# Patient Record
Sex: Male | Born: 1940 | Race: White | Hispanic: No | Marital: Married | Smoking: Former smoker
Health system: Southern US, Community
[De-identification: ages and names within clinical notes are randomized; demographics above are authoritative.]

## PROBLEM LIST (undated history)

## (undated) DIAGNOSIS — S81802A Unspecified open wound, left lower leg, initial encounter: Secondary | ICD-10-CM

## (undated) DIAGNOSIS — I10 Essential (primary) hypertension: Secondary | ICD-10-CM

## (undated) DIAGNOSIS — E785 Hyperlipidemia, unspecified: Secondary | ICD-10-CM

## (undated) DIAGNOSIS — J449 Chronic obstructive pulmonary disease, unspecified: Secondary | ICD-10-CM

## (undated) DIAGNOSIS — R0602 Shortness of breath: Secondary | ICD-10-CM

## (undated) DIAGNOSIS — D65 Disseminated intravascular coagulation [defibrination syndrome]: Secondary | ICD-10-CM

## (undated) DIAGNOSIS — I739 Peripheral vascular disease, unspecified: Secondary | ICD-10-CM

## (undated) DIAGNOSIS — I714 Abdominal aortic aneurysm, without rupture, unspecified: Secondary | ICD-10-CM

## (undated) DIAGNOSIS — J95821 Acute postprocedural respiratory failure: Secondary | ICD-10-CM

## (undated) HISTORY — DX: Essential (primary) hypertension: I10

## (undated) HISTORY — DX: Unspecified open wound, left lower leg, initial encounter: S81.802A

## (undated) HISTORY — DX: Hyperlipidemia, unspecified: E78.5

## (undated) HISTORY — DX: Acute postprocedural respiratory failure: J95.821

## (undated) HISTORY — PX: GANGLION CYST EXCISION: SHX1691

## (undated) HISTORY — DX: Abdominal aortic aneurysm, without rupture, unspecified: I71.40

## (undated) HISTORY — DX: Abdominal aortic aneurysm, without rupture: I71.4

## (undated) HISTORY — DX: Disseminated intravascular coagulation (defibrination syndrome): D65

---

## 2012-09-08 ENCOUNTER — Inpatient Hospital Stay (HOSPITAL_COMMUNITY)
Admission: EM | Admit: 2012-09-08 | Discharge: 2012-09-15 | DRG: 237 | Disposition: A | Discharge status: Rehabilitation Facility | Payer: Medicare Other | Attending: Vascular Surgery | Admitting: Vascular Surgery

## 2012-09-08 ENCOUNTER — Encounter (HOSPITAL_COMMUNITY): Admission: EM | Disposition: A | Discharge status: Rehabilitation Facility | Payer: Self-pay | Source: Home / Self Care

## 2012-09-08 ENCOUNTER — Encounter (HOSPITAL_COMMUNITY): Payer: Self-pay | Admitting: Vascular Surgery

## 2012-09-08 ENCOUNTER — Encounter (HOSPITAL_COMMUNITY): Payer: Self-pay | Admitting: Anesthesiology

## 2012-09-08 ENCOUNTER — Ambulatory Visit: Payer: Medicare Other | Admitting: Family Medicine

## 2012-09-08 ENCOUNTER — Emergency Department (HOSPITAL_COMMUNITY): Payer: Medicare Other

## 2012-09-08 ENCOUNTER — Inpatient Hospital Stay (HOSPITAL_COMMUNITY): Payer: Medicare Other

## 2012-09-08 ENCOUNTER — Emergency Department (HOSPITAL_COMMUNITY): Payer: Medicare Other | Admitting: Anesthesiology

## 2012-09-08 VITALS — BP 115/85 | HR 80

## 2012-09-08 DIAGNOSIS — Z87891 Personal history of nicotine dependence: Secondary | ICD-10-CM | POA: Insufficient documentation

## 2012-09-08 DIAGNOSIS — R9431 Abnormal electrocardiogram [ECG] [EKG]: Secondary | ICD-10-CM

## 2012-09-08 DIAGNOSIS — E876 Hypokalemia: Secondary | ICD-10-CM | POA: Diagnosis not present

## 2012-09-08 DIAGNOSIS — I713 Abdominal aortic aneurysm, ruptured, unspecified: Principal | ICD-10-CM | POA: Diagnosis present

## 2012-09-08 DIAGNOSIS — D62 Acute posthemorrhagic anemia: Secondary | ICD-10-CM | POA: Diagnosis not present

## 2012-09-08 DIAGNOSIS — J449 Chronic obstructive pulmonary disease, unspecified: Secondary | ICD-10-CM

## 2012-09-08 DIAGNOSIS — R5381 Other malaise: Secondary | ICD-10-CM | POA: Diagnosis not present

## 2012-09-08 DIAGNOSIS — Z720 Tobacco use: Secondary | ICD-10-CM

## 2012-09-08 DIAGNOSIS — R55 Syncope and collapse: Secondary | ICD-10-CM

## 2012-09-08 DIAGNOSIS — I743 Embolism and thrombosis of arteries of the lower extremities: Secondary | ICD-10-CM

## 2012-09-08 DIAGNOSIS — D696 Thrombocytopenia, unspecified: Secondary | ICD-10-CM | POA: Diagnosis not present

## 2012-09-08 DIAGNOSIS — J4489 Other specified chronic obstructive pulmonary disease: Secondary | ICD-10-CM | POA: Diagnosis present

## 2012-09-08 DIAGNOSIS — D65 Disseminated intravascular coagulation [defibrination syndrome]: Secondary | ICD-10-CM | POA: Diagnosis not present

## 2012-09-08 DIAGNOSIS — J95821 Acute postprocedural respiratory failure: Secondary | ICD-10-CM | POA: Diagnosis not present

## 2012-09-08 DIAGNOSIS — R7309 Other abnormal glucose: Secondary | ICD-10-CM | POA: Diagnosis not present

## 2012-09-08 DIAGNOSIS — F17200 Nicotine dependence, unspecified, uncomplicated: Secondary | ICD-10-CM | POA: Diagnosis present

## 2012-09-08 DIAGNOSIS — R739 Hyperglycemia, unspecified: Secondary | ICD-10-CM | POA: Diagnosis not present

## 2012-09-08 DIAGNOSIS — R109 Unspecified abdominal pain: Secondary | ICD-10-CM

## 2012-09-08 DIAGNOSIS — I959 Hypotension, unspecified: Secondary | ICD-10-CM

## 2012-09-08 DIAGNOSIS — I739 Peripheral vascular disease, unspecified: Secondary | ICD-10-CM | POA: Diagnosis present

## 2012-09-08 DIAGNOSIS — I10 Essential (primary) hypertension: Secondary | ICD-10-CM | POA: Diagnosis present

## 2012-09-08 HISTORY — PX: ABDOMINAL AORTIC ANEURYSM REPAIR: SHX42

## 2012-09-08 LAB — COMPREHENSIVE METABOLIC PANEL
ALT: 6 U/L (ref 0–53)
AST: 8 U/L (ref 0–37)
Albumin: 2.8 g/dL — ABNORMAL LOW (ref 3.5–5.2)
Alkaline Phosphatase: 47 U/L (ref 39–117)
BUN: 14 mg/dL (ref 6–23)
CO2: 21 mEq/L (ref 19–32)
Calcium: 7.9 mg/dL — ABNORMAL LOW (ref 8.4–10.5)
Chloride: 106 mEq/L (ref 96–112)
Creatinine, Ser: 0.74 mg/dL (ref 0.50–1.35)
GFR calc Af Amer: >90 mL/min (ref >90)
GFR calc non Af Amer: >90 mL/min (ref >90)
Glucose, Bld: 170 mg/dL — ABNORMAL HIGH (ref 70–99)
Potassium: 3.6 mEq/L (ref 3.5–5.1)
Sodium: 136 mEq/L (ref 135–145)
Total Bilirubin: 0.4 mg/dL (ref 0.3–1.2)
Total Protein: 4.9 g/dL — ABNORMAL LOW (ref 6.0–8.3)

## 2012-09-08 LAB — ABO/RH: ABO/RH(D): O POS

## 2012-09-08 LAB — POCT I-STAT 7, (LYTES, BLD GAS, ICA,H+H)
Acid-base deficit: 10 mmol/L — ABNORMAL HIGH (ref 0.0–2.0)
Acid-base deficit: 7 mmol/L — ABNORMAL HIGH (ref 0.0–2.0)
Bicarbonate: 17.1 mEq/L — ABNORMAL LOW (ref 20.0–24.0)
Bicarbonate: 20.3 mEq/L (ref 20.0–24.0)
Calcium, Ion: 0.94 mmol/L — ABNORMAL LOW (ref 1.13–1.30)
Calcium, Ion: 0.58 mmol/L — CL (ref 1.13–1.30)
HCT: 15 % — ABNORMAL LOW (ref 39.0–52.0)
HCT: 24 % — ABNORMAL LOW (ref 39.0–52.0)
Hemoglobin: 5.1 g/dL — CL (ref 13.0–17.0)
Hemoglobin: 8.2 g/dL — ABNORMAL LOW (ref 13.0–17.0)
O2 Saturation: 100 %
O2 Saturation: 100 %
Patient temperature: 33
Patient temperature: 33.2
Potassium: 3.9 mEq/L (ref 3.5–5.1)
Potassium: 4.7 mEq/L (ref 3.5–5.1)
Sodium: 137 mEq/L (ref 135–145)
Sodium: 140 mEq/L (ref 135–145)
TCO2: 18 mmol/L (ref 0–100)
TCO2: 22 mmol/L (ref 0–100)
pCO2 arterial: 36.7 mmHg (ref 35.0–45.0)
pCO2 arterial: 41.1 mmHg (ref 35.0–45.0)
pH, Arterial: 7.254 — ABNORMAL LOW (ref 7.350–7.450)
pH, Arterial: 7.282 — ABNORMAL LOW (ref 7.350–7.450)
pO2, Arterial: 336 mmHg — ABNORMAL HIGH (ref 80.0–100.0)
pO2, Arterial: 362 mmHg — ABNORMAL HIGH (ref 80.0–100.0)

## 2012-09-08 LAB — PROTIME-INR
INR: 1.43 (ref 0.00–1.49)
INR: 3.07 — ABNORMAL HIGH (ref 0.00–1.49)
Prothrombin Time: 17.1 seconds — ABNORMAL HIGH (ref 11.6–15.2)
Prothrombin Time: 30.1 seconds — ABNORMAL HIGH (ref 11.6–15.2)

## 2012-09-08 LAB — CBC WITH DIFFERENTIAL/PLATELET
Basophils Absolute: 0 10*3/uL (ref 0.0–0.1)
Basophils Relative: 0 % (ref 0–1)
Eosinophils Absolute: 0 10*3/uL (ref 0.0–0.7)
Eosinophils Relative: 0 % (ref 0–5)
HCT: 33.3 % — ABNORMAL LOW (ref 39.0–52.0)
Hemoglobin: 11.4 g/dL — ABNORMAL LOW (ref 13.0–17.0)
Lymphocytes Relative: 5 % — ABNORMAL LOW (ref 12–46)
Lymphs Abs: 1.3 10*3/uL (ref 0.7–4.0)
MCH: 32.9 pg (ref 26.0–34.0)
MCHC: 34.2 g/dL (ref 30.0–36.0)
MCV: 96 fL (ref 78.0–100.0)
Monocytes Absolute: 1 10*3/uL (ref 0.1–1.0)
Monocytes Relative: 4 % (ref 3–12)
Neutro Abs: 23.6 10*3/uL — ABNORMAL HIGH (ref 1.7–7.7)
Neutrophils Relative %: 91 % — ABNORMAL HIGH (ref 43–77)
Platelets: 160 10*3/uL (ref 150–400)
RBC: 3.47 MIL/uL — ABNORMAL LOW (ref 4.22–5.81)
RDW: 13.5 % (ref 11.5–15.5)
WBC: 25.9 10*3/uL — ABNORMAL HIGH (ref 4.0–10.5)

## 2012-09-08 LAB — POCT CBC
Granulocyte percent: 76.3 %G (ref 37–80)
HCT, POC: 43.4 % — AB (ref 43.5–53.7)
Hemoglobin: 13.6 g/dL — AB (ref 14.1–18.1)
Lymph, poc: 2.9 (ref 0.6–3.4)
MCH, POC: 31.2 pg (ref 27–31.2)
MCHC: 31.3 g/dL — AB (ref 31.8–35.4)
MCV: 99.6 fL — AB (ref 80–97)
MID (cbc): 1 — AB (ref 0–0.9)
MPV: 9.8 fL (ref 0–99.8)
POC Granulocyte: 12.6 — AB (ref 2–6.9)
POC LYMPH PERCENT: 17.6 %L (ref 10–50)
POC MID %: 6.1 %M (ref 0–12)
Platelet Count, POC: 220 10*3/uL (ref 142–424)
RBC: 4.36 M/uL — AB (ref 4.69–6.13)
RDW, POC: 14.3 %
WBC: 16.5 10*3/uL — AB (ref 4.6–10.2)

## 2012-09-08 LAB — LACTIC ACID, PLASMA: Lactic Acid, Venous: 3.5 mmol/L — ABNORMAL HIGH (ref 0.5–2.2)

## 2012-09-08 LAB — PLATELET COUNT: Platelets: 55 10*3/uL — ABNORMAL LOW (ref 150–400)

## 2012-09-08 LAB — GLUCOSE, POCT (MANUAL RESULT ENTRY): POC Glucose: 186 mg/dl — AB (ref 70–99)

## 2012-09-08 LAB — APTT
aPTT: 35 seconds (ref 24–37)
aPTT: 83 seconds — ABNORMAL HIGH (ref 24–37)

## 2012-09-08 LAB — POCT I-STAT TROPONIN I: Troponin i, poc: 0.02 ng/mL (ref 0.00–0.08)

## 2012-09-08 LAB — LIPASE, BLOOD: Lipase: 15 U/L (ref 11–59)

## 2012-09-08 LAB — PREPARE RBC (CROSSMATCH)

## 2012-09-08 LAB — FIBRINOGEN: Fibrinogen: 85 mg/dL — CL (ref 204–475)

## 2012-09-08 SURGERY — ANEURYSM ABDOMINAL AORTIC REPAIR
Anesthesia: General | Site: Abdomen | Wound class: Clean

## 2012-09-08 MED ORDER — HYDROMORPHONE HCL PF 1 MG/ML IJ SOLN
0.5000 mg | INTRAMUSCULAR | Status: DC | PRN
  Administered 2012-09-08: 0.5 mg via INTRAVENOUS
  Filled 2012-09-08: qty 1

## 2012-09-08 MED ORDER — MANNITOL 25 % IV SOLN
INTRAVENOUS | Status: DC | PRN
  Administered 2012-09-08: 25 g via INTRAVENOUS

## 2012-09-08 MED ORDER — ARTIFICIAL TEARS OP OINT
TOPICAL_OINTMENT | OPHTHALMIC | Status: DC | PRN
  Administered 2012-09-08: 1 via OPHTHALMIC

## 2012-09-08 MED ORDER — ONDANSETRON HCL 4 MG/2ML IJ SOLN
4.0000 mg | Freq: Once | INTRAMUSCULAR | Status: AC
  Administered 2012-09-08: 4 mg via INTRAVENOUS
  Filled 2012-09-08: qty 2

## 2012-09-08 MED ORDER — MIDAZOLAM HCL 5 MG/5ML IJ SOLN
INTRAMUSCULAR | Status: DC | PRN
  Administered 2012-09-08: 4 mg via INTRAVENOUS
  Administered 2012-09-08: 2 mg via INTRAVENOUS

## 2012-09-08 MED ORDER — FENTANYL CITRATE 0.05 MG/ML IJ SOLN
INTRAMUSCULAR | Status: DC | PRN
  Administered 2012-09-08: 150 ug via INTRAVENOUS
  Administered 2012-09-08: 100 ug via INTRAVENOUS
  Administered 2012-09-08 (×3): 250 ug via INTRAVENOUS
  Administered 2012-09-08: 150 ug via INTRAVENOUS
  Administered 2012-09-08: 250 ug via INTRAVENOUS
  Administered 2012-09-08: 100 ug via INTRAVENOUS

## 2012-09-08 MED ORDER — ROCURONIUM BROMIDE 100 MG/10ML IV SOLN
INTRAVENOUS | Status: DC | PRN
  Administered 2012-09-08: 20 mg via INTRAVENOUS
  Administered 2012-09-08: 30 mg via INTRAVENOUS

## 2012-09-08 MED ORDER — ALBUMIN HUMAN 5 % IV SOLN
INTRAVENOUS | Status: DC | PRN
  Administered 2012-09-08: 20:00:00 via INTRAVENOUS

## 2012-09-08 MED ORDER — SUCCINYLCHOLINE CHLORIDE 20 MG/ML IJ SOLN
INTRAMUSCULAR | Status: DC | PRN
  Administered 2012-09-08: 120 mg via INTRAVENOUS

## 2012-09-08 MED ORDER — LACTATED RINGERS IV SOLN
INTRAVENOUS | Status: DC | PRN
  Administered 2012-09-08: 20:00:00 via INTRAVENOUS

## 2012-09-08 MED ORDER — SODIUM CHLORIDE 0.9 % IV SOLN
1000.0000 mL | INTRAVENOUS | Status: DC
  Administered 2012-09-08: 1000 mL via INTRAVENOUS

## 2012-09-08 MED ORDER — SODIUM BICARBONATE 8.4 % IV SOLN
INTRAVENOUS | Status: DC | PRN
  Administered 2012-09-08: 100 meq via INTRAVENOUS
  Administered 2012-09-08 (×2): 50 meq via INTRAVENOUS
  Administered 2012-09-08: 100 meq via INTRAVENOUS

## 2012-09-08 MED ORDER — LIDOCAINE HCL (CARDIAC) 20 MG/ML IV SOLN
INTRAVENOUS | Status: DC | PRN
  Administered 2012-09-08: 100 mg via INTRAVENOUS

## 2012-09-08 MED ORDER — SODIUM CHLORIDE 0.9 % IR SOLN
Status: DC | PRN
  Administered 2012-09-08: 21:00:00

## 2012-09-08 MED ORDER — 0.9 % SODIUM CHLORIDE (POUR BTL) OPTIME
TOPICAL | Status: DC | PRN
  Administered 2012-09-08: 1000 mL

## 2012-09-08 MED ORDER — VECURONIUM BROMIDE 10 MG IV SOLR
INTRAVENOUS | Status: DC | PRN
  Administered 2012-09-08 (×2): 10 mg via INTRAVENOUS

## 2012-09-08 MED ORDER — MANNITOL 25 % IV SOLN
25.0000 g | INTRAVENOUS | Status: AC
  Administered 2012-09-08: 25 g via INTRAVENOUS
  Filled 2012-09-08: qty 100

## 2012-09-08 MED ORDER — CALCIUM CHLORIDE 10 % IV SOLN
INTRAVENOUS | Status: DC | PRN
  Administered 2012-09-08 (×2): 1 g via INTRAVENOUS
  Administered 2012-09-08: 2 g via INTRAVENOUS

## 2012-09-08 MED ORDER — PROPOFOL 10 MG/ML IV BOLUS
INTRAVENOUS | Status: DC | PRN
  Administered 2012-09-08 (×2): 200 mg via INTRAVENOUS
  Administered 2012-09-08 (×2): 100 mg via INTRAVENOUS

## 2012-09-08 MED ORDER — ALBUMIN HUMAN 5 % IV SOLN: INTRAVENOUS | Status: DC | PRN

## 2012-09-08 MED ORDER — SODIUM CHLORIDE 0.9 % IV SOLN
INTRAVENOUS | Status: DC | PRN
  Administered 2012-09-08: 21:00:00 via INTRAVENOUS

## 2012-09-08 MED ORDER — ETOMIDATE 2 MG/ML IV SOLN
INTRAVENOUS | Status: DC | PRN
  Administered 2012-09-08: 14 mg via INTRAVENOUS

## 2012-09-08 MED ORDER — PHENYLEPHRINE HCL 10 MG/ML IJ SOLN
10.0000 mg | INTRAVENOUS | Status: DC | PRN
  Administered 2012-09-08 (×2): 10 ug/min via INTRAVENOUS

## 2012-09-08 SURGICAL SUPPLY — 87 items
ATTRACTOMAT 16X20 MAGNETIC DRP (DRAPES) ×3 IMPLANT
BALLN CODA OCL 2-9.0-35-120-3 (BALLOONS)
BALLOON COD OCL 2-9.0-35-120-3 (BALLOONS) IMPLANT
BLADE SURG ROTATE 9660 (MISCELLANEOUS) ×3 IMPLANT
CANISTER SUCTION 2500CC (MISCELLANEOUS) ×3 IMPLANT
CATH EMB 4FR 80CM (CATHETERS) ×3 IMPLANT
CATH EMB 5FR 80CM (CATHETERS) ×3 IMPLANT
CLIP TI MEDIUM 24 (CLIP) ×3 IMPLANT
CLIP TI WIDE RED SMALL 24 (CLIP) ×3 IMPLANT
CLOTH BEACON ORANGE TIMEOUT ST (SAFETY) ×3 IMPLANT
COVER MAYO STAND STRL (DRAPES) ×3 IMPLANT
COVER SURGICAL LIGHT HANDLE (MISCELLANEOUS) ×3 IMPLANT
DERMABOND ADVANCED (GAUZE/BANDAGES/DRESSINGS) ×1
DERMABOND ADVANCED .7 DNX12 (GAUZE/BANDAGES/DRESSINGS) ×2 IMPLANT
DRAIN CHANNEL 10F 3/8 F FF (DRAIN) IMPLANT
DRAIN CHANNEL 10M FLAT 3/4 FLT (DRAIN) IMPLANT
DRAPE TABLE COVER HEAVY DUTY (DRAPES) ×3 IMPLANT
DRAPE WARM FLUID 44X44 (DRAPE) ×3 IMPLANT
DRSG COVADERM 4X14 (GAUZE/BANDAGES/DRESSINGS) ×3 IMPLANT
ELECT BLADE 6.5 EXT (BLADE) IMPLANT
ELECT CAUTERY BLADE 6.4 (BLADE) ×6 IMPLANT
ELECT REM PT RETURN 9FT ADLT (ELECTROSURGICAL) ×6
ELECTRODE REM PT RTRN 9FT ADLT (ELECTROSURGICAL) ×4 IMPLANT
EVACUATOR 3/16  PVC DRAIN (DRAIN)
EVACUATOR 3/16 PVC DRAIN (DRAIN) IMPLANT
EVACUATOR SILICONE 100CC (DRAIN) IMPLANT
FELT TEFLON 4 X1 (Mesh General) ×3 IMPLANT
GLOVE SS BIOGEL STRL SZ 7 (GLOVE) ×4 IMPLANT
GLOVE SUPERSENSE BIOGEL SZ 7 (GLOVE) ×2
GOWN STRL NON-REIN LRG LVL3 (GOWN DISPOSABLE) ×21 IMPLANT
GRAFT HEMASHIELD 16X8MM (Vascular Products) ×3 IMPLANT
INSERT FOGARTY 61MM (MISCELLANEOUS) ×3 IMPLANT
INSERT FOGARTY SM (MISCELLANEOUS) ×6 IMPLANT
KIT BASIN OR (CUSTOM PROCEDURE TRAY) ×3 IMPLANT
KIT ROOM TURNOVER OR (KITS) ×3 IMPLANT
LOOP VESSEL MAXI BLUE (MISCELLANEOUS) IMPLANT
LOOP VESSEL MINI RED (MISCELLANEOUS) IMPLANT
NEEDLE PERC 18GX7CM (NEEDLE) IMPLANT
NS IRRIG 1000ML POUR BTL (IV SOLUTION) ×6 IMPLANT
PACK AORTA (CUSTOM PROCEDURE TRAY) ×3 IMPLANT
PAD ARMBOARD 7.5X6 YLW CONV (MISCELLANEOUS) ×6 IMPLANT
PENCIL BUTTON HOLSTER BLD 10FT (ELECTRODE) ×6 IMPLANT
RETAINER VISCERA MED (MISCELLANEOUS) ×3 IMPLANT
SPECIMEN JAR MEDIUM (MISCELLANEOUS) ×3 IMPLANT
SPONGE GAUZE 4X4 12PLY (GAUZE/BANDAGES/DRESSINGS) ×3 IMPLANT
SPONGE LAP 18X18 X RAY DECT (DISPOSABLE) ×3 IMPLANT
SPONGE LAP 4X18 X RAY DECT (DISPOSABLE) ×6 IMPLANT
STAPLER VISISTAT 35W (STAPLE) ×6 IMPLANT
STOPCOCK MORSE 400PSI 3WAY (MISCELLANEOUS) IMPLANT
SUT ETHIBOND 5 LR DA (SUTURE) IMPLANT
SUT ETHILON 3 0 PS 1 (SUTURE) IMPLANT
SUT PROLENE 1 XLH 60 (SUTURE) ×6 IMPLANT
SUT PROLENE 3 0 SH1 36 (SUTURE) ×12 IMPLANT
SUT PROLENE 4 0 RB 1 (SUTURE) ×3
SUT PROLENE 4-0 RB1 .5 CRCL 36 (SUTURE) ×2 IMPLANT
SUT PROLENE 5 0 C 1 24 (SUTURE) IMPLANT
SUT PROLENE 5 0 CC 1 (SUTURE) ×6 IMPLANT
SUT PROLENE 5 0 CC1 (SUTURE) ×3 IMPLANT
SUT PROLENE 6 0 C 1 30 (SUTURE) ×3 IMPLANT
SUT SILK 2 0 (SUTURE) ×3
SUT SILK 2 0 SH CR/8 (SUTURE) ×6 IMPLANT
SUT SILK 2-0 18XBRD TIE 12 (SUTURE) ×2 IMPLANT
SUT SILK 3 0 (SUTURE) ×3
SUT SILK 3 0 SH CR/8 (SUTURE) IMPLANT
SUT SILK 3 0 TIES 10X30 (SUTURE) ×3 IMPLANT
SUT SILK 3-0 18XBRD TIE 12 (SUTURE) ×2 IMPLANT
SUT SILK 4 0 (SUTURE) ×3
SUT SILK 4-0 18XBRD TIE 12 (SUTURE) ×2 IMPLANT
SUT VIC AB 2-0 CT1 27 (SUTURE)
SUT VIC AB 2-0 CT1 TAPERPNT 27 (SUTURE) IMPLANT
SUT VIC AB 2-0 CTX 36 (SUTURE) IMPLANT
SUT VIC AB 3-0 MH 27 (SUTURE) ×12 IMPLANT
SUT VIC AB 3-0 SH 27 (SUTURE)
SUT VIC AB 3-0 SH 27X BRD (SUTURE) IMPLANT
SUT VICRYL 4-0 PS2 18IN ABS (SUTURE) ×6 IMPLANT
SYR 20CC LL (SYRINGE) ×6 IMPLANT
SYR 30ML LL (SYRINGE) IMPLANT
SYR 3ML LL SCALE MARK (SYRINGE) ×6 IMPLANT
SYR 5ML LL (SYRINGE) IMPLANT
SYR MEDRAD MARK V 150ML (SYRINGE) IMPLANT
SYRINGE 10CC LL (SYRINGE) ×6 IMPLANT
TOWEL OR 17X24 6PK STRL BLUE (TOWEL DISPOSABLE) ×6 IMPLANT
TOWEL OR 17X26 10 PK STRL BLUE (TOWEL DISPOSABLE) ×6 IMPLANT
TRAY FOLEY CATH 14FRSI W/METER (CATHETERS) ×3 IMPLANT
TUBING HIGH PRESSURE 120CM (CONNECTOR) IMPLANT
WATER STERILE IRR 1000ML POUR (IV SOLUTION) ×6 IMPLANT
YANKAUER SUCT BULB TIP NO VENT (SUCTIONS) ×3 IMPLANT

## 2012-09-08 NOTE — ED Provider Notes (Signed)
I saw and evaluated the patient, reviewed the resident's note and I agree with the findings and plan. EKG Rate 78 ACCELERATED JUNCTIONAL RHYTHM ~ absent P waves, accele'd V-rate, artifact RIGHT BUNDLE BRANCH BLOCK ~ QRSd>120, terminal axis(90,270) ARTIFACT IN LEAD(S) I,II,III,aVR,aVL,aVF,V2,V6  Pt presented with abdominal pain and syncope.  Sent from urgent care for concerns of a ruptured AAA. At the bedside the patient was hypotensive. His symptoms were very consistent and concerning for a ruptured AAA. Bedside ultrasound test performed suggested an abdominal aortic aneurysm. Vascular surgery was consulted emergently. A noncontrast CT was ordered per the request of the patient was brought to the operating room for further treatment.    Findings were discussed with the patient and family.  CRITICAL CARE Performed by: Celene Kras   Total critical care time: 35 Critical care time was exclusive of separately billable procedures and treating other patients.  Critical care was necessary to treat or prevent imminent or life-threatening deterioration.  Critical care was time spent personally by me on the following activities: development of treatment plan with patient and/or surrogate as well as nursing, discussions with consultants, evaluation of patient's response to treatment, examination of patient, obtaining history from patient or surrogate, ordering and performing treatments and interventions, ordering and review of laboratory studies, ordering and review of radiographic studies, pulse oximetry and re-evaluation of patient's condition.   Celene Kras, MD 09/08/12 [phone]

## 2012-09-08 NOTE — Op Note (Signed)
OPERATIVE REPORT  Date of Surgery: 09/08/2012  Surgeon: Josephina Gip, MD  Assistant: Della Goo pa  Pre-op Diagnosis: AAA- Ruptured  Post-op Diagnosis: Same  Procedure: Procedure(s): Resection and grafting of ruptured abdominal aortic aneurysm with insertion of an aorto by common iliac graft using Hemashield Dacron graft Bilateral Fogarty thrombectomy of the iliac system Anesthesia: General  EBL:  5000 cc  Complications: None  Patient was taken to the operating room placed in supine position urgently from the CT scanning room in radiology. He was found to have a ruptured abdominal aortic aneurysm is not a candidate 4 aortic stent grafting. Radial arterial line and central line were inserted by anesthesia. The abdomen and groins were prepped Betadine scrub and solution draped in routine sterile manner with the patient awake. Blood pressure was in the 100 systolic range and anesthesia was induced. A midline incision was made from xiphoid to pubis carried down to subcutaneous tissue and linea alba using the Bovie. The peritoneal cavity was entered and thoroughly explored. There was blood in the free peritoneal cavity but the majority of the hematoma was contained in the retroperitoneum. There was a massive retroperitoneal hematoma overlying a 6 cm aortic aneurysm. Initially control of the aorta and just distal to the diaphragm was obtained by bluntly dissecting the aorta and then placing an aortic clamp proximal to the celiac axis. Following this the retroperitoneum was opened and the aneurysm exposed. There was a large rupture in the aneurysm anteriorly near the neck which was quite short. 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 Clamp was placed just distal to the renal arteries following this both common iliac arteries were dissected free. On the right side there was a significant aneurysm and iliac artery which extended down to and involved the internal iliac. On the left side the common iliac was a  more normal-sized vessel. No heparin was given. Patient was given blood transfusions as well as fresh frozen plasma and Cell Saver blood throughout the procedure and also platelet transfusion, the clamp was moved to the infrarenal position and the proximal clamp removed after about 15 minutes of supra-renal clamp time. There is a large hole in the anterior aspect of aneurysm near the neck. Aneurysm was opened anteriorly and a large amount of laminated thrombus removed. The left common iliac artery was transected and was relatively smooth vessel. 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 A 16 x 8 mm Hemashield Dacron graft was anastomosed end to end to the aortic stump using continuous 3-0 Prolene buttressing this with a strip of felt. There was a leak in the posterior aspect which required 2 pledgeted sutures of 3-0 Prolene to repair. This satisfactorily repaired the leak. Beginning on the right side the external iliac was thrombectomized using a Fogarty catheter which was passed distally and easily traversed the entire leg. Some thrombus was removed from the iliac system but not the distal vessels. In the anastomosis was done between the right limb of the graft and the external iliac on the right with 5-0 Prolene. Right leg was open with no significant hypotension. On the left side the graft was anastomosed to the common iliac artery and similarly a Fogarty thrombectomy was performed and some thrombus removed from the iliac system. Following completion of the left leg and opening with no significant hypotension we observed the retroperitoneal space. There was satisfactory bleeding coming from the inferior mesenteric artery. Patient was given  6 units of fresh frozen plasma as well as 2 platelet transfusions throughout the case. He was oozing diffusely in the retroperitoneal space there were no specific areas to  control. Following this aneurysm was closed over the graft with 3-0 Vicryl retroperitoneum reapproximated with 3-0 Vicryl linea alba closed with #1 Prolene and skin with staples sterile dressing applied patient taken to the surgical intensive care unit in critical condition on the ventilator He received 10 units of packed red blood cells, 6 units fresh frozen plasma, to platelet transfusions, and estimated blood loss was 5000 cc's  inferior Procedure Details:   Josephina Gip, MD 09/08/2012 11:49 PM

## 2012-09-08 NOTE — Progress Notes (Signed)
  Subjective:    Patient ID: Brandon [name], male    DOB: 04/07/1941, 71 y.o.   MRN: [id number]  HPI Brandon [name] is a 71 y.o. male pulled emergently to waiting room due to collapse/syncope.  Patient brought emergently to room 6  in wheelchair - responsive at initial eval, but gray in appearance, diaphoretic.  Per wife - c/o of abdominal pain into back today with 3 episodes of syncope, and has been short of breath.  No chest pain.  No known hx of htn or lung disease, but no regular medical care.  Smokes tobacco, no meds.   Review of Systems As above.     Objective:   Physical Exam  Vitals reviewed. multiple providers involved in care. initial BP - 87/57 with O2 sat 86% on RA, placed on monitor, sinus rhythm. ., o2 [place] at 2 liters o2 sat up to 94% 2L [place].  peripheral iv's placed - 18 gauge, with NS running wide open. Legs elevated. EMS called for emergent transport.  Peripheral pulses palpable. Distant cardiac sounds, but regular rate - approximately 80. Lungs grossly clear on exam.   Abdomen appeared protuberant with easily palpable abdominal aortic pulse - subjectively widely palpable.  Umbilical hernia.  Repeat BP: 100/73, then 115/85 at 1735. EMS on scene. Advised EDP at [facility] of patient's condition and concern of possible AAA.  CBC resulted: Results for orders placed in visit on 09/08/12  POCT CBC      Component Value Range   WBC 16.5 (*) 4.6 - 10.2 K/uL   Lymph, poc 2.9  0.6 - 3.4   POC LYMPH PERCENT 17.6  10 - 50 %L   MID (cbc) 1.0 (*) 0 - 0.9   POC MID % 6.1  0 - 12 %M   POC Granulocyte 12.6 (*) 2 - 6.9   Granulocyte percent 76.3  37 - 80 %G   RBC 4.36 (*) 4.69 - 6.13 M/uL   Hemoglobin 13.6 (*) 14.1 - 18.1 g/dL   HCT, POC 40.9 (*) 81.1 - 53.7 %   MCV 99.6 (*) 80 - 97 fL   MCH, POC 31.2  27 - 31.2 pg   MCHC 31.3 (*) 31.8 - 35.4 g/dL   RDW, POC 91.4     Platelet Count, POC 220  142 - 424 K/uL   MPV 9.8  0 - 99.8 fL  GLUCOSE, POCT (MANUAL RESULT ENTRY)      Component Value  Range   POC Glucose 186 (*) 70 - 99 mg/dl   EKG: RBBB - rate 74.      Assessment & Plan:  Brandon [name] is a 71 y.o. male  1. Abdominal pain  POCT CBC, POCT glucose (manual entry), EKG 12-Lead  2. Syncope  POCT CBC, POCT glucose (manual entry), EKG 12-Lead  3. Hypotension  POCT CBC, POCT glucose (manual entry), EKG 12-Lead  4. Abnormal EKG  POCT CBC, POCT glucose (manual entry), EKG 12-Lead   As above - sent to [facility] ER by EMS.  Advised EDP of bloodwork and improvement of BP/ status.

## 2012-09-08 NOTE — ED Notes (Signed)
Pt arrives to the ED from [facility] via [facility]. For rule out AAA. Reports that he has left lower quadrant abdominal pain that radiates into the left lower back. Pt was in 2nd degree HB on EKG from [facility]. Pt also reports 2 syncopal episodes and is diaphoretic. Pt is on 2 L and has two 18 gauge IVs.

## 2012-09-08 NOTE — ED Provider Notes (Signed)
History     CSN: [id number]  Arrival date & time 09/08/12  1751   First MD Initiated Contact with Patient 09/08/12 1831      Chief Complaint  Patient presents with  . Abdominal Pain    (Consider location/radiation/quality/duration/timing/severity/associated sxs/prior treatment) HPI Abdominal pain. Sent from urgent care with concern for possible AAA.  Pain described as severe in intensity. The location of the patient's problem is abdomen with radiation to his back.  Onset was abruptly this afternoon around 3 PM with unchanged course since that time.   Modifying factors:  Worse with movement, palpation.  Associated symptoms: syncope x2. No chest pain.  History reviewed. No pertinent past medical history.  History reviewed. No pertinent past surgical history.  No family history on file.  History  Substance Use Topics  . Smoking status: Current Every Day Smoker -- 1.0 packs/day  . Smokeless tobacco: Not on file  . Alcohol Use: No      Review of Systems Negative for respiratory distress, cough. Negative for vomiting, diarrhea.  All other systems reviewed and negative unless noted in HPI.    Allergies  Review of patient's allergies indicates no known allergies.  Home Medications   Current Outpatient Rx  Name Route Sig Dispense Refill  . IBUPROFEN 200 MG PO TABS Oral Take 400 mg by mouth every morning.      BP 96/76  Pulse 76  Temp 96.4 F (35.8 C)  Resp 21  SpO2 95%  Physical Exam Nursing note and vitals reviewed.  Constitutional: Pt is alert and appears stated age. Oropharynx: Airway open without erythema or exudate. Respiratory: No respiratory distress. Equal breathing bilaterally. CV: Extremities warm and well perfused. Neuro: No motor nor sensory deficit. Head: Normocephalic and atraumatic. Eyes: No conjunctivitis, no scleral icterus. Neck: Supple, no mass. Chest: Non-tender. Abdomen: Distended, severe tenderness.  MSK: Extremities are atraumatic  without deformity. Skin: No rash, no wounds.  ED Course  Procedures (including critical care time)  Labs Reviewed  COMPREHENSIVE METABOLIC PANEL - Abnormal; Notable for the following:    Glucose, Bld 170 (*)     Calcium 7.9 (*)     Total Protein 4.9 (*)     Albumin 2.8 (*)     All other components within normal limits  CBC WITH DIFFERENTIAL - Abnormal; Notable for the following:    WBC 25.9 (*)     RBC 3.47 (*)     Hemoglobin 11.4 (*)     HCT 33.3 (*)     Neutrophils Relative 91 (*)     Lymphocytes Relative 5 (*)     Neutro Abs 23.6 (*)     All other components within normal limits  PROTIME-INR - Abnormal; Notable for the following:    Prothrombin Time 17.1 (*)     All other components within normal limits  LACTIC ACID, PLASMA - Abnormal; Notable for the following:    Lactic Acid, Venous 3.5 (*)     All other components within normal limits  FIBRINOGEN - Abnormal; Notable for the following:    Fibrinogen 85 (*)     All other components within normal limits  PLATELET COUNT - Abnormal; Notable for the following:    Platelets 55 (*)  PLATELET COUNT CONFIRMED BY SMEAR   All other components within normal limits  PROTIME-INR - Abnormal; Notable for the following:    Prothrombin Time 30.1 (*)     INR 3.07 (*)     All other components within normal limits  APTT - Abnormal; Notable for the following:    aPTT 83 (*)     All other components within normal limits  POCT I-STAT 7, (LYTES, BLD GAS, ICA,H+H) - Abnormal; Notable for the following:    pH, Arterial 7.254 (*)     pO2, Arterial 336.0 (*)     Bicarbonate 17.1 (*)     Acid-base deficit 10.0 (*)     Calcium, Ion 0.94 (*)     HCT 15.0 (*)     Hemoglobin 5.1 (*)     All other components within normal limits  POCT I-STAT 7, (LYTES, BLD GAS, ICA,H+H) - Abnormal; Notable for the following:    pH, Arterial 7.282 (*)     pO2, Arterial 362.0 (*)     Acid-base deficit 7.0 (*)     Calcium, Ion 0.58 (*)     HCT 24.0 (*)      Hemoglobin 8.2 (*)     All other components within normal limits  LIPASE, BLOOD  TYPE AND SCREEN  APTT  PREPARE RBC (CROSSMATCH)  POCT I-STAT TROPONIN I  ABO/RH  PREPARE FRESH FROZEN PLASMA  PREPARE PLATELET PHERESIS  URINALYSIS, ROUTINE W REFLEX MICROSCOPIC   Ct Abdomen Pelvis Wo Contrast  09/08/2012  *RADIOLOGY REPORT*  Clinical Data: Abdominal pain  CT ABDOMEN AND PELVIS WITHOUT CONTRAST  Technique:  Multidetector CT imaging of the abdomen and pelvis was performed following the standard protocol without intravenous contrast.  Comparison: None.  Findings: There is subsegmental atelectasis noted in both lung bases.  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 Evidence of diffuse retroperitoneal hemorrhage is identified which is uplifting and displacing the pancreas, duodenum and left kidney. Hemorrhage extends into bilateral perinephric space is normal along the ventral surface of the left iliopsoas muscle into the superior portion of the pelvis.  There is a small amount of hemoperitoneum is noted within the dependent portion of the pelvis.  No focal liver abnormality.  Gallbladder appears normal.  No biliary dilatation.  Normal appearance of the pancreas.  The spleen is negative.  The adrenal glands are both normal.   The urinary bladder appears normal.  There is a low density cyst within the upper pole the right kidney measuring 1.2 cm. Incompletely characterized without IV contrast.  No focal left kidney abnormality.  The patient has a hiatal.  There is a small hiatal hernia.  The small bowel loops have a normal caliber.  The appendix is visualized and appears normal.  There is collapse of the proximal colon.  The mid and distal colon appear normal.  Review of the visualized osseous structures is unremarkable.  The patient has a small umbilical hernia which contains fat only. Bilateral fat containing inguinal hernias are also noted.  IMPRESSION:  1.  Ruptured 8.9 cm infrarenal  abdominal aortic aneurysm.  There is acute hemorrhage throughout the retroperitoneum which is displacing the small bowel loops and left kidney. 2.  Small amount of hemoperitoneum is noted within the dependent portion of the pelvis.  Critical Value/emergent results were called by telephone at the time of interpretation on 09/08/2012 at 7:42 p.m. to Dr. Kellie Simmering, who verbally acknowledged these results.   Original Report Authenticated By: Angelita Ingles, M.D.    Dg Chest Portable 1 View  09/08/2012  *RADIOLOGY REPORT*  Clinical Data: Preop AAA  PORTABLE CHEST - 1 VIEW  Comparison: None.  Findings: Lungs are essentially clear. No pleural effusion or pneumothorax.  Cardiomediastinal silhouette is within normal limits.  IMPRESSION: No evidence of acute cardiopulmonary disease.   Original Report Authenticated By: Julian Hy, M.D.    Dg Abd Portable 1v  09/08/2012  *RADIOLOGY REPORT*  Clinical Data: AAA repair, postop count film  PORTABLE ABDOMEN - 1 VIEW  Comparison: CT abdomen pelvis dated 09/08/2012 at 1911 hours.  Findings: No radiopaque foreign body is seen.  Midline skin staples.  IMPRESSION: No radiopaque foreign body is seen.   Original Report Authenticated By: Julian Hy, M.D.      1. Abdominal aortic aneurysm rupture       MDM  71 y.o. male here with abdominal pain. Vitals concerning for hypotension. Given pt age and smoking status concerned about AAA. Went to bedside immediately for abdominal ultrasound. Findings c/w AAA. Pt alert, oriented, not tachycardic. Pt with two large bore IVs. Vascular paged stat. Discussed with Dr. Kellie Simmering. Plan for CT abd/pelvis wo contrast and to OR. Orders placed by Dr. Tomi Bamberger. Wife and family at bedside updated on critical diagnosis and provisional treatment plan. Counseling provided.  Went to CT scan with patient for monitoring until Dr. Kellie Simmering arrived.   Medications/interventions:  IV fluid. Cardiac monitoring.  Data reviewed: Lab tests ordered  and reviewed by me: CBC with hgb okay at 11.4.  I independently viewed the following imaging studies and reviewed radiology's interpretation as summarized: CXR with no acute disease.   Medical Decision Making discussed with ED attending Kathalene Frames, MD          Dayton Scrape, MD 09/09/12 [phone]

## 2012-09-08 NOTE — ED Notes (Signed)
EDP and resident at bedside. Made aware of pt status.

## 2012-09-08 NOTE — ED Notes (Signed)
Pt in CT at this time with 2 RNs and Dr. Hart Rochester in CT room as well as ED resident in CT as well. OR called and informed of pt status. Family sent to OR waiting and was informed of plan of care

## 2012-09-09 ENCOUNTER — Inpatient Hospital Stay (HOSPITAL_COMMUNITY): Payer: Medicare Other

## 2012-09-09 ENCOUNTER — Encounter (HOSPITAL_COMMUNITY): Payer: Self-pay | Admitting: Anesthesiology

## 2012-09-09 DIAGNOSIS — I10 Essential (primary) hypertension: Secondary | ICD-10-CM | POA: Diagnosis not present

## 2012-09-09 DIAGNOSIS — Z9911 Dependence on respirator [ventilator] status: Secondary | ICD-10-CM

## 2012-09-09 DIAGNOSIS — F17200 Nicotine dependence, unspecified, uncomplicated: Secondary | ICD-10-CM

## 2012-09-09 DIAGNOSIS — J95821 Acute postprocedural respiratory failure: Secondary | ICD-10-CM

## 2012-09-09 DIAGNOSIS — R55 Syncope and collapse: Secondary | ICD-10-CM

## 2012-09-09 DIAGNOSIS — R739 Hyperglycemia, unspecified: Secondary | ICD-10-CM | POA: Diagnosis not present

## 2012-09-09 DIAGNOSIS — D65 Disseminated intravascular coagulation [defibrination syndrome]: Secondary | ICD-10-CM | POA: Diagnosis not present

## 2012-09-09 DIAGNOSIS — Z9889 Other specified postprocedural states: Secondary | ICD-10-CM

## 2012-09-09 DIAGNOSIS — I713 Abdominal aortic aneurysm, ruptured, unspecified: Principal | ICD-10-CM | POA: Diagnosis present

## 2012-09-09 HISTORY — DX: Disseminated intravascular coagulation (defibrination syndrome): D65

## 2012-09-09 HISTORY — DX: Acute postprocedural respiratory failure: J95.821

## 2012-09-09 LAB — POCT I-STAT 7, (LYTES, BLD GAS, ICA,H+H)
Acid-base deficit: 7 mmol/L — ABNORMAL HIGH (ref 0.0–2.0)
Bicarbonate: 21.9 mEq/L (ref 20.0–24.0)
Calcium, Ion: 0.8 mmol/L — ABNORMAL LOW (ref 1.13–1.30)
HCT: 26 % — ABNORMAL LOW (ref 39.0–52.0)
Hemoglobin: 8.8 g/dL — ABNORMAL LOW (ref 13.0–17.0)
O2 Saturation: 100 %
Patient temperature: 33.1
Potassium: 5.6 mEq/L — ABNORMAL HIGH (ref 3.5–5.1)
Sodium: 140 mEq/L (ref 135–145)
TCO2: 24 mmol/L (ref 0–100)
pCO2 arterial: 51.2 mmHg — ABNORMAL HIGH (ref 35.0–45.0)
pH, Arterial: 7.217 — ABNORMAL LOW (ref 7.350–7.450)
pO2, Arterial: 224 mmHg — ABNORMAL HIGH (ref 80.0–100.0)

## 2012-09-09 LAB — POCT I-STAT 3, ART BLOOD GAS (G3+)
Acid-Base Excess: 2 mmol/L (ref 0.0–2.0)
Acid-Base Excess: 1 mmol/L (ref 0.0–2.0)
Acid-Base Excess: 4 mmol/L — ABNORMAL HIGH (ref 0.0–2.0)
Bicarbonate: 29.4 mEq/L — ABNORMAL HIGH (ref 20.0–24.0)
Bicarbonate: 28.4 mEq/L — ABNORMAL HIGH (ref 20.0–24.0)
Bicarbonate: 29.9 mEq/L — ABNORMAL HIGH (ref 20.0–24.0)
O2 Saturation: 94 %
O2 Saturation: 99 %
O2 Saturation: 98 %
Patient temperature: 96
Patient temperature: 94.2
Patient temperature: 98.7
TCO2: 31 mmol/L (ref 0–100)
TCO2: 30 mmol/L (ref 0–100)
TCO2: 32 mmol/L (ref 0–100)
pCO2 arterial: 55.3 mmHg — ABNORMAL HIGH (ref 35.0–45.0)
pCO2 arterial: 50.8 mmHg — ABNORMAL HIGH (ref 35.0–45.0)
pCO2 arterial: 54.4 mmHg — ABNORMAL HIGH (ref 35.0–45.0)
pH, Arterial: 7.327 — ABNORMAL LOW (ref 7.350–7.450)
pH, Arterial: 7.344 — ABNORMAL LOW (ref 7.350–7.450)
pH, Arterial: 7.349 — ABNORMAL LOW (ref 7.350–7.450)
pO2, Arterial: 74 mmHg — ABNORMAL LOW (ref 80.0–100.0)
pO2, Arterial: 171 mmHg — ABNORMAL HIGH (ref 80.0–100.0)
pO2, Arterial: 116 mmHg — ABNORMAL HIGH (ref 80.0–100.0)

## 2012-09-09 LAB — PREPARE FRESH FROZEN PLASMA
Unit division: 0
Unit division: 0
Unit division: 0
Unit division: 0
Unit division: 0
Unit division: 0

## 2012-09-09 LAB — CBC
HCT: 27.4 % — ABNORMAL LOW (ref 39.0–52.0)
HCT: 28.4 % — ABNORMAL LOW (ref 39.0–52.0)
HCT: 34.3 % — ABNORMAL LOW (ref 39.0–52.0)
Hemoglobin: 10.1 g/dL — ABNORMAL LOW (ref 13.0–17.0)
Hemoglobin: 12.2 g/dL — ABNORMAL LOW (ref 13.0–17.0)
Hemoglobin: 9.5 g/dL — ABNORMAL LOW (ref 13.0–17.0)
MCH: 30.6 pg (ref 26.0–34.0)
MCH: 31.6 pg (ref 26.0–34.0)
MCH: 31.9 pg (ref 26.0–34.0)
MCHC: 34.7 g/dL (ref 30.0–36.0)
MCHC: 35.6 g/dL (ref 30.0–36.0)
MCHC: 35.6 g/dL (ref 30.0–36.0)
MCV: 88.4 fL (ref 78.0–100.0)
MCV: 88.8 fL (ref 78.0–100.0)
MCV: 89.6 fL (ref 78.0–100.0)
Platelets: 61 10*3/uL — ABNORMAL LOW (ref 150–400)
Platelets: 67 10*3/uL — ABNORMAL LOW (ref 150–400)
Platelets: 72 10*3/uL — ABNORMAL LOW (ref 150–400)
RBC: 3.1 MIL/uL — ABNORMAL LOW (ref 4.22–5.81)
RBC: 3.2 MIL/uL — ABNORMAL LOW (ref 4.22–5.81)
RBC: 3.83 MIL/uL — ABNORMAL LOW (ref 4.22–5.81)
RDW: 14.8 % (ref 11.5–15.5)
RDW: 15.2 % (ref 11.5–15.5)
RDW: 15.3 % (ref 11.5–15.5)
WBC: 10.6 10*3/uL — ABNORMAL HIGH (ref 4.0–10.5)
WBC: 10.8 10*3/uL — ABNORMAL HIGH (ref 4.0–10.5)
WBC: 13.8 10*3/uL — ABNORMAL HIGH (ref 4.0–10.5)

## 2012-09-09 LAB — BASIC METABOLIC PANEL
BUN: 12 mg/dL (ref 6–23)
BUN: 22 mg/dL (ref 6–23)
CO2: 27 mEq/L (ref 19–32)
CO2: 27 mEq/L (ref 19–32)
Calcium: 9.6 mg/dL (ref 8.4–10.5)
Calcium: 8.1 mg/dL — ABNORMAL LOW (ref 8.4–10.5)
Chloride: 105 mEq/L (ref 96–112)
Chloride: 108 mEq/L (ref 96–112)
Creatinine, Ser: 0.71 mg/dL (ref 0.50–1.35)
Creatinine, Ser: 1.3 mg/dL (ref 0.50–1.35)
GFR calc Af Amer: >90 mL/min (ref >90)
GFR calc Af Amer: 63 mL/min — ABNORMAL LOW (ref >90)
GFR calc non Af Amer: >90 mL/min (ref >90)
GFR calc non Af Amer: 54 mL/min — ABNORMAL LOW (ref >90)
Glucose, Bld: 132 mg/dL — ABNORMAL HIGH (ref 70–99)
Glucose, Bld: 120 mg/dL — ABNORMAL HIGH (ref 70–99)
Potassium: 4.7 mEq/L (ref 3.5–5.1)
Potassium: 4.4 mEq/L (ref 3.5–5.1)
Sodium: 140 mEq/L (ref 135–145)
Sodium: 143 mEq/L (ref 135–145)

## 2012-09-09 LAB — MRSA PCR SCREENING: MRSA by PCR: NEGATIVE

## 2012-09-09 LAB — TROPONIN I
Troponin I: <0.3 ng/mL (ref <0.30)
Troponin I: <0.3 ng/mL (ref <0.30)
Troponin I: <0.3 ng/mL (ref <0.30)

## 2012-09-09 LAB — COMPREHENSIVE METABOLIC PANEL
ALT: 30 U/L (ref 0–53)
AST: 35 U/L (ref 0–37)
Albumin: 2.6 g/dL — ABNORMAL LOW (ref 3.5–5.2)
Alkaline Phosphatase: 49 U/L (ref 39–117)
BUN: 16 mg/dL (ref 6–23)
CO2: 29 mEq/L (ref 19–32)
Calcium: 8.8 mg/dL (ref 8.4–10.5)
Chloride: 107 mEq/L (ref 96–112)
Creatinine, Ser: 0.91 mg/dL (ref 0.50–1.35)
GFR calc Af Amer: >90 mL/min (ref >90)
GFR calc non Af Amer: 84 mL/min — ABNORMAL LOW (ref >90)
Glucose, Bld: 142 mg/dL — ABNORMAL HIGH (ref 70–99)
Potassium: 4.5 mEq/L (ref 3.5–5.1)
Sodium: 142 mEq/L (ref 135–145)
Total Bilirubin: 0.8 mg/dL (ref 0.3–1.2)
Total Protein: 4.6 g/dL — ABNORMAL LOW (ref 6.0–8.3)

## 2012-09-09 LAB — DIC (DISSEMINATED INTRAVASCULAR COAGULATION)PANEL
D-Dimer, Quant: 10.9 ug/mL-FEU — ABNORMAL HIGH (ref 0.00–0.48)
Fibrinogen: 165 mg/dL — ABNORMAL LOW (ref 204–475)
INR: 1.48 (ref 0.00–1.49)
Platelets: 76 10*3/uL — ABNORMAL LOW (ref 150–400)
Prothrombin Time: 17.5 seconds — ABNORMAL HIGH (ref 11.6–15.2)
Smear Review: NONE SEEN
aPTT: 40 seconds — ABNORMAL HIGH (ref 24–37)

## 2012-09-09 LAB — AMYLASE
Amylase: 34 U/L (ref 0–105)
Amylase: 46 U/L (ref 0–105)

## 2012-09-09 LAB — CK TOTAL AND CKMB (NOT AT ARMC)
CK, MB: 2.9 ng/mL (ref 0.3–4.0)
CK, MB: 3.3 ng/mL (ref 0.3–4.0)
CK, MB: 3 ng/mL (ref 0.3–4.0)
Relative Index: INVALID (ref 0.0–2.5)
Relative Index: 3.2 — ABNORMAL HIGH (ref 0.0–2.5)
Relative Index: 1.5 (ref 0.0–2.5)
Total CK: 85 U/L (ref 7–232)
Total CK: 102 U/L (ref 7–232)
Total CK: 200 U/L (ref 7–232)

## 2012-09-09 LAB — GLUCOSE, CAPILLARY
Glucose-Capillary: 138 mg/dL — ABNORMAL HIGH (ref 70–99)
Glucose-Capillary: 115 mg/dL — ABNORMAL HIGH (ref 70–99)
Glucose-Capillary: 116 mg/dL — ABNORMAL HIGH (ref 70–99)
Glucose-Capillary: 112 mg/dL — ABNORMAL HIGH (ref 70–99)

## 2012-09-09 LAB — PROTIME-INR
INR: 1.51 — ABNORMAL HIGH (ref 0.00–1.49)
INR: 1.35 (ref 0.00–1.49)
Prothrombin Time: 17.8 seconds — ABNORMAL HIGH (ref 11.6–15.2)
Prothrombin Time: 16.4 seconds — ABNORMAL HIGH (ref 11.6–15.2)

## 2012-09-09 LAB — APTT: aPTT: 42 seconds — ABNORMAL HIGH (ref 24–37)

## 2012-09-09 LAB — MAGNESIUM
Magnesium: 1.5 mg/dL (ref 1.5–2.5)
Magnesium: 1.5 mg/dL (ref 1.5–2.5)

## 2012-09-09 MED ORDER — LABETALOL HCL 5 MG/ML IV SOLN
INTRAVENOUS | Status: AC
  Administered 2012-09-09: 10 mg via INTRAVENOUS
  Filled 2012-09-09: qty 4

## 2012-09-09 MED ORDER — MIDAZOLAM HCL 2 MG/2ML IJ SOLN: 1.0000 mg | INTRAMUSCULAR | Status: DC | PRN

## 2012-09-09 MED ORDER — POTASSIUM CHLORIDE CRYS ER 20 MEQ PO TBCR
20.0000 meq | EXTENDED_RELEASE_TABLET | Freq: Every day | ORAL | Status: DC | PRN
  Filled 2012-09-09: qty 1

## 2012-09-09 MED ORDER — HETASTARCH-ELECTROLYTES 6 % IV SOLN
500.0000 mL | Freq: Once | INTRAVENOUS | Status: AC
  Administered 2012-09-09: 500 mL via INTRAVENOUS

## 2012-09-09 MED ORDER — MORPHINE SULFATE 2 MG/ML IJ SOLN: 2.0000 mg | INTRAMUSCULAR | Status: DC | PRN

## 2012-09-09 MED ORDER — ACETAMINOPHEN 160 MG/5ML PO SOLN
650.0000 mg | ORAL | Status: DC | PRN
  Administered 2012-09-09: 650 mg
  Filled 2012-09-09 (×2): qty 20.3

## 2012-09-09 MED ORDER — SODIUM CHLORIDE 0.9 % IV SOLN: 500.0000 mL | Freq: Once | INTRAVENOUS | Status: DC | PRN

## 2012-09-09 MED ORDER — PROPOFOL 10 MG/ML IV EMUL
5.0000 ug/kg/min | INTRAVENOUS | Status: DC
  Administered 2012-09-09: 20 ug/kg/min via INTRAVENOUS
  Administered 2012-09-09: 25 ug/kg/min via INTRAVENOUS
  Administered 2012-09-09: 20 ug/kg/min via INTRAVENOUS
  Administered 2012-09-10: 15 ug/kg/min via INTRAVENOUS
  Administered 2012-09-10 – 2012-09-11 (×3): 25 ug/kg/min via INTRAVENOUS
  Filled 2012-09-09 (×7): qty 100

## 2012-09-09 MED ORDER — ACETAMINOPHEN 325 MG PO TABS
325.0000 mg | ORAL_TABLET | ORAL | Status: DC | PRN
  Filled 2012-09-09: qty 2

## 2012-09-09 MED ORDER — HETASTARCH-ELECTROLYTES 6 % IV SOLN
INTRAVENOUS | Status: AC
  Administered 2012-09-09: 250 mL
  Filled 2012-09-09: qty 500

## 2012-09-09 MED ORDER — HYDRALAZINE HCL 20 MG/ML IJ SOLN
10.0000 mg | INTRAMUSCULAR | Status: DC | PRN
  Filled 2012-09-09: qty 0.5

## 2012-09-09 MED ORDER — ACETAMINOPHEN 650 MG RE SUPP
325.0000 mg | RECTAL | Status: DC | PRN
  Filled 2012-09-09: qty 1

## 2012-09-09 MED ORDER — PHENOL 1.4 % MT LIQD
1.0000 | OROMUCOSAL | Status: DC | PRN
  Administered 2012-09-12: 1 via OROMUCOSAL
  Filled 2012-09-09: qty 177

## 2012-09-09 MED ORDER — FUROSEMIDE 10 MG/ML IJ SOLN
10.0000 mg | Freq: Once | INTRAMUSCULAR | Status: AC
  Administered 2012-09-09: 10 mg via INTRAVENOUS

## 2012-09-09 MED ORDER — BIOTENE DRY MOUTH MT LIQD
15.0000 mL | Freq: Four times a day (QID) | OROMUCOSAL | Status: DC
  Administered 2012-09-09 – 2012-09-15 (×21): 15 mL via OROMUCOSAL

## 2012-09-09 MED ORDER — FENTANYL BOLUS VIA INFUSION
25.0000 ug | Freq: Four times a day (QID) | INTRAVENOUS | Status: DC | PRN
  Filled 2012-09-09: qty 100

## 2012-09-09 MED ORDER — DOPAMINE-DEXTROSE 3.2-5 MG/ML-% IV SOLN: 3.0000 ug/kg/min | INTRAVENOUS | Status: DC | PRN

## 2012-09-09 MED ORDER — ALBUTEROL SULFATE HFA 108 (90 BASE) MCG/ACT IN AERS
4.0000 | INHALATION_SPRAY | RESPIRATORY_TRACT | Status: DC | PRN
  Filled 2012-09-09: qty 6.7

## 2012-09-09 MED ORDER — CHLORHEXIDINE GLUCONATE 0.12 % MT SOLN
15.0000 mL | Freq: Two times a day (BID) | OROMUCOSAL | Status: DC
Start: 2012-09-09 — End: 2012-09-15
  Administered 2012-09-09 – 2012-09-15 (×13): 15 mL via OROMUCOSAL
  Filled 2012-09-09 (×16): qty 15

## 2012-09-09 MED ORDER — MIDAZOLAM HCL 2 MG/2ML IJ SOLN: 2.0000 mg | Freq: Once | INTRAMUSCULAR | Status: DC

## 2012-09-09 MED ORDER — DEXTROSE 5 % IV SOLN
INTRAVENOUS | Status: DC | PRN
  Administered 2012-09-08: 21:00:00 via INTRAVENOUS

## 2012-09-09 MED ORDER — DEXTROSE 5 % IV SOLN
1.5000 g | Freq: Two times a day (BID) | INTRAVENOUS | Status: AC
  Administered 2012-09-09 (×2): 1.5 g via INTRAVENOUS
  Filled 2012-09-09 (×2): qty 1.5

## 2012-09-09 MED ORDER — LABETALOL HCL 5 MG/ML IV SOLN
10.0000 mg | INTRAVENOUS | Status: DC | PRN
  Administered 2012-09-09 (×2): 10 mg via INTRAVENOUS
  Filled 2012-09-09: qty 4

## 2012-09-09 MED ORDER — PANTOPRAZOLE SODIUM 40 MG IV SOLR
40.0000 mg | Freq: Every day | INTRAVENOUS | Status: DC
  Administered 2012-09-09 – 2012-09-13 (×6): 40 mg via INTRAVENOUS
  Filled 2012-09-09 (×8): qty 40

## 2012-09-09 MED ORDER — BISACODYL 10 MG RE SUPP: 10.0000 mg | Freq: Every day | RECTAL | Status: DC | PRN

## 2012-09-09 MED ORDER — ONDANSETRON HCL 4 MG/2ML IJ SOLN: 4.0000 mg | Freq: Four times a day (QID) | INTRAMUSCULAR | Status: DC | PRN

## 2012-09-09 MED ORDER — KCL IN DEXTROSE-NACL 20-5-0.9 MEQ/L-%-% IV SOLN
INTRAVENOUS | Status: DC
  Administered 2012-09-09: 125 mL/h via INTRAVENOUS
  Administered 2012-09-10: 100 mL via INTRAVENOUS
  Administered 2012-09-11: 08:00:00 via INTRAVENOUS
  Filled 2012-09-09 (×9): qty 1000

## 2012-09-09 MED ORDER — CEFAZOLIN SODIUM-DEXTROSE 2-3 GM-% IV SOLR
INTRAVENOUS | Status: DC | PRN
  Administered 2012-09-08: 2 g via INTRAVENOUS

## 2012-09-09 MED ORDER — PROPOFOL 10 MG/ML IV EMUL
INTRAVENOUS | Status: AC
  Administered 2012-09-09: 20 ug/kg/min via INTRAVENOUS
  Filled 2012-09-09: qty 100

## 2012-09-09 MED ORDER — MAGNESIUM SULFATE 40 MG/ML IJ SOLN
2.0000 g | Freq: Every day | INTRAMUSCULAR | Status: DC | PRN
  Administered 2012-09-10: 2 g via INTRAVENOUS
  Filled 2012-09-09: qty 50
  Filled 2012-09-09 (×2): qty 100

## 2012-09-09 MED ORDER — SODIUM CHLORIDE 0.9 % IV SOLN
25.0000 ug/h | INTRAVENOUS | Status: DC
  Administered 2012-09-09 – 2012-09-10 (×2): 100 ug/h via INTRAVENOUS
  Administered 2012-09-10: 125 ug/h via INTRAVENOUS
  Filled 2012-09-09 (×3): qty 50

## 2012-09-09 MED ORDER — METOPROLOL TARTRATE 1 MG/ML IV SOLN
5.0000 mg | Freq: Four times a day (QID) | INTRAVENOUS | Status: DC
  Administered 2012-09-09 – 2012-09-13 (×17): 5 mg via INTRAVENOUS
  Filled 2012-09-09 (×30): qty 5

## 2012-09-09 MED ORDER — METOPROLOL TARTRATE 1 MG/ML IV SOLN
2.0000 mg | INTRAVENOUS | Status: DC | PRN
  Filled 2012-09-09 (×2): qty 5

## 2012-09-09 NOTE — Op Note (Signed)
Vascular Surgery H&P  Chief Complaint: Ruptured abdominal aortic aneurysm  HPI: Brandon [name] is a 71 y.o. male who presents for evaluation of ruptured abdominal aortic aneurysm. Patient developed severe abdominal and back pain about 3 hours prior to arrival to the emergency department. He was found by ultrasound have a large infrarenal abdominal aortic aneurysm. CT scan revealed rupture of the abdominal aortic aneurysm with no satisfactory infrarenal neck for stent grafting he was taken immediately to the operating room for open repair. Family denied any previous knowledge of aneurysm.   History reviewed. No pertinent past medical history. History reviewed. No pertinent past surgical history. History   Social History  . Marital Status: Married    Spouse Name: N/A    Number of Children: N/A  . Years of Education: N/A   Social History Main Topics  . Smoking status: Current Every Day Smoker -- 1.0 packs/day  . Smokeless tobacco: None  . Alcohol Use: No  . Drug Use: No  . Sexually Active:    Other Topics Concern  . None   Social History Narrative  . None   No family history on file. No Known Allergies Prior to Admission medications   Medication Sig Start Date End Date Taking? Authorizing Provider  ibuprofen (ADVIL,MOTRIN) 200 MG tablet Take 400 mg by mouth every morning.   Yes Historical Provider, MD     Positive ROS: Denies any history of coronary artery disease, stroke, diabetes mellitus, chest pain, dyspnea on exertion.  All other systems have been reviewed and were otherwise negative with the exception of those mentioned in the HPI and as above.  Physical Exam: Filed Vitals:   09/08/12 2035  BP: 96/76  Pulse: 76  Temp:   Resp: 21    General: Alert, no acute distress--- complaining of abdominal pain HEENT: Normal for age Cardiovascular: Regular rate and rhythm. Carotid pulses 2+, no bruits audible Respiratory: Clear to auscultation. No cyanosis, no use of  accessory musculature GI: No organomegaly, abdomen is distended with large breast mass. Skin: No lesions in the area of chief complaint Neurologic: Sensation intact distally Psychiatric: Patient is competent for consent with normal mood and affect Musculoskeletal: No obvious deformities Extremities: Both feet cool and pale with palpable 2+ posterior tibial pulses   Imaging reviewed: CT scan reviewed and reveals 6 cm infrarenal abdominal aortic aneurysm suspicion of rupture proximallywith no infrarenal neck satisfactory for stent grafting    Assessment/Plan: immediate exploration for possible repair of ruptured abdominal aortic aneurysm Critical nature of this problem was thoroughly discussed with the patient's family including his wife and the high mortality rate associated with this condition. They understand this      Josephina Gip, MD 09/09/2012 12:06 AM

## 2012-09-09 NOTE — Transfer of Care (Signed)
Immediate Anesthesia Transfer of Care Note  Patient: Brandon [name]  Procedure(s) Performed: Procedure(s) (LRB) with comments: ANEURYSM ABDOMINAL AORTIC REPAIR (N/A) - open repair ruptured abdomenal aortic aneurysm  Patient Location: SICU  Anesthesia Type: General  Level of Consciousness: sedated and Patient remains intubated per anesthesia plan  Airway & Oxygen Therapy: Patient remains intubated per anesthesia plan  Post-op Assessment: Report given to SICU RN, Post -op Vital signs reviewed and stable,Pt critical, Dr. Hart Rochester at the bedside.  Post vital signs: Reviewed and stable  Complications: No apparent anesthesia complications

## 2012-09-09 NOTE — Consult Note (Signed)
Name: Brandon [name] MRN: [id number] DOB: 1941-05-22    LOS: 1  PULMONARY / CRITICAL CARE MEDICINE   Consult: Called by Dr. Kandis Mannan for mechanical ventilation management.  HPI:   71 years old male with no significant PMH except for heavy smoking. Presented to urgent care and then sent here to [facility] with severe abdominal pain and syncope. A CT scan of the abdomen confirmed the presence of a ruptured AAA. He was taken emergently to the OR for resection and grafting of ruptured abdominal aortic aneurysm with insertion of an aorto by common iliac graft. He came out of the OR intubated and sedated. Critical care consult called for mechanical ventilation management. Estimated blood loss during the surgery was about 5 L. In the OR got 10 units of PRBC's, 6 units of FFP and 2 of platelets.   History reviewed. No pertinent past medical history. History reviewed. No pertinent past surgical history. Prior to Admission medications   Medication Sig Start Date End Date Taking? Authorizing Provider  ibuprofen (ADVIL,MOTRIN) 200 MG tablet Take 400 mg by mouth every morning.   Yes Historical Provider, MD   Allergies No Known Allergies  Family History No family history on file. Social History  reports that he has been smoking.  He does not have any smokeless tobacco history on file. He reports that he does not drink alcohol or use illicit drugs.  Review Of Systems:  Unable to provide.   Vital Signs: Temp:  [96.4 F (35.8 C)] 96.4 F (35.8 C) (10/25 1801) Pulse Rate:  [63-85] 78  (10/26 0025) Resp:  [15-24] 16  (10/26 0025) BP: (73-172)/(47-98) 172/98 mmHg (10/26 0025) SpO2:  [86 %-100 %] 100 % (10/26 0025) FiO2 (%):  [100 %] 100 % (10/26 0025) Weight:  [222 lb 10.6 oz (101 kg)] 222 lb 10.6 oz (101 kg) (10/26 0025)  Physical Examination: General:  Intubated, mechanically ventilated, no acute distress Neuro:  Sedated, synchronous. Unable to assess neurologically. HEENT:  PERRL, pink conjunctivae,  moist membranes Neck:  Supple, no JVD   Cardiovascular:  RRR, no M/R/G Lungs:  Few scattered crackles bilaterally, no W/R/R Abdomen:  Midline surgical incision dressed , abdomen distended, bowel sounds present Musculoskeletal:  No pedal edema Skin:  No rash    ASSESSMENT AND PLAN  PULMONARY  Lab 09/08/12 2208 09/08/12 2118  PHART 7.282* 7.254*  PCO2ART 41.1 36.7  PO2ART 362.0* 336.0*  HCO3 20.3 17.1*  O2SAT 100.0 100.0   Ventilator Settings: Vent Mode:  [-] PRVC FiO2 (%):  [100 %] 100 % Set Rate:  [16 bmp] 16 bmp Vt Set:  [540 mL] 540 mL PEEP:  [5 cmH20] 5 cmH20 Plateau Pressure:  [20 cmH20] 20 cmH20 CXR:  No acute infiltrates. Right IJ central line in adequate position ETT:  Adequate position  A:   1) Post op of resection and grafting of ruptured abdominal aortic aneurysm with insertion of an aorto by common iliac graft. 2) Intubated for the procedure.  P:   - Mechanical ventilation   - PRVC, Vt: 7cckg, PEEP: 5, RR: 16, FiO2 100% and adjust to keep O2 sat >94% - VAP order set  CARDIOVASCULAR  Lab 09/08/12 1853  TROPONINI --  LATICACIDVEN 3.5*  PROBNP --   ECG:  NSR Lines: Right IJ central line  A:  1) Post op of resection and grafting of ruptured abdominal aortic aneurysm with insertion of an aorto by common iliac graft. 2) Hypertensive at the time of my exam P:  - BP  control with hydralazine and labetalol PRN per primary team. - Dopamine drip for hypotension / bradycardia per primary team.   RENAL  Lab 09/08/12 2208 09/08/12 2118 09/08/12 1852  NA 140 137 136  K 4.7 3.9 --  CL -- -- 106  CO2 -- -- 21  BUN -- -- 14  CREATININE -- -- 0.74  CALCIUM -- -- 7.9*  MG -- -- --  PHOS -- -- --   Intake/Output      10/25 0701 - 10/26 0700   I.V. (mL/kg) 10300 (102)   Blood 6122   Other 10   IV Piggyback 500   Total Intake(mL/kg) 16932 (167.6)   Urine (mL/kg/hr) 580 (0.2)   Blood 5000   Total Output 5580   Net +11352        Foley:  Placed  08/2512  A:   1) Normal kidney function P:   - We will follow CMP   GASTROINTESTINAL  Lab 09/08/12 1852  AST 8  ALT 6  ALKPHOS 47  BILITOT 0.4  PROT 4.9*  ALBUMIN 2.8*    A:   1) No issues P:   - GI prophylaxis with protonix IV - NGT in place  HEMATOLOGIC  Lab 09/09/12 0031 09/08/12 2208 09/08/12 2120 09/08/12 2118 09/08/12 1852 09/08/12 1729  HGB 12.2* 8.2* -- 5.1* 11.4* 13.6*  HCT 34.3* 24.0* -- 15.0* 33.3* 43.4*  PLT 61* -- 55* -- 160 --  INR -- -- 3.07* -- 1.43 --  APTT -- -- 83* -- 35 --   A:   1) Acute blood loss anemia 2) Massive transfusion (10 units PRBC, 6 units FFP, 2 units of platelets) P:  - We will follow CBC. - Last Hgb is 12.2, need for PRBC transfusion. - We will get DIC panel  INFECTIOUS  Lab 09/09/12 0031 09/08/12 1852 09/08/12 1729  WBC 13.8* 25.9* 16.5*  PROCALCITON -- -- --   Antibiotics: - Cefuroxime per primary team.  A:   1) No evidence of acute infectious process P:   - Antibiotic as above  ENDOCRINE No results found for this basename: GLUCAP:5 in the last 168 hours A:   1) No history of DM  P:   - POCT glucose q6hrs  NEUROLOGIC  A:   1) Intubated, sedated. P:   - Will start continuous sedation with propofol and fentanyl drips   Critical Care Time devoted to patient care services described in this note is: 1 Hour  Waynetta Pean, M.D. Pulmonary and [place] Pager: [phone]  09/09/2012, 1:06 AM

## 2012-09-09 NOTE — Anesthesia Preprocedure Evaluation (Signed)
Anesthesia Evaluation  Patient identified by MRN, date of birth, ID band Patient awake    Reviewed: Allergy & Precautions, H&P , NPO status , Patient's Chart, lab work & pertinent test results  Airway Mallampati: I TM Distance: >3 FB Neck ROM: full    Dental  (+) Caps   Pulmonary          Cardiovascular hypertension, + Peripheral Vascular Disease Rhythm:regular Rate:Tachycardia     Neuro/Psych    GI/Hepatic   Endo/Other    Renal/GU      Musculoskeletal   Abdominal   Peds  Hematology   Anesthesia Other Findings Pt w/ ruptured AAA. Basic hx obtained.Pt AxOX3.  Reproductive/Obstetrics                           Anesthesia Physical Anesthesia Plan  ASA: IV  Anesthesia Plan: General   Post-op Pain Management:    Induction: Intravenous  Airway Management Planned: Oral ETT  Additional Equipment: Arterial line and CVP  Intra-op Plan:   Post-operative Plan: Possible Post-op intubation/ventilation  Informed Consent: I have reviewed the patients History and Physical, chart, labs and discussed the procedure including the risks, benefits and alternatives for the proposed anesthesia with the patient or authorized representative who has indicated his/her understanding and acceptance.     Plan Discussed with: CRNA, Anesthesiologist and Surgeon  Anesthesia Plan Comments:         Anesthesia Quick Evaluation

## 2012-09-09 NOTE — Progress Notes (Signed)
INITIAL ADULT NUTRITION ASSESSMENT Date: 09/09/2012   Time: 10:16 AM Reason for Assessment: Ventilated   INTERVENTION: Recommend initiate TF of Oxepa at 68m/hr with Prostat 362mTID - this will provide 1020 calories, 75g protein, 37745mree water, plus current Propofol rate would be 1421 calories, meeting 68% estimated calorie needs and 93% estimated protein needs - appropriate for ASPEN guidelines for permissive underfeeding in critically ill obese individuals. If IVF d/c recommend 175m68mter flushes q4hr. RD to monitor for nutrition plan and Propofol rate once TF initiated/advanced.   ASSESSMENT: Male 71 y3.  Dx: AAA (abdominal aortic aneurysm, ruptured)  Food/Nutrition Related Hx: Pt admitted with abdominal pain, found to have abdominal aortic aneurysm rupture. Pt intubated for resection and repair of rupture surgery yesterday and remains ventilated for acute post-op respiratory failure. Sister present at bedside and reports pt's weight has been stable. Sister reports MD told her pt is likely to be ventilated a few more days. Sister worried about how pt will be able to breathe afterwards because pt has always been a heavy smoker, 1 pack/day. Pt currently sedated with Propofol at 15ml63m(401 calories).   Hx:  History reviewed. No pertinent past medical history.  Related Meds:  Scheduled Meds:   . antiseptic oral rinse  15 mL Mouth Rinse QID  . cefUROXime (ZINACEF)  IV  1.5 g Intravenous Q12H  . chlorhexidine  15 mL Mouth Rinse BID  . mannitol  25 g Intravenous To OR  . metoprolol  5 mg Intravenous Q6H  . ondansetron  4 mg Intravenous Once  . pantoprazole (PROTONIX) IV  40 mg Intravenous QHS  . DISCONTD: midazolam  2-4 mg Intravenous Once   Continuous Infusions:   . dextrose 5 % and 0.9 % NaCl with KCl 20 mEq/L 125 mL/hr (09/09/12 0122)  . fentaNYL infusion INTRAVENOUS 100 mcg/hr (09/09/12 0240)  . propofol 30 mcg/kg/min (09/09/12 0700)  . DISCONTD: sodium chloride 1,000 mL  (09/08/12 1902)  . DISCONTD: DOPamine     PRN Meds:.acetaminophen, acetaminophen, albuterol, bisacodyl, fentaNYL, hydrALAZINE, labetalol, magnesium sulfate 1 - 4 g bolus IVPB, metoprolol, ondansetron, phenol, potassium chloride, DISCONTD: sodium chloride, DISCONTD: 0.9 % irrigation (POUR BTL), DISCONTD: DOPamine, DISCONTD: heparin 6000 unit irrigation, DISCONTD: HYDROmorphone, DISCONTD: midazolam, DISCONTD:  morphine injection  Ht: 6' (182.9 cm)  Wt: 222 lb 10.6 oz (101 kg)  Ideal Wt: 178 lb % Ideal Wt: 124  Usual Wt: 222 lb per family report % Usual Wt: 100  Body mass index is 30.20 kg/(m^2). Class I obesity   Labs:  CMP     Component Value Date/Time   NA 142 09/09/2012 0626   K 4.5 09/09/2012 0626   CL 107 09/09/2012 0626   CO2 29 09/09/2012 0626   GLUCOSE 142* 09/09/2012 0626   BUN 16 09/09/2012 0626   CREATININE 0.91 09/09/2012 0626   CALCIUM 8.8 09/09/2012 0626   PROT 4.6* 09/09/2012 0626   ALBUMIN 2.6* 09/09/2012 0626   AST 35 09/09/2012 0626   ALT 30 09/09/2012 0626   ALKPHOS 49 09/09/2012 0626   BILITOT 0.8 09/09/2012 0626   GFRNONAA 84* 09/09/2012 0626   GFRAA >90 09/09/2012 0626    Intake/Output Summary (Last 24 hours) at 09/09/12 1031 Last data filed at 09/09/12 0700  Gross per 24 hour  Intake  19268 ml  Output   6555 ml  Net  12713 ml   Last BM - 10/26, loose   Diet Order: NPO   IVF:    dextrose 5 % and  0.9 % NaCl with KCl 20 mEq/L Last Rate: 125 mL/hr (09/09/12 0122)  fentaNYL infusion INTRAVENOUS Last Rate: 100 mcg/hr (09/09/12 0240)  propofol Last Rate: 30 mcg/kg/min (09/09/12 0700)  DISCONTD: sodium chloride Last Rate: 1,000 mL (09/08/12 1902)  DISCONTD: DOPamine     Estimated Nutritional Needs:   Kcal: 2100 Protein: 80-95g Fluid: >2.1  NUTRITION DIAGNOSIS: -Inadequate oral intake (NI-2.1).  Status: Ongoing  RELATED TO: inability to eat  AS EVIDENCE BY: NPO, ventilated   MONITORING/EVALUATION(Goals): Recommend TF initiation as  pt not expected to be extubated in next 1-2 days. Goal would be for enteral nutrition to provide 60-70% of estimated calorie needs (22-25 kcals/kg ideal body weight) and >/= 90% of estimated protein needs, based on ASPEN guidelines for permissive underfeeding in critically ill obese individuals.  EDUCATION NEEDS: -Education not appropriate at this time   Dietitian #: [place] Per approved criteria  -Obesity Unspecified    Glory Rosebush 09/09/2012, 10:16 AM

## 2012-09-09 NOTE — Progress Notes (Signed)
Patient ID: Brandon [name], male   DOB: 07-May-1941, 71 y.o.   MRN: [id number] Vascular Surgery Progress Note  Subjective: 12 hours post surgery for ruptured abdominal aortic aneurysm. Patient sedated on ventilator. Remains on fentanyl and propofol drip. Beginning to wake up and follow commands according to nurse. Head stable might with blood pressure [id number] systolic or greater and no tachycardia.  Objective:  Filed Vitals:   09/09/12 0740  BP: 142/73  Pulse: 74  Temp:   Resp: 16    Gen. sedated on ventilator-does respond to simple questions Lungs-fully expanded no rhonchi Abdomen tense-distended Extremities 3+ femoral 2+ posterior tibial pulses palpable bilaterally-both feet pink and well-perfused    Labs:  Lab 09/09/12 0626 09/09/12 0031 09/08/12 1852  CREATININE 0.91 0.71 0.74    Lab 09/09/12 0626 09/09/12 0031 09/08/12 2208 09/08/12 1852  NA 142 140 140 --  K 4.5 4.7 4.7 --  CL 107 105 -- 106  CO2 29 27 -- 21  BUN 16 12 -- 14  CREATININE 0.91 0.71 -- 0.74  LABGLOM -- -- -- --  GLUCOSE 142* -- -- --  CALCIUM 8.8 9.6 -- 7.9*    Lab 09/09/12 0626 09/09/12 0320 09/09/12 0031 09/08/12 2208 09/08/12 1852  WBC 10.8* -- 13.8* -- 25.9*  HGB 10.1* -- 12.2* 8.2* --  HCT 28.4* -- 34.3* 24.0* --  PLT 72* 76* 61* -- --    Lab 09/09/12 0626 09/09/12 0320 09/09/12 0031  INR 1.35 1.48 1.51*    I/O last 3 completed shifts: In: [id number] [P.O.:100; I.V.:11371; Blood:7117; Other:10; NG/GT:110; IV Piggyback:560] Out: IV:[id number]; Blood:5000]  Imaging: Ct Abdomen Pelvis Wo Contrast  09/08/2012  *RADIOLOGY REPORT*  Clinical Data: Abdominal pain  CT ABDOMEN AND PELVIS WITHOUT CONTRAST  Technique:  Multidetector CT imaging of the abdomen and pelvis was performed following the standard protocol without intravenous contrast.  Comparison: None.  Findings: There is subsegmental atelectasis noted in both lung bases.  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  Evidence of diffuse retroperitoneal hemorrhage is identified which is uplifting and displacing the pancreas, duodenum and left kidney. Hemorrhage extends into bilateral perinephric space is normal along the ventral surface of the left iliopsoas muscle into the superior portion of the pelvis.  There is a small amount of hemoperitoneum is noted within the dependent portion of the pelvis.  No focal liver abnormality.  Gallbladder appears normal.  No biliary dilatation.  Normal appearance of the pancreas.  The spleen is negative.  The adrenal glands are both normal.   The urinary bladder appears normal.  There is a low density cyst within the upper pole the right kidney measuring 1.2 cm. Incompletely characterized without IV contrast.  No focal left kidney abnormality.  The patient has a hiatal.  There is a small hiatal hernia.  The small bowel loops have a normal caliber.  The appendix is visualized and appears normal.  There is collapse of the proximal colon.  The mid and distal colon appear normal.  Review of the visualized osseous structures is unremarkable.  The patient has a small umbilical hernia which contains fat only. Bilateral fat containing inguinal hernias are also noted.  IMPRESSION:  1.  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  There is acute hemorrhage throughout the retroperitoneum which is displacing the small bowel loops and left kidney. 2.  Small amount of hemoperitoneum is noted within the dependent portion of the pelvis.  Critical Value/emergent results were called by telephone at  the time of interpretation on 09/08/2012 at 7:42 p.m. to Dr. Kellie Simmering, who verbally acknowledged these results.   Original Report Authenticated By: Angelita Ingles, M.D.    Dg Chest Portable 1 View  09/09/2012  *RADIOLOGY REPORT*  Clinical Data: Postop.  PORTABLE CHEST - 1 VIEW  Comparison: 09/09/2012 at 0058 hours.  Findings: Endotracheal tube remains in place with tip about 5.4 cm above the carina.   Enteric tube is in place.  The tip is not visualized below the field of view but is below the left hemidiaphragm consistent with location at least in the stomach. Right central venous catheter tip is in the region of the thoracic inlet, likely in the lower jugular or upper SVC region.  No pneumothorax.  Shallow inspiration with elevation of right hemidiaphragm.  Mild cardiac enlargement with prominence of central pulmonary vascularity.  No significant change since previous study.  IMPRESSION: Stable appearance of the chest and appliances since previous study.   Original Report Authenticated By: Neale Burly, M.D.    Dg Chest Portable 1 View  09/09/2012  *RADIOLOGY REPORT*  Clinical Data: Line placement  PORTABLE CHEST - 1 VIEW  Comparison: 09/08/2012  Findings: Endotracheal tube terminates 5 cm above the carina.  Right IJ venous sheath terminates in the right brachiocephalic vein.  No pneumothorax.  Low lung volumes.  Right basilar opacity, likely atelectasis.  Enteric tube courses below the diaphragm.  IMPRESSION: Endotracheal tube terminates 5 cm above the carina.  Right IJ venous sheath terminates in the right brachiocephalic vein.  No pneumothorax.   Original Report Authenticated By: Julian Hy, M.D.    Dg Chest Portable 1 View  09/08/2012  *RADIOLOGY REPORT*  Clinical Data: Preop AAA  PORTABLE CHEST - 1 VIEW  Comparison: None.  Findings: Lungs are essentially clear. No pleural effusion or pneumothorax.  Cardiomediastinal silhouette is within normal limits.  IMPRESSION: No evidence of acute cardiopulmonary disease.   Original Report Authenticated By: Julian Hy, M.D.    Dg Abd Portable 1v  09/08/2012  *RADIOLOGY REPORT*  Clinical Data: AAA repair, postop count film  PORTABLE ABDOMEN - 1 VIEW  Comparison: CT abdomen pelvis dated 09/08/2012 at 1911 hours.  Findings: No radiopaque foreign body is seen.  Midline skin staples.  IMPRESSION: No radiopaque foreign body is seen.   Original  Report Authenticated By: Julian Hy, M.D.     Assessment/Plan:  POD #1  LOS: 1 day  s/p Procedure(s): ANEURYSM ABDOMINAL AORTIC REPAIR-ruptured  #1 renal function. Creatinine 0.9 and good urinary output overnight-1500 cc since surgery #2 pulmonary function stable on the ventilator with good blood gases-no acidosis #3 hematology-hematocrit stable at 29%-INR equals 1.35 PTT 42 platelet count 72 #4 abdomen-tense as expected with diffuse oozing from ruptured abdominal aortic aneurysm and multiple unit transfusion #5 cardiac status stable with good blood pressure and no tachycardia   Plan  patient should remain on ventilator today with tense abdomen and diffuse edema with scleral edema she is to mobilize fluid Continue to watch clotting studies and hemoglobin May need diuresis to help mobilize fluids  In general patient doing well at this point-hope to extubate him next few days depending on pulmonary status, abdominal girth, in general condition Appreciate CCM's assistance    Tinnie Gens, MD 09/09/2012 9:15 AM

## 2012-09-09 NOTE — Progress Notes (Signed)
  Echocardiogram 2D Echocardiogram has been performed.  [place] 09/09/2012, 3:31 PM

## 2012-09-09 NOTE — Anesthesia Postprocedure Evaluation (Signed)
  Anesthesia Post-op Note  Patient: Brandon [name]  Procedure(s) Performed: Procedure(s) (LRB) with comments: ANEURYSM ABDOMINAL AORTIC REPAIR (N/A) - open repair ruptured abdomenal aortic aneurysm  Patient Location: SICU  Anesthesia Type: General  Level of Consciousness: sedated and Patient remains intubated per anesthesia plan  Airway and Oxygen Therapy: Patient remains intubated per anesthesia plan and Patient placed on Ventilator (see vital sign flow sheet for setting)  Post-op Pain: none  Post-op Assessment: Post-op Vital signs reviewed, Patient's Cardiovascular Status Stable, Respiratory Function Stable, Patent Airway, No signs of Nausea or vomiting and Pain level controlled  Post-op Vital Signs: stable  Complications: No apparent anesthesia complications

## 2012-09-09 NOTE — Progress Notes (Signed)
Referring provider: Tinnie Gens Reason for consultation: Post-op respiratory failure Chief complaint: Abdominal pain  Brandon [name] is a 71 y.o. male smoker admitted on 09/08/2012 with abdominal pain and syncope 2nd to ruptured AAA.  Had emergent repair, and remained on vent post-op.  PCCM consulted for vent management.  No known significant PMHx.  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   SUBJECTIVE: Tolerates pressure support.  Remains on sedation.  OBJECTIVE:  Blood pressure 142/73, pulse 74, temperature 97.8 F (36.6 C), temperature source Axillary, resp. rate 16, height 6' (1.829 m), weight 222 lb 10.6 oz (101 kg), SpO2 100.00%. Wt Readings from Last 3 Encounters:  09/09/12 222 lb 10.6 oz (101 kg)  09/09/12 222 lb 10.6 oz (101 kg)   Body mass index is 30.20 kg/(m^2).  I/O last 3 completed shifts: In: [id number] [P.O.:100; I.V.:11371; Blood:7117; Other:10; NG/GT:110; IV Piggyback:560] Out: [id number] [Urine:1555; Blood:5000]  Vent Mode:  [-] PRVC FiO2 (%):  [40 %-100 %] 40 % Set Rate:  [16 bmp] 16 bmp Vt Set:  [540 mL] 540 mL PEEP:  [5 cmH20] 5 cmH20 Plateau Pressure:  [20 cmH20] 20 cmH20  General - No distress HEENT - ETT in place Cardiac - s1s2 regular, no murmur Chest - no wheeze Abd - soft, mild distention, non tender GU - foley in place Ext - warm, no edema Neuro - sedated Skin - no rashes  Lab Results  Component Value Date   WBC 10.8* 09/09/2012   HGB 10.1* 09/09/2012   HCT 28.4* 09/09/2012   MCV 88.8 09/09/2012   PLT 72* 09/09/2012   Lab Results  Component  Value Date   CREATININE 0.91 09/09/2012   BUN 16 09/09/2012   NA 142 09/09/2012   K 4.5 09/09/2012   CL 107 09/09/2012   CO2 29 09/09/2012   Lab Results  Component Value Date   ALT 30 09/09/2012   AST 35 09/09/2012   ALKPHOS 49 09/09/2012   BILITOT 0.8 09/09/2012   ABG    Component Value Date/Time   PHART 7.349* 09/09/2012 0619   PCO2ART 54.4* 09/09/2012 0619   PO2ART 116.0* 09/09/2012 0619   HCO3 29.9* 09/09/2012 0619   TCO2 32 09/09/2012 0619   ACIDBASEDEF 7.0* 09/08/2012 2208   O2SAT 98.0 09/09/2012 0619    CBG (last 3)   Basename 09/09/12 0312  GLUCAP 138*     Ct Abdomen Pelvis Wo Contrast  09/08/2012  *RADIOLOGY REPORT*  Clinical Data: Abdominal pain  CT ABDOMEN AND PELVIS WITHOUT CONTRAST  Technique:  Multidetector CT imaging of the abdomen and pelvis was performed following the standard protocol without intravenous contrast.  Comparison: None.  Findings: There is subsegmental atelectasis noted in both lung bases.  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 Evidence of diffuse retroperitoneal hemorrhage is identified which is uplifting and displacing the pancreas, duodenum and left kidney. Hemorrhage extends into bilateral perinephric space is normal along the ventral surface of the left iliopsoas muscle into the superior portion of the pelvis.  There is a small amount of hemoperitoneum is noted within the dependent portion of the pelvis.  No focal liver abnormality.  Gallbladder appears normal.  No biliary dilatation.  Normal appearance of the pancreas.  The spleen is negative.  The adrenal glands are both normal.   The urinary bladder appears normal.  There is a low density cyst within the upper pole the right kidney measuring 1.2 cm. Incompletely characterized without IV contrast.  No focal left kidney abnormality.  The patient has a hiatal.  There is a small hiatal hernia.  The small bowel loops have a normal caliber.  The appendix is visualized  and appears normal.  There is collapse of the proximal colon.  The mid and distal colon appear normal.  Review of the visualized osseous structures is unremarkable.  The patient has a small umbilical hernia which contains fat only. Bilateral fat containing inguinal hernias are also noted.  IMPRESSION:  1.  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  There is acute hemorrhage throughout the retroperitoneum which is displacing the small bowel loops and left kidney. 2.  Small amount of hemoperitoneum is noted within the dependent portion of the pelvis.  Critical Value/emergent results were called by telephone at the time of interpretation on 09/08/2012 at 7:42 p.m. to Dr. Kellie Simmering, who verbally acknowledged these results.   Original Report Authenticated By: Angelita Ingles, M.D.    Dg Chest Portable 1 View  09/09/2012  *RADIOLOGY REPORT*  Clinical Data: Postop.  PORTABLE CHEST - 1 VIEW  Comparison: 09/09/2012 at 0058 hours.  Findings: Endotracheal tube remains in place with tip about 5.4 cm above the carina.  Enteric tube is in place.  The tip is not visualized below the field of view but is below the left hemidiaphragm consistent with location at least in the stomach. Right central venous catheter tip is in the region of the thoracic inlet, likely in the lower jugular or upper SVC region.  No pneumothorax.  Shallow inspiration with elevation of right hemidiaphragm.  Mild cardiac enlargement with prominence of central pulmonary vascularity.  No significant change since previous study.  IMPRESSION: Stable appearance of the chest and appliances since previous study.   Original Report Authenticated By: Neale Burly, M.D.    Dg Chest Portable 1 View  09/09/2012  *RADIOLOGY REPORT*  Clinical Data: Line placement  PORTABLE CHEST - 1 VIEW  Comparison: 09/08/2012  Findings: Endotracheal tube terminates 5 cm above the carina.  Right IJ venous sheath terminates in the right brachiocephalic vein.  No  pneumothorax.  Low lung volumes.  Right basilar opacity, likely atelectasis.  Enteric tube courses below the diaphragm.  IMPRESSION: Endotracheal tube terminates 5 cm above the carina.  Right IJ venous sheath terminates in the right brachiocephalic vein.  No pneumothorax.   Original Report Authenticated By: Julian Hy, M.D.    Dg Chest Portable 1 View  09/08/2012  *RADIOLOGY REPORT*  Clinical Data: Preop AAA  PORTABLE CHEST - 1 VIEW  Comparison: None.  Findings: Lungs are essentially clear. No pleural effusion or pneumothorax.  Cardiomediastinal silhouette is within normal limits.  IMPRESSION: No evidence of acute cardiopulmonary disease.   Original Report Authenticated By: Julian Hy, M.D.    Dg Abd Portable 1v  09/08/2012  *RADIOLOGY REPORT*  Clinical Data: AAA repair, postop count film  PORTABLE ABDOMEN - 1 VIEW  Comparison: CT abdomen pelvis dated 09/08/2012 at 1911 hours.  Findings: No radiopaque foreign body is seen.  Midline skin staples.  IMPRESSION: No radiopaque foreign body is seen.   Original Report Authenticated By: Julian Hy, M.D.  ASSESSMENT/PLAN:  Acute post-operative respiratory failure with hx of smoking. No evidence for bronchospasm. P: Full vent support until more stable Respiratory mechanics favorable for vent weaning F/u CXR Adjust RR to allow pt to breath over set rate Adjust Vt to 8 cc/kg (620 cc) Adjust oxygen to keep SpO2 > 92% Needs to stop smoking  Ruptured infrarenal AAA s/p repair P: Post-op care per [facility]VS Cefuroxime for post-op prophylaxis per surgery  Elevated blood pressure. P: F/u Echo, ECG Scheduled lopressor Prn hydralazine, labetalol  Consumption coagulopathy from ruptured AAA. Required multiple blood products during surgery. P: F/u CBC, coags  Nutrition. P: Per VVS  Hyperglycemia. No hx of DM. P: Monitor CBG>>if remains elevated, then start SSI  Sedation. P: Try to limit sedation while on vent  Critical  care time 40 minutes  Chesley Mires, MD [place] 09/09/2012, 7:42 AM Pager:  [phone] After 3pm call: [phone]

## 2012-09-09 NOTE — Preoperative (Signed)
Beta Blockers   Reason not to administer Beta Blockers:Unknown meds

## 2012-09-10 ENCOUNTER — Inpatient Hospital Stay (HOSPITAL_COMMUNITY): Payer: Medicare Other

## 2012-09-10 LAB — PREPARE PLATELET PHERESIS
Unit division: 0
Unit division: 0
Unit division: 0
Unit division: 0
Unit division: 0

## 2012-09-10 LAB — COMPREHENSIVE METABOLIC PANEL
ALT: 25 U/L (ref 0–53)
AST: 29 U/L (ref 0–37)
Albumin: 2.1 g/dL — ABNORMAL LOW (ref 3.5–5.2)
Alkaline Phosphatase: 40 U/L (ref 39–117)
BUN: 27 mg/dL — ABNORMAL HIGH (ref 6–23)
CO2: 27 mEq/L (ref 19–32)
Calcium: 7.7 mg/dL — ABNORMAL LOW (ref 8.4–10.5)
Chloride: 107 mEq/L (ref 96–112)
Creatinine, Ser: 1.47 mg/dL — ABNORMAL HIGH (ref 0.50–1.35)
GFR calc Af Amer: 54 mL/min — ABNORMAL LOW (ref >90)
GFR calc non Af Amer: 47 mL/min — ABNORMAL LOW (ref >90)
Glucose, Bld: 123 mg/dL — ABNORMAL HIGH (ref 70–99)
Potassium: 4.5 mEq/L (ref 3.5–5.1)
Sodium: 141 mEq/L (ref 135–145)
Total Bilirubin: 0.4 mg/dL (ref 0.3–1.2)
Total Protein: 4.2 g/dL — ABNORMAL LOW (ref 6.0–8.3)

## 2012-09-10 LAB — PROTIME-INR
INR: 1.65 — ABNORMAL HIGH (ref 0.00–1.49)
Prothrombin Time: 19 seconds — ABNORMAL HIGH (ref 11.6–15.2)

## 2012-09-10 LAB — CBC
HCT: 24.7 % — ABNORMAL LOW (ref 39.0–52.0)
Hemoglobin: 8.4 g/dL — ABNORMAL LOW (ref 13.0–17.0)
MCH: 30.7 pg (ref 26.0–34.0)
MCHC: 34 g/dL (ref 30.0–36.0)
MCV: 90.1 fL (ref 78.0–100.0)
Platelets: 71 10*3/uL — ABNORMAL LOW (ref 150–400)
RBC: 2.74 MIL/uL — ABNORMAL LOW (ref 4.22–5.81)
RDW: 15.6 % — ABNORMAL HIGH (ref 11.5–15.5)
WBC: 12.6 10*3/uL — ABNORMAL HIGH (ref 4.0–10.5)

## 2012-09-10 LAB — PREPARE FRESH FROZEN PLASMA
Unit division: 0
Unit division: 0

## 2012-09-10 LAB — APTT: aPTT: 41 seconds — ABNORMAL HIGH (ref 24–37)

## 2012-09-10 LAB — GLUCOSE, CAPILLARY
Glucose-Capillary: 105 mg/dL — ABNORMAL HIGH (ref 70–99)
Glucose-Capillary: 115 mg/dL — ABNORMAL HIGH (ref 70–99)
Glucose-Capillary: 122 mg/dL — ABNORMAL HIGH (ref 70–99)
Glucose-Capillary: 126 mg/dL — ABNORMAL HIGH (ref 70–99)

## 2012-09-10 LAB — PHOSPHORUS: Phosphorus: 4.1 mg/dL (ref 2.3–4.6)

## 2012-09-10 LAB — PREPARE RBC (CROSSMATCH)

## 2012-09-10 LAB — AMYLASE: Amylase: 65 U/L (ref 0–105)

## 2012-09-10 LAB — MAGNESIUM: Magnesium: 1.4 mg/dL — ABNORMAL LOW (ref 1.5–2.5)

## 2012-09-10 MED ORDER — FUROSEMIDE 10 MG/ML IJ SOLN
20.0000 mg | Freq: Once | INTRAMUSCULAR | Status: AC
  Administered 2012-09-10: 20 mg via INTRAVENOUS

## 2012-09-10 NOTE — Progress Notes (Signed)
PT Cancellation Note  Patient Details Name: Brandon [name] MRN: [id number] DOB: Nov 26, 1940   Cancelled Treatment:    Reason Eval/Treat Not Completed: Medical issues which prohibited therapy (Patient sedated on vent.)  Will return tomorrow for PT eval if medically ready.   Vena Austria 09/10/2012, 7:33 AM [phone]

## 2012-09-10 NOTE — Progress Notes (Signed)
Referring provider: Josephina Gip Reason for consultation: Post-op respiratory failure Chief complaint: Abdominal pain  Brandon [name] is a 71 y.o. male smoker admitted on 09/08/2012 with abdominal pain and syncope 2nd to ruptured AAA.  Had emergent repair, and remained on vent post-op.  PCCM consulted for vent management.  No known significant PMHx.  Line/tube: ETT 10/26>> Rt radial aline 10/26>> Lt IJ introducer 10/26>>  Cultures: MRSA screen 10/25 >> negative  Antibiotics: Cefuroxime 10/26 >> 10/26  Consultants: None  Best Practice: SUP - protonix DVT - SCD  Tests: 10/25 CT abd/pelvis>>ruptured 8.9 cm infrarenal AAA  Events: 10/26 Resection and grafting of ruptured AAA with insertion of an aorto by common iliac graft, and b/l thrombectomy of the iliac system; EBL 5 liters.   SUBJECTIVE: Marginal oxygenation and urine outpt overnight.  Maintain BP.  Had 1 BM yesterday.  OBJECTIVE:  Blood pressure 119/59, pulse 103, temperature 100.3 F (37.9 C), temperature source Oral, resp. rate 22, height 6' (1.829 m), weight 233 lb 14.5 oz (106.1 kg), SpO2 98.00%. Wt Readings from Last 3 Encounters:  09/10/12 233 lb 14.5 oz (106.1 kg)  09/10/12 233 lb 14.5 oz (106.1 kg)   Body mass index is 31.72 kg/(m^2).  I/O last 3 completed shifts: In: 16109 [P.O.:100; I.V.:13936; Blood:7117; Other:10; NG/GT:200; IV Piggyback:560] Out: 7355 [Urine:2355; Blood:5000]  Vent Mode:  [-] PRVC FiO2 (%):  [40 %-50 %] 40 % Set Rate:  [12 bmp] 12 bmp Vt Set:  [540 mL] 540 mL PEEP:  [5 cmH20] 5 cmH20 Plateau Pressure:  [21 cmH20-22 cmH20] 21 cmH20  General - No distress HEENT - ETT in place Cardiac - s1s2 regular, no murmur Chest - no wheeze Abd - soft, mild distention, non tender GU - foley in place Ext - warm, no edema Neuro - sedated Skin - no rashes  Lab Results  Component Value Date   WBC 12.6* 09/10/2012   HGB 8.4* 09/10/2012   HCT 24.7* 09/10/2012   MCV 90.1 09/10/2012   PLT 71* 09/10/2012   Lab Results  Component Value Date   CREATININE 1.47* 09/10/2012   BUN 27* 09/10/2012   NA 141 09/10/2012   K 4.5 09/10/2012   CL 107 09/10/2012   CO2 27 09/10/2012   Lab Results  Component Value Date   ALT 25 09/10/2012   AST 29 09/10/2012   ALKPHOS 40 09/10/2012   BILITOT 0.4 09/10/2012   ABG    Component Value Date/Time   PHART 7.349* 09/09/2012 0619   PCO2ART 54.4* 09/09/2012 0619   PO2ART 116.0* 09/09/2012 0619   HCO3 29.9* 09/09/2012 0619   TCO2 32 09/09/2012 0619   ACIDBASEDEF 7.0* 09/08/2012 2312   O2SAT 98.0 09/09/2012 0619    CBG (last 3)   Basename 09/10/12 0338 09/10/12 0013 09/09/12 1650  GLUCAP 126* 122* 112*     Ct Abdomen Pelvis Wo Contrast  09/08/2012  *RADIOLOGY REPORT*  Clinical Data: Abdominal pain  CT ABDOMEN AND PELVIS WITHOUT CONTRAST  Technique:  Multidetector CT imaging of the abdomen and pelvis was performed following the standard protocol without intravenous contrast.  Comparison: None.  Findings: There is subsegmental atelectasis noted in both lung bases.  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 Evidence of diffuse retroperitoneal hemorrhage is identified which is uplifting and displacing the pancreas, duodenum and left kidney. Hemorrhage extends into bilateral perinephric space is normal along the ventral surface of the left iliopsoas muscle into the superior portion of the pelvis.  There is a  small amount of hemoperitoneum is noted within the dependent portion of the pelvis.  No focal liver abnormality.  Gallbladder appears normal.  No biliary dilatation.  Normal appearance of the pancreas.  The spleen is negative.  The adrenal glands are both normal.   The urinary bladder appears normal.  There is a low density cyst within the upper pole the right kidney measuring 1.2 cm. Incompletely characterized without IV contrast.  No focal left kidney abnormality.  The patient has a hiatal.  There is a small  hiatal hernia.  The small bowel loops have a normal caliber.  The appendix is visualized and appears normal.  There is collapse of the proximal colon.  The mid and distal colon appear normal.  Review of the visualized osseous structures is unremarkable.  The patient has a small umbilical hernia which contains fat only. Bilateral fat containing inguinal hernias are also noted.  IMPRESSION:  1.  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  There is acute hemorrhage throughout the retroperitoneum which is displacing the small bowel loops and left kidney. 2.  Small amount of hemoperitoneum is noted within the dependent portion of the pelvis.  Critical Value/emergent results were called by telephone at the time of interpretation on 09/08/2012 at 7:42 p.m. to Dr. Hart Rochester, who verbally acknowledged these results.   Original Report Authenticated By: Rosealee Albee, M.D.    Dg Chest Port 1 View  09/10/2012  *RADIOLOGY REPORT*  Clinical Data: Fever.  PORTABLE CHEST - 1 VIEW  Comparison: 09/09/2012.  Findings: Endotracheal tube tip 6.8 cm above the carina.  Right central line tip just proximal to the proximal superior vena cava level.  Right lung apex not entirely excluded on present exam.  No gross pneumothorax.  Nasogastric tube tip gastric antrum level.  Consolidation lung bases may represent combination of atelectasis, infiltrate and / or pleural effusions.  Underlying mass not excluded.  Pulmonary vascular congestion most notable centrally.  Cardiomegaly.  Tortuous aorta.  IMPRESSION: When compared to the prior examination, decreased aeration left lung base raising possibility of interval developing left-sided pleural effusion with left base atelectasis and / or infiltrate.  Persistent consolidation right lung base may be partially explained by elevated right hemidiaphragm although a right-sided pleural effusion, atelectasis or infiltrate is suspected.  Pulmonary vascular prominence most notable centrally.    Original Report Authenticated By: Fuller Canada, M.D.    Dg Chest Portable 1 View  09/09/2012  *RADIOLOGY REPORT*  Clinical Data: Postop.  PORTABLE CHEST - 1 VIEW  Comparison: 09/09/2012 at 0058 hours.  Findings: Endotracheal tube remains in place with tip about 5.4 cm above the carina.  Enteric tube is in place.  The tip is not visualized below the field of view but is below the left hemidiaphragm consistent with location at least in the stomach. Right central venous catheter tip is in the region of the thoracic inlet, likely in the lower jugular or upper SVC region.  No pneumothorax.  Shallow inspiration with elevation of right hemidiaphragm.  Mild cardiac enlargement with prominence of central pulmonary vascularity.  No significant change since previous study.  IMPRESSION: Stable appearance of the chest and appliances since previous study.   Original Report Authenticated By: Marlon Pel, M.D.    Dg Chest Portable 1 View  09/09/2012  *RADIOLOGY REPORT*  Clinical Data: Line placement  PORTABLE CHEST - 1 VIEW  Comparison: 09/08/2012  Findings: Endotracheal tube terminates 5 cm above the carina.  Right IJ venous sheath terminates  in the right brachiocephalic vein.  No pneumothorax.  Low lung volumes.  Right basilar opacity, likely atelectasis.  Enteric tube courses below the diaphragm.  IMPRESSION: Endotracheal tube terminates 5 cm above the carina.  Right IJ venous sheath terminates in the right brachiocephalic vein.  No pneumothorax.   Original Report Authenticated By: Charline Bills, M.D.    Dg Chest Portable 1 View  09/08/2012  *RADIOLOGY REPORT*  Clinical Data: Preop AAA  PORTABLE CHEST - 1 VIEW  Comparison: None.  Findings: Lungs are essentially clear. No pleural effusion or pneumothorax.  Cardiomediastinal silhouette is within normal limits.  IMPRESSION: No evidence of acute cardiopulmonary disease.   Original Report Authenticated By: Charline Bills, M.D.    Dg Abd Portable  1v  09/08/2012  *RADIOLOGY REPORT*  Clinical Data: AAA repair, postop count film  PORTABLE ABDOMEN - 1 VIEW  Comparison: CT abdomen pelvis dated 09/08/2012 at 1911 hours.  Findings: No radiopaque foreign body is seen.  Midline skin staples.  IMPRESSION: No radiopaque foreign body is seen.   Original Report Authenticated By: Charline Bills, M.D.     ASSESSMENT/PLAN:  Acute post-operative respiratory failure with hx of smoking. No evidence for bronchospasm. P: Full vent support until more stable Respiratory mechanics favorable for vent weaning F/u CXR Adjust Vt to 8 cc/kg (620 cc) Adjust oxygen to keep SpO2 > 92% Needs to stop smoking  Ruptured infrarenal AAA s/p repair P: Post-op care per [facility]VS  Elevated blood pressure. Improved 10/27. P: F/u Echo Scheduled lopressor Prn hydralazine, labetalol  Consumption coagulopathy from ruptured AAA. Required multiple blood products during surgery. P: F/u CBC  Nutrition. P: Per VVS  Hyperglycemia. No hx of DM. P: Monitor CBG  Sedation. P: Try to limit sedation while on vent  Critical care time 35 minutes  Coralyn Helling, MD [facility] Pulmonary/Critical Care 09/10/2012, 7:42 AM Pager:  [phone] After 3pm call: [phone]

## 2012-09-10 NOTE — Progress Notes (Signed)
Patient ID: Brandon [name], male   DOB: [birth year], 71 y.o.   MRN: [id number] Vascular Surgery Progress Note  Subjective: 2 days post resection and grafting ruptured abdominal aortic aneurysm. Patient remains sedated on ventilator.  Objective:  Filed Vitals:   09/10/12 0842  BP:   Pulse: 112  Temp:   Resp: 25    Gen.-sedated on ventilator. Patient is arousable. Will follow commands. Pulmonary-lungs fairly clear-no rhonchi Abdomen soft her today-less tense-appropriately tender  Extremities 3+ femoral and posterior tibial pulses palpable bilaterally with pink well-perfused lower extremities   Labs:  Lab 09/10/12 0434 09/09/12 1720 09/09/12 0626  CREATININE 1.47* 1.30 0.91    Lab 09/10/12 0434 09/09/12 1720 09/09/12 0626  NA 141 143 142  K 4.5 4.4 4.5  CL 107 108 107  CO2 27 27 29   BUN 27* 22 16  CREATININE 1.47* 1.30 0.91  LABGLOM -- -- --  GLUCOSE 123* -- --  CALCIUM 7.7* 8.1* 8.8    Lab 09/10/12 0434 09/09/12 1720 09/09/12 0626  WBC 12.6* 10.6* 10.8*  HGB 8.4* 9.5* 10.1*  HCT 24.7* 27.4* 28.4*  PLT 71* 67* 72*    Lab 09/10/12 0434 09/09/12 0626 09/09/12 0320  INR 1.65* 1.35 1.48    I/O last 3 completed shifts: In: 30865 [P.O.:100; I.V.:13936; Blood:7117; Other:10; NG/GT:200; IV Piggyback:560] Out: 7390 [Urine:2390; Blood:5000]  Imaging: Ct Abdomen Pelvis Wo Contrast  09/08/2012  *RADIOLOGY REPORT*  Clinical Data: Abdominal pain  CT ABDOMEN AND PELVIS WITHOUT CONTRAST  Technique:  Multidetector CT imaging of the abdomen and pelvis was performed following the standard protocol without intravenous contrast.  Comparison: None.  Findings: There is subsegmental atelectasis noted in both lung bases.  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 Evidence of diffuse retroperitoneal hemorrhage is identified which is uplifting and displacing the pancreas, duodenum and left kidney. Hemorrhage extends into bilateral perinephric space is normal along the  ventral surface of the left iliopsoas muscle into the superior portion of the pelvis.  There is a small amount of hemoperitoneum is noted within the dependent portion of the pelvis.  No focal liver abnormality.  Gallbladder appears normal.  No biliary dilatation.  Normal appearance of the pancreas.  The spleen is negative.  The adrenal glands are both normal.   The urinary bladder appears normal.  There is a low density cyst within the upper pole the right kidney measuring 1.2 cm. Incompletely characterized without IV contrast.  No focal left kidney abnormality.  The patient has a hiatal.  There is a small hiatal hernia.  The small bowel loops have a normal caliber.  The appendix is visualized and appears normal.  There is collapse of the proximal colon.  The mid and distal colon appear normal.  Review of the visualized osseous structures is unremarkable.  The patient has a small umbilical hernia which contains fat only. Bilateral fat containing inguinal hernias are also noted.  IMPRESSION:  1.  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  There is acute hemorrhage throughout the retroperitoneum which is displacing the small bowel loops and left kidney. 2.  Small amount of hemoperitoneum is noted within the dependent portion of the pelvis.  Critical Value/emergent results were called by telephone at the time of interpretation on 09/08/2012 at 7:42 p.m. to Dr. Hart Rochester, who verbally acknowledged these results.   Original Report Authenticated By: Rosealee Albee, M.D.    Dg Chest Port 1 View  09/10/2012  *RADIOLOGY REPORT*  Clinical Data: Fever.  PORTABLE CHEST - 1 VIEW  Comparison: 09/09/2012.  Findings: Endotracheal tube tip 6.8 cm above the carina.  Right central line tip just proximal to the proximal superior vena cava level.  Right lung apex not entirely excluded on present exam.  No gross pneumothorax.  Nasogastric tube tip gastric antrum level.  Consolidation lung bases may represent combination of  atelectasis, infiltrate and / or pleural effusions.  Underlying mass not excluded.  Pulmonary vascular congestion most notable centrally.  Cardiomegaly.  Tortuous aorta.  IMPRESSION: When compared to the prior examination, decreased aeration left lung base raising possibility of interval developing left-sided pleural effusion with left base atelectasis and / or infiltrate.  Persistent consolidation right lung base may be partially explained by elevated right hemidiaphragm although a right-sided pleural effusion, atelectasis or infiltrate is suspected.  Pulmonary vascular prominence most notable centrally.   Original Report Authenticated By: Fuller Canada, M.D.    Dg Chest Portable 1 View  09/09/2012  *RADIOLOGY REPORT*  Clinical Data: Postop.  PORTABLE CHEST - 1 VIEW  Comparison: 09/09/2012 at 0058 hours.  Findings: Endotracheal tube remains in place with tip about 5.4 cm above the carina.  Enteric tube is in place.  The tip is not visualized below the field of view but is below the left hemidiaphragm consistent with location at least in the stomach. Right central venous catheter tip is in the region of the thoracic inlet, likely in the lower jugular or upper SVC region.  No pneumothorax.  Shallow inspiration with elevation of right hemidiaphragm.  Mild cardiac enlargement with prominence of central pulmonary vascularity.  No significant change since previous study.  IMPRESSION: Stable appearance of the chest and appliances since previous study.   Original Report Authenticated By: Marlon Pel, M.D.    Dg Chest Portable 1 View  09/09/2012  *RADIOLOGY REPORT*  Clinical Data: Line placement  PORTABLE CHEST - 1 VIEW  Comparison: 09/08/2012  Findings: Endotracheal tube terminates 5 cm above the carina.  Right IJ venous sheath terminates in the right brachiocephalic vein.  No pneumothorax.  Low lung volumes.  Right basilar opacity, likely atelectasis.  Enteric tube courses below the diaphragm.   IMPRESSION: Endotracheal tube terminates 5 cm above the carina.  Right IJ venous sheath terminates in the right brachiocephalic vein.  No pneumothorax.   Original Report Authenticated By: Charline Bills, M.D.    Dg Chest Portable 1 View  09/08/2012  *RADIOLOGY REPORT*  Clinical Data: Preop AAA  PORTABLE CHEST - 1 VIEW  Comparison: None.  Findings: Lungs are essentially clear. No pleural effusion or pneumothorax.  Cardiomediastinal silhouette is within normal limits.  IMPRESSION: No evidence of acute cardiopulmonary disease.   Original Report Authenticated By: Charline Bills, M.D.    Dg Abd Portable 1v  09/08/2012  *RADIOLOGY REPORT*  Clinical Data: AAA repair, postop count film  PORTABLE ABDOMEN - 1 VIEW  Comparison: CT abdomen pelvis dated 09/08/2012 at 1911 hours.  Findings: No radiopaque foreign body is seen.  Midline skin staples.  IMPRESSION: No radiopaque foreign body is seen.   Original Report Authenticated By: Charline Bills, M.D.     Assessment/Plan:  POD #2  LOS: 2 days  s/p Procedure(s): ANEURYSM ABDOMINAL AORTIC REPAIR  #1 renal function stable with creatinine 1.47, BUN 27, K4 0.5-excellent urinary output diuresing well #2 hemoglobin 8 with hematocrit 24.7%, platelet count 71,000, INR equals 1.6 #3 vital signs have remained stable with underlying mild tachycardia. Urine output responded to volume replacement  Plan #1 I feel  patient probably needs another 24 hours on ventilator because of abdominal distention and underlying COPD-Will wean today and probably hope to extubate in a.m. if agreeable with CCM #2 transfuse 2 units packed red blood cells today #32 units FFP today #4 continue to watch urinary output-may need diuresis later today  Generally doing well at this 0.2 days post ruptured abdominal aortic aneurysm resection   Josephina Gip, MD 09/10/2012 9:02 AM

## 2012-09-11 ENCOUNTER — Encounter (HOSPITAL_COMMUNITY): Payer: Self-pay | Admitting: Vascular Surgery

## 2012-09-11 ENCOUNTER — Inpatient Hospital Stay (HOSPITAL_COMMUNITY): Payer: Medicare Other

## 2012-09-11 DIAGNOSIS — J449 Chronic obstructive pulmonary disease, unspecified: Secondary | ICD-10-CM | POA: Diagnosis not present

## 2012-09-11 LAB — POCT I-STAT 3, ART BLOOD GAS (G3+)
Acid-Base Excess: 5 mmol/L — ABNORMAL HIGH (ref 0.0–2.0)
Bicarbonate: 30.3 mEq/L — ABNORMAL HIGH (ref 20.0–24.0)
O2 Saturation: 94 %
Patient temperature: 99.2
TCO2: 32 mmol/L (ref 0–100)
pCO2 arterial: 47.5 mmHg — ABNORMAL HIGH (ref 35.0–45.0)
pH, Arterial: 7.413 (ref 7.350–7.450)
pO2, Arterial: 72 mmHg — ABNORMAL LOW (ref 80.0–100.0)

## 2012-09-11 LAB — PREPARE FRESH FROZEN PLASMA
Unit division: 0
Unit division: 0

## 2012-09-11 LAB — CBC
HCT: 27.8 % — ABNORMAL LOW (ref 39.0–52.0)
Hemoglobin: 9.2 g/dL — ABNORMAL LOW (ref 13.0–17.0)
MCH: 30.2 pg (ref 26.0–34.0)
MCHC: 33.1 g/dL (ref 30.0–36.0)
MCV: 91.1 fL (ref 78.0–100.0)
Platelets: 74 10*3/uL — ABNORMAL LOW (ref 150–400)
RBC: 3.05 MIL/uL — ABNORMAL LOW (ref 4.22–5.81)
RDW: 16.5 % — ABNORMAL HIGH (ref 11.5–15.5)
WBC: 14 10*3/uL — ABNORMAL HIGH (ref 4.0–10.5)

## 2012-09-11 LAB — BASIC METABOLIC PANEL
BUN: 29 mg/dL — ABNORMAL HIGH (ref 6–23)
CO2: 30 mEq/L (ref 19–32)
Calcium: 7.8 mg/dL — ABNORMAL LOW (ref 8.4–10.5)
Chloride: 109 mEq/L (ref 96–112)
Creatinine, Ser: 0.94 mg/dL (ref 0.50–1.35)
GFR calc Af Amer: >90 mL/min (ref >90)
GFR calc non Af Amer: 83 mL/min — ABNORMAL LOW (ref >90)
Glucose, Bld: 120 mg/dL — ABNORMAL HIGH (ref 70–99)
Potassium: 3.8 mEq/L (ref 3.5–5.1)
Sodium: 143 mEq/L (ref 135–145)

## 2012-09-11 LAB — GLUCOSE, CAPILLARY
Glucose-Capillary: 108 mg/dL — ABNORMAL HIGH (ref 70–99)
Glucose-Capillary: 109 mg/dL — ABNORMAL HIGH (ref 70–99)
Glucose-Capillary: 119 mg/dL — ABNORMAL HIGH (ref 70–99)
Glucose-Capillary: 119 mg/dL — ABNORMAL HIGH (ref 70–99)
Glucose-Capillary: 125 mg/dL — ABNORMAL HIGH (ref 70–99)

## 2012-09-11 LAB — AMYLASE: Amylase: 41 U/L (ref 0–105)

## 2012-09-11 LAB — PROTIME-INR
INR: 1.47 (ref 0.00–1.49)
Prothrombin Time: 17.4 seconds — ABNORMAL HIGH (ref 11.6–15.2)

## 2012-09-11 LAB — APTT: aPTT: 39 seconds — ABNORMAL HIGH (ref 24–37)

## 2012-09-11 MED ORDER — IPRATROPIUM BROMIDE 0.02 % IN SOLN
0.5000 mg | Freq: Four times a day (QID) | RESPIRATORY_TRACT | Status: DC
  Administered 2012-09-11 – 2012-09-15 (×17): 0.5 mg via RESPIRATORY_TRACT
  Filled 2012-09-11 (×17): qty 2.5

## 2012-09-11 MED ORDER — POTASSIUM CL IN DEXTROSE 5% 20 MEQ/L IV SOLN
20.0000 meq | INTRAVENOUS | Status: DC
  Administered 2012-09-11: 20 meq via INTRAVENOUS
  Filled 2012-09-11 (×3): qty 1000

## 2012-09-11 MED ORDER — ALBUTEROL SULFATE (5 MG/ML) 0.5% IN NEBU
2.5000 mg | INHALATION_SOLUTION | Freq: Four times a day (QID) | RESPIRATORY_TRACT | Status: DC
  Administered 2012-09-11 – 2012-09-15 (×17): 2.5 mg via RESPIRATORY_TRACT
  Filled 2012-09-11 (×17): qty 0.5

## 2012-09-11 MED ORDER — FUROSEMIDE 10 MG/ML IJ SOLN: 20.0000 mg | Freq: Once | INTRAMUSCULAR | Status: DC

## 2012-09-11 MED ORDER — ALBUTEROL SULFATE (5 MG/ML) 0.5% IN NEBU: 2.5000 mg | INHALATION_SOLUTION | RESPIRATORY_TRACT | Status: DC | PRN

## 2012-09-11 MED ORDER — FUROSEMIDE 10 MG/ML IJ SOLN
40.0000 mg | Freq: Once | INTRAMUSCULAR | Status: AC
  Administered 2012-09-11: 40 mg via INTRAVENOUS

## 2012-09-11 MED ORDER — METOPROLOL TARTRATE 1 MG/ML IV SOLN
2.0000 mg | INTRAVENOUS | Status: DC | PRN
  Administered 2012-09-11: 2.5 mg via INTRAVENOUS

## 2012-09-11 MED ORDER — FENTANYL CITRATE 0.05 MG/ML IJ SOLN
50.0000 ug | INTRAMUSCULAR | Status: DC | PRN
  Administered 2012-09-11: 50 ug via INTRAVENOUS
  Filled 2012-09-11: qty 2

## 2012-09-11 NOTE — Progress Notes (Addendum)
[facility]  Post-op  Intra-abdominal Surgery note  Date of Surgery: 09/08/2012 - 09/09/2012 Surgeon: Moishe Spice): Pryor Ochoa, MD POD: 3 Days Post-Op Procedure(s): ANEURYSM ABDOMINAL AORTIC REPAIR  History of Present Illness  Brandon [name] is a 71 y.o. male who is  up s/p Procedure(s): ANEURYSM ABDOMINAL AORTIC REPAIR Pt is doing well. Being weaned from vent this am Labs stabilizing after 2 UPC yesterday  IMAGING: Dg Chest [name] 1 View  09/11/2012  *RADIOLOGY REPORT*  Clinical Data: Follow-up atelectasis.  Ventilator.  PORTABLE CHEST - 1 VIEW  Comparison: 09/10/2012.  Findings: Endotracheal tube is in satisfactory position. Nasogastric tube is followed into the stomach.  Right IJ catheter sheath remains in place.  Heart size stable.  There is moderate diffuse bilateral air space disease, bibasilar dependent.  Streaky densities are seen at the right lung base.  Left lower lobe collapse/consolidation.  Left pleural effusion.  IMPRESSION:  1.  Probable combination of pulmonary edema, left lower lobe collapse/consolidation and right basilar atelectasis. 2.  Small left pleural effusion.   Original Report Authenticated By: Reyes Ivan, M.D.    Dg Chest [name] 1 View  09/10/2012  *RADIOLOGY REPORT*  Clinical Data: Fever.  PORTABLE CHEST - 1 VIEW  Comparison: 09/09/2012.  Findings: Endotracheal tube tip 6.8 cm above the carina.  Right central line tip just proximal to the proximal superior vena cava level.  Right lung apex not entirely excluded on present exam.  No gross pneumothorax.  Nasogastric tube tip gastric antrum level.  Consolidation lung bases may represent combination of atelectasis, infiltrate and / or pleural effusions.  Underlying mass not excluded.  Pulmonary vascular congestion most notable centrally.  Cardiomegaly.  Tortuous aorta.  IMPRESSION: When compared to the prior examination, decreased aeration left lung base raising possibility of  interval developing left-sided pleural effusion with left base atelectasis and / or infiltrate.  Persistent consolidation right lung base may be partially explained by elevated right hemidiaphragm although a right-sided pleural effusion, atelectasis or infiltrate is suspected.  Pulmonary vascular prominence most notable centrally.   Original Report Authenticated By: Fuller Canada, M.D.     Significant Diagnostic Studies: CBC Lab Results  Component Value Date   WBC 14.0* 09/11/2012   HGB 9.2* 09/11/2012   HCT 27.8* 09/11/2012   MCV 91.1 09/11/2012   PLT 74* 09/11/2012    BMET    Component Value Date/Time   NA 143 09/11/2012 0357   K 3.8 09/11/2012 0357   CL 109 09/11/2012 0357   CO2 30 09/11/2012 0357   GLUCOSE 120* 09/11/2012 0357   BUN 29* 09/11/2012 0357   CREATININE 0.94 09/11/2012 0357   CALCIUM 7.8* 09/11/2012 0357   GFRNONAA 83* 09/11/2012 0357   GFRAA >90 09/11/2012 0357    COAG Lab Results  Component Value Date   INR 1.47 09/11/2012   INR 1.65* 09/10/2012   INR 1.35 09/09/2012   No results found for this basename: PTT    I/O last 3 completed shifts: In: 5273.2 [I.V.:3895.2; Blood:1286; NG/GT:30; IV Piggyback:62] Out: 3105 [Urine:3105]    Physical Examination BP Readings from Last 3 Encounters:  09/11/12 123/66  09/08/12 115/85  09/11/12 123/66   Temp Readings from Last 3 Encounters:  09/11/12 99.7 F (37.6 C) Oral  09/11/12 99.7 F (37.6 C) Oral   SpO2 Readings from Last 3 Encounters:  09/11/12 100%  09/08/12 86%  09/11/12 100%   Pulse Readings from Last 3 Encounters:  09/11/12 104  09/08/12  80  09/11/12 104    General: Awake on vent, WDWN male in NAD Pulmonary: normal non-labored breathing , without Rales, rhonchi,  wheezing Cardiac: Heart rate : regular ,  Abdomen:abdomen softer with no BS yet Abdominal wound:clean, dry, intact  Neurologic: A&O X 3; Appropriate Affect ; SENSATION: normal; MOTOR FUNCTION:  moving all extremities  equally. Speech is fluent/normal  Vascular Exam:BLE warm and well perfused Extremities without ischemic changes, no Gangrene, no cellulitis; no open wounds;   LOWER EXTREMITY PULSES           RIGHT                                      LEFT      POSTERIOR TIBIAL  palpable  palpable   Assessment/Plan: Brandon [name] is a 71 y.o. male who is 3 Days Post-Op Procedure(s): ANEURYSM ABDOMINAL AORTIC REPAIR Labs stable after 2UPC Weaning today per CCM Cont meds and will give lasix today U/O adequate at 50-100cc/hr      Marlowe Shores [phone] 09/11/2012 8:04 AM      Had stable night Currently being weaned from ventilator Alert and following commands Fairly good respiratory effort Abdomen slightly less distended 3+ femoral and 2+ posterior tibial pulses palpable  To extubate today if successful with weaning Possible obesity nasogastric tube later today or in a.m. Clotting studies and hemoglobin stable  Generally doing very well post ruptured abdominal aortic aneurysm surgery 3 days ago

## 2012-09-11 NOTE — Procedures (Signed)
Extubation Procedure Note  Patient Details:   Name: Brandon [name] DOB: [birth year] MRN: [id number]   Airway Documentation:     Evaluation  O2 sats: stable throughout and currently acceptable Complications: No apparent complications Patient did tolerate procedure well. Bilateral Breath Sounds: Rhonchi Suctioning: Airway Yes Pt awake and alert. Extubated per MD order. I informed MD pt has a very weal cough. Md still wanted to procede with extubation. Positive cuff leak, BBS rh. Placed on 6L , sats 91. Pt able to vocalize  Arloa Koh 09/11/2012, 9:35 AM

## 2012-09-11 NOTE — Progress Notes (Signed)
Rn placed pt on 50% VM due to desat. Rt entered room and pt sat stin the 80's, placed on NRB. Sat 95%

## 2012-09-11 NOTE — Progress Notes (Signed)
Rehab Admissions Coordinator Note:  Patient was screened by Clois Dupes for appropriateness for an Inpatient Acute Rehab Consult.  At this time, we are recommending Inpatient Rehab consult.  Clois Dupes, RN 09/11/2012, 5:38 PM  I can be reached at [phone].

## 2012-09-11 NOTE — Progress Notes (Signed)
reduced FIO2 to 40%

## 2012-09-11 NOTE — Progress Notes (Signed)
Increased ps to 10, pt weaned 5/5 over 30 mins.

## 2012-09-11 NOTE — Evaluation (Signed)
Occupational Therapy Evaluation Patient Details Name: Brandon [name] MRN: [id number] DOB: 09/10/1941 Today's Date: 09/11/2012 Time: 8657-8469 OT Time Calculation (min): 28 min  OT Assessment / Plan / Recommendation Clinical Impression  This 71 y.o. male admitted for AAA repair.  Extubated this morning.  Anticipate good progress if respiratory status continues to improve.  Pt. will benefit from OT to maximize safety and independence with BADLs to allow pt to return home with min A to supervision level    OT Assessment  Patient needs continued OT Services    Follow Up Recommendations  Inpatient Rehab    Barriers to Discharge Decreased caregiver support wife works full time  Equipment Recommendations  3 in 1 bedside comode;Rolling walker with 5" wheels    Recommendations for Other Services Rehab consult  Frequency  Min 2X/week    Precautions / Restrictions Precautions Precautions: Fall Restrictions Weight Bearing Restrictions: No   Pertinent Vitals/Pain    ADL  Eating/Feeding: Set up Where Assessed - Eating/Feeding: Chair Grooming: Wash/dry hands;Wash/dry face;Set up Where Assessed - Grooming: Supported sitting Upper Body Bathing: Moderate assistance Where Assessed - Upper Body Bathing: Supported sitting Lower Body Bathing: Maximal assistance Where Assessed - Lower Body Bathing: Supported sit to stand Upper Body Dressing: Maximal assistance Where Assessed - Upper Body Dressing: Unsupported sitting Lower Body Dressing: +1 Total assistance Where Assessed - Lower Body Dressing: Unsupported sit to stand Toilet Transfer: +2 Total assistance Toilet Transfer: Patient Percentage: 60% Toilet Transfer Method: Sit to stand;Stand pivot Acupuncturist: Other (comment) (recliner) Toileting - Clothing Manipulation and Hygiene: +1 Total assistance Where Assessed - Toileting Clothing Manipulation and Hygiene: Standing Transfers/Ambulation Related to ADLs: +2 total A (pt 60%)     OT Diagnosis: Generalized weakness  OT Problem List: Decreased strength;Decreased activity tolerance;Impaired balance (sitting and/or standing);Decreased knowledge of use of DME or AE;Cardiopulmonary status limiting activity OT Treatment Interventions: Self-care/ADL training;DME and/or AE instruction;Therapeutic activities;Patient/family education;Balance training   OT Goals Acute Rehab OT Goals OT Goal Formulation: With patient Time For Goal Achievement: 09/25/12 Potential to Achieve Goals: Good ADL Goals Pt Will Perform Grooming: with min assist;Standing at sink ADL Goal: Grooming - Progress: Goal set today Pt Will Perform Upper Body Bathing: with set-up;Sitting at sink;Sitting, chair;Sitting, edge of bed ADL Goal: Upper Body Bathing - Progress: Goal set today Pt Will Perform Lower Body Bathing: with min assist;Sit to stand from chair;Sit to stand from bed ADL Goal: Lower Body Bathing - Progress: Goal set today Pt Will Perform Lower Body Dressing: with min assist;Sit to stand from chair;Sit to stand from bed ADL Goal: Lower Body Dressing - Progress: Goal set today Pt Will Transfer to Toilet: with min assist;Ambulation;Comfort height toilet ADL Goal: Toilet Transfer - Progress: Goal set today Pt Will Perform Toileting - Clothing Manipulation: with min assist;Standing ADL Goal: Toileting - Clothing Manipulation - Progress: Goal set today  Visit Information  Last OT Received On: 09/11/12 Assistance Needed: +2 PT/OT Co-Evaluation/Treatment: Yes    Subjective Data      Prior Functioning     Home Living Lives With: Spouse Available Help at Discharge: Available PRN/intermittently (spouse works full time) Type of Home: House Home Access: Stairs to enter Secretary/administrator of Steps: 1 Entrance Stairs-Rails: Can reach both Home Layout: One level Bathroom Shower/Tub: Health visitor: Standard Bathroom Accessibility: Yes How Accessible: Accessible via  walker Home Adaptive Equipment: Walker - rolling;Built-in shower seat Prior Function Level of Independence: Independent Able to Take Stairs?: Yes Driving: Yes Vocation: Full time  employment Communication Communication: Other (comment) (non-rebreather mask) Dominant Hand: Right         Vision/Perception     Cognition  Overall Cognitive Status: Appears within functional limits for tasks assessed/performed Difficult to assess due to: Other (comment) (pt with soft voice and on nonrebreather) Arousal/Alertness: Awake/alert Behavior During Session: WFL for tasks performed Cognition - Other Comments: Appears intact, but difficult to accurately asses due to non re-breather    Extremity/Trunk Assessment Right Upper Extremity Assessment RUE ROM/Strength/Tone: Within functional levels RUE Coordination: WFL - gross/fine motor Left Upper Extremity Assessment LUE ROM/Strength/Tone: Within functional levels LUE Coordination: WFL - gross/fine motor Right Lower Extremity Assessment RLE ROM/Strength/Tone: Deficits RLE ROM/Strength/Tone Deficits: grossly 3+/5 Left Lower Extremity Assessment LLE ROM/Strength/Tone: Deficits LLE ROM/Strength/Tone Deficits: grossly 3+/5     Mobility Bed Mobility Bed Mobility: Supine to Sit;Sitting - Scoot to Edge of Bed Supine to Sit: 1: +2 Total assist Supine to Sit: Patient Percentage: 40% Sitting - Scoot to Edge of Bed: 1: +2 Total assist Sitting - Scoot to Edge of Bed: Patient Percentage: 50% Details for Bed Mobility Assistance: Assist to bring trunk up.  Pt able to use arms to push toward EOB. Transfers Transfers: Sit to Stand;Stand to Sit Sit to Stand: 1: +2 Total assist;With upper extremity assist;From bed Sit to Stand: Patient Percentage: 50% Stand to Sit: 1: +2 Total assist;With upper extremity assist;To chair/3-in-1 Stand to Sit: Patient Percentage: 60% Details for Transfer Assistance: Pt with small shuffling steps turning from bed to chair.   Flexed posture and bil forearm support.     Shoulder Instructions     Exercise     Balance Balance Balance Assessed: Yes Static Sitting Balance Static Sitting - Balance Support: Bilateral upper extremity supported Static Sitting - Level of Assistance: 5: Stand by assistance   End of Session OT - End of Session Activity Tolerance: Patient tolerated treatment well Patient left: in chair;with call bell/phone within reach;with family/visitor present Nurse Communication: Mobility status  GO     Joas Motton, Ursula Alert M 09/11/2012, 4:30 PM

## 2012-09-11 NOTE — Progress Notes (Addendum)
Prior to extubation, continuous sedation was discontinued and wasted in the sink at 0930 per order by Dr. Bard Herbert.  Approximately 100cc of Fentanyl and 30cc if Diprivan were wasted with Larina Bras, RN as a witness.  Estelle June, RN   SMITH, Margurite Auerbach, RN

## 2012-09-11 NOTE — Evaluation (Signed)
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  Pt just extubated this AM.  Expect pt will make steady progress if respiratory status remains stable.  May need CIR prior to return home.    PT Assessment  Patient needs continued PT services    Follow Up Recommendations  Post acute inpatient    Does the patient have the potential to tolerate intense rehabilitation   Yes, Recommend IP Rehab Screening  Barriers to Discharge Decreased caregiver support Wife works.  Unsure if she can provide 24 hour assist or if other family available.    Equipment Recommendations  Rolling walker with 5" wheels    Recommendations for Other Services     Frequency Min 3X/week    Precautions / Restrictions Precautions Precautions: Fall   Pertinent Vitals/Pain Pain in abdomen with coughing or mobility.  RR 19-22 with transfer.  SaO2 >90% with activity on nonrebreather at 15L.      Mobility  Bed Mobility Bed Mobility: Supine to Sit;Sitting - Scoot to Edge of Bed Supine to Sit: 1: +2 Total assist Supine to Sit: Patient Percentage: 40% Sitting - Scoot to Edge of Bed: 1: +2 Total assist Sitting - Scoot to Edge of Bed: Patient Percentage: 50% Details for Bed Mobility Assistance: Assist to bring trunk up.  Pt able to use arms to push toward EOB. Transfers Transfers: Sit to Stand;Stand to Sit;Stand Pivot Transfers Sit to Stand: 1: +2 Total assist;With upper extremity assist;From bed Sit to Stand: Patient Percentage: 50% Stand to Sit: 1: +2 Total assist;With upper extremity assist;To chair/3-in-1 Stand to Sit: Patient Percentage: 60% Stand Pivot Transfers: 1: +2 Total assist Stand Pivot Transfers: Patient Percentage: 50% Details for Transfer Assistance: Pt  with small shuffling steps turning from bed to chair.  Flexed posture and bil forearm support.    Shoulder Instructions     Exercises     PT Diagnosis: Difficulty walking;Generalized weakness;Acute pain  PT Problem List: Decreased strength;Decreased activity tolerance;Decreased balance;Decreased mobility;Decreased knowledge of use of DME;Pain;Cardiopulmonary status limiting activity PT Treatment Interventions: DME instruction;Gait training;Functional mobility training;Patient/family education;Therapeutic activities;Therapeutic exercise;Balance training   PT Goals Acute Rehab PT Goals PT Goal Formulation: With patient Time For Goal Achievement: 09/25/12 Potential to Achieve Goals: Good Pt will go Supine/Side to Sit: with supervision PT Goal: Supine/Side to Sit - Progress: Goal set today Pt will go Sit to Supine/Side: with supervision PT Goal: Sit to Supine/Side - Progress: Goal set today Pt will go Sit to Stand: with supervision PT Goal: Sit to Stand - Progress: Goal set today Pt will go Stand to Sit: with supervision PT Goal: Stand to Sit - Progress: Goal set today Pt will Ambulate: 51 - 150 feet;with supervision;with least restrictive assistive device PT Goal: Ambulate - Progress: Goal set today  Visit Information  Last PT Received On: 09/11/12 Assistance Needed: +2 PT/OT Co-Evaluation/Treatment: Yes    Subjective Data  Subjective: 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 Patient Stated Goal: Pt didn't state but agreeable to work toward return home    Prior Functioning  Home Living Lives With: Spouse Available Help at Discharge: Available PRN/intermittently (spouse works full time) Type of Home: House Home Access: Stairs to enter Secretary/administrator of Steps: 1 Entrance Stairs-Rails: Can reach both Home Layout: One level Bathroom Shower/Tub: Psychologist, counselling  Bathroom Toilet: Pharmacist, community: Yes How Accessible: Accessible via walker Home Adaptive  Equipment: Walker - rolling;Built-in shower seat Prior Function Level of Independence: Independent Able to Take Stairs?: Yes Driving: Yes Vocation: Full time employment Comments: works in Production assistant, radio: Other (comment) (non-rebreather mask) Dominant Hand: Right    Cognition  Overall Cognitive Status: Difficult to assess Difficult to assess due to: Other (comment) (pt with soft voice and on nonrebreather) Arousal/Alertness: Awake/alert Behavior During Session: WFL for tasks performed    Extremity/Trunk Assessment Right Lower Extremity Assessment RLE ROM/Strength/Tone: Deficits RLE ROM/Strength/Tone Deficits: grossly 3+/5 Left Lower Extremity Assessment LLE ROM/Strength/Tone: Deficits LLE ROM/Strength/Tone Deficits: grossly 3+/5   Balance Static Sitting Balance Static Sitting - Balance Support: Bilateral upper extremity supported Static Sitting - Level of Assistance: 5: Stand by assistance  End of Session PT - End of Session Activity Tolerance: Patient limited by fatigue Patient left: in chair;with call bell/phone within reach;with family/visitor present Nurse Communication: Mobility status  GP     Willeen Novak 09/11/2012, 1:41 PM  [employer] PT [phone]

## 2012-09-11 NOTE — Progress Notes (Signed)
Referring provider: Tinnie Gens Reason for consultation: Post-op respiratory failure Chief complaint: Abdominal pain  Brandon [name] is a 71 y.o. male smoker admitted on 09/08/2012 with abdominal pain and syncope 2nd to ruptured AAA.  Had emergent repair, and remained on vent post-op.  PCCM consulted for vent management.  No known significant PMHx.  Line/tube: ETT 10/26 >> 10/28 Rt radial aline 10/26 >> 10/28 Lt IJ introducer 10/26 >>   Cultures: MRSA screen 10/25 >> negative  Antibiotics: Cefuroxime 10/26 >> 10/26  Consultants: VVS - primary  Best Practice: SUP - protonix DVT - SCD  Tests: 10/25 CT abd/pelvis>>ruptured 8.9 cm infrarenal AAA  Events: 10/26 Resection and grafting of ruptured AAA with insertion of an aorto by common iliac graft, and b/l thrombectomy of the iliac system; EBL 5 liters.   SUBJECTIVE: Passed SBT. + F/C. RASS -1 (on low dose propofol/fent). Extubated - WOB OK but oxygenation marginal  OBJECTIVE:  Blood pressure 164/78, pulse 101, temperature 98.8 F (37.1 C), temperature source Oral, resp. rate 23, height 6' (1.829 m), weight 102.2 kg (225 lb 5 oz), SpO2 94.00%. Wt Readings from Last 3 Encounters:  09/11/12 102.2 kg (225 lb 5 oz)  09/11/12 102.2 kg (225 lb 5 oz)   Body mass index is 30.56 kg/(m^2).  I/O last 3 completed shifts: In: 5394.8 [I.V.:4016.8; MF:[id number]; NG/GT:30; IV Piggyback:62] Out: [id number] [Urine:3105]  Vent Mode:  [-] CPAP FiO2 (%):  [40 %-91 %] 50 % Set Rate:  [12 bmp] 12 bmp Vt Set:  [id number] mL] 620 mL PEEP:  [5 cmH20] 5 cmH20 Pressure Support:  [5 cmH20-10 cmH20] 10 cmH20  General - No distress HEENT - WNL Cardiac - s1s2 regular, no murmur Chest - diffuse coarse wheezes Abd - soft, mild distention, non tender GU - foley in place Ext - warm, no edema Neuro - MAEs Skin - no rashes  Lab Results  Component Value Date   WBC 14.0* 09/11/2012   HGB 9.2* 09/11/2012   HCT 27.8* 09/11/2012   MCV 91.1 09/11/2012   PLT 74* 09/11/2012   Lab Results  Component Value Date   CREATININE 0.94 09/11/2012   BUN 29* 09/11/2012   NA 143 09/11/2012   K 3.8 09/11/2012   CL 109 09/11/2012   CO2 30 09/11/2012   Lab Results  Component Value Date   ALT 25 09/10/2012   AST 29 09/10/2012   ALKPHOS 40 09/10/2012   BILITOT 0.4 09/10/2012   ABG    Component Value Date/Time   PHART 7.413 09/11/2012 0708   PCO2ART 47.5* 09/11/2012 0708   PO2ART 72.0* 09/11/2012 0708   HCO3 30.3* 09/11/2012 0708   TCO2 32 09/11/2012 0708   ACIDBASEDEF 7.0* 09/08/2012 2312   O2SAT 94.0 09/11/2012 0708    CBG (last 3)   Basename 09/11/12 1135 09/11/12 0018 09/10/12 1938  GLUCAP 119* 125* 109*    CXR: mild edema pattern. Small L effusion. Bibasilar atx   ASSESSMENT/PLAN:  Acute post-operative respiratory failure with hx of smoking. . P: Extubated this AM - follow closely Adjust oxygen to keep SpO2 > 90% Add BDs Needs counseling re: smoking cessation  Ruptured infrarenal AAA s/p repair P: Post-op care per VVS  Elevated blood pressure. Improved 10/27. P: F/u Echo Cont scheduled lopressor Cont Prn hydralazine, labetalol  Consumption coagulopathy from ruptured AAA, thrombocytopenia No acitve bleeding. No indication for blood products @ present P: Follow CBC  Nutrition. P: NPO post extubation Leave NGT post extubation  Hyperglycemia. Resolved. No indication for SSI  P: Cont to monitor glu. Consider SSI if > 180   Sedation, pain. P: Post extubation analgesia with fentanyl  Critical care time 35 minutes   Merton Border, MD ; [facility] [phone].  After 5:30 PM or weekends, call [phone]

## 2012-09-12 DIAGNOSIS — R7309 Other abnormal glucose: Secondary | ICD-10-CM

## 2012-09-12 LAB — TYPE AND SCREEN
ABO/RH(D): O POS
Antibody Screen: NEGATIVE
Unit division: 0
Unit division: 0
Unit division: 0
Unit division: 0
Unit division: 0
Unit division: 0
Unit division: 0
Unit division: 0
Unit division: 0
Unit division: 0
Unit division: 0
Unit division: 0
Unit division: 0
Unit division: 0
Unit division: 0
Unit division: 0

## 2012-09-12 LAB — BASIC METABOLIC PANEL
BUN: 29 mg/dL — ABNORMAL HIGH (ref 6–23)
CO2: 31 mEq/L (ref 19–32)
Calcium: 8.1 mg/dL — ABNORMAL LOW (ref 8.4–10.5)
Chloride: 107 mEq/L (ref 96–112)
Creatinine, Ser: 0.76 mg/dL (ref 0.50–1.35)
GFR calc Af Amer: >90 mL/min (ref >90)
GFR calc non Af Amer: >90 mL/min (ref >90)
Glucose, Bld: 132 mg/dL — ABNORMAL HIGH (ref 70–99)
Potassium: 3.5 mEq/L (ref 3.5–5.1)
Sodium: 144 mEq/L (ref 135–145)

## 2012-09-12 LAB — CBC
HCT: 27.9 % — ABNORMAL LOW (ref 39.0–52.0)
Hemoglobin: 9.1 g/dL — ABNORMAL LOW (ref 13.0–17.0)
MCH: 29.9 pg (ref 26.0–34.0)
MCHC: 32.6 g/dL (ref 30.0–36.0)
MCV: 91.8 fL (ref 78.0–100.0)
Platelets: 105 10*3/uL — ABNORMAL LOW (ref 150–400)
RBC: 3.04 MIL/uL — ABNORMAL LOW (ref 4.22–5.81)
RDW: 15.7 % — ABNORMAL HIGH (ref 11.5–15.5)
WBC: 13.7 10*3/uL — ABNORMAL HIGH (ref 4.0–10.5)

## 2012-09-12 LAB — GLUCOSE, CAPILLARY
Glucose-Capillary: 103 mg/dL — ABNORMAL HIGH (ref 70–99)
Glucose-Capillary: 107 mg/dL — ABNORMAL HIGH (ref 70–99)
Glucose-Capillary: 114 mg/dL — ABNORMAL HIGH (ref 70–99)
Glucose-Capillary: 115 mg/dL — ABNORMAL HIGH (ref 70–99)
Glucose-Capillary: 124 mg/dL — ABNORMAL HIGH (ref 70–99)

## 2012-09-12 MED ORDER — FUROSEMIDE 10 MG/ML IJ SOLN
20.0000 mg | Freq: Once | INTRAMUSCULAR | Status: AC
  Administered 2012-09-12: 20 mg via INTRAVENOUS
  Filled 2012-09-12: qty 2

## 2012-09-12 MED ORDER — SODIUM CHLORIDE 0.45 % IV SOLN
INTRAVENOUS | Status: DC
  Administered 2012-09-12 – 2012-09-13 (×2): via INTRAVENOUS

## 2012-09-12 MED ORDER — POTASSIUM CHLORIDE 10 MEQ/50ML IV SOLN
10.0000 meq | INTRAVENOUS | Status: AC
  Administered 2012-09-12 (×2): 10 meq via INTRAVENOUS
  Filled 2012-09-12: qty 100

## 2012-09-12 MED ORDER — BISACODYL 10 MG RE SUPP
10.0000 mg | Freq: Once | RECTAL | Status: AC
  Administered 2012-09-12: 10 mg via RECTAL
  Filled 2012-09-12: qty 1

## 2012-09-12 NOTE — Care Management Note (Signed)
    Page 1 of 2   09/15/2012     4:16:04 PM   CARE MANAGEMENT NOTE 09/15/2012  Patient:  Brandon [name], Brandon [name]   Account Number:  [id number]  Date Initiated:  09/11/2012  Documentation initiated by:  Avie Arenas  Subjective/Objective Assessment:   Emergent AAA     Action/Plan:   Anticipated DC Date:  09/15/2012   Anticipated DC Plan:  IP REHAB FACILITY      DC Planning Services  CM consult      Choice offered to / List presented to:             Status of service:  Completed, signed off Medicare Important Message given?   (If response is "NO", the following Medicare IM given date fields will be blank) Date Medicare IM given:   Date Additional Medicare IM given:    Discharge Disposition:  IP REHAB FACILITY  Per UR Regulation:  Reviewed for med. necessity/level of care/duration of stay  If discussed at Long Length of Stay Meetings, dates discussed:   09/14/2012    Comments:  Contact - Si Raider Spouse [phone]   [phone]  PCP:  Dr. Holley Bouche  09/15/12 Genecis Veley,RN,BSN [phone] PT MEDICALLY STABLE AND DISCHARGED TO IP REHAB TODAY.  09/14/12 0920 Henrietta Mayo RN BSN MSN CCM Pt to x-fer to 2000, discussed family plan to provide 24/7 assistance with inpt rehab adm coordinator.  09/13/12 1215 Henrietta Mayo RN BSN MSN CCM Met with pt, sister, and brother @ bedside.  Pt anticipates transfer to CIR, family indicates they are attempting to arrange 24/7 assistance when pt d/c'd from rehab.  09-12-12 2pm Avie Arenas, RNBSN  [phone] Spoke with patient at bedside and brother.  patient is up in chair.   Extubated on 10-28.  In house rehab looking at as good potential candidate, has wife,  however she does work. Has one brother who works here and sisters. CM will continue to follow for progression.  Will need In house rehab order.

## 2012-09-12 NOTE — Progress Notes (Signed)
Physical Therapy Treatment Patient Details Name: Brandon [name] MRN: [id number] DOB: [birth year] Today's Date: 09/12/2012 Time: [id number] PT Time Calculation (min): 23 min  PT Assessment / Plan / Recommendation Comments on Treatment Session  Pt s/p emergent repair of ruptured AAA.  Pt making good progress and is good rehab candidate.    Follow Up Recommendations  Post acute inpatient     Does the patient have the potential to tolerate intense rehabilitation  Yes, Recommend IP Rehab Screening  Barriers to Discharge        Equipment Recommendations  3 in 1 bedside comode (pt states he has rolling walker available at home)    Recommendations for Other Services    Frequency Min 3X/week   Plan Discharge plan remains appropriate;Frequency remains appropriate    Precautions / Restrictions Precautions Precautions: Fall   Pertinent Vitals/Pain SaO2 to 80% with amb on 6L.  Recovered to 90% after sitting for 1 minute on 5L.    Mobility  Bed Mobility Bed Mobility: Sit to Sidelying Left Sit to Sidelying Left: 1: +2 Total assist Sit to Sidelying Left: Patient Percentage: 70% Details for Bed Mobility Assistance: Assist to lower trunk and bring legs up. Transfers Sit to Stand: 1: +2 Total assist;With upper extremity assist;From chair/3-in-1;With armrests Sit to Stand: Patient Percentage: 70% Stand to Sit: 4: Min assist;With upper extremity assist;To bed Details for Transfer Assistance: Used 1 hand on armrest and 1 holding pillow to support incision when coming to stand. Ambulation/Gait Ambulation/Gait Assistance: 1: +2 Total assist Ambulation/Gait: Patient Percentage: 70% Ambulation Distance (Feet): 50 Feet Assistive device: Rolling walker Ambulation/Gait Assistance Details: Verbal cues to stay more upright. Gait Pattern: Step-through pattern;Decreased stride length;Trunk flexed    Exercises     PT Diagnosis:    PT Problem List:   PT Treatment Interventions:     PT  Goals Acute Rehab PT Goals PT Goal: Sit to Supine/Side - Progress: Progressing toward goal PT Goal: Sit to Stand - Progress: Progressing toward goal PT Goal: Stand to Sit - Progress: Progressing toward goal PT Goal: Ambulate - Progress: Progressing toward goal  Visit Information  Last PT Received On: 09/12/12 Assistance Needed: +2    Subjective Data  Subjective: Pt states he feels better.   Cognition  Overall Cognitive Status: Appears within functional limits for tasks assessed/performed Arousal/Alertness: Awake/alert Orientation Level: Appears intact for tasks assessed Behavior During Session: [facility] for tasks performed    Balance  Static Standing Balance Static Standing - Balance Support: Bilateral upper extremity supported (on walker) Static Standing - Level of Assistance: 4: Min assist  End of Session PT - End of Session Activity Tolerance: Patient tolerated treatment well Patient left: in bed;with call bell/phone within reach;with family/visitor present Nurse Communication: Mobility status   GP     Alexei Ey 09/12/2012, 2:15 PM  [employer] PT [phone]

## 2012-09-12 NOTE — Addendum Note (Signed)
Addendum  created 09/12/12 0817 by Jolly Carlini A Berma Harts, CRNA   Modules edited:Anesthesia Events    

## 2012-09-12 NOTE — Progress Notes (Addendum)
[facility] [place]  Post-op  Intra-abdominal Surgery note  Date of Surgery: 09/08/2012 - 09/09/2012 Surgeon: Juliann Mule): [name], MD POD: 4 Days Post-Op Procedure(s): ANEURYSM ABDOMINAL AORTIC REPAIR  History of Present Illness  Brandon [name] is a 71 y.o. male who is  up s/p Procedure(s): ANEURYSM ABDOMINAL AORTIC REPAIR Pt is doing well. complains of incisional pain; denies nausea/vomiting; denies diarrhea. has not had flatus;has not had BM C/O sore throat and hoarseness.  IMAGING: Dg Chest Port 1 View  09/11/2012  *RADIOLOGY REPORT*  Clinical Data: Follow-up atelectasis.  Ventilator.  PORTABLE CHEST - 1 VIEW  Comparison: 09/10/2012.  Findings: Endotracheal tube is in satisfactory position. Nasogastric tube is followed into the stomach.  Right IJ catheter sheath remains in place.  Heart size stable.  There is moderate diffuse bilateral air space disease, bibasilar dependent.  Streaky densities are seen at the right lung base.  Left lower lobe collapse/consolidation.  Left pleural effusion.  IMPRESSION:  1.  Probable combination of pulmonary edema, left lower lobe collapse/consolidation and right basilar atelectasis. 2.  Small left pleural effusion.   Original Report Authenticated By: Luretha Rued, M.D.     Significant Diagnostic Studies: CBC Lab Results  Component Value Date   WBC 13.7* 09/12/2012   HGB 9.1* 09/12/2012   HCT 27.9* 09/12/2012   MCV 91.8 09/12/2012   PLT 105* 09/12/2012    BMET    Component Value Date/Time   NA 144 09/12/2012 0410   K 3.5 09/12/2012 0410   CL 107 09/12/2012 0410   CO2 31 09/12/2012 0410   GLUCOSE 132* 09/12/2012 0410   BUN 29* 09/12/2012 0410   CREATININE 0.76 09/12/2012 0410   CALCIUM 8.1* 09/12/2012 0410   GFRNONAA >90 09/12/2012 0410   GFRAA >90 09/12/2012 0410    COAG Lab Results  Component Value Date   INR 1.47 09/11/2012   INR 1.65* 09/10/2012   INR 1.35 09/09/2012   No results found  for this basename: PTT    I/O last 3 completed shifts: In: 3207.6 [I.V.:2637.6; NG/GT:550; IV Piggyback:20] Out: [id number] [Urine:5765; Emesis/NG output:120]    Physical Examination BP Readings from Last 3 Encounters:  09/12/12 124/61  09/08/12 115/85  09/12/12 124/61   Temp Readings from Last 3 Encounters:  09/12/12 99.2 F (37.3 C) Oral  09/12/12 99.2 F (37.3 C) Oral   SpO2 Readings from Last 3 Encounters:  09/12/12 94%  09/08/12 86%  09/12/12 94%   Pulse Readings from Last 3 Encounters:  09/12/12 94  09/08/12 80  09/12/12 94    General: A&O x 3, WDWN male in NAD Pulmonary: normal non-labored breathing , without Rales, rhonchi,  wheezing Cardiac: Heart rate : regular ,  Abdomen:abdomen soft, non-tender and normal active bowel sounds Abdominal wound:clean, dry, intact - small blister  Neurologic: A&O X 3; Appropriate Affect ; SENSATION: normal; MOTOR FUNCTION:  moving all extremities equally. Speech is fluent/normal  Vascular Exam:BLE warm and well perfused Extremities without ischemic changes, no Gangrene, no cellulitis; no open wounds;   LOWER EXTREMITY PULSES           RIGHT                                      LEFT      POSTERIOR TIBIAL  palpable  palpable   Assessment/Plan: Brandon [name] is a 71 y.o. male who is 4  Days Post-Op Procedure(s): ANEURYSM ABDOMINAL AORTIC REPAIR Ileus resolving with good BS - dulcolax today VSS NGT output <100cc/24hours Post-op acute blood loss anemia stable U/O adequate still several liters up on I/O -? More lasix today PT/OT for ambulation Keep foley one more day for strict I/O    Brandon [name] [id number] 09/12/2012 7:38 AM      Agree with above Patient continues to get along very well Extubated yesterday and stable from pulmonary standpoint with good O2 sats Abdomen less tense NG  only 100 cc Lower extremities with 3+ PT pulse palpable bilaterally  Plan #1 continue diuresis #2 continue pulmonary toilet #3  increase out of bed #4 hope to transfer 2000 and a.m. #5 Dulcolax suppository today-n.p.o. except for ice chips

## 2012-09-12 NOTE — Progress Notes (Signed)
Referring provider: Tinnie Gens Reason for consultation: Post-op respiratory failure Chief complaint: Abdominal pain  Brandon [name] is a 71 y.o. male smoker admitted on 09/08/2012 with abdominal pain and syncope 2nd to ruptured AAA.  Had emergent repair, and remained on vent post-op.  PCCM consulted for vent management.  No known significant PMHx.  Line/tube: ETT 10/26 >> 10/28 Rt radial aline 10/26 >> 10/28 Lt IJ introducer 10/26 >>   Cultures: MRSA screen 10/25 >> negative  Antibiotics: Cefuroxime 10/26 >> 10/26  Consultants: VVS - primary  Best Practice: SUP - protonix DVT - SCD  Tests: 10/25 CT abd/pelvis>>ruptured 8.9 cm infrarenal AAA  Events: 10/26 Resection and grafting of ruptured AAA with insertion of an aorto by common iliac graft, and b/l thrombectomy of the iliac system; EBL 5 liters. 10/28- extubated successfully  SUBJECTIVE: Reduced O2 needs, neg 2.8 liters  OBJECTIVE:  Blood pressure 122/67, pulse 92, temperature 99.7 F (37.6 C), temperature source Oral, resp. rate 28, height 6' (1.829 m), weight 98.9 kg (218 lb 0.6 oz), SpO2 99.00%. Wt Readings from Last 3 Encounters:  09/12/12 98.9 kg (218 lb 0.6 oz)  09/12/12 98.9 kg (218 lb 0.6 oz)   Body mass index is 29.57 kg/(m^2).  I/O last 3 completed shifts: In: 3207.6 [I.V.:2637.6; NG/GT:550; IV Piggyback:20] Out: [id number] [Urine:5765; Emesis/NG output:120]  Vent Mode:  [-]  FiO2 (%):  [35 %-100 %] 35 %  General - No distress in chair HEENT - WNL Cardiac - s1s2 regular, no murmur Chest - diffuse ronchi Abd - soft, mild distention, non tender, BS hypo but present GU - foley in place Ext - warm, no edema Neuro - MAEs Skin - no rashes  Lab Results  Component Value Date   WBC 13.7* 09/12/2012   HGB 9.1* 09/12/2012   HCT 27.9* 09/12/2012   MCV 91.8 09/12/2012   PLT 105* 09/12/2012   Lab Results  Component Value Date   CREATININE 0.76 09/12/2012   BUN 29* 09/12/2012   NA 144 09/12/2012   K 3.5 09/12/2012   CL 107 09/12/2012   CO2 31 09/12/2012   Lab Results  Component Value Date   ALT 25 09/10/2012   AST 29 09/10/2012   ALKPHOS 40 09/10/2012   BILITOT 0.4 09/10/2012   ABG    Component Value Date/Time   PHART 7.413 09/11/2012 0708   PCO2ART 47.5* 09/11/2012 0708   PO2ART 72.0* 09/11/2012 0708   HCO3 30.3* 09/11/2012 0708   TCO2 32 09/11/2012 0708   ACIDBASEDEF 7.0* 09/08/2012 2312   O2SAT 94.0 09/11/2012 0708    CBG (last 3)   Basename 09/12/12 0740 09/11/12 2348 09/11/12 1135  GLUCAP 124* 114* 119*    CXR: mild edema pattern. Small L effusion. Bibasilar atx   ASSESSMENT/PLAN:  Acute post-operative respiratory failure with hx of smoking. . P: IS important Mobilize Flutter valve in place Continue neg balance Adjust oxygen to keep SpO2 > 90% Add BDs  Ruptured infrarenal AAA s/p repair P: Post-op care per VVS mobilize  Elevated blood pressure. Improved 10/27. controlled P: F/u Echo- 50%-60%, no pfo Cont scheduled lopressor Cont Prn hydralazine, labetalol Lasix if needed to neg 1 liter-.15 liters  Consumption coagulopathy from ruptured AAA, thrombocytopenia No acitve bleeding. No indication for blood products @ present P: Follow CBC improved consider heparin sub q , will d/w vasc surg  Nutrition. ileus P: NPO post extubation Consider dc NGT Increase mobility  Hyperglycemia. Resolved. No indication for SSI P: Cont to monitor glu. Consider SSI  if > 180  controlled  Sedation, pain. P: Post extubation analgesia with fentanyl ongoing Mobilize Ongoing pt  Hypokalemia replace k  Lasix may be needed, is auto diuresing Chem in am    Lavon Paganini. Titus Mould, MD, [place] Pgr: [facility]

## 2012-09-12 NOTE — Addendum Note (Signed)
Addendum  created 09/12/12 0817 by Adair Laundry, CRNA   Modules edited:Anesthesia Events

## 2012-09-13 ENCOUNTER — Inpatient Hospital Stay (HOSPITAL_COMMUNITY): Payer: Medicare Other

## 2012-09-13 ENCOUNTER — Encounter (HOSPITAL_COMMUNITY): Payer: Self-pay | Admitting: Physical Medicine and Rehabilitation

## 2012-09-13 DIAGNOSIS — J449 Chronic obstructive pulmonary disease, unspecified: Secondary | ICD-10-CM

## 2012-09-13 DIAGNOSIS — J4489 Other specified chronic obstructive pulmonary disease: Secondary | ICD-10-CM

## 2012-09-13 DIAGNOSIS — J9801 Acute bronchospasm: Secondary | ICD-10-CM

## 2012-09-13 DIAGNOSIS — R5381 Other malaise: Secondary | ICD-10-CM

## 2012-09-13 LAB — CBC WITH DIFFERENTIAL/PLATELET
Basophils Absolute: 0 10*3/uL (ref 0.0–0.1)
Basophils Relative: 0 % (ref 0–1)
Eosinophils Absolute: 0 10*3/uL (ref 0.0–0.7)
Eosinophils Relative: 0 % (ref 0–5)
HCT: 27.5 % — ABNORMAL LOW (ref 39.0–52.0)
Hemoglobin: 9 g/dL — ABNORMAL LOW (ref 13.0–17.0)
Lymphocytes Relative: 8 % — ABNORMAL LOW (ref 12–46)
Lymphs Abs: 1 10*3/uL (ref 0.7–4.0)
MCH: 30.1 pg (ref 26.0–34.0)
MCHC: 32.7 g/dL (ref 30.0–36.0)
MCV: 92 fL (ref 78.0–100.0)
Monocytes Absolute: 1.2 10*3/uL — ABNORMAL HIGH (ref 0.1–1.0)
Monocytes Relative: 11 % (ref 3–12)
Neutro Abs: 9.6 10*3/uL — ABNORMAL HIGH (ref 1.7–7.7)
Neutrophils Relative %: 81 % — ABNORMAL HIGH (ref 43–77)
Platelets: 137 10*3/uL — ABNORMAL LOW (ref 150–400)
RBC: 2.99 MIL/uL — ABNORMAL LOW (ref 4.22–5.81)
RDW: 15.3 % (ref 11.5–15.5)
WBC: 11.8 10*3/uL — ABNORMAL HIGH (ref 4.0–10.5)

## 2012-09-13 LAB — COMPREHENSIVE METABOLIC PANEL
ALT: 21 U/L (ref 0–53)
AST: 31 U/L (ref 0–37)
Albumin: 2.3 g/dL — ABNORMAL LOW (ref 3.5–5.2)
Alkaline Phosphatase: 55 U/L (ref 39–117)
BUN: 33 mg/dL — ABNORMAL HIGH (ref 6–23)
CO2: 29 mEq/L (ref 19–32)
Calcium: 8.1 mg/dL — ABNORMAL LOW (ref 8.4–10.5)
Chloride: 104 mEq/L (ref 96–112)
Creatinine, Ser: 0.69 mg/dL (ref 0.50–1.35)
GFR calc Af Amer: >90 mL/min (ref >90)
GFR calc non Af Amer: >90 mL/min (ref >90)
Glucose, Bld: 96 mg/dL (ref 70–99)
Potassium: 3.3 mEq/L — ABNORMAL LOW (ref 3.5–5.1)
Sodium: 142 mEq/L (ref 135–145)
Total Bilirubin: 1.2 mg/dL (ref 0.3–1.2)
Total Protein: 5.4 g/dL — ABNORMAL LOW (ref 6.0–8.3)

## 2012-09-13 LAB — GLUCOSE, CAPILLARY
Glucose-Capillary: 94 mg/dL (ref 70–99)
Glucose-Capillary: 101 mg/dL — ABNORMAL HIGH (ref 70–99)
Glucose-Capillary: 96 mg/dL (ref 70–99)
Glucose-Capillary: 104 mg/dL — ABNORMAL HIGH (ref 70–99)
Glucose-Capillary: 119 mg/dL — ABNORMAL HIGH (ref 70–99)
Glucose-Capillary: 134 mg/dL — ABNORMAL HIGH (ref 70–99)

## 2012-09-13 LAB — POTASSIUM: Potassium: 3.5 mEq/L (ref 3.5–5.1)

## 2012-09-13 MED ORDER — POTASSIUM CHLORIDE 10 MEQ/50ML IV SOLN
10.0000 meq | INTRAVENOUS | Status: AC
Start: 2012-09-13 — End: 2012-09-13
  Administered 2012-09-13 (×4): 10 meq via INTRAVENOUS

## 2012-09-13 MED ORDER — FUROSEMIDE 10 MG/ML IJ SOLN
20.0000 mg | Freq: Once | INTRAMUSCULAR | Status: AC
  Administered 2012-09-13: 20 mg via INTRAVENOUS
  Filled 2012-09-13: qty 2

## 2012-09-13 MED ORDER — FUROSEMIDE 10 MG/ML IJ SOLN
INTRAMUSCULAR | Status: AC
  Administered 2012-09-13: 20 mg via INTRAVENOUS
  Filled 2012-09-13: qty 4

## 2012-09-13 MED ORDER — POTASSIUM CHLORIDE CRYS ER 20 MEQ PO TBCR
40.0000 meq | EXTENDED_RELEASE_TABLET | Freq: Once | ORAL | Status: AC
  Administered 2012-09-13: 40 meq via ORAL
  Filled 2012-09-13: qty 2

## 2012-09-13 MED ORDER — POTASSIUM CHLORIDE 10 MEQ/50ML IV SOLN
INTRAVENOUS | Status: AC
  Administered 2012-09-13: 10 meq via INTRAVENOUS
  Filled 2012-09-13: qty 200

## 2012-09-13 NOTE — Progress Notes (Signed)
Physical Therapy Treatment Patient Details Name: Brandon [name] MRN: [id number] DOB: November 18, 1940 Today's Date: 09/13/2012 Time: 2841-3244 PT Time Calculation (min): 23 min  PT Assessment / Plan / Recommendation Comments on Treatment Session  Pt s/p emergent repair of ruptured AAA.  Pt continues to make steady progress but fatigues quickly and decr functional activity tolerance.    Follow Up Recommendations  Post acute inpatient     Does the patient have the potential to tolerate intense rehabilitation  Yes, Recommend IP Rehab Screening  Barriers to Discharge        Equipment Recommendations  3 in 1 bedside comode    Recommendations for Other Services    Frequency Min 3X/week   Plan Discharge plan remains appropriate;Frequency remains appropriate    Precautions / Restrictions Precautions Precautions: Fall Restrictions Weight Bearing Restrictions: No   Pertinent Vitals/Pain SaO2 86% on 6L with amb    Mobility  Bed Mobility Bed Mobility: Sit to Sidelying Left Sit to Sidelying Left: 4: Min assist;Other (comment);HOB flat (for LEs) Details for Bed Mobility Assistance: assist to bring legs up Transfers Sit to Stand: 1: +2 Total assist;With upper extremity assist;From chair/3-in-1;With armrests Sit to Stand: Patient Percentage: 70% Stand to Sit: 4: Min assist;With upper extremity assist;To bed Stand to Sit: Patient Percentage: 70% Details for Transfer Assistance: Verbal cues for hand placement Ambulation/Gait Ambulation/Gait Assistance: 4: Min assist;3: Mod assist (2nd person to follow with chair and for safety) Ambulation Distance (Feet): 60 Feet Assistive device: Rolling walker Ambulation/Gait Assistance Details: Verbal cues to stand more erect.  Pt with lt knee beginning to buckle which required brief Mod A. Hyperextension of bil knees with lt worse than rt. Gait Pattern: Step-through pattern;Decreased stride length;Trunk flexed;Left genu recurvatum;Right genu  recurvatum Gait velocity: decr    Exercises     PT Diagnosis:    PT Problem List:   PT Treatment Interventions:     PT Goals Acute Rehab PT Goals PT Goal: Sit to Supine/Side - Progress: Progressing toward goal PT Goal: Sit to Stand - Progress: Progressing toward goal PT Goal: Stand to Sit - Progress: Progressing toward goal PT Goal: Ambulate - Progress: Progressing toward goal  Visit Information  Last PT Received On: 09/13/12 Assistance Needed: +2    Subjective Data  Subjective: "I did better than I thought I would today."   Cognition  Overall Cognitive Status: Appears within functional limits for tasks assessed/performed Arousal/Alertness: Awake/alert Orientation Level: Appears intact for tasks assessed Behavior During Session: [facility] for tasks performed    Balance  Balance Balance Assessed: Yes Static Sitting Balance Static Sitting - Balance Support: Feet supported Static Sitting - Level of Assistance: 5: Stand by assistance Dynamic Sitting Balance Dynamic Sitting - Balance Support: Feet supported Dynamic Sitting - Level of Assistance: 4: Min Oncologist Standing - Balance Support: Bilateral upper extremity supported (on walker) Static Standing - Level of Assistance: 4: Min assist  End of Session PT - End of Session Equipment Utilized During Treatment: Gait belt Activity Tolerance: Patient limited by fatigue Patient left: in bed;with call bell/phone within reach Nurse Communication: Mobility status   GP     [facility] 09/13/2012, 11:37 AM  [employer] PT [phone]

## 2012-09-13 NOTE — Consult Note (Signed)
Physical Medicine and Rehabilitation Consult Reason for Consult: Deconditioning. Referring Physician:  Dr. Hart Rochester   HPI: Brandon [name] is a 71 y.o. 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 He was taken to OR emergently for resection and grafting win insertion of aorto by common iliac graft by Dr. Hart Rochester on 09/09/12. Required 10 units PRBC, 6 units FFP and 2 units of platelets intraoperatively. Intubated through 10/28. Has required diuresis for fluid overload. Is requiring NRB occasionally during sleep. Post op ileus resolving and clear liquids initiated today.  Therapies ongoing and CIR recommended by therapy team.    Review of Systems  HENT: Negative for hearing loss and neck pain.   Eyes: Negative for blurred vision and double vision.  Respiratory: Positive for cough, shortness of breath and wheezing.   Cardiovascular: Positive for chest pain (when coughing). Negative for palpitations.  Gastrointestinal: Positive for abdominal pain. Negative for nausea, vomiting, diarrhea and constipation.  Musculoskeletal: Negative for myalgias, back pain and joint pain.  Neurological: Negative for tremors, seizures and headaches.  Psychiatric/Behavioral: Negative for memory loss. The patient is not nervous/anxious.    History reviewed. No pertinent past medical history.  Past Surgical History  Procedure Date  . Abdominal aortic aneurysm repair 09/08/2012    Procedure: ANEURYSM ABDOMINAL AORTIC REPAIR;  Surgeon: Pryor Ochoa, MD;  Location: [facility] OR;  Service: Vascular;  Laterality: N/A;  open repair ruptured abdomenal aortic aneurysm  . Ganglion cyst excision     Right wrist   Family History  Problem Relation Age of Onset  . Dementia Mother   . Cancer Father     Social History:  Married. Self employed land scaper.  Independent and active PTA. He  reports that he has been smoking --1PPD for ~53  years.  He does not have any smokeless tobacco history on file. He reports that he does not drink alcohol or use illicit drugs.  Allergies: No Known Allergies  Medications Prior to Admission  Medication Sig Dispense Refill  . ibuprofen (ADVIL,MOTRIN) 200 MG tablet Take 400 mg by mouth every morning.        Home: Home Living Lives With: Spouse Available Help at Discharge: Available PRN/intermittently (spouse works full time) Type of Home: House Home Access: Stairs to enter Secretary/administrator of Steps: 1 Entrance Stairs-Rails: Can reach both Home Layout: One level Bathroom Shower/Tub: Health visitor: Standard Bathroom Accessibility: Yes How Accessible: Accessible via walker Home Adaptive Equipment: Walker - rolling;Built-in shower seat  Functional History: Prior Function Able to Take Stairs?: Yes Driving: Yes Vocation: Full time employment Comments: works in [employer] organ[employer] Status:  Mobility: Bed Mobility Bed Mobility: Sit to Sidelying Left Supine to Sit: 1: +2 Total assist Supine to Sit: Patient Percentage: 40% Sitting - Scoot to Edge of Bed: 1: +2 Total assist Sitting - Scoot to Edge of Bed: Patient Percentage: 50% Sit to Sidelying Left: 1: +2 Total assist Sit to Sidelying Left: Patient Percentage: 70% Transfers Transfers: Sit to Stand;Stand to Sit;Stand Pivot Transfers Sit to Stand: 1: +2 Total assist;With upper extremity assist;From chair/3-in-1;With armrests Sit to Stand: Patient Percentage: 70% Stand to Sit: 4: Min assist;With upper extremity assist;To bed Stand to Sit: Patient Percentage: 60% Stand Pivot Transfers: 1: +2 Total assist Stand Pivot Transfers: Patient Percentage: 50% Ambulation/Gait Ambulation/Gait Assistance: 1: +2 Total assist Ambulation/Gait: Patient Percentage: 70% Ambulation Distance (Feet): 50 Feet Assistive device: Rolling walker Ambulation/Gait  Assistance Details: Verbal cues to stay more upright. Gait Pattern:  Step-through pattern;Decreased stride length;Trunk flexed    ADL: ADL Eating/Feeding: Set up Where Assessed - Eating/Feeding: Chair Grooming: Wash/dry hands;Wash/dry face;Set up Where Assessed - Grooming: Supported sitting Upper Body Bathing: Moderate assistance Where Assessed - Upper Body Bathing: Supported sitting Lower Body Bathing: Maximal assistance Where Assessed - Lower Body Bathing: Supported sit to stand Upper Body Dressing: Maximal assistance Where Assessed - Upper Body Dressing: Unsupported sitting Lower Body Dressing: +1 Total assistance Where Assessed - Lower Body Dressing: Unsupported sit to stand Toilet Transfer: +2 Total assistance Toilet Transfer Method: Sit to stand;Stand pivot Toilet Transfer Equipment: Other (comment) (recliner) Transfers/Ambulation Related to ADLs: +2 total A (pt 60%)  Cognition: Cognition Arousal/Alertness: Awake/alert Orientation Level: Oriented X4 Cognition Overall Cognitive Status: Appears within functional limits for tasks assessed/performed Difficult to assess due to: Other (comment) (pt with soft voice and on nonrebreather) Arousal/Alertness: Awake/alert Orientation Level: Appears intact for tasks assessed Behavior During Session: [facility] for tasks performed Cognition - Other Comments: Appears intact, but difficult to accurately asses due to non re-breather  Blood pressure 153/85, pulse 84, temperature 98.9 F (37.2 C), temperature source Oral, resp. rate 29, height 6' (1.829 m), weight 97.8 kg (215 lb 9.8 oz), SpO2 91.00%. Physical Exam  Nursing note and vitals reviewed. Constitutional: He is oriented to person, place, and time. He appears well-developed and well-nourished.  HENT:  Head: Normocephalic and atraumatic.  Eyes: Pupils are equal, round, and reactive to light.  Neck: Normal range of motion.  Cardiovascular: Normal rate and regular rhythm.   Pulmonary/Chest: He has rhonchi in the right upper field, the right middle field,  the left upper field and the left middle field. He has rales in the left lower field.       Dyspnea with conversation.  Abdominal: He exhibits distension. Bowel sounds are decreased.  Musculoskeletal: He exhibits no edema and no tenderness.  Neurological: He is alert and oriented to person, place, and time.  Skin: Skin is warm and dry.  Poor sitting balance and tolerance 4+/5 bilateral deltoid, biceps, triceps, grip 3/5 right hip flexor knee extensor 4/5 ankle dorsiflexor plantar flexor 4/5 in the left lower extremity throughout Sensation is intact to light touch and proprioception in both lower extremities  Results for orders placed during the hospital encounter of 09/13/12  COMPREHENSIVE METABOLIC PANEL     Status: Abnormal   Collection Time   09/13/12  2:55 AM      Component Value Range   Sodium 142  135 - 145 mEq/L   Potassium 3.3 (*) 3.5 - 5.1 mEq/L   Chloride 104  96 - 112 mEq/L   CO2 29  19 - 32 mEq/L   Glucose, Bld 96  70 - 99 mg/dL   BUN 33 (*) 6 - 23 mg/dL   Creatinine, Ser 1.61  0.50 - 1.35 mg/dL   Calcium 8.1 (*) 8.4 - 10.5 mg/dL   Total Protein 5.4 (*) 6.0 - 8.3 g/dL   Albumin 2.3 (*) 3.5 - 5.2 g/dL   AST 31  0 - 37 U/L   ALT 21  0 - 53 U/L   Alkaline Phosphatase 55  39 - 117 U/L   Total Bilirubin 1.2  0.3 - 1.2 mg/dL   GFR calc non Af Amer >90  >90 mL/min   GFR calc Af Amer >90  >90 mL/min  CBC WITH DIFFERENTIAL     Status: Abnormal   Collection Time   09/13/12  2:55 AM      Component Value Range   WBC 11.8 (*) 4.0 - 10.5 K/uL   RBC 2.99 (*) 4.22 - 5.81 MIL/uL   Hemoglobin 9.0 (*) 13.0 - 17.0 g/dL   HCT 95.2 (*) 84.1 - 32.4 %   MCV 92.0  78.0 - 100.0 fL   MCH 30.1  26.0 - 34.0 pg   MCHC 32.7  30.0 - 36.0 g/dL   RDW 40.1  02.7 - 25.3 %   Platelets 137 (*) 150 - 400 K/uL   Neutrophils Relative 81 (*) 43 - 77 %   Neutro Abs 9.6 (*) 1.7 - 7.7 K/uL   Lymphocytes Relative 8 (*) 12 - 46 %   Lymphs Abs 1.0  0.7 - 4.0 K/uL   Monocytes Relative 11  3 - 12 %    Monocytes Absolute 1.2 (*) 0.1 - 1.0 K/uL   Eosinophils Relative 0  0 - 5 %   Eosinophils Absolute 0.0  0.0 - 0.7 K/uL   Basophils Relative 0  0 - 1 %   Basophils Absolute 0.0  0.0 - 0.1 K/uL  GLUCOSE, CAPILLARY     Status: Abnormal   Collection Time   09/13/12  3:27 AM      Component Value Range   Glucose-Capillary 101 (*) 70 - 99 mg/dL  GLUCOSE, CAPILLARY     Status: Normal   Collection Time   09/13/12  7:24 AM      Component Value Range   Glucose-Capillary 96  70 - 99 mg/dL   Dg Chest Port 1 View  09/13/2012  *RADIOLOGY REPORT*  Clinical Data: Pleural effusions, weakness.  PORTABLE CHEST - 1 VIEW  Comparison: 09/11/2012.  Findings: Endotracheal tube and nasogastric tube have been removed. Right IJ catheter sheath tip projects in the region of the brachiocephalic vein junction.  Heart size stable. Diffuse bilateral air space disease persists.  Bibasilar collapse/consolidation, worsening on the right.  Bilateral pleural effusions.  IMPRESSION: Congestive heart failure with bilateral lower lobe collapse/consolidation, worsening on the right.   Original Report Authenticated By: Reyes Ivan, M.D.     Assessment/Plan: Diagnosis: Deconditioning after rupture of AAA 1. Does the need for close, 24 hr/day medical supervision in concert with the patient's rehab needs make it unreasonable for this patient to be served in a less intensive setting? Yes 2. Co-Morbidities requiring supervision/potential complications: Postop respiratory failure, syncope, hypertension, COPD 3. Due to bladder management, bowel management, safety, skin/wound care, disease management, medication administration, pain management and patient education, does the patient require 24 hr/day rehab nursing? Yes 4. Does the patient require coordinated care of a physician, rehab nurse, PT (1-2 hrs/day, 5 days/week) and OT (1-2 hrs/day, 5 days/week) to address physical and functional deficits in the context of the above medical  diagnosis(es)? Yes Addressing deficits in the following areas: balance, endurance, locomotion, strength, transferring, bowel/bladder control, bathing, dressing, feeding, grooming and toileting 5. Can the patient actively participate in an intensive therapy program of at least 3 hrs of therapy per day at least 5 days per week? Yes 6. The potential for patient to make measurable gains while on inpatient rehab is excellent 7. Anticipated functional outcomes upon discharge from inpatient rehab are Supervision to modified independent mobility with PT, Supervision to modified independent ADL with OT, Not applicable with SLP. 8. Estimated rehab length of stay to reach the above functional goals is: 7010 days 9. Does the patient have adequate social supports to accommodate these discharge functional goals? Potentially  10. Anticipated D/C setting: Home 11. Anticipated post D/C treatments: HH therapy 12. Overall Rehab/Functional Prognosis: good  RECOMMENDATIONS: This patient's condition is appropriate for continued rehabilitative care in the following setting: CIR Patient has agreed to participate in recommended program. Potentially Note that insurance prior authorization may be required for reimbursement for recommended care.  Comment:    09/13/2012

## 2012-09-13 NOTE — Progress Notes (Addendum)
[facility] [place]  Post-op  Intra-abdominal Surgery note  Date of Surgery: 09/08/2012 - 09/09/2012 Surgeon: Juliann Mule): [name], MD POD: 5 Days Post-Op Procedure(s): ANEURYSM ABDOMINAL AORTIC REPAIR  History of Present Illness  JOSPH RIMANDO is a 71 y.o. male who is  up s/p Procedure(s): ANEURYSM ABDOMINAL AORTIC REPAIR Pt is doing well. denies incisional pain; denies nausea/vomiting; denies diarrhea. has had flatus;has had BM  IMAGING: No results found.  Significant Diagnostic Studies: CBC Lab Results  Component Value Date   WBC 11.8* 09/13/2012   HGB 9.0* 09/13/2012   HCT 27.5* 09/13/2012   MCV 92.0 09/13/2012   PLT 137* 09/13/2012    BMET    Component Value Date/Time   NA 142 09/13/2012 0255   K 3.3* 09/13/2012 0255   CL 104 09/13/2012 0255   CO2 29 09/13/2012 0255   GLUCOSE 96 09/13/2012 0255   BUN 33* 09/13/2012 0255   CREATININE 0.69 09/13/2012 0255   CALCIUM 8.1* 09/13/2012 0255   GFRNONAA >90 09/13/2012 0255   GFRAA >90 09/13/2012 0255    COAG Lab Results  Component Value Date   INR 1.47 09/11/2012   INR 1.65* 09/10/2012   INR 1.35 09/09/2012   No results found for this basename: PTT    I/O last 3 completed shifts: In: 2236.2 [I.V.:1714.2; NG/GT:400; IV Piggyback:122] Out: [id number]; Emesis/NG output:120]    Physical Examination BP Readings from Last 3 Encounters:  09/13/12 148/81  09/08/12 115/85  09/13/12 148/81   Temp Readings from Last 3 Encounters:  09/13/12 98.9 F (37.2 C) Oral  09/13/12 98.9 F (37.2 C) Oral   SpO2 Readings from Last 3 Encounters:  09/13/12 90%  09/08/12 86%  09/13/12 90%   Pulse Readings from Last 3 Encounters:  09/13/12 96  09/08/12 80  09/13/12 96    General: A&O x 3, WDWN male in NAD Pulmonary: normal non-labored breathing , with Rales, no rhonchi,  No wheezing Cardiac: Heart rate : regular ,  Abdomen:abdomen soft, non-tender and normal active bowel sounds  Abdominal wound:clean, dry, intact  Neurologic: A&O X 3; Appropriate Affect ; SENSATION: normal; MOTOR FUNCTION:  moving all extremities equally. Speech is fluent/normal  Vascular Exam:BLE warm and well perfused Extremities without ischemic changes, no Gangrene, no cellulitis; no open wounds;   LOWER EXTREMITY PULSES           RIGHT                                      LEFT      POSTERIOR TIBIAL  palpable  palpable   Assessment/Plan: TREVEON REINCKE is a 71 y.o. male who is 5 Days Post-Op Procedure(s): ANEURYSM ABDOMINAL AORTIC REPAIR Hypokalemia - replace K+ - see sliding scale in orders Pulm Edema on CXR this am - diuresing well with lasix. Will repeat dose today - may need second dose this afternoon Repeat K+ this afternoon Leave foley today for strict I/O Clear liq diet Ambulate    [employer] [employer] [id number] 09/13/2012 7:33 AM      Agree with above Patient not complaining of any significant pain today No nausea or vomiting-positive bowel movements Did have some decrease in O2 sats last night into the 80s  General alert and oriented x3 Lungs bilateral congestion Abdomen softer but still distended 3+ femoral and posterior tibial pulses palpable  Continues to make good progress Plan Lasix 20  mg twice today Continue pulmonary toilet Continue ambulation Transfer to 3300 today  I will be out of town until Monday and Dr. Donnetta Hutching will follow patient. talked with family yesterday and talked with brother today updating his progress

## 2012-09-13 NOTE — Progress Notes (Signed)
Referring provider: Tinnie Gens Reason for consultation: Post-op respiratory failure Chief complaint: Abdominal pain  Brandon [name] is a 71 y.o. male smoker admitted on 09/08/2012 with abdominal pain and syncope 2nd to ruptured AAA.  Had emergent repair, and remained on vent post-op.  PCCM consulted for vent management.  Line/tube: ETT 10/26 >> 10/28 Rt radial aline 10/26 >> 10/28 Lt IJ introducer 10/26 >> 10/30  Cultures: MRSA screen 10/25 >> negative  Antibiotics: Cefuroxime 10/26 >> 10/26  Consultants: VVS - primary  Best Practice: SUP - protonix DVT - SCD  Tests: 10/25 CT abd/pelvis>>ruptured 8.9 cm infrarenal AAA  Events: 10/26 Resection and grafting of ruptured AAA with insertion of an aorto by common iliac graft, and b/l thrombectomy of the iliac system; EBL 5 liters. 10/28- extubated successfully  SUBJECTIVE:  Still requires intermittent VM when asleep. [place] while awake  OBJECTIVE:  Blood pressure 138/78, pulse 97, temperature 99.3 F (37.4 C), temperature source Oral, resp. rate 23, height 6' (1.829 m), weight 97.8 kg (215 lb 9.8 oz), SpO2 92.00%. Wt Readings from Last 3 Encounters:  09/13/12 97.8 kg (215 lb 9.8 oz)  09/13/12 97.8 kg (215 lb 9.8 oz)   Body mass index is 29.24 kg/(m^2).  I/O last 3 completed shifts: In: 2236.2 [I.V.:1714.2; NG/GT:400; IV Piggyback:122] Out: [id number]; Emesis/NG output:120]  Vent Mode:  [-]  FiO2 (%):  [35 %] 35 %  General - No distress  HEENT - WNL Cardiac - s1s2 regular, no murmur Chest - diffuse coarse wheezes Abd - soft, mild distention, non tender, BS hypo but present GU - foley in place Ext - warm, no edema Neuro - MAEs Skin - no rashes  Lab Results  Component Value Date   WBC 11.8* 09/13/2012   HGB 9.0* 09/13/2012   HCT 27.5* 09/13/2012   MCV 92.0 09/13/2012   PLT 137* 09/13/2012   Lab Results  Component Value Date   CREATININE 0.69 09/13/2012   BUN 33* 09/13/2012   NA 142 09/13/2012   K  3.5 09/13/2012   CL 104 09/13/2012   CO2 29 09/13/2012   Lab Results  Component Value Date   ALT 21 09/13/2012   AST 31 09/13/2012   ALKPHOS 55 09/13/2012   BILITOT 1.2 09/13/2012   ABG    Component Value Date/Time   PHART 7.413 09/11/2012 0708   PCO2ART 47.5* 09/11/2012 0708   PO2ART 72.0* 09/11/2012 0708   HCO3 30.3* 09/11/2012 0708   TCO2 32 09/11/2012 0708   ACIDBASEDEF 7.0* 09/08/2012 2312   O2SAT 94.0 09/11/2012 0708    CBG (last 3)   Basename 09/13/12 1527 09/13/12 1122 09/13/12 0724  GLUCAP 119* 104* 96    CXR: NSC edema pattern. Small L effusion. Bibasilar atx   ASSESSMENT/PLAN:  Acute post-operative respiratory failure with hx of smoking. . P: Continue BDs Cont diuresis as permitted by BP and renal function Wean O2 as toerated for SpO2 > 90%  Ruptured infrarenal AAA s/p repair P: Post-op care per VVS mobilize  Elevated blood pressure. Improved 10/27. controlled P: Cont scheduled lopressor Cont Prn hydralazine, labetalol  Consumption coagulopathy from ruptured AAA, thrombocytopenia No acitve bleeding. No indication for blood products @ present P: Follow CBC   Nutrition. ileus P: Per VVS  Hyperglycemia. Resolved. No indication for SSI  P: Cont to monitor glu. Consider SSI if > 180    Sedation, pain. P: Post extubation analgesia with fentanyl ongoing   Hypokalemia replace k    Agree with plans for SDU  PCCM will sign off. Please call if we can be of further assistance  Merton Border, MD ; [facility] [phone].  After 5:30 PM or weekends, call [phone]

## 2012-09-13 NOTE — Progress Notes (Signed)
Occupational Therapy Treatment Patient Details Name: Brandon [name] MRN: [id number] DOB: [birth year] Today's Date: 09/13/2012 Time: [id number] OT Time Calculation (min): 44 min  OT Assessment / Plan / Recommendation Comments on Treatment Session Pt demonstrating improvement in mobility for ADL and overall activity tolerance.  Pt is highly motivated to regain independent level.     Follow Up Recommendations  Inpatient Rehab    Barriers to Discharge       Equipment Recommendations  3 in 1 bedside comode    Recommendations for Other Services Rehab consult  Frequency Min 2X/week   Plan Discharge plan remains appropriate    Precautions / Restrictions Precautions Precautions: Fall Restrictions Weight Bearing Restrictions: No   Pertinent Vitals/Pain No pain    ADL  Grooming: Performed;Wash/dry face;Brushing hair;Shaving;Minimal assistance Where Assessed - Grooming: Supported sitting Equipment Used: Gait belt;Rolling walker Transfers/Ambulation Related to ADLs: ambulated with min assist and RW, second person for safety, equipment and to follow with chair.  Pt on 6L 02 with sats remaining in mid 69s. ADL Comments: Pt already up in chair.  Very willing to work with OT on grooming tasks in sitting.  02 at 92% on 6L in sitting, upon removal of 02 for shaving upper lip, decreased to mid 80s.  Instructed pt in pacing and purse lip breathing during exertion.    OT Diagnosis:    OT Problem List:   OT Treatment Interventions:     OT Goals Acute Rehab OT Goals OT Goal Formulation: With patient Time For Goal Achievement: 09/25/12 Potential to Achieve Goals: Good ADL Goals Pt Will Perform Grooming: with min assist;Standing at sink ADL Goal: Grooming - Progress: Progressing toward goals Pt Will Perform Upper Body Bathing: with set-up;Sitting at sink;Sitting, chair;Sitting, edge of bed Pt Will Perform Lower Body Bathing: with min assist;Sit to stand from chair;Sit to stand from bed Pt  Will Perform Lower Body Dressing: with min assist;Sit to stand from chair;Sit to stand from bed Pt Will Transfer to Toilet: with min assist;Ambulation;Comfort height toilet ADL Goal: Toilet Transfer - Progress: Progressing toward goals Pt Will Perform Toileting - Clothing Manipulation: with min assist;Standing  Visit Information  Last OT Received On: 09/13/12 Assistance Needed: +2 PT/OT Co-Evaluation/Treatment: Yes    Subjective Data      Prior Functioning       Cognition  Overall Cognitive Status: Appears within functional limits for tasks assessed/performed Arousal/Alertness: Awake/alert Orientation Level: Appears intact for tasks assessed Behavior During Session: [facility] for tasks performed    Mobility  Shoulder Instructions Bed Mobility Bed Mobility: Sit to Sidelying Left Sit to Sidelying Left: 4: Min assist;Other (comment);HOB flat (for LEs) Transfers Transfers: Sit to Stand;Stand to Sit Sit to Stand: 1: +2 Total assist;With upper extremity assist;From chair/3-in-1;With armrests Sit to Stand: Patient Percentage: 70% Stand to Sit: 4: Min assist;With upper extremity assist;To bed Stand to Sit: Patient Percentage: 70%       Exercises      Balance Balance Balance Assessed: Yes Static Sitting Balance Static Sitting - Balance Support: Feet supported Static Sitting - Level of Assistance: 5: Stand by assistance Dynamic Sitting Balance Dynamic Sitting - Balance Support: Feet supported Dynamic Sitting - Level of Assistance: 4: Min assist   End of Session OT - End of Session Activity Tolerance: Patient tolerated treatment well Patient left: in bed;with call bell/phone within reach Nurse Communication: Mobility status  GO     Malka So 09/13/2012, 11:15 AM [phone]

## 2012-09-14 ENCOUNTER — Inpatient Hospital Stay (HOSPITAL_COMMUNITY): Payer: Medicare Other

## 2012-09-14 ENCOUNTER — Encounter (HOSPITAL_COMMUNITY): Payer: Self-pay | Admitting: *Deleted

## 2012-09-14 LAB — COMPREHENSIVE METABOLIC PANEL
ALT: 27 U/L (ref 0–53)
AST: 36 U/L (ref 0–37)
Albumin: 2.3 g/dL — ABNORMAL LOW (ref 3.5–5.2)
Alkaline Phosphatase: 60 U/L (ref 39–117)
BUN: 27 mg/dL — ABNORMAL HIGH (ref 6–23)
CO2: 29 mEq/L (ref 19–32)
Calcium: 8.1 mg/dL — ABNORMAL LOW (ref 8.4–10.5)
Chloride: 106 mEq/L (ref 96–112)
Creatinine, Ser: 0.64 mg/dL (ref 0.50–1.35)
GFR calc Af Amer: >90 mL/min (ref >90)
GFR calc non Af Amer: >90 mL/min (ref >90)
Glucose, Bld: 122 mg/dL — ABNORMAL HIGH (ref 70–99)
Potassium: 3.4 mEq/L — ABNORMAL LOW (ref 3.5–5.1)
Sodium: 142 mEq/L (ref 135–145)
Total Bilirubin: 1.4 mg/dL — ABNORMAL HIGH (ref 0.3–1.2)
Total Protein: 5.5 g/dL — ABNORMAL LOW (ref 6.0–8.3)

## 2012-09-14 LAB — GLUCOSE, CAPILLARY
Glucose-Capillary: 110 mg/dL — ABNORMAL HIGH (ref 70–99)
Glucose-Capillary: 113 mg/dL — ABNORMAL HIGH (ref 70–99)

## 2012-09-14 MED ORDER — METOPROLOL TARTRATE 25 MG PO TABS
25.0000 mg | ORAL_TABLET | Freq: Two times a day (BID) | ORAL | Status: DC
  Administered 2012-09-14 – 2012-09-15 (×3): 25 mg via ORAL
  Filled 2012-09-14 (×4): qty 1

## 2012-09-14 MED ORDER — POTASSIUM CHLORIDE CRYS ER 20 MEQ PO TBCR
40.0000 meq | EXTENDED_RELEASE_TABLET | Freq: Once | ORAL | Status: AC
  Administered 2012-09-14: 40 meq via ORAL
  Filled 2012-09-14: qty 2

## 2012-09-14 NOTE — Progress Notes (Signed)
Rehab admissions - I have called and faxed information to [facility] insurance carrier requesting acute inpatient rehab admission.  I will follow up as soon as I hear from insurance case manager.  Call me for questions.  #[phone]

## 2012-09-14 NOTE — Progress Notes (Addendum)
Nutrition Follow-up  Intervention:    Resource Breeze 3 times daily between meals (250 kcals, 9 gm protein per 8 fl oz carton) RD to follow for nutrition care plan  Assessment:   Patient extubated 10/28. NGT discontinued this AM. + flatus and BM's. Reports his appetite is poor. He does like juices and is amenable to trying [employer] supplement -- RD to order.  Diet Order:  Full Liquids  Meds: Scheduled Meds:   . albuterol  2.5 mg Nebulization Q6H  . antiseptic oral rinse  15 mL Mouth Rinse QID  . chlorhexidine  15 mL Mouth Rinse BID  . furosemide  20 mg Intravenous Once  . furosemide  20 mg Intravenous Once  . ipratropium  0.5 mg Nebulization Q6H  . metoprolol tartrate  25 mg Oral BID  . pantoprazole (PROTONIX) IV  40 mg Intravenous QHS  . potassium chloride  10 mEq Intravenous Q1 Hr x 4  . potassium chloride  40 mEq Oral Once  . potassium chloride  40 mEq Oral Once  . DISCONTD: metoprolol  5 mg Intravenous Q6H   Continuous Infusions:   . sodium chloride 20 mL/hr at 09/13/12 1310   PRN Meds:.acetaminophen (TYLENOL) oral liquid 160 mg/5 mL, albuterol, fentaNYL, hydrALAZINE, labetalol, magnesium sulfate 1 - 4 g bolus IVPB, metoprolol, ondansetron, phenol, potassium chloride   CMP     Component Value Date/Time   NA 142 09/14/2012 0530   K 3.4* 09/14/2012 0530   CL 106 09/14/2012 0530   CO2 29 09/14/2012 0530   GLUCOSE 122* 09/14/2012 0530   BUN 27* 09/14/2012 0530   CREATININE 0.64 09/14/2012 0530   CALCIUM 8.1* 09/14/2012 0530   PROT 5.5* 09/14/2012 0530   ALBUMIN 2.3* 09/14/2012 0530   AST 36 09/14/2012 0530   ALT 27 09/14/2012 0530   ALKPHOS 60 09/14/2012 0530   BILITOT 1.4* 09/14/2012 0530   GFRNONAA >90 09/14/2012 0530   GFRAA >90 09/14/2012 0530    CBG (last 3)   Basename 09/14/12 0354 09/14/12 0021 09/13/12 1917  GLUCAP 110* 113* 134*     Intake/Output Summary (Last 24 hours) at 09/14/12 1102 Last data filed at 09/14/12 0900  Gross per 24 hour    Intake   1287 ml  Output   2431 ml  Net  -1144 ml    Weight Status:  96.9 kg (10/31) -- trending down  Re-estimated needs:  2100-2300 kcals, 110-120 gm  Nutrition Dx:  Inadequate Oral Intake r/t poor appetite as evidenced by patient report, ongoing  New Goal:  Oral intake with meals & supplements to meet >/= 90% of estimated nutrition needs, unmet  Monitor:  PO & supplemental intake, weight, labs, I/O's  Kirkland Hun, RD, LDN Pager #: [phone] After-Hours Pager #: [phone]

## 2012-09-14 NOTE — Progress Notes (Signed)
Subjective: Interval History: none.. looks great. Sitting up in chair. Comfortable. No respiratory distress.  Objective: Vital signs in last 24 hours: Temp:  [98.1 F (36.7 C)-99.6 F (37.6 C)] 98.1 F (36.7 C) (10/31 0400) Pulse Rate:  [80-99] 95  (10/31 0700) Resp:  [21-35] 28  (10/31 0700) BP: (125-165)/(66-87) 165/69 mmHg (10/31 0700) SpO2:  [86 %-100 %] 87 % (10/31 0700) FiO2 (%):  [35 %] 35 % (10/30 1100) Weight:  [213 lb 12.8 oz (96.979 kg)] 213 lb 12.8 oz (96.979 kg) (10/31 0700)  Intake/Output from previous day: 10/30 0701 - 10/31 0700 In: 1877 [P.O.:1320; I.V.:345; IV Piggyback:212] Out: 2601 [Urine:2600; Stool:1] Intake/Output this shift:    abdomen soft, wound healing without difficulty. He did well perfused.  Lab Results:  Basename 09/13/12 0255 09/12/12 0410  WBC 11.8* 13.7*  HGB 9.0* 9.1*  HCT 27.5* 27.9*  PLT 137* 105*   BMET  Basename 09/14/12 0530 09/13/12 1318 09/13/12 0255  NA 142 -- 142  K 3.4* 3.5 --  CL 106 -- 104  CO2 29 -- 29  GLUCOSE 122* -- 96  BUN 27* -- 33*  CREATININE 0.64 -- 0.69  CALCIUM 8.1* -- 8.1*    Studies/Results: Ct Abdomen Pelvis Wo Contrast  09/08/2012  *RADIOLOGY REPORT*  Clinical Data: Abdominal pain  CT ABDOMEN AND PELVIS WITHOUT CONTRAST  Technique:  Multidetector CT imaging of the abdomen and pelvis was performed following the standard protocol without intravenous contrast.  Comparison: None.  Findings: There is subsegmental atelectasis noted in both lung bases.  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 Evidence of diffuse retroperitoneal hemorrhage is identified which is uplifting and displacing the pancreas, duodenum and left kidney. Hemorrhage extends into bilateral perinephric space is normal along the ventral surface of the left iliopsoas muscle into the superior portion of the pelvis.  There is a small amount of hemoperitoneum is noted within the dependent portion of the pelvis.  No focal  liver abnormality.  Gallbladder appears normal.  No biliary dilatation.  Normal appearance of the pancreas.  The spleen is negative.  The adrenal glands are both normal.   The urinary bladder appears normal.  There is a low density cyst within the upper pole the right kidney measuring 1.2 cm. Incompletely characterized without IV contrast.  No focal left kidney abnormality.  The patient has a hiatal.  There is a small hiatal hernia.  The small bowel loops have a normal caliber.  The appendix is visualized and appears normal.  There is collapse of the proximal colon.  The mid and distal colon appear normal.  Review of the visualized osseous structures is unremarkable.  The patient has a small umbilical hernia which contains fat only. Bilateral fat containing inguinal hernias are also noted.  IMPRESSION:  1.  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  There is acute hemorrhage throughout the retroperitoneum which is displacing the small bowel loops and left kidney. 2.  Small amount of hemoperitoneum is noted within the dependent portion of the pelvis.  Critical Value/emergent results were called by telephone at the time of interpretation on 09/08/2012 at 7:42 p.m. to Dr. Kellie Simmering, who verbally acknowledged these results.   Original Report Authenticated By: Angelita Ingles, M.D.    Dg Chest Port 1 View  09/13/2012  *RADIOLOGY REPORT*  Clinical Data: Pleural effusions, weakness.  PORTABLE CHEST - 1 VIEW  Comparison: 09/11/2012.  Findings: Endotracheal tube and nasogastric tube have been removed. Right IJ catheter sheath tip  projects in the region of the brachiocephalic vein junction.  Heart size stable. Diffuse bilateral air space disease persists.  Bibasilar collapse/consolidation, worsening on the right.  Bilateral pleural effusions.  IMPRESSION: Congestive heart failure with bilateral lower lobe collapse/consolidation, worsening on the right.   Original Report Authenticated By: Luretha Rued, M.D.     Dg Chest Port 1 View  09/11/2012  *RADIOLOGY REPORT*  Clinical Data: Follow-up atelectasis.  Ventilator.  PORTABLE CHEST - 1 VIEW  Comparison: 09/10/2012.  Findings: Endotracheal tube is in satisfactory position. Nasogastric tube is followed into the stomach.  Right IJ catheter sheath remains in place.  Heart size stable.  There is moderate diffuse bilateral air space disease, bibasilar dependent.  Streaky densities are seen at the right lung base.  Left lower lobe collapse/consolidation.  Left pleural effusion.  IMPRESSION:  1.  Probable combination of pulmonary edema, left lower lobe collapse/consolidation and right basilar atelectasis. 2.  Small left pleural effusion.   Original Report Authenticated By: Luretha Rued, M.D.    Dg Chest Port 1 View  09/10/2012  *RADIOLOGY REPORT*  Clinical Data: Fever.  PORTABLE CHEST - 1 VIEW  Comparison: 09/09/2012.  Findings: Endotracheal tube tip 6.8 cm above the carina.  Right central line tip just proximal to the proximal superior vena cava level.  Right lung apex not entirely excluded on present exam.  No gross pneumothorax.  Nasogastric tube tip gastric antrum level.  Consolidation lung bases may represent combination of atelectasis, infiltrate and / or pleural effusions.  Underlying mass not excluded.  Pulmonary vascular congestion most notable centrally.  Cardiomegaly.  Tortuous aorta.  IMPRESSION: When compared to the prior examination, decreased aeration left lung base raising possibility of interval developing left-sided pleural effusion with left base atelectasis and / or infiltrate.  Persistent consolidation right lung base may be partially explained by elevated right hemidiaphragm although a right-sided pleural effusion, atelectasis or infiltrate is suspected.  Pulmonary vascular prominence most notable centrally.   Original Report Authenticated By: Doug Sou, M.D.    Dg Chest Portable 1 View  09/09/2012  *RADIOLOGY REPORT*  Clinical Data:  Postop.  PORTABLE CHEST - 1 VIEW  Comparison: 09/09/2012 at 0058 hours.  Findings: Endotracheal tube remains in place with tip about 5.4 cm above the carina.  Enteric tube is in place.  The tip is not visualized below the field of view but is below the left hemidiaphragm consistent with location at least in the stomach. Right central venous catheter tip is in the region of the thoracic inlet, likely in the lower jugular or upper SVC region.  No pneumothorax.  Shallow inspiration with elevation of right hemidiaphragm.  Mild cardiac enlargement with prominence of central pulmonary vascularity.  No significant change since previous study.  IMPRESSION: Stable appearance of the chest and appliances since previous study.   Original Report Authenticated By: Neale Burly, M.D.    Dg Chest Portable 1 View  09/09/2012  *RADIOLOGY REPORT*  Clinical Data: Line placement  PORTABLE CHEST - 1 VIEW  Comparison: 09/08/2012  Findings: Endotracheal tube terminates 5 cm above the carina.  Right IJ venous sheath terminates in the right brachiocephalic vein.  No pneumothorax.  Low lung volumes.  Right basilar opacity, likely atelectasis.  Enteric tube courses below the diaphragm.  IMPRESSION: Endotracheal tube terminates 5 cm above the carina.  Right IJ venous sheath terminates in the right brachiocephalic vein.  No pneumothorax.   Original Report Authenticated By: Julian Hy, M.D.  Dg Chest Portable 1 View  09/08/2012  *RADIOLOGY REPORT*  Clinical Data: Preop AAA  PORTABLE CHEST - 1 VIEW  Comparison: None.  Findings: Lungs are essentially clear. No pleural effusion or pneumothorax.  Cardiomediastinal silhouette is within normal limits.  IMPRESSION: No evidence of acute cardiopulmonary disease.   Original Report Authenticated By: Julian Hy, M.D.    Dg Abd Portable 1v  09/08/2012  *RADIOLOGY REPORT*  Clinical Data: AAA repair, postop count film  PORTABLE ABDOMEN - 1 VIEW  Comparison: CT abdomen pelvis  dated 09/08/2012 at 1911 hours.  Findings: No radiopaque foreign body is seen.  Midline skin staples.  IMPRESSION: No radiopaque foreign body is seen.   Original Report Authenticated By: Julian Hy, M.D.    Anti-infectives: Anti-infectives     Start     Dose/Rate Route Frequency Ordered Stop   09/09/12 0300   cefUROXime (ZINACEF) 1.5 g in dextrose 5 % 50 mL IVPB        1.5 g 100 mL/hr over 30 Minutes Intravenous Every 12 hours 09/09/12 0030 09/09/12 1619          Assessment/Plan: s/p Procedure(s) (LRB) with comments: ANEURYSM ABDOMINAL AORTIC REPAIR (N/A) - open repair ruptured abdomenal aortic aneurysm Stable postop day 6 for ruptured abdominal aortic aneurysm. We'll transfer to unit 2000. Continue to mobilize   LOS: 6 days   Kolsen Choe 09/14/2012, 7:49 AM

## 2012-09-15 ENCOUNTER — Encounter (HOSPITAL_COMMUNITY): Payer: Self-pay | Admitting: *Deleted

## 2012-09-15 ENCOUNTER — Inpatient Hospital Stay (HOSPITAL_COMMUNITY)
Admission: RE | Admit: 2012-09-15 | Discharge: 2012-09-22 | DRG: 945 | Disposition: A | Discharge status: Home Health | Payer: Medicare Other | Source: Ambulatory Visit | Attending: Physical Medicine & Rehabilitation | Admitting: Physical Medicine & Rehabilitation

## 2012-09-15 ENCOUNTER — Telehealth: Payer: Self-pay | Admitting: Vascular Surgery

## 2012-09-15 DIAGNOSIS — D62 Acute posthemorrhagic anemia: Secondary | ICD-10-CM

## 2012-09-15 DIAGNOSIS — K560 Paralytic ileus: Secondary | ICD-10-CM

## 2012-09-15 DIAGNOSIS — R5381 Other malaise: Secondary | ICD-10-CM

## 2012-09-15 DIAGNOSIS — Z8673 Personal history of transient ischemic attack (TIA), and cerebral infarction without residual deficits: Secondary | ICD-10-CM

## 2012-09-15 DIAGNOSIS — J449 Chronic obstructive pulmonary disease, unspecified: Secondary | ICD-10-CM

## 2012-09-15 DIAGNOSIS — J4489 Other specified chronic obstructive pulmonary disease: Secondary | ICD-10-CM

## 2012-09-15 DIAGNOSIS — Z9889 Other specified postprocedural states: Secondary | ICD-10-CM

## 2012-09-15 DIAGNOSIS — R918 Other nonspecific abnormal finding of lung field: Secondary | ICD-10-CM

## 2012-09-15 DIAGNOSIS — F17200 Nicotine dependence, unspecified, uncomplicated: Secondary | ICD-10-CM

## 2012-09-15 DIAGNOSIS — I6529 Occlusion and stenosis of unspecified carotid artery: Secondary | ICD-10-CM

## 2012-09-15 DIAGNOSIS — R0902 Hypoxemia: Secondary | ICD-10-CM

## 2012-09-15 DIAGNOSIS — Z5189 Encounter for other specified aftercare: Principal | ICD-10-CM

## 2012-09-15 DIAGNOSIS — E876 Hypokalemia: Secondary | ICD-10-CM

## 2012-09-15 DIAGNOSIS — D72829 Elevated white blood cell count, unspecified: Secondary | ICD-10-CM

## 2012-09-15 DIAGNOSIS — I713 Abdominal aortic aneurysm, ruptured, unspecified: Secondary | ICD-10-CM | POA: Diagnosis present

## 2012-09-15 DIAGNOSIS — J9601 Acute respiratory failure with hypoxia: Secondary | ICD-10-CM

## 2012-09-15 DIAGNOSIS — G5790 Unspecified mononeuropathy of unspecified lower limb: Secondary | ICD-10-CM

## 2012-09-15 DIAGNOSIS — J90 Pleural effusion, not elsewhere classified: Secondary | ICD-10-CM

## 2012-09-15 HISTORY — DX: Shortness of breath: R06.02

## 2012-09-15 HISTORY — DX: Peripheral vascular disease, unspecified: I73.9

## 2012-09-15 HISTORY — DX: Chronic obstructive pulmonary disease, unspecified: J44.9

## 2012-09-15 MED ORDER — POLYETHYLENE GLYCOL 3350 17 G PO PACK
17.0000 g | PACK | Freq: Every day | ORAL | Status: DC | PRN
  Filled 2012-09-15: qty 1

## 2012-09-15 MED ORDER — BIOTENE DRY MOUTH MT LIQD
15.0000 mL | Freq: Four times a day (QID) | OROMUCOSAL | Status: DC
  Administered 2012-09-16 – 2012-09-22 (×15): 15 mL via OROMUCOSAL

## 2012-09-15 MED ORDER — METOPROLOL TARTRATE 25 MG PO TABS
25.0000 mg | ORAL_TABLET | Freq: Two times a day (BID) | ORAL | Status: DC
  Administered 2012-09-15 – 2012-09-22 (×14): 25 mg via ORAL
  Filled 2012-09-15 (×18): qty 1

## 2012-09-15 MED ORDER — OXYCODONE HCL 5 MG PO TABS: 5.0000 mg | ORAL_TABLET | Freq: Four times a day (QID) | ORAL | Status: DC | PRN

## 2012-09-15 MED ORDER — ACETAMINOPHEN 325 MG PO TABS
325.0000 mg | ORAL_TABLET | ORAL | Status: DC | PRN
  Administered 2012-09-16 – 2012-09-19 (×3): 650 mg via ORAL
  Filled 2012-09-15 (×3): qty 2

## 2012-09-15 MED ORDER — ALBUTEROL SULFATE (5 MG/ML) 0.5% IN NEBU
2.5000 mg | INHALATION_SOLUTION | Freq: Four times a day (QID) | RESPIRATORY_TRACT | Status: DC
  Administered 2012-09-15 – 2012-09-21 (×21): 2.5 mg via RESPIRATORY_TRACT
  Filled 2012-09-15 (×23): qty 0.5

## 2012-09-15 MED ORDER — IPRATROPIUM BROMIDE 0.02 % IN SOLN
0.5000 mg | Freq: Four times a day (QID) | RESPIRATORY_TRACT | Status: DC
  Administered 2012-09-15 – 2012-09-21 (×20): 0.5 mg via RESPIRATORY_TRACT
  Filled 2012-09-15 (×23): qty 2.5

## 2012-09-15 MED ORDER — PHENOL 1.4 % MT LIQD
1.0000 | OROMUCOSAL | Status: DC | PRN
  Filled 2012-09-15: qty 177

## 2012-09-15 MED ORDER — PROCHLORPERAZINE MALEATE 5 MG PO TABS
5.0000 mg | ORAL_TABLET | Freq: Four times a day (QID) | ORAL | Status: DC | PRN
  Filled 2012-09-15: qty 2

## 2012-09-15 MED ORDER — BOOST / RESOURCE BREEZE PO LIQD
1.0000 | Freq: Three times a day (TID) | ORAL | Status: DC
  Administered 2012-09-16 – 2012-09-17 (×5): 1 via ORAL

## 2012-09-15 MED ORDER — ALUM & MAG HYDROXIDE-SIMETH 200-200-20 MG/5ML PO SUSP
30.0000 mL | ORAL | Status: DC | PRN
  Administered 2012-09-18 – 2012-09-21 (×4): 30 mL via ORAL
  Filled 2012-09-15 (×4): qty 30

## 2012-09-15 MED ORDER — PROCHLORPERAZINE EDISYLATE 5 MG/ML IJ SOLN
5.0000 mg | Freq: Four times a day (QID) | INTRAMUSCULAR | Status: DC | PRN
  Filled 2012-09-15: qty 2

## 2012-09-15 MED ORDER — GUAIFENESIN-DM 100-10 MG/5ML PO SYRP: 5.0000 mL | ORAL_SOLUTION | Freq: Four times a day (QID) | ORAL | Status: DC | PRN

## 2012-09-15 MED ORDER — TRAZODONE HCL 50 MG PO TABS
25.0000 mg | ORAL_TABLET | Freq: Every evening | ORAL | Status: DC | PRN
  Administered 2012-09-18 – 2012-09-20 (×3): 50 mg via ORAL
  Filled 2012-09-15 (×3): qty 1

## 2012-09-15 MED ORDER — BISACODYL 10 MG RE SUPP: 10.0000 mg | Freq: Every day | RECTAL | Status: DC | PRN

## 2012-09-15 MED ORDER — FLEET ENEMA 7-19 GM/118ML RE ENEM
1.0000 | ENEMA | Freq: Once | RECTAL | Status: AC | PRN
  Filled 2012-09-15: qty 1

## 2012-09-15 MED ORDER — TRAMADOL HCL 50 MG PO TABS
50.0000 mg | ORAL_TABLET | Freq: Four times a day (QID) | ORAL | Status: DC | PRN
  Administered 2012-09-18 – 2012-09-21 (×2): 50 mg via ORAL
  Filled 2012-09-15 (×2): qty 1

## 2012-09-15 MED ORDER — PROCHLORPERAZINE 25 MG RE SUPP
12.5000 mg | Freq: Four times a day (QID) | RECTAL | Status: DC | PRN
  Filled 2012-09-15: qty 1

## 2012-09-15 MED ORDER — ALBUTEROL SULFATE (5 MG/ML) 0.5% IN NEBU: 2.5000 mg | INHALATION_SOLUTION | Freq: Four times a day (QID) | RESPIRATORY_TRACT | Status: DC

## 2012-09-15 MED ORDER — ALBUTEROL SULFATE (5 MG/ML) 0.5% IN NEBU: 2.5000 mg | INHALATION_SOLUTION | RESPIRATORY_TRACT | Status: DC | PRN

## 2012-09-15 MED ORDER — METOPROLOL TARTRATE 25 MG PO TABS: 25.0000 mg | ORAL_TABLET | Freq: Two times a day (BID) | ORAL | Status: DC

## 2012-09-15 NOTE — Progress Notes (Signed)
Physical Therapy Treatment Patient Details Name: Brandon [name] MRN: [id number] DOB: Dec 07, 1940 Today's Date: 09/15/2012 Time: [id number] PT Time Calculation (min): 27 min  PT Assessment / Plan / Recommendation Comments on Treatment Session  Pt s/p emergent repair of ruptured AAA.  Pt continues to make steady progress but does continue to desat with activity even with 4L O2.    Follow Up Recommendations  Post acute inpatient     Does the patient have the potential to tolerate intense rehabilitation  Yes, Recommend IP Rehab Screening  Barriers to Discharge        Equipment Recommendations  3 in 1 bedside comode    Recommendations for Other Services    Frequency Min 3X/week   Plan Discharge plan remains appropriate;Frequency remains appropriate    Precautions / Restrictions Precautions Precautions: Fall   Pertinent Vitals/Pain SaO2 88% on 4L with amb.    Mobility  Transfers Sit to Stand: 4: Min assist;With upper extremity assist;With armrests;From chair/3-in-1 Stand to Sit: 4: Min assist;With upper extremity assist;To chair/3-in-1 Details for Transfer Assistance: verbal cues for hand placement and assist to bring hips up. Ambulation/Gait Ambulation/Gait Assistance: 4: Min assist Ambulation Distance (Feet): 100 Feet (100' x 2) Assistive device: Rolling walker Ambulation/Gait Assistance Details: Verbal cues to stand more erect.  Hyperextension of bil knees but no buckling. Gait Pattern: Step-through pattern;Decreased stride length;Right genu recurvatum;Left genu recurvatum Gait velocity: decr    Exercises     PT Diagnosis:    PT Problem List:   PT Treatment Interventions:     PT Goals Acute Rehab PT Goals PT Goal: Sit to Stand - Progress: Progressing toward goal PT Goal: Stand to Sit - Progress: Progressing toward goal PT Goal: Ambulate - Progress: Progressing toward goal  Visit Information  Last PT Received On: 09/15/12 Assistance Needed: +1    Subjective  Data  Subjective: I slept better last night.   Cognition  Overall Cognitive Status: Appears within functional limits for tasks assessed/performed Arousal/Alertness: Awake/alert Orientation Level: Appears intact for tasks assessed Behavior During Session: [facility] for tasks performed    Balance  Static Standing Balance Static Standing - Balance Support: Bilateral upper extremity supported (on walker) Static Standing - Level of Assistance: 5: Stand by assistance  End of Session PT - End of Session Equipment Utilized During Treatment: Gait belt Activity Tolerance: Patient tolerated treatment well Patient left: in chair   GP     [place] 09/15/2012, 12:20 PM  [employer] PT [phone]

## 2012-09-15 NOTE — Telephone Encounter (Addendum)
Message copied by Rosalyn Charters on Fri Sep 15, 2012  2:18 PM ------      Message from: [place], [place] K      Created: Fri Sep 15, 2012  1:56 PM      Regarding: schedule                   ----- Message -----         From: Dara Lords, PA         Sent: 09/15/2012   1:26 PM           To: Sharee Pimple, CMA            S/p AAA by lawson.      F/u in 2 weeks.      Will need staples removed.            Thanks,      Lelon Mast  notified patient's wife of fu appt. on 10-05-12 at 9:30

## 2012-09-15 NOTE — Progress Notes (Signed)
Rehab admissions - I have approval from [facility] to admit patient to inpatient rehab today.  I talked with patient and his brother.  Both are agreeable to inpatient rehab admit.  Bed available and can admit today.  Call me for questions.  #[phone]

## 2012-09-15 NOTE — Plan of Care (Signed)
Overall Plan of Care [facility]) Patient Details Name: Brandon [name] MRN: [id number] DOB: 16-Mar-1941  Diagnosis:  Deconditioning after AAA repair  Primary Diagnosis:    <principal problem not specified> Co-morbidities: ileus, wound care, pain, anemia  Functional Problem List  Patient demonstrates impairments in the following areas: Balance and Endurance  Basic ADL's: bathing, dressing and toileting Advanced ADL's: simple meal preparation  Transfers:  bed mobility, bed to chair, toilet, tub/shower and car Locomotion:  ambulation  Additional Impairments:  None  Anticipated Outcomes Item Anticipated Outcome  Eating/Swallowing  N/a - pt is independent  Basic self-care  Mod I  Toileting  Mod I  Bowel/Bladder  na  Transfers  Mod-I  Locomotion  Mod-I using LRAD x 150'   Communication    Cognition    Pain  na  Safety/Judgment    Other     Therapy Plan: PT Frequency: 1-2 X/day, 60-90 minutes OT Frequency: 1-2 X/day, 60-90 minutes;5 out of 7 days     Team Interventions: Item RN PT OT SLP SW TR Other  Self Care/Advanced ADL Retraining   x      Neuromuscular Re-Education         Therapeutic Activities  x x      UE/LE Strength Training/ROM  x x      UE/LE Coordination Activities         Visual/Perceptual Remediation/Compensation         DME/Adaptive Equipment Instruction  x x      Therapeutic Exercise  x x      Balance/Vestibular Training  x x      Patient/Family Education x x x      Cognitive Remediation/Compensation         Functional Mobility Training  x x      Ambulation/Gait Training  x       Brewing technologist         Bladder Management         Bowel Management         Disease Management/Prevention x        Pain Management         Medication Management x         Skin Care/Wound Management x        Splinting/Orthotics         Discharge Planning   x      Psychosocial Support                            Team Discharge Planning: Destination:  Home Projected Follow-up:  PT, OT and Home Health Projected Equipment Needs:  Cane Patient/family involved in discharge planning:  Yes  MD ELOS: 10-14 days Medical Rehab Prognosis:  Excellent Assessment: Pt admitted for cir therapies. The team will be addressing stamina, strength, pain, fxnl mobility, ADL's, nutrition, wound care. Goals are set at mod I.

## 2012-09-15 NOTE — H&P (Signed)
Physical Medicine and Rehabilitation Admission H&P  Chief Complaint   Patient presents with   .  Deconditioning due to ruptured aneurysm.   :  HPI: Brandon [name] is a 71 y.o. 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 He was taken to OR emergently for resection and grafting win insertion of aorto by common iliac graft by Dr. Kellie Simmering on 09/09/12. Required 10 units PRBC, 6 units FFP and 2 units of platelets intraoperatively. Intubated through 10/28. Has required diuresis for fluid overload. Post op ileus resolving and diet slowly being advanced. Therapies ongoing and CIR recommended by therapy team.    History reviewed. No pertinent past medical history.  Past Surgical History   Procedure  Date   .  Abdominal aortic aneurysm repair  09/08/2012     Procedure: ANEURYSM ABDOMINAL AORTIC REPAIR; Surgeon: [name], MD; Location: [facility]; Service: Vascular; Laterality: N/A; open repair ruptured abdomenal aortic aneurysm   .  Ganglion cyst excision      Right wrist    Family History   Problem  Relation  Age of Onset   .  Dementia  Mother    .  Cancer  Father     Social History: Married. Self employed land scaper. Independent and active PTA. He reports that he has been smoking --1PPD for ~53 years. He does not have any smokeless tobacco history on file. He reports that he does not drink alcohol or use illicit drugs.   Allergies: No Known Allergies  Scheduled Meds:  .  albuterol  2.5 mg  Nebulization  Q6H   .  antiseptic oral rinse  15 mL  Mouth Rinse  QID   .  chlorhexidine  15 mL  Mouth Rinse  BID   .  ipratropium  0.5 mg  Nebulization  Q6H   .  metoprolol tartrate  25 mg  Oral  BID    Medications Prior to Admission   Medication  Sig  Dispense  Refill   .  ibuprofen (ADVIL,MOTRIN) 200 MG tablet  Take 400 mg by mouth every morning.      Home:  Home Living  Lives With: Spouse  Available  Help at Discharge: Available PRN/intermittently (spouse works full time)  Type of Home: House  Home Access: Stairs to enter  Technical brewer of Steps: 1  Entrance Stairs-Rails: Can reach both  Home Layout: One level  Bathroom Shower/Tub: Tourist information centre manager: Standard  Bathroom Accessibility: Yes  How Accessible: Accessible via [place]: Walker - rolling;Built-in shower seat  Functional History:  Prior Function  Able to Take Stairs?: Yes  Driving: Yes  Vocation: Full time employment  Comments: works in Theatre stage manager Status:  Mobility:  Bed Mobility  Bed Mobility: Sit to Sidelying Left  Supine to Sit: 1: +2 Total assist  Supine to Sit: Patient Percentage: 40%  Sitting - Scoot to Edge of Bed: 1: +2 Total assist  Sitting - Scoot to Edge of Bed: Patient Percentage: 50%  Sit to Sidelying Left: 4: Min assist;Other (comment);HOB flat (for LEs)  Sit to Sidelying Left: Patient Percentage: 70%  Transfers  Transfers: Sit to Stand;Stand to Sit;Stand Pivot Transfers  Sit to Stand: 4: Min assist;With upper extremity assist;With armrests;From chair/3-in-1  Sit to Stand: Patient Percentage: 70%  Stand to Sit: 4: Min assist;With upper extremity assist;To chair/3-in-1  Stand to Sit: Patient Percentage:  70%  Stand Pivot Transfers: 1: +2 Total assist  Stand Pivot Transfers: Patient Percentage: 50%  Ambulation/Gait  Ambulation/Gait Assistance: 4: Min assist  Ambulation/Gait: Patient Percentage: 70%  Ambulation Distance (Feet): 100 Feet (100' x 2)  Assistive device: Rolling walker  Ambulation/Gait Assistance Details: Verbal cues to stand more erect. Hyperextension of bil knees but no buckling.  Gait Pattern: Step-through pattern;Decreased stride length;Right genu recurvatum;Left genu recurvatum  Gait velocity: decr   ADL:  ADL  Eating/Feeding: Set up  Where Assessed - Eating/Feeding: Chair  Grooming: Performed;Wash/dry face;Brushing  hair;Shaving;Minimal assistance  Where Assessed - Grooming: Supported sitting  Upper Body Bathing: Moderate assistance  Where Assessed - Upper Body Bathing: Supported sitting  Lower Body Bathing: Maximal assistance  Where Assessed - Lower Body Bathing: Supported sit to stand  Upper Body Dressing: Maximal assistance  Where Assessed - Upper Body Dressing: Unsupported sitting  Lower Body Dressing: +1 Total assistance  Where Assessed - Lower Body Dressing: Unsupported sit to stand  Toilet Transfer: +2 Total assistance  Toilet Transfer Method: Sit to stand;Stand pivot  Toilet Transfer Equipment: Other (comment) (recliner)  Equipment Used: Gait belt;Rolling walker  Transfers/Ambulation Related to ADLs: ambulated with min assist and RW, second person for safety, equipment and to follow with chair. Pt on 6L 02 with sats remaining in mid 55s.  ADL Comments: Pt already up in chair. Very willing to work with OT on grooming tasks in sitting. 02 at 92% on 6L in sitting, upon removal of 02 for shaving upper lip, decreased to mid 80s. Instructed pt in pacing and purse lip breathing during exertion.  Cognition:  Cognition  Arousal/Alertness: Awake/alert  Orientation Level: Oriented X4  Cognition  Overall Cognitive Status: Appears within functional limits for tasks assessed/performed  Difficult to assess due to: Other (comment) (pt with soft voice and on nonrebreather)  Arousal/Alertness: Awake/alert  Orientation Level: Appears intact for tasks assessed  Behavior During Session: [facility] for tasks performed  Cognition - Other Comments: Appears intact, but difficult to accurately asses due to non re-breather  Blood pressure 126/80, pulse 96, temperature 99.3 F (37.4 C), temperature source Oral, resp. rate 20, height 6' (1.829 m), weight 96.979 kg (213 lb 12.8 oz), SpO2 91.00%.  '@PHYSEXAMBYAGE2'$ @  Results for orders placed during the hospital encounter of 09/08/12 (from the past 48 hour(s))   GLUCOSE,  CAPILLARY Status: Abnormal    Collection Time    09/13/12 3:27 PM   Component  Value  Range  Comment    Glucose-Capillary  119 (*)  70 - 99 mg/dL    GLUCOSE, CAPILLARY Status: Abnormal    Collection Time    09/13/12 7:17 PM   Component  Value  Range  Comment    Glucose-Capillary  134 (*)  70 - 99 mg/dL     Comment 1  Notify RN      Comment 2  Documented in Chart     GLUCOSE, CAPILLARY Status: Abnormal    Collection Time    09/14/12 12:21 AM   Component  Value  Range  Comment    Glucose-Capillary  113 (*)  70 - 99 mg/dL     Comment 1  Notify RN      Comment 2  Documented in Chart     GLUCOSE, CAPILLARY Status: Abnormal    Collection Time    09/14/12 3:54 AM   Component  Value  Range  Comment    Glucose-Capillary  110 (*)  70 - 99 mg/dL  Comment 1  Notify RN      Comment 2  Documented in Chart     COMPREHENSIVE METABOLIC PANEL Status: Abnormal    Collection Time    09/14/12 5:30 AM   Component  Value  Range  Comment    Sodium  142  135 - 145 mEq/L     Potassium  3.4 (*)  3.5 - 5.1 mEq/L     Chloride  106  96 - 112 mEq/L     CO2  29  19 - 32 mEq/L     Glucose, Bld  122 (*)  70 - 99 mg/dL     BUN  27 (*)  6 - 23 mg/dL     Creatinine, Ser  0.64  0.50 - 1.35 mg/dL     Calcium  8.1 (*)  8.4 - 10.5 mg/dL     Total Protein  5.5 (*)  6.0 - 8.3 g/dL     Albumin  2.3 (*)  3.5 - 5.2 g/dL     AST  36  0 - 37 U/L     ALT  27  0 - 53 U/L     Alkaline Phosphatase  60  39 - 117 U/L     Total Bilirubin  1.4 (*)  0.3 - 1.2 mg/dL     GFR calc non Af Amer  >90  >90 mL/min     GFR calc Af Amer  >90  >90 mL/min     Dg Chest Port 1 View  09/14/2012 *RADIOLOGY REPORT* Clinical Data: Respiratory failure, follow-up PORTABLE CHEST - 1 VIEW Comparison: Portable chest x-ray of 09/13/2012 Findings: The lungs appear slightly better aerated. Airspace disease has diminished somewhat. There are still bilateral effusions with basilar atelectasis, left greater than right and cardiomegaly is stable.  Minimal pulmonary vascular congestion remains. The venous sheath has been removed from the SVC and no pneumothorax is noted. IMPRESSION: Improved aeration. Persistent basilar opacities consistent with atelectasis and effusions. Minimal pulmonary vascular congestion remains. Original Report Authenticated By: Ivar Drape, M.D.   Post Admission Physician Evaluation:  1. Functional deficits secondary to deconditioning after AAA repair and multiple medical. 2. Patient is admitted to receive collaborative, interdisciplinary care between the physiatrist, rehab nursing staff, and therapy team. 3. Patient's level of medical complexity and substantial therapy needs in context of that medical necessity cannot be provided at a lesser intensity of care such as a SNF. 4. Patient has experienced substantial functional loss from his/her baseline which was documented above under the "Functional History" and "Functional Status" headings. Judging by the patient's diagnosis, physical exam, and functional history, the patient has potential for functional progress which will result in measurable gains while on inpatient rehab. These gains will be of substantial and practical use upon discharge in facilitating mobility and self-care at the household level. 5. Physiatrist will provide 24 hour management of medical needs as well as oversight of the therapy plan/treatment and provide guidance as appropriate regarding the interaction of the two. 6. 24 hour rehab nursing will assist with bladder management, bowel management, safety, skin/wound care, disease management, medication administration, pain management and patient education and help integrate therapy concepts, techniques,education, etc. 7. PT will assess and treat for: fxnl mobility, strength, stamina, safety, adaptive equipment. Goals are: mod I. 8. OT will assess and treat for: upper ext strength, ADL's, fxnl mobility, adaptive equipment and techniques, safety. Goals are:  mod I to min assist. 9. SLP will assess and treat for: n/a. Goals are:  n/a. 10. Case Management and Social Worker will assess and treat for psychological issues and discharge planning. 11. Team conference will be held weekly to assess progress toward goals and to determine barriers to discharge. 12. Patient will receive at least 3 hours of therapy per day at least 5 days per week. 13. ELOS: 7-10 days Prognosis: excellent Medical Problem List and Plan:  1. DVT Prophylaxis/Anticoagulation: Mechanical: Sequential compression devices, below knee Bilateral lower extremities  2. Pain Management: tylenol or tramadol for breakthrough pain. No significant pain on exam today  3. Mood: team to provide ego support as necessary. Pt is fairly positive at this point.  4. Neuropsych: This patient is capable of making decisions on his/her own behalf.  5. ABLA: check follow up labs 11/04. May add iron supplement if GI symptoms improve.  6. Hypokalemia: Will check follow up labs 11/04. Likely dilutional and due to ABLA.  7. Leucocytosis: Likely reactive. Monitor for fever. Monitor wound.  8. Post op ileus: continues on full liquid diet. He is having regular, semi-formed bm's. Abdomen still distended  9. Hypoxia: Pulmonary toilet with IS. Continue nebs for now. Despite appearance pt states his breathing is steadily improving.  09/15/2012, Oval Linsey, MD

## 2012-09-15 NOTE — Progress Notes (Signed)
Patient arrived on unit in wheelchair at 1814 oriented to room watched safety video with wife see  . Discussed  What he wants from rehab.

## 2012-09-15 NOTE — Progress Notes (Addendum)
[facility] Progress Note  09/15/2012 7:50 AM POD 7  Subjective:  States his belly feels a little bloated this am.  Getting to the [facility] for BM.  +BM yesterday.  Tm 99.6 now 99.3 HR 80's-90's reg Q000111Q systolic (0000000 past 12 hrs) 95%5LO2NC  Filed Vitals:   09/15/12 0620  BP: 134/77  Pulse: 95  Temp: 99.3 F (37.4 C)  Resp: 20    Physical Exam: Cardiac:  Regular Lungs:  Some rhonchi at bases otherwise clear Incisions:  C/d/i with staples in tact Abdomen:  Slightly distended; +BS  CBC    Component Value Date/Time   WBC 11.8* 09/13/2012 0255   WBC 16.5* 09/08/2012 1729   RBC 2.99* 09/13/2012 0255   RBC 4.36* 09/08/2012 1729   HGB 9.0* 09/13/2012 0255   HGB 13.6* 09/08/2012 1729   HCT 27.5* 09/13/2012 0255   HCT 43.4* 09/08/2012 1729   PLT 137* 09/13/2012 0255   MCV 92.0 09/13/2012 0255   MCV 99.6* 09/08/2012 1729   MCH 30.1 09/13/2012 0255   MCH 31.2 09/08/2012 1729   MCHC 32.7 09/13/2012 0255   MCHC 31.3* 09/08/2012 1729   RDW 15.3 09/13/2012 0255   LYMPHSABS 1.0 09/13/2012 0255   MONOABS 1.2* 09/13/2012 0255   EOSABS 0.0 09/13/2012 0255   BASOSABS 0.0 09/13/2012 0255    BMET    Component Value Date/Time   NA 142 09/14/2012 0530   K 3.4* 09/14/2012 0530   CL 106 09/14/2012 0530   CO2 29 09/14/2012 0530   GLUCOSE 122* 09/14/2012 0530   BUN 27* 09/14/2012 0530   CREATININE 0.64 09/14/2012 0530   CALCIUM 8.1* 09/14/2012 0530   GFRNONAA >90 09/14/2012 0530   GFRAA >90 09/14/2012 0530    INR    Component Value Date/Time   INR 1.47 09/11/2012 0357     Intake/Output Summary (Last 24 hours) at 09/15/12 0750 Last data filed at 09/15/12 0300  Gross per 24 hour  Intake    960 ml  Output    286 ml  Net    674 ml     Assessment/Plan:  71 y.o. male is s/p repair ruptured AAA POD 7  -doing well -continue to mobilize/OOB to chair tid with meals. -? Advance diet-will d/w Dr. Donnetta Hutching -continue IS 10x q2hr while  awake -Still on 5LO2-CXR yesterday with improving aeration  Leontine Locket, PA-C [facility] [phone] 09/15/2012 7:50 AM    I have examined the patient, reviewed and agree with above.  Anona Giovannini, MD 09/15/2012 3:30 PM

## 2012-09-15 NOTE — PMR Pre-admission (Signed)
PMR Admission Coordinator Pre-Admission Assessment  Patient: Brandon [name] is an 71 y.o., male MRN: [id number] DOB: [birth year] Height: 6' (182.9 cm) Weight: 96.979 kg (213 lb 12.8 oz)  Insurance Information HMO:  Yes   PPO:       PCP:       IPA:       80/20:       OTHER:  Group #[id number] PRIMARY: Blue Medicare GHN      Policy#: [id number]      Subscriber: [place] Name: Macie Burows      Phone#: [phone]     Fax#: [phone] Pre-Cert#:                            Employer: Self employed Benefits:  Phone #: [phone]     Name: [facility]. Date: 11/16/11     Deduct: $0      Out of Pocket Max: $3400      Life Max: unlimited CIR: w/auth $170 days 1-6      SNF: w/auth $0 days 1-10, $50 days 11-100 Outpatient: No visit limits     Co-Pay: $35/visit Home Health: w/auth  100%      Co-Pay: none DME: 80%     Co-Pay: 20% Providers: in network  Emergency Contact Information Contact Information    Name Relation Home Work Mobile   [place] Spouse [phone]  [phone]     Current Medical History  Patient Admitting Diagnosis: Deconditioning after rupture of AAA   History of Present Illness:  A 72 y.o. 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 He was taken to OR emergently for resection and grafting win insertion of aorto by common iliac graft by Dr. Kellie Simmering on 09/09/12. Required 10 units PRBC, 6 units FFP and 2 units of platelets intraoperatively. Intubated through 10/28. Has required diuresis for fluid overload. Is requiring NRB occasionally during sleep. Post op ileus resolving and clear liquids initiated.  Currently on full liquid diet now.  Past Medical History  History reviewed. No pertinent past medical history.  Family History  family history includes Cancer in his father and Dementia in his mother.  Prior Rehab/Hospitalizations:  No previous rehab admissions.   Current  Medications  Current facility-administered medications:0.45 % sodium chloride infusion, , Intravenous, Continuous, Wilhelmina Mcardle, MD, Last Rate: 20 mL/hr at 09/13/12 1310;  acetaminophen (TYLENOL) solution 650 mg, 650 mg, Per Tube, Q4H PRN, Jyl Heinz, MD, 650 mg at 09/09/12 2052;  albuterol (PROVENTIL) (5 MG/ML) 0.5% nebulizer solution 2.5 mg, 2.5 mg, Nebulization, Q6H, Wilhelmina Mcardle, MD, 2.5 mg at 09/15/12 0810 albuterol (PROVENTIL) (5 MG/ML) 0.5% nebulizer solution 2.5 mg, 2.5 mg, Nebulization, Q3H PRN, Wilhelmina Mcardle, MD;  antiseptic oral rinse (BIOTENE) solution 15 mL, 15 mL, Mouth Rinse, QID, Jyl Heinz, MD, 15 mL at 09/15/12 1200;  chlorhexidine (PERIDEX) 0.12 % solution 15 mL, 15 mL, Mouth Rinse, BID, Jyl Heinz, MD, 15 mL at 09/15/12 1025 fentaNYL (SUBLIMAZE) injection 50 mcg, 50 mcg, Intravenous, Q2H PRN, Wilhelmina Mcardle, MD, 50 mcg at 09/11/12 1749;  hydrALAZINE (APRESOLINE) injection 10 mg, 10 mg, Intravenous, Q2H PRN, Nancy Nordmann Roczniak, PA;  ipratropium (ATROVENT) nebulizer solution 0.5 mg, 0.5 mg, Nebulization, Q6H, Wilhelmina Mcardle, MD, 0.5 mg at 09/15/12 0810 labetalol (NORMODYNE,TRANDATE) injection 10 mg, 10 mg, Intravenous, Q2H PRN, Richrd Prime, PA,  10 mg at 09/09/12 0047;  metoprolol (LOPRESSOR) injection 2-5 mg, 2-5 mg, Intravenous, Q2H PRN, Wilhelmina Mcardle, MD, 2.5 mg at 09/11/12 1200;  metoprolol tartrate (LOPRESSOR) tablet 25 mg, 25 mg, Oral, BID, Regina J Brandon, PA, 25 mg at 09/15/12 1026;  ondansetron (ZOFRAN) injection 4 mg, 4 mg, Intravenous, Q6H PRN, Nancy Nordmann Roczniak, PA phenol (CHLORASEPTIC) mouth spray 1 spray, 1 spray, Mouth/Throat, PRN, Richrd Prime, [place], 1 spray at 09/12/12 0902;  potassium chloride SA (K-DUR,KLOR-CON) CR tablet 20-40 mEq, 20-40 mEq, Oral, Daily PRN, Richrd Prime, PA  Patients Current Diet: Full Liquid  Precautions / Restrictions Precautions Precautions: Fall Restrictions Weight Bearing Restrictions: No   Prior Activity  Level Community (5-7x/wk): Went out daily.  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 / [place] Devices/Equipment: None Home Adaptive Equipment: Environmental consultant - rolling;Built-in shower seat  Prior Functional Level Prior Function Level of Independence: Independent Able to Take Stairs?: Yes Driving: Yes Vocation: Full time employment Comments: works in Biomedical scientist  Current Functional Level Cognition  Arousal/Alertness: Awake/alert Overall Cognitive Status: Appears within functional limits for tasks assessed/performed Difficult to assess due to: Other (comment) (pt with soft voice and on nonrebreather) Orientation Level: Oriented X4 Cognition - Other Comments: Appears intact, but difficult to accurately asses due to non re-breather    Extremity Assessment (includes Sensation/Coordination)  RUE ROM/Strength/Tone: Within functional levels RUE Coordination: WFL - gross/fine motor  RLE ROM/Strength/Tone: Deficits RLE ROM/Strength/Tone Deficits: grossly 3+/5    ADLs  Eating/Feeding: Set up Where Assessed - Eating/Feeding: Chair Grooming: Performed;Wash/dry face;Brushing hair;Shaving;Minimal assistance Where Assessed - Grooming: Supported sitting Upper Body Bathing: Moderate assistance Where Assessed - Upper Body Bathing: Supported sitting Lower Body Bathing: Maximal assistance Where Assessed - Lower Body Bathing: Supported sit to stand Upper Body Dressing: Maximal assistance Where Assessed - Upper Body Dressing: Unsupported sitting Lower Body Dressing: +1 Total assistance Where Assessed - Lower Body Dressing: Unsupported sit to stand Toilet Transfer: +2 Total assistance Toilet Transfer: Patient Percentage: 60% Toilet Transfer Method: Sit to stand;Stand pivot Science writer: Other (comment) (recliner) Toileting - Clothing Manipulation and Hygiene: +1 Total assistance Where Assessed - Toileting Clothing Manipulation and  Hygiene: Standing Equipment Used: Gait belt;Rolling walker Transfers/Ambulation Related to ADLs: ambulated with min assist and RW, second person for safety, equipment and to follow with chair.  Pt on 6L 02 with sats remaining in mid 84s. ADL Comments: Pt already up in chair.  Very willing to work with OT on grooming tasks in sitting.  02 at 92% on 6L in sitting, upon removal of 02 for shaving upper lip, decreased to mid 80s.  Instructed pt in pacing and purse lip breathing during exertion.    Mobility  Bed Mobility: Sit to Sidelying Left Supine to Sit: 1: +2 Total assist Supine to Sit: Patient Percentage: 40% Sitting - Scoot to Edge of Bed: 1: +2 Total assist Sitting - Scoot to Edge of Bed: Patient Percentage: 50% Sit to Sidelying Left: 4: Min assist;Other (comment);HOB flat (for LEs) Sit to Sidelying Left: Patient Percentage: 70%    Transfers  Transfers: Sit to Stand;Stand to Sit;Stand Pivot Transfers Sit to Stand: 4: Min assist;With upper extremity assist;With armrests;From chair/3-in-1 Sit to Stand: Patient Percentage: 70% Stand to Sit: 4: Min assist;With upper extremity assist;To chair/3-in-1 Stand to Sit: Patient Percentage: 70% Stand Pivot Transfers: 1: +2 Total assist Stand Pivot Transfers: Patient Percentage: 50%    Ambulation / Gait / Stairs / Emergency planning/management officer  Ambulation/Gait Ambulation/Gait Assistance: 4: Min assist Ambulation/Gait: Patient Percentage: 70% Ambulation Distance (Feet): 100 Feet (100' x 2) Assistive device: Rolling walker Ambulation/Gait Assistance Details: Verbal cues to stand more erect.  Hyperextension of bil knees but no buckling. Gait Pattern: Step-through pattern;Decreased stride length;Right genu recurvatum;Left genu recurvatum Gait velocity: decr    Posture / Balance Static Sitting Balance Static Sitting - Balance Support: Feet supported Static Sitting - Level of Assistance: 5: Stand by assistance Dynamic Sitting Balance Dynamic Sitting -  Balance Support: Feet supported Dynamic Sitting - Level of Assistance: 4: Min assist Static Standing Balance Static Standing - Balance Support: Bilateral upper extremity supported (on walker) Static Standing - Level of Assistance: 5: Stand by assistance     Previous Home Environment Living Arrangements: Spouse/significant other Lives With: Spouse Available Help at Discharge: Available PRN/intermittently (spouse works full time) Type of Home: House Home Layout: One level Home Access: Stairs to enter Entrance Stairs-Rails: Can reach both Entrance Stairs-Number of Steps: 1 Bathroom Shower/Tub: Multimedia programmer: Standard Bathroom Accessibility: Yes How Accessible: Accessible via walker Home Care Services: No  Discharge Living Setting Plans for Discharge Living Setting: Patient's home;House;Lives with (comment) (Lives with wife.) Type of Home at Discharge: House Discharge Home Layout: One level Discharge Home Access: Stairs to enter Entrance Stairs-Number of Steps: 3 Do you have any problems obtaining your medications?: No (N/A)  Social/Family/Support Systems Patient Roles: Spouse (Has 3 sisters and 1 brother.) Contact Information: Kanyon Grun - spouse (c) [phone] or [phone]; Mallory Shirk - sister (w) [phone] (c) [phone] Anticipated Caregiver: Brother and sisters can share care Ability/Limitations of Caregiver: Wife works [employer] variable times.  Brother works here at [facility] [employer] [facility] [employer].  Sisters also work. Caregiver Availability: Intermittent Discharge Plan Discussed with Primary Caregiver: Yes Is Caregiver In Agreement with Plan?: Yes Does Caregiver/Family have Issues with Lodging/Transportation while Pt is in Rehab?: No  Goals/Additional Needs Patient/Family Goal for Rehab: PT/OT mod I/S, no ST needs Expected length of stay: 7-10 days Cultural Considerations: None Dietary Needs: Full liquids Equipment Needs: TBD Pt/Family Agrees to Admission and willing to  participate: Yes Program Orientation Provided & Reviewed with Pt/Caregiver Including Roles  & Responsibilities: Yes  Patient Condition: Please see physician update to information in consult dated 09/15/12.  Preadmission Screen Completed By:  Retta Diones, 09/15/2012 1:18 PM ______________________________________________________________________   Discussed status with Dr. Naaman Plummer on 11/01/13at 1335 and received telephone approval for admission today.  Admission Coordinator:  Retta Diones, time1335/Date11/01/13

## 2012-09-15 NOTE — H&P (Signed)
Physical Medicine and Rehabilitation Admission H&P    Chief Complaint  Patient presents with  . Deconditioning due to ruptured aneurysm.  : HPI: Brandon [name] is a 71 y.o. 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 He was taken to OR emergently for resection and grafting win insertion of aorto by common iliac graft by Dr. Kellie Simmering on 09/09/12. Required 10 units PRBC, 6 units FFP and 2 units of platelets intraoperatively. Intubated through 10/28. Has required diuresis for fluid overload.  Post op ileus resolving and diet slowly being advanced.  Therapies ongoing and CIR recommended by therapy team.      History reviewed. No pertinent past medical history.  Past Surgical History  Procedure Date  . Abdominal aortic aneurysm repair 09/08/2012    Procedure: ANEURYSM ABDOMINAL AORTIC REPAIR;  Surgeon: [name], MD;  Location: [facility];  Service: Vascular;  Laterality: N/A;  open repair ruptured abdomenal aortic aneurysm  . Ganglion cyst excision     Right wrist   Family History  Problem Relation Age of Onset  . Dementia Mother   . Cancer Father    Social History: Married. Self employed land scaper. Independent and active PTA. He reports that he has been smoking --1PPD for ~53 years. He does not have any smokeless tobacco history on file. He reports that he does not drink alcohol or use illicit drugs.   Allergies: No Known Allergies   Scheduled Meds:   . albuterol  2.5 mg Nebulization Q6H  . antiseptic oral rinse  15 mL Mouth Rinse QID  . chlorhexidine  15 mL Mouth Rinse BID  . ipratropium  0.5 mg Nebulization Q6H  . metoprolol tartrate  25 mg Oral BID    Medications Prior to Admission  Medication Sig Dispense Refill  . ibuprofen (ADVIL,MOTRIN) 200 MG tablet Take 400 mg by mouth every morning.        Home: Home Living Lives With: Spouse Available Help at Discharge: Available  PRN/intermittently (spouse works full time) Type of Home: House Home Access: Stairs to enter Technical brewer of Steps: 1 Entrance Stairs-Rails: Can reach both Home Layout: One level Bathroom Shower/Tub: Multimedia programmer: Standard Bathroom Accessibility: Yes How Accessible: Accessible via [place]: Walker - rolling;Built-in shower seat   Functional History: Prior Function Able to Take Stairs?: Yes Driving: Yes Vocation: Full time employment Comments: works in Theatre stage manager Status:  Mobility: Bed Mobility Bed Mobility: Sit to Sidelying Left Supine to Sit: 1: +2 Total assist Supine to Sit: Patient Percentage: 40% Sitting - Scoot to Edge of Bed: 1: +2 Total assist Sitting - Scoot to Edge of Bed: Patient Percentage: 50% Sit to Sidelying Left: 4: Min assist;Other (comment);HOB flat (for LEs) Sit to Sidelying Left: Patient Percentage: 70% Transfers Transfers: Sit to Stand;Stand to Sit;Stand Pivot Transfers Sit to Stand: 4: Min assist;With upper extremity assist;With armrests;From chair/3-in-1 Sit to Stand: Patient Percentage: 70% Stand to Sit: 4: Min assist;With upper extremity assist;To chair/3-in-1 Stand to Sit: Patient Percentage: 70% Stand Pivot Transfers: 1: +2 Total assist Stand Pivot Transfers: Patient Percentage: 50% Ambulation/Gait Ambulation/Gait Assistance: 4: Min assist Ambulation/Gait: Patient Percentage: 70% Ambulation Distance (Feet): 100 Feet (100' x 2) Assistive device: Rolling walker Ambulation/Gait Assistance Details: Verbal cues to stand more erect.  Hyperextension of bil knees but no buckling. Gait Pattern: Step-through pattern;Decreased stride length;Right genu recurvatum;Left genu recurvatum Gait velocity: decr  ADL: ADL Eating/Feeding: Set up Where Assessed - Eating/Feeding: Chair Grooming: Performed;Wash/dry face;Brushing hair;Shaving;Minimal assistance Where Assessed - Grooming: Supported  sitting Upper Body Bathing: Moderate assistance Where Assessed - Upper Body Bathing: Supported sitting Lower Body Bathing: Maximal assistance Where Assessed - Lower Body Bathing: Supported sit to stand Upper Body Dressing: Maximal assistance Where Assessed - Upper Body Dressing: Unsupported sitting Lower Body Dressing: +1 Total assistance Where Assessed - Lower Body Dressing: Unsupported sit to stand Toilet Transfer: +2 Total assistance Toilet Transfer Method: Sit to stand;Stand pivot Toilet Transfer Equipment: Other (comment) (recliner) Equipment Used: Gait belt;Rolling walker Transfers/Ambulation Related to ADLs: ambulated with min assist and RW, second person for safety, equipment and to follow with chair.  Pt on 6L 02 with sats remaining in mid 94s. ADL Comments: Pt already up in chair.  Very willing to work with OT on grooming tasks in sitting.  02 at 92% on 6L in sitting, upon removal of 02 for shaving upper lip, decreased to mid 80s.  Instructed pt in pacing and purse lip breathing during exertion.  Cognition: Cognition Arousal/Alertness: Awake/alert Orientation Level: Oriented X4 Cognition Overall Cognitive Status: Appears within functional limits for tasks assessed/performed Difficult to assess due to: Other (comment) (pt with soft voice and on nonrebreather) Arousal/Alertness: Awake/alert Orientation Level: Appears intact for tasks assessed Behavior During Session: [facility] for tasks performed Cognition - Other Comments: Appears intact, but difficult to accurately asses due to non re-breather   Blood pressure 126/80, pulse 96, temperature 99.3 F (37.4 C), temperature source Oral, resp. rate 20, height 6' (1.829 m), weight 96.979 kg (213 lb 12.8 oz), SpO2 91.00%. '@PHYSEXAMBYAGE2'$ @  Results for orders placed during the hospital encounter of 09/08/12 (from the past 48 hour(s))  GLUCOSE, CAPILLARY     Status: Abnormal   Collection Time   09/13/12  3:27 PM      Component Value  Range Comment   Glucose-Capillary 119 (*) 70 - 99 mg/dL   GLUCOSE, CAPILLARY     Status: Abnormal   Collection Time   09/13/12  7:17 PM      Component Value Range Comment   Glucose-Capillary 134 (*) 70 - 99 mg/dL    Comment 1 Notify RN      Comment 2 Documented in Chart     GLUCOSE, CAPILLARY     Status: Abnormal   Collection Time   09/14/12 12:21 AM      Component Value Range Comment   Glucose-Capillary 113 (*) 70 - 99 mg/dL    Comment 1 Notify RN      Comment 2 Documented in Chart     GLUCOSE, CAPILLARY     Status: Abnormal   Collection Time   09/14/12  3:54 AM      Component Value Range Comment   Glucose-Capillary 110 (*) 70 - 99 mg/dL    Comment 1 Notify RN      Comment 2 Documented in Chart     COMPREHENSIVE METABOLIC PANEL     Status: Abnormal   Collection Time   09/14/12  5:30 AM      Component Value Range Comment   Sodium 142  135 - 145 mEq/L    Potassium 3.4 (*) 3.5 - 5.1 mEq/L    Chloride 106  96 - 112 mEq/L    CO2 29  19 - 32 mEq/L    Glucose, Bld 122 (*) 70 - 99 mg/dL    BUN 27 (*) 6 - 23 mg/dL    Creatinine, Ser 0.64  0.50 - 1.35 mg/dL    Calcium 8.1 (*) 8.4 - 10.5 mg/dL    Total Protein 5.5 (*) 6.0 - 8.3 g/dL    Albumin 2.3 (*) 3.5 - 5.2 g/dL    AST 36  0 - 37 U/L    ALT 27  0 - 53 U/L    Alkaline Phosphatase 60  39 - 117 U/L    Total Bilirubin 1.4 (*) 0.3 - 1.2 mg/dL    GFR calc non Af Amer >90  >90 mL/min    GFR calc Af Amer >90  >90 mL/min    Dg Chest Port 1 View  09/14/2012  *RADIOLOGY REPORT*  Clinical Data: Respiratory failure, follow-up  PORTABLE CHEST - 1 VIEW  Comparison: Portable chest x-ray of 09/13/2012  Findings: The lungs appear slightly better aerated.  Airspace disease has diminished somewhat.  There are still bilateral effusions with basilar atelectasis, left greater than right and cardiomegaly is stable. Minimal pulmonary vascular congestion remains.  The venous sheath has been removed from the SVC and no pneumothorax is noted.   IMPRESSION: Improved aeration.  Persistent basilar opacities consistent with atelectasis and effusions.  Minimal pulmonary vascular congestion remains.   Original Report Authenticated By: Ivar Drape, M.D.     Post Admission Physician Evaluation: 1. Functional deficits secondary  to deconditioning after AAA repair and multiple medical. 2. Patient is admitted to receive collaborative, interdisciplinary care between the physiatrist, rehab nursing staff, and therapy team. 3. Patient's level of medical complexity and substantial therapy needs in context of that medical necessity cannot be provided at a lesser intensity of care such as a SNF. 4. Patient has experienced substantial functional loss from his/her baseline which was documented above under the "Functional History" and "Functional Status" headings.  Judging by the patient's diagnosis, physical exam, and functional history, the patient has potential for functional progress which will result in measurable gains while on inpatient rehab.  These gains will be of substantial and practical use upon discharge  in facilitating mobility and self-care at the household level. 5. Physiatrist will provide 24 hour management of medical needs as well as oversight of the therapy plan/treatment and provide guidance as appropriate regarding the interaction of the two. 6. 24 hour rehab nursing will assist with bladder management, bowel management, safety, skin/wound care, disease management, medication administration, pain management and patient education  and help integrate therapy concepts, techniques,education, etc. 7. PT will assess and treat for:  fxnl mobility, strength, stamina, safety, adaptive equipment.  Goals are: mod I. 8. OT will assess and treat for: upper ext strength, ADL's, fxnl mobility, adaptive equipment and techniques, safety.   Goals are: mod I to min assist. 9. SLP will assess and treat for: n/a.  Goals are: n/a. 10. Case Management and Social  Worker will assess and treat for psychological issues and discharge planning. 11. Team conference will be held weekly to assess progress toward goals and to determine barriers to discharge. 12. Patient will receive at least 3 hours of therapy per day at least 5 days per week. 13. ELOS: 7-10 days      Prognosis:  excellent   Medical Problem List and Plan: 1. DVT Prophylaxis/Anticoagulation: Mechanical: Sequential compression devices, below knee Bilateral lower extremities 2. Pain Management: tylenol or tramadol for breakthrough pain. No significant pain on exam today 3. Mood: team to provide ego support as necessary. Pt is fairly positive at this point.  4. Neuropsych: This patient is capable of making decisions on his/her own  behalf. 5. ABLA: check follow up labs 11/04. May add iron supplement if GI symptoms improve. 6. Hypokalemia:  Will check follow up labs 11/04. Likely dilutional and due to ABLA.  7. Leucocytosis: Likely reactive.  Monitor for fever.  Monitor wound.  8. Post op ileus: continues on full liquid diet.  He is having regular, semi-formed bm's. Abdomen still distended 9. Hypoxia: Pulmonary toilet with IS. Continue nebs for now. Despite appearance pt states his breathing is steadily improving.  09/15/2012, Oval Linsey, MD

## 2012-09-15 NOTE — Discharge Summary (Signed)
[facility] Discharge Summary  ANGELOS WASCO 05/08/1941 71 y.o. male  [id number]  Admission Date: 09/08/2012  Discharge Date: 09/15/12  Physician: Pryor Ochoa, MD  Admission Diagnosis: Abdominal aortic aneurysm rupture [441.3] Possible AAA   HPI:   This is a 71 y.o. male who presents for evaluation of ruptured abdominal aortic aneurysm. Patient developed severe abdominal and back pain about 3 hours prior to arrival to the emergency department. He was found by ultrasound have a large infrarenal abdominal aortic aneurysm. CT scan revealed rupture of the abdominal aortic aneurysm with no satisfactory infrarenal neck for stent grafting he was taken immediately to the operating room for open repair. Family denied any previous knowledge of aneurysm.   Hospital Course:  The patient was admitted to the hospital and taken to the operating room on 09/08/2012 - 09/09/2012 and underwent Resection and grafting of ruptured abdominal aortic aneurysm with insertion of an aorto by common iliac graft using Hemashield Dacron graft Bilateral Fogarty thrombectomy of the iliac system.  He received multiple units of PRBCs, FFP, and platelets intraoperatively.  The pt tolerated the procedure well and was transported to the PACU in good condition. The pt is a heavy smoker and remained intubated after surgery.    By POD 1, he remained intubated as he had a tense abdomen and diffuse edema. By POD 2, his renal function was stable with a creatinine of 1.47 and BUN of 27 with excellent UOP.  He did receive more blood products on POD 2 and remained intubated.  By POD 3, he was alert and following commands and being weaned from the vent.  He was extubated later that morning.  He was started on a beta blocker post operatively.  He was also under going diuresis and pulmonary toilet.  He is transferred to CIR on POD 7.  He was transferred to the stepdown unit on POD 5 where he underwent  increasing mobilization.  He was transferred to the telemetry floor on POD 6.  Pt did have a 2D echo, which revealed an EF of 55-60% with mild moderate hypokinesis of the basal inferior myocardium.  The aortic root was upper normal in size.  There was no AS, AI, MS, MR, or TR.  The remainder of the hospital course consisted of increasing mobilization and increasing intake of solids without difficulty.  CBC    Component Value Date/Time   WBC 11.8* 09/13/2012 0255   WBC 16.5* 09/08/2012 1729   RBC 2.99* 09/13/2012 0255   RBC 4.36* 09/08/2012 1729   HGB 9.0* 09/13/2012 0255   HGB 13.6* 09/08/2012 1729   HCT 27.5* 09/13/2012 0255   HCT 43.4* 09/08/2012 1729   PLT 137* 09/13/2012 0255   MCV 92.0 09/13/2012 0255   MCV 99.6* 09/08/2012 1729   MCH 30.1 09/13/2012 0255   MCH 31.2 09/08/2012 1729   MCHC 32.7 09/13/2012 0255   MCHC 31.3* 09/08/2012 1729   RDW 15.3 09/13/2012 0255   LYMPHSABS 1.0 09/13/2012 0255   MONOABS 1.2* 09/13/2012 0255   EOSABS 0.0 09/13/2012 0255   BASOSABS 0.0 09/13/2012 0255    BMET    Component Value Date/Time   NA 142 09/14/2012 0530   K 3.4* 09/14/2012 0530   CL 106 09/14/2012 0530   CO2 29 09/14/2012 0530   GLUCOSE 122* 09/14/2012 0530   BUN 27* 09/14/2012 0530   CREATININE 0.64 09/14/2012 0530   CALCIUM 8.1* 09/14/2012 0530   GFRNONAA >90 09/14/2012 0530   GFRAA >90  09/14/2012 0530     Discharge Instructions:   The patient is discharged to home with extensive instructions on wound care and progressive ambulation.  They are instructed not to drive or perform any heavy lifting until returning to see the physician in his office.  Discharge Orders    Future Orders Please Complete By Expires   Resume previous diet      Driving Restrictions      Comments:   No driving for 2 weeks   Lifting restrictions      Comments:   No lifting for 6 weeks   Call MD for:  temperature >100.5      Call MD for:  redness, tenderness, or signs of infection (pain,  swelling, bleeding, redness, odor or green/yellow discharge around incision site)      Call MD for:  severe or increased pain, loss or decreased feeling  in affected limb(s)      ABDOMINAL PROCEDURE/ANEURYSM REPAIR/AORTO-BIFEMORAL BYPASS:  Call MD for increased abdominal pain; cramping diarrhea; nausea/vomiting      Discharge wound care:      Comments:   Shower daily with soap and water starting 09/16/12      Discharge Diagnosis:  Abdominal aortic aneurysm rupture [441.3] Possible AAA  Secondary Diagnosis: Patient Active Problem List  Diagnosis  . Tobacco abuse  . Respiratory failure, post-operative  . Syncope  . AAA (abdominal aortic aneurysm, ruptured)  . Hypertension  . Hyperglycemia  . DIC (disseminated intravascular coagulation)  . Chronic obstructive pulmonary disease with bronchospasm   History reviewed. No pertinent past medical history.    Zakk, Kaemmerer  Home Medication Instructions QMV:[id number]   Printed on:09/15/12 1327  Medication Information                    ibuprofen (ADVIL,MOTRIN) 200 MG tablet Take 400 mg by mouth every morning.           albuterol (PROVENTIL) (5 MG/ML) 0.5% nebulizer solution Take 0.5 mLs (2.5 mg total) by nebulization every 3 (three) hours as needed for wheezing or shortness of breath.           albuterol (PROVENTIL) (5 MG/ML) 0.5% nebulizer solution Take 0.5 mLs (2.5 mg total) by nebulization every 6 (six) hours.           metoprolol tartrate (LOPRESSOR) 25 MG tablet Take 1 tablet (25 mg total) by mouth 2 (two) times daily.           oxyCODONE (ROXICODONE) 5 MG immediate release tablet Take 1 tablet (5 mg total) by mouth every 6 (six) hours as needed for pain.             Disposition: CIR  Patient's condition: is Good  Follow up: 1. Dr. Hart [place]  in 2 weeks   Doreatha Massed, PA-C [facility] [phone] 09/15/2012  1:27 PM

## 2012-09-16 ENCOUNTER — Inpatient Hospital Stay (HOSPITAL_COMMUNITY): Payer: Medicare Other | Admitting: Physical Therapy

## 2012-09-16 ENCOUNTER — Inpatient Hospital Stay (HOSPITAL_COMMUNITY): Payer: Medicare Other

## 2012-09-16 ENCOUNTER — Inpatient Hospital Stay (HOSPITAL_COMMUNITY): Payer: Medicare Other | Admitting: Occupational Therapy

## 2012-09-16 DIAGNOSIS — J449 Chronic obstructive pulmonary disease, unspecified: Secondary | ICD-10-CM

## 2012-09-16 DIAGNOSIS — K560 Paralytic ileus: Secondary | ICD-10-CM

## 2012-09-16 DIAGNOSIS — R5381 Other malaise: Secondary | ICD-10-CM

## 2012-09-16 DIAGNOSIS — J4489 Other specified chronic obstructive pulmonary disease: Secondary | ICD-10-CM

## 2012-09-16 LAB — URINE MICROSCOPIC-ADD ON

## 2012-09-16 LAB — URINALYSIS, ROUTINE W REFLEX MICROSCOPIC
Glucose, UA: 250 mg/dL — AB
Ketones, ur: NEGATIVE mg/dL
Nitrite: POSITIVE — AB
Protein, ur: 30 mg/dL — AB
Specific Gravity, Urine: 1.025 (ref 1.005–1.030)
Urobilinogen, UA: 4 mg/dL — ABNORMAL HIGH (ref 0.0–1.0)
pH: 5.5 (ref 5.0–8.0)

## 2012-09-16 MED ORDER — MOXIFLOXACIN HCL 400 MG PO TABS
400.0000 mg | ORAL_TABLET | Freq: Every day | ORAL | Status: DC
  Administered 2012-09-16 – 2012-09-21 (×6): 400 mg via ORAL
  Filled 2012-09-16 (×9): qty 1

## 2012-09-16 NOTE — Evaluation (Signed)
Physical Therapy Assessment and Plan  Patient Details  Name: Brandon [name] MRN: [id number] Date of Birth: 10-Mar-1941  PT Diagnosis: Difficulty walking and Muscle weakness Rehab Potential: Good ELOS: 10-14 days   Today's Date: 09/16/2012 Time: 1100-1200 Time Calculation (min): 60 min  Problem List:  Patient Active Problem List  Diagnosis  . Tobacco abuse  . Respiratory failure, post-operative  . Syncope  . AAA (abdominal aortic aneurysm, ruptured)  . Hypertension  . Hyperglycemia  . DIC (disseminated intravascular coagulation)  . Chronic obstructive pulmonary disease with bronchospasm    Past Medical History:  Past Medical History  Diagnosis Date  . Shortness of breath   . COPD (chronic obstructive pulmonary disease)   . Peripheral vascular disease    Past Surgical History:  Past Surgical History  Procedure Date  . Abdominal aortic aneurysm repair 09/08/2012    Procedure: ANEURYSM ABDOMINAL AORTIC REPAIR;  Surgeon: [name], MD;  Location: [facility];  Service: Vascular;  Laterality: N/A;  open repair ruptured abdomenal aortic aneurysm  . Ganglion cyst excision     Right wrist    Assessment & Plan Clinical Impression: Brandon [name] is a 71 y.o. 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 He was taken to OR emergently for resection and grafting win insertion of aorto by common iliac graft by Dr. Kellie Simmering on 09/09/12. Required 10 units PRBC, 6 units FFP and 2 units of platelets intraoperatively. Intubated through 10/28. Has required diuresis for fluid overload. Post op ileus resolving and diet slowly being advanced. Therapies ongoing and CIR recommended by therapy team.   Patient transferred to CIR on 09/15/2012 .   Patient currently requires min with mobility secondary to muscle weakness.  Prior to hospitalization, patient was Independent with mobility and lived with Spouse in a  House home.  Home access is 1Stairs to enter.  Patient will benefit from skilled PT intervention to maximize safe functional mobility, minimize fall risk and decrease caregiver burden for planned discharge home with intermittent assist.  Anticipate patient will not need PT follow up at discharge.  PT - End of Session Endurance Deficit: Yes Endurance Deficit Description: needs multiple rest breaks and O2 Sat monitoring during activity PT Assessment Rehab Potential: Good Barriers to Discharge: None PT Plan PT Frequency: 1-2 X/day, 60-90 minutes Estimated Length of Stay: 10-14 days PT Treatment/Interventions: Ambulation/gait training;Balance/vestibular training;DME/adaptive equipment instruction;Functional mobility training;Patient/family education;Therapeutic Activities;Therapeutic Exercise;UE/LE Strength taining/ROM PT Recommendation Follow Up Recommendations: None  PT Evaluation Precautions/Restrictions Precautions Precautions: Fall Restrictions Weight Bearing Restrictions: No General '@[id number]'$ [phone]) Vital Signs Therapy Vitals Pulse Rate: 95  Resp: 30  BP: 125/75 mmHg Patient Position, if appropriate: Sitting Oxygen Therapy SpO2: 96 % (patient desaturates on RA to 89%) O2 Device: Nasal cannula O2 Flow Rate (L/min): 4 L/min Pain Pain Assessment Pain Assessment: 0-10 Pain Type: Acute pain Pain Location: Abdomen Pain Orientation: Mid Pain Descriptors: Aching;Discomfort Pain Onset: On-going Patients Stated Pain Goal: 2 Pain Intervention(s): Medication (See eMAR) Multiple Pain Sites: No Home Living/Prior Functioning Home Living Lives With: Spouse Available Help at Discharge: Available PRN/intermittently (spouse works fulltime) Type of Home: House Home Access: Stairs to enter Technical brewer of Steps: 1 Entrance Stairs-Rails: Can reach both Home Layout: One level Bathroom Shower/Tub: Multimedia programmer: Standard Bathroom Accessibility:  Yes How Accessible: Accessible via walker Home Adaptive Equipment: Gilford Rile - rolling;Built-in shower seat Prior Function Level of Independence: Independent with basic  ADLs;Independent with homemaking with ambulation;Independent with gait;Independent with transfers Able to Take Stairs?: Yes Driving: Yes Vocation: Full time employment Vision/Perception  Vision - History Baseline Vision: No visual deficits Patient Visual Report: No change from baseline  Cognition Overall Cognitive Status: Appears within functional limits for tasks assessed Orientation Level: Oriented X4 Sensation Sensation Light Touch: Appears Intact Coordination Gross Motor Movements are Fluid and Coordinated: Yes Heel Shin Test: [facility] Motor  Motor Motor: Within Functional Limits  Mobility Bed Mobility Bed Mobility: Rolling Right;Rolling Left;Right Sidelying to Sit;Left Sidelying to Sit;Sitting - Scoot to [employer] of Bed;Sit to Sidelying Right;Sit to Sidelying Left;Scooting to [facility] Rolling Right: With rail;5: Supervision Rolling Right Details: Verbal cues for technique;Verbal cues for precautions/safety Rolling Left: 5: Supervision;With rail Rolling Left Details: Verbal cues for technique;Verbal cues for precautions/safety Right Sidelying to Sit: 4: Min assist Right Sidelying to Sit Details: Verbal cues for technique;Verbal cues for precautions/safety;Manual facilitation for weight shifting Left Sidelying to Sit: 4: Min assist Left Sidelying to Sit Details: Verbal cues for technique;Verbal cues for precautions/safety;Manual facilitation for weight shifting Sitting - Scoot to Edge of Bed: 5: Supervision Sitting - Scoot to Edge of Bed Details: Verbal cues for technique;Verbal cues for precautions/safety Sit to Sidelying Right: 4: Min assist Sit to Sidelying Right Details: Verbal cues for technique;Verbal cues for precautions/safety;Manual facilitation for weight shifting Sit to Sidelying Right Details (indicate cue type  and reason): HOB flat, B LE's Sit to Sidelying Left: 4: Min assist Sit to Sidelying Left Details: Verbal cues for technique;Verbal cues for precautions/safety;Manual facilitation for weight shifting Sit to Sidelying Left Details (indicate cue type and reason): HOB flat, B LE's Scooting to HOB: 4: Min assist Scooting to [facility] Details: Manual facilitation for weight shifting Transfers Sit to Stand: 4: Min guard;With upper extremity assist;With armrests;From chair/3-in-1 Stand to Sit: With upper extremity assist;With armrests;To chair/3-in-1 Stand Pivot Transfers: 4: Min Psychologist, occupational Details: Verbal cues for technique;Verbal cues for precautions/safety Stand Pivot Transfer Details (indicate cue type and reason): o2 line management, steady while pivoting Locomotion  Ambulation Ambulation: Yes Ambulation/Gait Assistance: 5: Supervision Ambulation Distance (Feet): 100 Feet Assistive device: Rolling walker Ambulation/Gait Assistance Details: Verbal cues for precautions/safety Ambulation/Gait Assistance Details: O2 at 4L via [place] in tow with O2 sats down to 91%, encouraged pursed lip breathing Gait Gait: Yes Gait Pattern: Step-through pattern;Decreased step length - right;Decreased step length - left Gait velocity: slow Stairs / Additional Locomotion Stairs: No Wheelchair Mobility Wheelchair Mobility: No  Trunk/Postural Assessment  Cervical Assessment Cervical Assessment: Within Functional Limits Thoracic Assessment Thoracic Assessment: Within Functional Limits Lumbar Assessment Lumbar Assessment: Within Functional Limits Postural Control Postural Control: Within Functional Limits  Balance Balance Balance Assessed: Yes Static Standing Balance Static Standing - Balance Support: During functional activity Static Standing - Level of Assistance: 5: Stand by assistance Extremity Assessment  RLE Assessment RLE Assessment: Within Functional Limits LLE Assessment LLE  Assessment: Within Functional Limits  See FIM for current functional status Refer to Care Plan for Long Term Goals  Recommendations for other services: None  Discharge Criteria: Patient will be discharged from PT if patient refuses treatment 3 consecutive times without medical reason, if treatment goals not met, if there is a change in medical status, if patient makes no progress towards goals or if patient is discharged from hospital.  The above assessment, treatment plan, treatment alternatives and goals were discussed and mutually agreed upon: by patient  Clearence Ped 09/16/2012, 12:19 PM

## 2012-09-16 NOTE — Progress Notes (Signed)
Subjective/Complaints: Didn't sleep well because of bed,mattress, call bell, etc A 12 point review of systems has been performed and if not noted above is otherwise negative.   Objective: Vital Signs: Blood pressure 131/72, pulse 89, temperature 99.2 F (37.3 C), temperature source Oral, resp. rate 17, height 5\' 11"  (1.803 m), weight 98.1 kg (216 lb 4.3 oz), SpO2 95.00%. No results found. No results found for this basename: WBC:2,HGB:2,HCT:2,PLT:2 in the last 72 hours  Basename 09/14/12 0530 09/13/12 1318  NA 142 --  K 3.4* 3.5  CL 106 --  CO2 29 --  GLUCOSE 122* --  BUN 27* --  CREATININE 0.64 --  CALCIUM 8.1* --   CBG (last 3)   Basename 09/14/12 0354 09/14/12 0021 09/13/12 1917  GLUCAP 110* 113* 134*    Wt Readings from Last 3 Encounters:  09/15/12 98.1 kg (216 lb 4.3 oz)  09/14/12 96.979 kg (213 lb 12.8 oz)  09/14/12 96.979 kg (213 lb 12.8 oz)    Physical Exam:   General: Alert and oriented x 3, no distress HEENT: Head is normocephalic, atraumatic, PERRLA, EOMI, sclera anicteric, oral mucosa pink and moist, dentition intact, ext ear canals clear,  Neck: Supple without JVD or lymphadenopathy Heart: Reg rate and rhythm. No murmurs rubs or gallops Chest: CTA bilaterally with decreased airway sounds today. Occ rhonchi. Abdomen: Soft, non-tender,  distended, bowel sounds positive. Staples in place Extremities: No clubbing, cyanosis, or edema. Pulses are 2+ Skin: Clean and intact without signs of breakdown Neuro: Pt is cognitively appropriate with normal insight, memory, and awareness. Cranial nerves 2-12 are intact. Sensory exam is normal. Reflexes are 2+ in all 4's. Fine motor coordination is intact. No tremors. Motor function is grossly 5/5.  Musculoskeletal: Full ROM, No pain with AROM or PROM in the neck, trunk, or extremities. Posture appropriate Psych: Pt's affect is appropriate. Pt is cooperative    Assessment/Plan: 1. Functional deficits secondary to  deconditioning after AAA repair, multiple medical, which require 3+ hours per day of interdisciplinary therapy in a comprehensive inpatient rehab setting. Physiatrist is providing close team supervision and 24 hour management of active medical problems listed below. Physiatrist and rehab team continue to assess barriers to discharge/monitor patient progress toward functional and medical goals. FIM:       FIM - Toileting Toileting: 5: Supervision: Safety issues/verbal cues  FIM - Diplomatic Services operational officer Devices: Bedside commode Toilet Transfers: 5-To toilet/BSC: Supervision (verbal cues/safety issues)  FIM - Games developer Transfer: 5: Bed > Chair or W/C: Supervision (verbal cues/safety issues)     Comprehension Comprehension Mode: Auditory Comprehension: 7-Follows complex conversation/direction: With no assist  Expression Expression Mode: Verbal Expression: 7-Expresses complex ideas: With no assist  Social Interaction Social Interaction: 7-Interacts appropriately with others - No medications needed.  Problem Solving Problem Solving: 6-Solves complex problems: With extra time  Memory Memory: 6-More than reasonable amt of time  Medical Problem List and Plan:  1. DVT Prophylaxis/Anticoagulation: Mechanical: Sequential compression devices, below knee Bilateral lower extremities  2. Pain Management: tylenol or tramadol for breakthrough pain. No significant pain on exam today  3. Mood: team to provide ego support as necessary. Pt is fairly positive at this point.  4. Neuropsych: This patient is capable of making decisions on his/her own behalf.  5. ABLA: check follow up labs 11/04. May add iron supplement if GI symptoms improve.  6. Hypokalemia: Will check follow up labs 11/04. Likely dilutional and due to ABLA.  7. Leucocytosis: Likely reactive. Monitor  for fever. Monitor wound.  8. Post op ileus: continues on full liquid diet. He is having  regular, semi-formed bm's. Abdomen still distended  9. Hypoxia: Pulmonary toilet with IS. Continue nebs for now. Despite appearance pt states his breathing is steadily improving  -showing improvement  LOS (Days) 1 A FACE TO FACE EVALUATION WAS PERFORMED  Saleena Tamas T 09/16/2012, 8:32 AM

## 2012-09-16 NOTE — Progress Notes (Signed)
Physical Therapy Session Note  Patient Details  Name: Brandon [name] MRN: [id number] Date of Birth: 11-Feb-1941  Today's Date: 09/16/2012 Time: 1430-1500 Time Calculation (min): 30 min  Therapy Documentation Precautions:  Precautions: Fall Restrictions Weight Bearing Restrictions: No Vital Signs: Therapy Vitals Temp: 99.5 F (37.5 C) Temp src: Oral Pulse Rate: 94  Resp: 20  BP: 127/77 mmHg Patient Position, if appropriate: Lying Oxygen Therapy SpO2: 95 % O2 Device: Nasal cannula O2 Flow Rate (L/min): 4 L/min Pain: denies pain currently  Therapeutic Activity:(15') Bed Mobility Right side lying <-> sitting with Supervision, transfers with supervision for O2 line management Therapeutic Exercise:(15') Nu-Step Level 3, x 7 minutes with 2 rest breaks (O2 sats at 92% with 6L via [place] during exercise, otherwise 4L via [place] at rest with O2 sats at 95%   Therapy/Group: Individual Therapy  Zuri Lascala J 09/16/2012, 4:55 PM

## 2012-09-16 NOTE — Evaluation (Signed)
Occupational Therapy Assessment and Plan  Patient Details  Name: Brandon [name] MRN: [id number] Date of Birth: 1941/09/11  OT Diagnosis: muscle weakness (generalized) Rehab Potential: Rehab Potential: Good ELOS: 10-14 days   Today's Date: 09/16/2012 Time: 800-900 and  1300-1330 Time Calculation (min): 60 min and 30 min  Visit 1: Pt seen for the initial evaluation and ADL retraining with bathing, dressing, and toileting with a focus on sit to stand, standing, and activity tolerance.  Pt did very well this am requiring close supervision as he fatigues easily and O2 sat level drop after 1-2 minutes of standing.  Pt has a great deal of difficulty reaching to his feet level.  May benefit from  AE.  Visit 2: Pt used reacher to don underwear over feet without difficulty. Left it in his room for him to use.  Pt also donned socks without difficulty using sock aid. Pt worked on functional mobility with ambulating to toilet with RW with close supervision and toileted with distant supervision.  Pt transferred to supine with supervision to rest prior to PT session.  Problem List:  Patient Active Problem List  Diagnosis  . Tobacco abuse  . Respiratory failure, post-operative  . Syncope  . AAA (abdominal aortic aneurysm, ruptured)  . Hypertension  . Hyperglycemia  . DIC (disseminated intravascular coagulation)  . Chronic obstructive pulmonary disease with bronchospasm    Past Medical History:  Past Medical History  Diagnosis Date  . Shortness of breath   . COPD (chronic obstructive pulmonary disease)   . Peripheral vascular disease    Past Surgical History:  Past Surgical History  Procedure Date  . Abdominal aortic aneurysm repair 09/08/2012    Procedure: ANEURYSM ABDOMINAL AORTIC REPAIR;  Surgeon: [name], MD;  Location: [place];  Service: Vascular;  Laterality: N/A;  open repair ruptured abdomenal aortic aneurysm  . Ganglion cyst excision     Right wrist    Assessment &  Plan Clinical Impression: Brandon [name] is a 71 y.o. 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 He was taken to OR emergently for resection and grafting win insertion of aorto by common iliac graft by Dr. Kellie Simmering on 09/09/12. Required 10 units PRBC, 6 units FFP and 2 units of platelets intraoperatively. Intubated through 10/28. Has required diuresis for fluid overload. Post op ileus resolving and diet slowly being advanced  Patient transferred to CIR on 09/15/2012 .    Patient currently requires mod with basic self-care skills of LB dressing, min assist with bathing, supervision toileting secondary to muscle weakness, decreased oxygen support and decreased standing balance.  Prior to hospitalization, patient was fully independent and working full time.  Patient will benefit from skilled intervention to increase independence with basic self-care skills and increase level of independence with iADL prior to discharge home with care partner.  Anticipate patient will require intermittent supervision and follow up home health.  OT - End of Session Activity Tolerance: Tolerates < 10 min activity with changes in vital signs Endurance Deficit: Yes Endurance Deficit Description: needs multiple rest breaks and O2 Sat monitoring during activity OT Assessment Rehab Potential: Good Barriers to Discharge: Decreased caregiver support Barriers to Discharge Comments: wife works full time OT Plan OT Frequency: 1-2 X/day, 60-90 minutes;5 out of 7 days Estimated Length of Stay: 10-14 days OT Treatment/Interventions: Discharge planning;Balance/vestibular training;DME/adaptive equipment instruction;Functional mobility training;Patient/family education;Self Care/advanced ADL retraining;Therapeutic Activities;Therapeutic Exercise;UE/LE  Strength taining/ROM;UE/LE Coordination activities OT Recommendation Follow Up  Recommendations: Home health OT  OT Evaluation Precautions/Restrictions  Precautions Precautions: Fall Restrictions Weight Bearing Restrictions: No   Vital Signs Therapy Vitals Pulse Rate: 95  Resp: 30  BP: 125/75 mmHg Patient Position, if appropriate: Sitting Oxygen Therapy SpO2: 96 % (patient desaturates on RA to 89%) O2 Device: Nasal cannula O2 Flow Rate (L/min): 4 L/min Pain Pain Assessment Pain Assessment: 0-10 Pain Type: Acute pain Pain Location: Abdomen Pain Orientation: Mid Pain Descriptors: Aching;Discomfort Pain Onset: On-going Patients Stated Pain Goal: 2 Pain Intervention(s): Medication (See eMAR) Multiple Pain Sites: No Home Living/Prior Functioning Home Living Lives With: Spouse Available Help at Discharge: Available PRN/intermittently (spouse works fulltime) Type of Home: House Home Access: Stairs to enter Technical brewer of Steps: 1 Entrance Stairs-Rails: Can reach both Home Layout: One level Bathroom Shower/Tub: Multimedia programmer: Standard Bathroom Accessibility: Yes How Accessible: Accessible via walker Home Adaptive Equipment: Gilford Rile - rolling;Built-in shower seat Prior Function Level of Independence: Independent with basic ADLs;Independent with homemaking with ambulation;Independent with gait;Independent with transfers Able to Take Stairs?: Yes Driving: Yes Vocation: Full time employment ADL - Refer to FIM   Vision/Perception  Vision - History Baseline Vision: Wears glasses all the time Patient Visual Report: No change from baseline Vision - Assessment Eye Alignment: Within Functional Limits Perception Perception: Within Functional Limits Praxis Praxis: Intact  Cognition Overall Cognitive Status: Appears within functional limits for tasks assessed Orientation Level: Oriented X4 Sensation Sensation Light Touch: Appears Intact Stereognosis: Appears Intact Hot/Cold: Appears Intact Proprioception: Appears  Intact Coordination Gross Motor Movements are Fluid and Coordinated: Yes Fine Motor Movements are Fluid and Coordinated: Yes Heel Shin Test: [facility] Motor  Motor Motor: Within Functional Limits Mobility  Bed Mobility Bed Mobility: Rolling Right;Rolling Left;Right Sidelying to Sit;Left Sidelying to Sit;Sitting - Scoot to [employer] of Bed;Sit to Sidelying Right;Sit to Sidelying Left;Scooting to [facility] Rolling Right: With rail;5: Supervision Rolling Right Details: Verbal cues for technique;Verbal cues for precautions/safety Rolling Left: 5: Supervision;With rail Rolling Left Details: Verbal cues for technique;Verbal cues for precautions/safety Right Sidelying to Sit: 4: Min assist Right Sidelying to Sit Details: Verbal cues for technique;Verbal cues for precautions/safety;Manual facilitation for weight shifting Left Sidelying to Sit: 4: Min assist Left Sidelying to Sit Details: Verbal cues for technique;Verbal cues for precautions/safety;Manual facilitation for weight shifting Sitting - Scoot to Edge of Bed: 5: Supervision Sitting - Scoot to Edge of Bed Details: Verbal cues for technique;Verbal cues for precautions/safety Sit to Sidelying Right: 4: Min assist Sit to Sidelying Right Details: Verbal cues for technique;Verbal cues for precautions/safety;Manual facilitation for weight shifting Sit to Sidelying Right Details (indicate cue type and reason): HOB flat, B LE's Sit to Sidelying Left: 4: Min assist Sit to Sidelying Left Details: Verbal cues for technique;Verbal cues for precautions/safety;Manual facilitation for weight shifting Sit to Sidelying Left Details (indicate cue type and reason): HOB flat, B LE's Scooting to HOB: 4: Min assist Scooting to [facility] Details: Manual facilitation for weight shifting Transfers Sit to Stand: 4: Min guard;With upper extremity assist;With armrests;From chair/3-in-1 Stand to Sit: With upper extremity assist;With armrests;To chair/3-in-1  Trunk/Postural Assessment   Cervical Assessment Cervical Assessment: Within Functional Limits Thoracic Assessment Thoracic Assessment: Within Functional Limits Lumbar Assessment Lumbar Assessment: Within Functional Limits Postural Control Postural Control: Within Functional Limits  Balance Balance Balance Assessed: Yes Static Sitting Balance Static Sitting - Level of Assistance: 7: Independent Dynamic Sitting Balance Dynamic Sitting - Level of Assistance: 5: Stand by assistance Static  Standing Balance Static Standing - Balance Support: During functional activity Static Standing - Level of Assistance: 5: Stand by assistance Dynamic Standing Balance Dynamic Standing - Level of Assistance: 4: Min assist Extremity/Trunk Assessment RUE Assessment RUE Assessment: Within Functional Limits LUE Assessment LUE Assessment: Within Functional Limits  See FIM for current functional status Refer to Care Plan for Long Term Goals  Recommendations for other services: None  Discharge Criteria: Patient will be discharged from OT if patient refuses treatment 3 consecutive times without medical reason, if treatment goals not met, if there is a change in medical status, if patient makes no progress towards goals or if patient is discharged from hospital.  The above assessment, treatment plan, treatment alternatives and goals were discussed and mutually agreed upon: by patient  [facility] 09/16/2012, 12:48 PM

## 2012-09-17 ENCOUNTER — Inpatient Hospital Stay (HOSPITAL_COMMUNITY): Payer: Medicare Other

## 2012-09-17 ENCOUNTER — Inpatient Hospital Stay (HOSPITAL_COMMUNITY): Payer: Medicare Other | Admitting: Physical Therapy

## 2012-09-17 MED ORDER — METOCLOPRAMIDE HCL 5 MG PO TABS
5.0000 mg | ORAL_TABLET | Freq: Three times a day (TID) | ORAL | Status: DC
  Administered 2012-09-17 – 2012-09-21 (×16): 5 mg via ORAL
  Filled 2012-09-17 (×22): qty 1

## 2012-09-17 NOTE — Progress Notes (Signed)
Subjective/Complaints: Slept well. Feels he may have "over done it " yesterday A 12 point review of systems has been performed and if not noted above is otherwise negative.   Objective: Vital Signs: Blood pressure 143/80, pulse 94, temperature 98.1 F (36.7 C), temperature source Oral, resp. rate 20, height '5\' 11"'$  (1.803 m), weight 98.1 kg (216 lb 4.3 oz), SpO2 94.00%. Dg Chest Port 1 View  09/16/2012  *RADIOLOGY REPORT*  Clinical Data: Fever, cough and shortness of breath.  PORTABLE CHEST - 1 VIEW  Comparison: Chest x-ray 09/14/2012.  Findings: Bibasilar opacities may reflect areas of atelectasis and/or consolidation.  Probable small bilateral pleural effusions (left greater than right). There is cephalization of the pulmonary vasculature and slight indistinctness of the interstitial markings suggestive of mild pulmonary edema.  Mild cardiomegaly. Atherosclerosis in the thoracic aorta.  IMPRESSION: 1.  Low lung volumes with bibasilar atelectasis and/or consolidation and superimposed small bilateral pleural effusions. 2.  Mild interstitial pulmonary edema. 3.  Atherosclerosis.   Original Report Authenticated By: Vinnie Langton, M.D.    No results found for this basename: WBC:2,HGB:2,HCT:2,PLT:2 in the last 72 hours No results found for this basename: NA:2,K:2,CL:2,CO2:2,GLUCOSE:2,BUN:2,CREATININE:2,CALCIUM:2 in the last 72 hours CBG (last 3)  No results found for this basename: GLUCAP:3 in the last 72 hours  Wt Readings from Last 3 Encounters:  09/15/12 98.1 kg (216 lb 4.3 oz)  09/14/12 96.979 kg (213 lb 12.8 oz)  09/14/12 96.979 kg (213 lb 12.8 oz)    Physical Exam:   General: Alert and oriented x 3, no distress HEENT: Head is normocephalic, atraumatic, PERRLA, EOMI, sclera anicteric, oral mucosa pink and moist, dentition intact, ext ear canals clear,  Neck: Supple without JVD or lymphadenopathy Heart: Reg rate and rhythm. No murmurs rubs or gallops Chest: CTA bilaterally with  decreased airway sounds today. Occ rhonchi. Abdomen: Soft, non-tender,  distended, bowel sounds high pitched.Jodell Cipro in place Extremities: No clubbing, cyanosis, or edema. Pulses are 2+ Skin: Clean and intact without signs of breakdown Neuro: Pt is cognitively appropriate with normal insight, memory, and awareness. Cranial nerves 2-12 are intact. Sensory exam is normal. Reflexes are 2+ in all 4's. Fine motor coordination is intact. No tremors. Motor function is grossly 5/5.  Musculoskeletal: Full ROM, No pain with AROM or PROM in the neck, trunk, or extremities. Posture appropriate Psych: Pt's affect is appropriate. Pt is cooperative    Assessment/Plan: 1. Functional deficits secondary to deconditioning after AAA repair, multiple medical, which require 3+ hours per day of interdisciplinary therapy in a comprehensive inpatient rehab setting. Physiatrist is providing close team supervision and 24 hour management of active medical problems listed below. Physiatrist and rehab team continue to assess barriers to discharge/monitor patient progress toward functional and medical goals. FIM: FIM - Bathing Bathing Steps Patient Completed: Chest;Right Arm;Left Arm;Abdomen;Front perineal area;Buttocks;Left upper leg;Right upper leg Bathing: 4: Min-Patient completes 8-9 27f10 parts or 75+ percent  FIM - Upper Body Dressing/Undressing Upper body dressing/undressing steps patient completed: Thread/unthread right sleeve of pullover shirt/dresss;Thread/unthread left sleeve of pullover shirt/dress;Put head through opening of pull over shirt/dress;Pull shirt over trunk Upper body dressing/undressing: 5: Set-up assist to: Obtain clothing/put away FIM - Lower Body Dressing/Undressing Lower body dressing/undressing steps patient completed: Pull underwear up/down;Pull pants up/down Lower body dressing/undressing: 2: Max-Patient completed 25-49% of tasks  FIM - Toileting Toileting steps completed by patient:  Adjust clothing prior to toileting;Performs perineal hygiene;Adjust clothing after toileting Toileting Assistive Devices: Grab bar or rail for support Toileting: 5: Supervision: Safety  issues/verbal cues  FIM - Radio producer Devices: Grab bars;Walker Toilet Transfers: 5-To toilet/BSC: Supervision (verbal cues/safety issues);5-From toilet/BSC: Supervision (verbal cues/safety issues)  FIM - Control and instrumentation engineer Devices: Copy: 4: Supine > Sit: Min A (steadying Pt. > 75%/lift 1 leg);4: Sit > Supine: Min A (steadying pt. > 75%/lift 1 leg);4: Chair or W/C > Bed: Min A (steadying Pt. > 75%)  FIM - Locomotion: Ambulation Locomotion: Ambulation Assistive Devices: Walker - Rolling Ambulation/Gait Assistance: 5: Supervision Locomotion: Ambulation: 2: Travels 50 - 149 ft with supervision/safety issues  Comprehension Comprehension Mode: Auditory Comprehension: 7-Follows complex conversation/direction: With no assist  Expression Expression Mode: Verbal Expression: 7-Expresses complex ideas: With no assist  Social Interaction Social Interaction: 7-Interacts appropriately with others - No medications needed.  Problem Solving Problem Solving: 6-Solves complex problems: With extra time  Memory Memory: 6-More than reasonable amt of time  Medical Problem List and Plan:  1. DVT Prophylaxis/Anticoagulation: Mechanical: Sequential compression devices, below knee Bilateral lower extremities  2. Pain Management: tylenol or tramadol for breakthrough pain. No significant pain on exam today  3. Mood: team to provide ego support as necessary. Pt is fairly positive at this point.  4. Neuropsych: This patient is capable of making decisions on his/her own behalf.  5. ABLA: check follow up labs 11/04. May add iron supplement if GI symptoms improve.  6. Hypokalemia: Will check follow up labs 11/04. Likely dilutional and due to ABLA.    7. Leucocytosis: Likely reactive. Monitor for fever. Monitor wound.  8. Post op ileus: continues on full liquid diet. He is having regular, semi-formed bm's, gas but abdomen still quite distended  -reglan 9. Hypoxia: Pulmonary toilet with IS. Continue nebs for now. Despite appearance pt states his breathing is steadily improving  -showing improvement 10. Fever-  ?consolidations on CXR  -ua equivocal, ucx pending  -empriric avelox  -no further temps since yesterday  LOS (Days) 2 A FACE TO FACE EVALUATION WAS PERFORMED  Cianna Kasparian T 09/17/2012, 8:20 AM

## 2012-09-17 NOTE — Progress Notes (Signed)
Physical Therapy Note  Patient Details  Name: Brandon [name] MRN: [id number] Date of Birth: [birth year] Today's Date: 09/17/2012  [id number] (40 minutes) individual Pain : no complaint of pain   Oxygen sats (resting) 98% on 4L [place]  Pulse 98 Focus of treatment: Therapeutic exercises focused on general conditioning/ bilateral LE strengthening; gait training/endurance Treatment: transfers stand /pivot without AD SBA; Nustep Level 3 X 10 minutes (Oxygen sats post 3 minutes 91%  Pulse 100) ;alternate stepping to 6 inch step with bilateral rail support X 10 for quad strengthening ( Oxygen sats 88%); gait - 80 feet X 1 RW SBA (assist with oxygen).    Setareh Rom,JIM 09/17/2012, 7:45 AM

## 2012-09-18 ENCOUNTER — Inpatient Hospital Stay (HOSPITAL_COMMUNITY): Payer: Medicare Other | Admitting: Occupational Therapy

## 2012-09-18 ENCOUNTER — Inpatient Hospital Stay (HOSPITAL_COMMUNITY): Payer: Medicare Other | Admitting: Physical Therapy

## 2012-09-18 DIAGNOSIS — K560 Paralytic ileus: Secondary | ICD-10-CM

## 2012-09-18 DIAGNOSIS — J449 Chronic obstructive pulmonary disease, unspecified: Secondary | ICD-10-CM

## 2012-09-18 DIAGNOSIS — J4489 Other specified chronic obstructive pulmonary disease: Secondary | ICD-10-CM

## 2012-09-18 DIAGNOSIS — R5381 Other malaise: Secondary | ICD-10-CM

## 2012-09-18 LAB — URINE CULTURE
Colony Count: 9000
Special Requests: NORMAL

## 2012-09-18 LAB — COMPREHENSIVE METABOLIC PANEL
ALT: 51 U/L (ref 0–53)
AST: 51 U/L — ABNORMAL HIGH (ref 0–37)
Albumin: 2.2 g/dL — ABNORMAL LOW (ref 3.5–5.2)
Alkaline Phosphatase: 72 U/L (ref 39–117)
BUN: 16 mg/dL (ref 6–23)
CO2: 29 mEq/L (ref 19–32)
Calcium: 8.1 mg/dL — ABNORMAL LOW (ref 8.4–10.5)
Chloride: 100 mEq/L (ref 96–112)
Creatinine, Ser: 0.72 mg/dL (ref 0.50–1.35)
GFR calc Af Amer: >90 mL/min (ref >90)
GFR calc non Af Amer: >90 mL/min (ref >90)
Glucose, Bld: 114 mg/dL — ABNORMAL HIGH (ref 70–99)
Potassium: 3 mEq/L — ABNORMAL LOW (ref 3.5–5.1)
Sodium: 137 mEq/L (ref 135–145)
Total Bilirubin: 1.4 mg/dL — ABNORMAL HIGH (ref 0.3–1.2)
Total Protein: 5.4 g/dL — ABNORMAL LOW (ref 6.0–8.3)

## 2012-09-18 LAB — CBC WITH DIFFERENTIAL/PLATELET
Basophils Absolute: 0 10*3/uL (ref 0.0–0.1)
Basophils Relative: 0 % (ref 0–1)
Eosinophils Absolute: 0.3 10*3/uL (ref 0.0–0.7)
Eosinophils Relative: 2 % (ref 0–5)
HCT: 32 % — ABNORMAL LOW (ref 39.0–52.0)
Hemoglobin: 10.6 g/dL — ABNORMAL LOW (ref 13.0–17.0)
Lymphocytes Relative: 8 % — ABNORMAL LOW (ref 12–46)
Lymphs Abs: 1.1 10*3/uL (ref 0.7–4.0)
MCH: 30.7 pg (ref 26.0–34.0)
MCHC: 33.1 g/dL (ref 30.0–36.0)
MCV: 92.8 fL (ref 78.0–100.0)
Monocytes Absolute: 1.2 10*3/uL — ABNORMAL HIGH (ref 0.1–1.0)
Monocytes Relative: 8 % (ref 3–12)
Neutro Abs: 12.1 10*3/uL — ABNORMAL HIGH (ref 1.7–7.7)
Neutrophils Relative %: 82 % — ABNORMAL HIGH (ref 43–77)
Platelets: 306 10*3/uL (ref 150–400)
RBC: 3.45 MIL/uL — ABNORMAL LOW (ref 4.22–5.81)
RDW: 14.9 % (ref 11.5–15.5)
WBC: 14.7 10*3/uL — ABNORMAL HIGH (ref 4.0–10.5)

## 2012-09-18 MED ORDER — POTASSIUM CHLORIDE CRYS ER 20 MEQ PO TBCR
20.0000 meq | EXTENDED_RELEASE_TABLET | Freq: Two times a day (BID) | ORAL | Status: DC
  Administered 2012-09-18 – 2012-09-21 (×7): 20 meq via ORAL
  Filled 2012-09-18 (×10): qty 1

## 2012-09-18 NOTE — Progress Notes (Signed)
Social Work  Social Work Assessment and Plan  Patient Details  Name: Brandon [name] MRN: [id number] Date of Birth: 15-Jul-1941  Today's Date: 09/18/2012  Problem List:  Patient Active Problem List  Diagnosis  . Tobacco abuse  . Respiratory failure, post-operative  . Syncope  . AAA (abdominal aortic aneurysm, ruptured)  . Hypertension  . Hyperglycemia  . DIC (disseminated intravascular coagulation)  . Chronic obstructive pulmonary disease with bronchospasm   Past Medical History:  Past Medical History  Diagnosis Date  . Shortness of breath   . COPD (chronic obstructive pulmonary disease)   . Peripheral vascular disease    Past Surgical History:  Past Surgical History  Procedure Date  . Abdominal aortic aneurysm repair 09/08/2012    Procedure: ANEURYSM ABDOMINAL AORTIC REPAIR;  Surgeon: Pryor Ochoa, MD;  Location: [facility] OR;  Service: Vascular;  Laterality: N/A;  open repair ruptured abdomenal aortic aneurysm  . Ganglion cyst excision     Right wrist   Social History:  reports that he quit smoking 10 days ago. He does not have any smokeless tobacco history on file. He reports that he does not drink alcohol or use illicit drugs.  Family / Support Systems Marital Status: Married How Long?: 44 years Patient Roles: Spouse (Has 3 sisters and 1 brother.) Spouse/Significant Other: wife, Brandon [name] @ (H) [phone] or (C) [phone] Children: none Other Supports: several siblings living locally Anticipated Caregiver: Brother and sisters can share care Ability/Limitations of Caregiver: Wife works [employer] variable times.  Brother works here at [employer].  Sisters also work. 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) Return to Work Plans: "I think the good Brandon [name] has finally told this 71 year old man that it's time to slow down.Marland KitchenMarland KitchenI'll get guys to do my work and I'll sit in the truck with the air conditioner on." Legal Hisotry/Current Legal Issues: none Guardian/Conservator: None   Abuse/Neglect Physical Abuse: Denies Verbal Abuse: Denies Sexual Abuse: Denies Exploitation of patient/patient's resources: Denies Self-Neglect: Denies  Emotional Status Pt's affect, behavior adn adjustment status: Pt very pleasant, talkative and noteably gracious "...to the good Lord for saving me."  Becomes a little tearful when he talks about surviving this acute rupture and his plans to stop smoking "last week".  Denies any s/s of depression or anxiety.  No panic episodes since the surgery. Will monitor and request neuropsych involvement if needed Recent Psychosocial Issues: none Pyschiatric History: none Substance Abuse History: none except 69 years of one pack per day of cigarettes  Patient / Family Perceptions, Expectations & Goals Pt/Family understanding of illness & functional limitations: Pt and wife with good, basic understanding of medical events that occurred and surgical interventions as well as current restrictions and functional limitations. Premorbid pt/family roles/activities: Both pt and wife are very active with their work.  Sharing in household responsibilities. Anticipated changes in roles/activities/participation: Pt prepared to scale back the amount of physical work her does with his job.  Wife to assume caregiver role as needed. Pt/family expectations/goals: "I want to build my strength up"  [employer]: None Premorbid Home Care/DME Agencies: None Transportation available at discharge: yes Resource referrals recommended: Neuropsychology  Discharge Planning Living Arrangements:  Spouse/significant other Support Systems: Spouse/significant  other;Other relatives;Church/faith community Type of Residence: Private residence Insurance Resources: [employer] Financial Resources: Social Security;Employment Financial Screen Referred: No Living Expenses: Own Money Management: Patient Do you have any problems obtaining your medications?: No Home Management: shared Patient/Family Preliminary Plans: Pt plans to return home with wife.  Hopes to reach mod i level so wife will only need to take limited time from work. Pt notes his local family can also provide intermittent assistance Social Work Anticipated Follow Up Needs: HH/OP Expected length of stay: 7-10 days  Clinical Impression Pleasant, oriented and very motivated gentleman here after AAA rupture.  Expressing much gratitude "... To the good Lord for saving me."  Good family support and anticipating modified independent goals.  No significant emotional distress reported or noted.  Will monitor throughout CIR stay.   Bellarae Lizer 09/18/2012, 10:39 AM

## 2012-09-18 NOTE — Progress Notes (Signed)
Subjective/Complaints: Slept well. Belly feels better. Ready to advance diet. A 12 point review of systems has been performed and if not noted above is otherwise negative.   Objective: Vital Signs: Blood pressure 143/73, pulse 86, temperature 98.1 F (36.7 C), temperature source Oral, resp. rate 18, height '5\' 11"'$  (1.803 m), weight 98.1 kg (216 lb 4.3 oz), SpO2 96.00%. Dg Abd 1 View  09/17/2012   *RADIOLOGY REPORT*  Clinical Data: Recent abdominal aneurysm repair.  Now with bloating.  ABDOMEN - 1 VIEW  Comparison:  None.  Findings: The bowel gas pattern is normal.  No radio-opaque calculi or other significant radiographic abnormality is seen. Surgical clips left mid abdomen and pelvis.  IMPRESSION: Negative.   Original Report Authenticated By: Rolla Flatten, M.D.    Dg Chest Port 1 View  09/16/2012  *RADIOLOGY REPORT*  Clinical Data: Fever, cough and shortness of breath.  PORTABLE CHEST - 1 VIEW  Comparison: Chest x-ray 09/14/2012.  Findings: Bibasilar opacities may reflect areas of atelectasis and/or consolidation.  Probable small bilateral pleural effusions (left greater than right). There is cephalization of the pulmonary vasculature and slight indistinctness of the interstitial markings suggestive of mild pulmonary edema.  Mild cardiomegaly. Atherosclerosis in the thoracic aorta.  IMPRESSION: 1.  Low lung volumes with bibasilar atelectasis and/or consolidation and superimposed small bilateral pleural effusions. 2.  Mild interstitial pulmonary edema. 3.  Atherosclerosis.   Original Report Authenticated By: Vinnie Langton, M.D.     Basename 09/18/12 0615  WBC 14.7*  HGB 10.6*  HCT 32.0*  PLT 306    Basename 09/18/12 0615  NA 137  K 3.0*  CL 100  CO2 29  GLUCOSE 114*  BUN 16  CREATININE 0.72  CALCIUM 8.1*   CBG (last 3)  No results found for this basename: GLUCAP:3 in the last 72 hours  Wt Readings from Last 3 Encounters:  09/15/12 98.1 kg (216 lb 4.3 oz)  09/14/12 96.979 kg (213  lb 12.8 oz)  09/14/12 96.979 kg (213 lb 12.8 oz)    Physical Exam:   General: Alert and oriented x 3, no distress HEENT: Head is normocephalic, atraumatic, PERRLA, EOMI, sclera anicteric, oral mucosa pink and moist, dentition intact, ext ear canals clear,  Neck: Supple without JVD or lymphadenopathy Heart: Reg rate and rhythm. No murmurs rubs or gallops Chest: CTA bilaterally with decreased airway sounds today. Occ rhonchi. Abdomen: Soft, non-tender,  distended, bowel sounds high pitched.Jodell Cipro in place Extremities: No clubbing, cyanosis, or edema. Pulses are 2+ Skin: Clean and intact without signs of breakdown Neuro: Pt is cognitively appropriate with normal insight, memory, and awareness. Cranial nerves 2-12 are intact. Sensory exam is normal. Reflexes are 2+ in all 4's. Fine motor coordination is intact. No tremors. Motor function is grossly 5/5.  Musculoskeletal: Full ROM, No pain with AROM or PROM in the neck, trunk, or extremities. Posture appropriate Psych: Pt's affect is appropriate. Pt is cooperative    Assessment/Plan: 1. Functional deficits secondary to deconditioning after AAA repair, multiple medical, which require 3+ hours per day of interdisciplinary therapy in a comprehensive inpatient rehab setting. Physiatrist is providing close team supervision and 24 hour management of active medical problems listed below. Physiatrist and rehab team continue to assess barriers to discharge/monitor patient progress toward functional and medical goals. FIM: FIM - Bathing Bathing Steps Patient Completed: Chest;Right Arm;Left Arm;Abdomen;Front perineal area;Buttocks;Left upper leg;Right upper leg Bathing: 4: Min-Patient completes 8-9 62f10 parts or 75+ percent  FIM - Upper Body Dressing/Undressing Upper body  dressing/undressing steps patient completed: Thread/unthread right sleeve of pullover shirt/dresss;Thread/unthread left sleeve of pullover shirt/dress;Put head through opening of  pull over shirt/dress;Pull shirt over trunk Upper body dressing/undressing: 5: Set-up assist to: Obtain clothing/put away FIM - Lower Body Dressing/Undressing Lower body dressing/undressing steps patient completed: Pull underwear up/down;Pull pants up/down Lower body dressing/undressing: 2: Max-Patient completed 25-49% of tasks  FIM - Toileting Toileting steps completed by patient: Adjust clothing prior to toileting;Performs perineal hygiene;Adjust clothing after toileting Toileting Assistive Devices: Grab bar or rail for support Toileting: 5: Supervision: Safety issues/verbal cues  FIM - Radio producer Devices: Grab bars;Walker Toilet Transfers: 5-From toilet/BSC: Supervision (verbal cues/safety issues)  FIM - Control and instrumentation engineer Devices: Copy: 4: Supine > Sit: Min A (steadying Pt. > 75%/lift 1 leg);4: Sit > Supine: Min A (steadying pt. > 75%/lift 1 leg);4: Chair or W/C > Bed: Min A (steadying Pt. > 75%)  FIM - Locomotion: Ambulation Locomotion: Ambulation Assistive Devices: Walker - Rolling Ambulation/Gait Assistance: 5: Supervision Locomotion: Ambulation: 2: Travels 50 - 149 ft with supervision/safety issues  Comprehension Comprehension Mode: Auditory Comprehension: 7-Follows complex conversation/direction: With no assist  Expression Expression Mode: Verbal Expression: 7-Expresses complex ideas: With no assist  Social Interaction Social Interaction: 7-Interacts appropriately with others - No medications needed.  Problem Solving Problem Solving: 6-Solves complex problems: With extra time  Memory Memory: 6-More than reasonable amt of time  Medical Problem List and Plan:  1. DVT Prophylaxis/Anticoagulation: Mechanical: Sequential compression devices, below knee Bilateral lower extremities  2. Pain Management: tylenol or tramadol for breakthrough pain. No significant pain on exam today  3. Mood:  team to provide ego support as necessary. Pt is fairly positive at this point.  4. Neuropsych: This patient is capable of making decisions on his/her own behalf.  5. ABLA: check follow up labs 11/04. May add iron supplement if GI symptoms improve.  6. Hypokalemia: Will check follow up labs 11/04. Likely dilutional and due to ABLA.  7. Leucocytosis: Likely reactive. Monitor for fever. Monitor wound.  8. Post op ileus: continues on full liquid diet. He is having regular, semi-formed bm's, abdomen less distended  -reglan  -kub improved  -advance diet today 9. Hypoxia: Pulmonary toilet with IS. Continue nebs for now. Despite appearance pt states his breathing is steadily improving  -showing improvement 10. Fever-  ?consolidations on CXR  -ua equivocal, ucx still pending  -empriric avelox  -no further temps since yesterday  LOS (Days) 3 A FACE TO FACE EVALUATION WAS PERFORMED  Jalecia Leon T 09/18/2012, 7:56 AM

## 2012-09-18 NOTE — Progress Notes (Signed)
This note has been reviewed and this clinician agrees with information provided.  

## 2012-09-18 NOTE — Progress Notes (Signed)
Patient ID: Brandon [name], male   DOB: January 27, 1941, 71 y.o.   MRN: [id number] Vascular Surgery Progress Note  Subjective: Severe right ICA stenosis-either asymptomatic or silent right brain stroke-history of left brain stroke approximately 4-5 weeks ago  Admitted with possible vasovagal episode-syncope-sweating  Objective:  Filed Vitals:   09/18/12 0500  BP: 143/73  Pulse: 86  Temp: 98.1 F (36.7 C)  Resp: 18    General alert and oriented x3 Neurologic normal Lungs no rhonchi or wheezing   Labs:  Lab 09/18/12 0615 09/14/12 0530 09/13/12 0255  CREATININE 0.72 0.64 0.69    Lab 09/18/12 0615 09/14/12 0530 09/13/12 1318 09/13/12 0255  NA 137 142 -- 142  K 3.0* 3.4* 3.5 --  CL 100 106 -- 104  CO2 29 29 -- 29  BUN 16 27* -- 33*  CREATININE 0.72 0.64 -- 0.69  LABGLOM -- -- -- --  GLUCOSE 114* -- -- --  CALCIUM 8.1* 8.1* -- 8.1*    Lab 09/18/12 0615 09/13/12 0255 09/12/12 0410  WBC 14.7* 11.8* 13.7*  HGB 10.6* 9.0* 9.1*  HCT 32.0* 27.5* 27.9*  PLT 306 137* 105*   No results found for this basename: INR:3 in the last 168 hours  I/O last 3 completed shifts: In: 37 [P.O.:720] Out: -   Imaging: Dg Abd 1 View  09/17/2012   *RADIOLOGY REPORT*  Clinical Data: Recent abdominal aneurysm repair.  Now with bloating.  ABDOMEN - 1 VIEW  Comparison:  None.  Findings: The bowel gas pattern is normal.  No radio-opaque calculi or other significant radiographic abnormality is seen. Surgical clips left mid abdomen and pelvis.  IMPRESSION: Negative.   Original Report Authenticated By: Rolla Flatten, M.D.    Dg Chest Port 1 View  09/16/2012  *RADIOLOGY REPORT*  Clinical Data: Fever, cough and shortness of breath.  PORTABLE CHEST - 1 VIEW  Comparison: Chest x-ray 09/14/2012.  Findings: Bibasilar opacities may reflect areas of atelectasis and/or consolidation.  Probable small bilateral pleural effusions (left greater than right). There is cephalization of the pulmonary vasculature and slight  indistinctness of the interstitial markings suggestive of mild pulmonary edema.  Mild cardiomegaly. Atherosclerosis in the thoracic aorta.  IMPRESSION: 1.  Low lung volumes with bibasilar atelectasis and/or consolidation and superimposed small bilateral pleural effusions. 2.  Mild interstitial pulmonary edema. 3.  Atherosclerosis.   Original Report Authenticated By: Vinnie Langton, M.D.     Assessment/Plan:    LOS: 3 days  s/p   Discussed situation with patient. He has severe right ICA stenosis and possible intracerebral disease distally but proximal stenosis is 95+ percent Patient needs right carotid endarterectomy and would like to proceed Plan order CE on Thursday, November 14. He will discontinue Plavix on Monday, November 11 and continue daily aspirin Risks and benefits of surgery thoroughly discussed with patient and his daughter was present. Okay to DC home today   Tinnie Gens, MD 09/18/2012 9:12 AM

## 2012-09-18 NOTE — Progress Notes (Signed)
Occupational Therapy Session Note  Patient Details  Name: Brandon [name] MRN: [id number] Date of Birth: 12-Feb-1941  Today's Date: 09/18/2012 Time: 1130-1200 Time Calculation (min): 30 min  Short Term Goals: Week 1:  OT Short Term Goal 1 (Week 1): Pt will be able to don pants with reacher. OT Short Term Goal 2 (Week 1): Pt will be able to don socks with sock aide. OT Short Term Goal 3 (Week 1): Pt will demonstrate improved activity tolerance to stand for 4-5 min without O2 levels decreasing.  Skilled Therapeutic Interventions/Progress Updates:    Self care retraining focusing on vital sign monitoring, activity tolerance, and functional mobility with RW. Pt donned socks and shoes edge of chair with increased respiratory rate and SpO2 88% with fast recovery to 92-94% with rest. Functional ambulation to ADL apartment with RW close supervision, SpO2 decreased to 88% with O2 at 4L with recovery to 94% after 30 sec rest break. Instructed in and pt performed walk-in shower transfer with RW, stepping over simulated threshold backwards and sitting on shower chair with close supervision. Initiated education on energy conservation techniques for self-care tasks and pursed lip breathing during activity; education to be continued.  Therapy Documentation Precautions:  Precautions Precautions: Fall Precaution Comments: Monitor O2 sats with activity. Restrictions Weight Bearing Restrictions: No Pain: Pain Assessment Pain Assessment: No/denies pain  See FIM for current functional status  Therapy/Group: Individual Therapy  Jackelyn Poling 09/18/2012, 12:09 PM

## 2012-09-18 NOTE — Progress Notes (Signed)
Physical Therapy Session Note  Patient Details  Name: Brandon [name] MRN: [id number] Date of Birth: June 02, 1941  Today's Date: 09/18/2012 Time: 1300-1355 Time Calculation (min): 55 min   Skilled Therapeutic Interventions/Progress Updates: gait training with RW and without AD both with supervision.  Pt requires more frequent rest breaks without AD, no LOB.  Pt educated on energy conservation with RW, pt expresses understanding.  Stair training with B handrails, supervision for 5 stairs.  Pt states he plans to have handrails put up at his home.  Kinetron for LE strength and endurance 3 min f/b 2 mins with 2 minute rest in between.  Pt O2 sats >90% throughout treatment on 4L O2 [place].     Therapy Documentation Balance: Standardized Balance Assessment Standardized Balance Assessment: Berg Balance Test Berg Balance Test Sit to Stand: Able to stand  independently using hands Standing Unsupported: Able to stand safely 2 minutes Sitting with Back Unsupported but Feet Supported on Floor or Stool: Able to sit safely and securely 2 minutes Stand to Sit: Controls descent by using hands Transfers: Able to transfer safely, definite need of hands Standing Unsupported with Eyes Closed: Able to stand 10 seconds safely Standing Ubsupported with Feet Together: Able to place feet together independently and stand 1 minute safely From Standing, Reach Forward with Outstretched Arm: Can reach forward >5 cm safely (2") From Standing Position, Pick up Object from Floor: Able to pick up shoe safely and easily From Standing Position, Turn to Look Behind Over each Shoulder: Looks behind from both sides and weight shifts well Turn 360 Degrees: Needs assistance while turning Standing Unsupported, Alternately Place Feet on Step/Stool: Able to complete >2 steps/needs minimal assist Standing Unsupported, One Foot in Front: Needs help to step but can hold 15 seconds Standing on One Leg: Unable to try or needs assist to  prevent fall Total Score: 37   See FIM for current functional status  Therapy/Group: Individual Therapy  Trejan Buda 09/18/2012, 1:57 PM

## 2012-09-18 NOTE — Progress Notes (Signed)
Occupational Therapy Session Note  Patient Details  Name: Brandon [name] MRN: [id number] Date of Birth: 21-Jan-1941  Today's Date: 09/18/2012 Time: 1003-1101 Time Calculation (min): 58 min  Short Term Goals: Week 1:  OT Short Term Goal 1 (Week 1): Pt will be able to don pants with reacher. OT Short Term Goal 2 (Week 1): Pt will be able to don socks with sock aide. OT Short Term Goal 3 (Week 1): Pt will demonstrate improved activity tolerance to stand for 4-5 min without O2 levels decreasing.  Skilled Therapeutic Interventions/Progress Updates:    Pt wanted to work on grooming tasks this morning instead of bathing and dressing.  He reported that he and his sisters helped him with that last night and the clothes he had on were clean.  Sat up on the EOB.  Oxygen sats were 86% initially on 4Ls and only got up to 90 % with extended sitting. Decreased to 84 % with ambulation to the sink for grooming tasks.  Increased O2 to 6Ls with sats increasing back to 93-94 %.  Pt sat in the wheelchair to perform grooming tasks secondary to O2 sats decreasing.  On 4Ls did not get higher than 88% at the sink.  When finished with grooming tasks, performed toilet transfer with RW with close supervision.  Performed short distance mobility to the sink and in the hallway before returning to bedside chair.  Supervision for all transfers during the session.  Sats at 91% on 4 Ls at end of session.  Therapy Documentation Precautions:  Precautions Precautions: Fall Precaution Comments: Monitor O2 sats with activity. Restrictions Weight Bearing Restrictions: No  Vital Signs: Therapy Vitals Pulse Rate: 85  Oxygen Therapy SpO2: 90 % O2 Device: Nasal cannula O2 Flow Rate (L/min): 4 L/min Pulse Oximetry Type: Intermittent Pain: Pain Assessment Pain Assessment: No/denies pain ADL: See FIM for current functional status  Therapy/Group: Individual Therapy  Jamica Woodyard OTR/L 09/18/2012, 11:30 AM

## 2012-09-18 NOTE — Progress Notes (Signed)
Patient information reviewed and entered into eRehab system by Rachard Isidro, RN, CRRN, PPS Coordinator.  Information including medical coding and functional independence measure will be reviewed and updated through discharge.     Per nursing patient was given "Data Collection Information Summary for Patients in Inpatient Rehabilitation Facilities with attached "Privacy Act Statement-Health Care Records" upon admission.  

## 2012-09-18 NOTE — Progress Notes (Signed)
Occupational Therapy Session Note  Patient Details  Name: GEDALIA MCMILLON MRN: [id number] Date of Birth: 24-Dec-1940  Today's Date: 09/18/2012 Time: 1415-1500 Time Calculation (min): 45 min  Short Term Goals: Week 1:  OT Short Term Goal 1 (Week 1): Pt will be able to don pants with reacher. OT Short Term Goal 2 (Week 1): Pt will be able to don socks with sock aide. OT Short Term Goal 3 (Week 1): Pt will demonstrate improved activity tolerance to stand for 4-5 min without O2 levels decreasing.  Skilled Therapeutic Interventions/Progress Updates:    Self care retraining with focus on increasing activity tolerance, energy conservation, and safe functional ambulation with RW household distances. Pt maneuvered around kitchen environment with RW S; discussed ways to retrieve and transport needed items; discussed energy conservation techniques for simple meal prep activities. Pt ambulated from ADL apartment to room and then to BR for toileting task; toilet transfer S and Mod I for toileting tasks. Ambulated to sink and stood for ~2-3 min for grooming tasks before sitting in recliner chair. SpO2 89% at that time on 4L O2 with fast recovery to 93-94% with rest.  Therapy Documentation Precautions:  Precautions Precautions: Fall Precaution Comments: Monitor O2 sats with activity. Restrictions Weight Bearing Restrictions: No Pain:  no c/o pain See FIM for current functional status  Therapy/Group: Individual Therapy  Jackelyn Poling 09/18/2012, 3:42 PM

## 2012-09-19 ENCOUNTER — Inpatient Hospital Stay (HOSPITAL_COMMUNITY): Payer: Medicare Other | Admitting: *Deleted

## 2012-09-19 ENCOUNTER — Inpatient Hospital Stay (HOSPITAL_COMMUNITY): Payer: Medicare Other | Admitting: Physical Therapy

## 2012-09-19 ENCOUNTER — Encounter (HOSPITAL_COMMUNITY): Payer: Medicare Other | Admitting: Occupational Therapy

## 2012-09-19 DIAGNOSIS — K560 Paralytic ileus: Secondary | ICD-10-CM

## 2012-09-19 DIAGNOSIS — R5381 Other malaise: Secondary | ICD-10-CM

## 2012-09-19 DIAGNOSIS — J4489 Other specified chronic obstructive pulmonary disease: Secondary | ICD-10-CM

## 2012-09-19 DIAGNOSIS — J449 Chronic obstructive pulmonary disease, unspecified: Secondary | ICD-10-CM

## 2012-09-19 DIAGNOSIS — D72829 Elevated white blood cell count, unspecified: Secondary | ICD-10-CM

## 2012-09-19 MED ORDER — SENNOSIDES-DOCUSATE SODIUM 8.6-50 MG PO TABS
2.0000 | ORAL_TABLET | Freq: Every day | ORAL | Status: DC
  Administered 2012-09-19 – 2012-09-20 (×2): 2 via ORAL
  Filled 2012-09-19 (×3): qty 2

## 2012-09-19 NOTE — Progress Notes (Signed)
This note has been reviewed and this clinician agrees with information provided.  

## 2012-09-19 NOTE — Progress Notes (Signed)
Occupational Therapy Session Note  Patient Details  Name: Brandon [name] MRN: [id number] Date of Birth: Dec 31, 1940  Today's Date: 09/19/2012 Time:  0730- 0830  60 minutes  Short Term Goals: Week 1:  OT Short Term Goal 1 (Week 1): Pt will be able to don pants with reacher. OT Short Term Goal 2 (Week 1): Pt will be able to don socks with sock aide. OT Short Term Goal 3 (Week 1): Pt will demonstrate improved activity tolerance to stand for 4-5 min without O2 levels decreasing.  Skilled Therapeutic Interventions/Progress Updates:    Self care retraining with bathing, dressing and grooming tasks focusing on activity tolerance, energy conservation techniques during self care tasks, and safe functional mobility with RW. SpO2 at 94% on 4L O2 before activity. Pt ambulated into BR and performed walk-in shower transfer; SpO2 84% after bathing tasks on room air, increased to ~87 after rest period, but replaced pt on 4L O2 and SpO2 increased to above 90%. Performed dressing tasks EOB with SpO2 remaining above 90% on 4L O2. Pt requires verbal reminders to pace self and take rest breaks when needed. Gave pt handout on energy conservation techniques for during ADL and IADL tasks.  Therapy Documentation Precautions:  Precautions Precautions: Fall Precaution Comments: Monitor O2 sats with activity. Restrictions Weight Bearing Restrictions: No  Pain: Pain Assessment Pain Assessment: No/denies pain Pain Score: 0-No pain  See FIM for current functional status  Therapy/Group: Individual Therapy  Jackelyn Poling 09/19/2012, 12:08 PM

## 2012-09-19 NOTE — Progress Notes (Signed)
Subjective/Complaints: Slept well. Had bm last night. Denies pain. Good appetite  A 12 point review of systems has been performed and if not noted above is otherwise negative.   Objective: Vital Signs: Blood pressure 124/65, pulse 89, temperature 98.6 F (37 C), temperature source Oral, resp. rate 19, height '5\' 11"'$  (1.803 m), weight 98.1 kg (216 lb 4.3 oz), SpO2 95.00%. Dg Abd 1 View  09/17/2012   *RADIOLOGY REPORT*  Clinical Data: Recent abdominal aneurysm repair.  Now with bloating.  ABDOMEN - 1 VIEW  Comparison:  None.  Findings: The bowel gas pattern is normal.  No radio-opaque calculi or other significant radiographic abnormality is seen. Surgical clips left mid abdomen and pelvis.  IMPRESSION: Negative.   Original Report Authenticated By: Rolla Flatten, M.D.     Basename 09/18/12 0615  WBC 14.7*  HGB 10.6*  HCT 32.0*  PLT 306    Basename 09/18/12 0615  NA 137  K 3.0*  CL 100  CO2 29  GLUCOSE 114*  BUN 16  CREATININE 0.72  CALCIUM 8.1*   CBG (last 3)  No results found for this basename: GLUCAP:3 in the last 72 hours  Wt Readings from Last 3 Encounters:  09/15/12 98.1 kg (216 lb 4.3 oz)  09/14/12 96.979 kg (213 lb 12.8 oz)  09/14/12 96.979 kg (213 lb 12.8 oz)    Physical Exam:   General: Alert and oriented x 3, no distress HEENT: Head is normocephalic, atraumatic, PERRLA, EOMI, sclera anicteric, oral mucosa pink and moist, dentition intact, ext ear canals clear,  Neck: Supple without JVD or lymphadenopathy Heart: Reg rate and rhythm. No murmurs rubs or gallops Chest: CTA bilaterally with decreased airway sounds today. Occ rhonchi. Abdomen: Soft, non-tender,  distended, bowel sounds high pitched.Jodell Cipro in place Extremities: No clubbing, cyanosis, or edema. Pulses are 2+ Skin: Clean and intact without signs of breakdown Neuro: Pt is cognitively appropriate with normal insight, memory, and awareness. Cranial nerves 2-12 are intact. Sensory exam is normal. Reflexes  are 2+ in all 4's. Fine motor coordination is intact. No tremors. Motor function is grossly 5/5.  Musculoskeletal: Full ROM, No pain with AROM or PROM in the neck, trunk, or extremities. Posture appropriate Psych: Pt's affect is appropriate. Pt is cooperative    Assessment/Plan: 1. Functional deficits secondary to deconditioning after AAA repair, multiple medical, which require 3+ hours per day of interdisciplinary therapy in a comprehensive inpatient rehab setting. Physiatrist is providing close team supervision and 24 hour management of active medical problems listed below. Physiatrist and rehab team continue to assess barriers to discharge/monitor patient progress toward functional and medical goals. FIM: FIM - Bathing Bathing Steps Patient Completed: Chest;Right Arm;Left Arm;Abdomen;Front perineal area;Buttocks;Left upper leg;Right upper leg Bathing: 4: Min-Patient completes 8-9 [id number] parts or 75+ percent  FIM - Upper Body Dressing/Undressing Upper body dressing/undressing steps patient completed: Thread/unthread right sleeve of pullover shirt/dresss;Thread/unthread left sleeve of pullover shirt/dress;Put head through opening of pull over shirt/dress;Pull shirt over trunk Upper body dressing/undressing: 5: Set-up assist to: Obtain clothing/put away FIM - Lower Body Dressing/Undressing Lower body dressing/undressing steps patient completed: Pull underwear up/down;Pull pants up/down Lower body dressing/undressing: 2: Max-Patient completed 25-49% of tasks  FIM - Toileting Toileting steps completed by patient: Adjust clothing prior to toileting;Performs perineal hygiene Toileting Assistive Devices: Grab bar or rail for support Toileting: 5: Supervision: Safety issues/verbal cues  FIM - TRadio producerDevices: BRecruitment consultantTransfers: 5-To toilet/BSC: Supervision (verbal cues/safety issues);5-From toilet/BSC: Supervision (verbal cues/safety  issues)  FIM - Control and instrumentation engineer Devices: Bed rails Bed/Chair Transfer: 5: Chair or W/C > Bed: Supervision (verbal cues/safety issues);5: Bed > Chair or W/C: Supervision (verbal cues/safety issues)  FIM - Locomotion: Wheelchair Locomotion: Wheelchair: 1: Total Assistance/staff pushes wheelchair (Pt<25%) (staff propels for transportation) FIM - Locomotion: Ambulation Locomotion: Ambulation Assistive Devices: Administrator Ambulation/Gait Assistance: 5: Supervision Locomotion: Ambulation: 5: Travels 150 ft or more with supervision/safety issues  Comprehension Comprehension Mode: Auditory Comprehension: 7-Follows complex conversation/direction: With no assist  Expression Expression Mode: Verbal Expression: 7-Expresses complex ideas: With no assist  Social Interaction Social Interaction: 7-Interacts appropriately with others - No medications needed.  Problem Solving Problem Solving: 6-Solves complex problems: With extra time  Memory Memory: 6-More than reasonable amt of time  Medical Problem List and Plan:  1. DVT Prophylaxis/Anticoagulation: Mechanical: Sequential compression devices, below knee Bilateral lower extremities  2. Pain Management: tylenol or tramadol for breakthrough pain. No significant pain on exam today  3. Mood: team to provide ego support as necessary. Pt is fairly positive at this point.  4. Neuropsych: This patient is capable of making decisions on his/her own behalf.  5. ABLA: check follow up labs 11/04. May add iron supplement if GI symptoms improve.  6. Hypokalemia: Will check follow up labs 11/04. Likely dilutional and due to ABLA.  7. Leucocytosis: Likely reactive. Monitor for fever. Monitor wound.  8. Post op ileus: continues on full liquid diet. He is having regular, semi-formed bm's, abdomen less distended  -reglan  -kub improved  -advanced diet to regular yesterday- had bm last night 9. Hypoxia: Pulmonary toilet with  IS. Continue nebs for now. Despite appearance pt states his breathing is steadily improving  -showing improvement 10. Fever-  ?consolidations on CXR  -ua equivocal, ucx negative, wbc's 14.7 yesterday  -empriric avelox started 11/2--  -recheck cxr and cbc tomorrow  -no further temps   -IS, OOB, FV  LOS (Days) 4 A FACE TO FACE EVALUATION WAS PERFORMED  Seraj Dunnam T 09/19/2012, 8:34 AM

## 2012-09-19 NOTE — Progress Notes (Signed)
Physical Therapy Session Note  Patient Details  Name: Brandon [name] MRN: [id number] Date of Birth: 1941/10/29  Today's Date: 09/19/2012 Time: 1191-4782 Time Calculation (min): 34 min  Short Term Goals: Week 1:   STGs= LTGs  Skilled Therapeutic Interventions/Progress Updates:    Session #1: This session focused on gait training with RW with supervision 150', cues for upright posture and to stay closer to RW (which will also help with upright posture).  Car transfer performed with supervision with RW, stairs with supervision bil rails.  Balance training in parallel bars with supervision: tandem stand bil multiple trials, feet together eyes open and eyes closed multiple trials, cone taps 2 x 10 reps each leg starting with bil hand support, one hand support and eventually no hand support and min assist by the end of the activity.  Pt's O2 sats decreased to mid 80s on 4 L O2 [place] and had to be bumped up to 6 L O2 [place] during seated rest breaks to help O2 recovery time.  DOE up to 3/4 with exertion.    Session #2: this session focused on reinforcing log roll for bed mobility to help protect abdominal incision mod I with bedrail (heavy reliance on bed rail for support), sit<-> stand from toilet modified independent, again, heavy reliance on grab bars to complete the task.  Standing at sink washing hands pt relying on stomach supported on sink for dynamic balance, but otherwise supervision for this task which requires reaching for soap, dials, and towels.  Gait to and from gym with RW supervision with no rest breaks until we got the to gym.  O2 sats only dropped to 90% on 4L O2  and he never had to be bumped up to 6L at all this session.  NuStep level 4 x 10 mins to work on muscular and cardiovascular endurance.    Therapy Documentation Precautions:  Precautions Precautions: Fall Precaution Comments: Monitor O2 sats with activity. Restrictions Weight Bearing Restrictions: No General:   Vital  Signs: Oxygen Therapy SpO2: 92 % O2 Device: Nasal cannula O2 Flow Rate (L/min): 4 L/min Pain: Pain Assessment Pain Assessment: 0-10 Pain Score:   5    See FIM for current functional status  Therapy/Group: Individual Therapy  Lurena Joiner B. Daveigh Batty, PT, DPT [phone]   09/19/2012, 3:10 PM

## 2012-09-19 NOTE — Evaluation (Signed)
Recreational Therapy Assessment and Plan  Patient Details  Name: Brandon [name] MRN: [id number] Date of Birth: May 27, 1941 Today's Date: 09/19/2012  Rehab Potential: Good ELOS: 10 days   Assessment Clinical Impression: Problem List:  Patient Active Problem List   Diagnosis   .  Tobacco abuse   .  Respiratory failure, post-operative   .  Syncope   .  AAA (abdominal aortic aneurysm, ruptured)   .  Hypertension   .  Hyperglycemia   .  DIC (disseminated intravascular coagulation)   .  Chronic obstructive pulmonary disease with bronchospasm    Past Medical History:  Past Medical History   Diagnosis  Date   .  Shortness of breath    .  COPD (chronic obstructive pulmonary disease)    .  Peripheral vascular disease     Past Surgical History:  Past Surgical History   Procedure  Date   .  Abdominal aortic aneurysm repair  09/08/2012     Procedure: ANEURYSM ABDOMINAL AORTIC REPAIR; Surgeon: Pryor Ochoa, MD; Location: [facility] OR; Service: Vascular; Laterality: N/A; open repair ruptured abdomenal aortic aneurysm   .  Ganglion cyst excision      Right wrist    Assessment & Plan  Clinical Impression: Brandon [name] is a 71 y.o. 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 He was taken to OR emergently for resection and grafting win insertion of aorto by common iliac graft by Dr. Hart Rochester on 09/09/12. Required 10 units PRBC, 6 units FFP and 2 units of platelets intraoperatively. Intubated through 10/28. Has required diuresis for fluid overload. Post op ileus resolving and diet slowly being advanced. Therapies ongoing and CIR recommended by therapy team. Patient transferred to CIR on 09/15/2012.   Pt presents with decreased activity tolerance, decreased functional mobility, decreased balance limiting pt's independence with leisure/community pursuits.   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[employer], watch wrestling, [employer] [address] [employer]) Other Leisure Interests: Television Leisure Participation Style: Alone;With Family/Friends Awareness of Community Resources: Good-identify 3 post discharge leisure resources Psychosocial / Spiritual Patient agreeable to Pet Therapy: Yes Does patient have pets?: No Social interaction - Mood/Behavior: Cooperative Film/video editor for Education?: Yes Patient Agreeable to Outing?: Yes Recreational Therapy Orientation Orientation -Reviewed with patient: Available activity resources Strengths/Weaknesses Patient Strengths/Abilities: Willingness to participate;Active premorbidly Patient weaknesses: Physical limitations  Plan Rec Therapy Plan Is patient appropriate for Therapeutic Recreation?: Yes Rehab Potential: Good Treatment times per week: Min 1 time per week >76minutes Estimated Length of Stay: 10 days TR Treatment/Interventions: Adaptive equipment instruction;1:1 session;Balance/vestibular training;Community reintegration;Patient/family education;Functional mobility training;Recreation/leisure participation;Therapeutic activities  Recommendations for other services: None  Discharge Criteria: Patient will be discharged from TR if patient refuses treatment 3 consecutive times without medical reason.  If treatment goals not met, if there is a change in medical status, if patient makes no progress towards goals or if patient is discharged from hospital.  The above assessment, treatment plan, treatment alternatives and goals were discussed and mutually agreed upon: by patient  Slevin Gunby 09/19/2012, 2:01 PM

## 2012-09-19 NOTE — Progress Notes (Signed)
Physical Therapy Session Note  Patient Details  Name: Brandon [name] MRN: [id number] Date of Birth: Jan 20, 1941  Today's Date: 09/19/2012 Time:  11:05-12:03 (53 min)    Skilled Therapeutic Interventions/Progress Updates:  Tx focused on dynamic balance, gait with RW and with no device for balance challenge, and Nustep for LE strengthening and activity tolerance. Pt with no c/o pain.   Gait training in controlled environment and on carpet with RW 3x150' and close S. Pt responds well with cues for posture and RW positioning, but unable to maintain independently.   Gait 3x50 with SPC, 2x25' with no device and Min A overall and up to Mod A for recovering LOB laterally x4. Without device, pt with very narrow BOS and LEs cross with turns. Balance improved with practice, but pt most appropriate with RW at this time. VSS with gait in all conditions >90% on 4L, seated rest breaks needed between trials. Educated pt on importance of establishing walking program after DC and discussed progression options.   All sit<>stand transfers x8 throughout tx with close S and safe hand placement noted.   Nustep level 5 with bil UE and LE x 12mn, averaging 45spm with goal of 13RPE. Pt not needing rest breaks with this task.      Therapy Documentation Precautions:  Precautions Precautions: Fall Precaution Comments: Monitor O2 sats with activity. Restrictions Weight Bearing Restrictions: No   Vital Signs: Therapy Vitals Pulse Rate: 85  Oxygen Therapy SpO2: 97 % O2 Device: Nasal cannula O2 Flow Rate (L/min): 4 L/min Pain: Pain Assessment Pain Assessment: No/denies pain Pain Score: 0-No pain Locomotion : Ambulation Ambulation/Gait Assistance: 5: Supervision;3: Mod assist (Mod A needed for no device)   See FIM for current functional status  Therapy/Group: Individual Therapy  Virl Cagey, PT 09/19/2012, 11:50 AM

## 2012-09-20 ENCOUNTER — Inpatient Hospital Stay (HOSPITAL_COMMUNITY): Payer: Medicare Other

## 2012-09-20 ENCOUNTER — Inpatient Hospital Stay (HOSPITAL_COMMUNITY): Payer: Medicare Other | Admitting: Physical Therapy

## 2012-09-20 ENCOUNTER — Inpatient Hospital Stay (HOSPITAL_COMMUNITY): Payer: Medicare Other | Admitting: Occupational Therapy

## 2012-09-20 DIAGNOSIS — K560 Paralytic ileus: Secondary | ICD-10-CM

## 2012-09-20 DIAGNOSIS — M79609 Pain in unspecified limb: Secondary | ICD-10-CM

## 2012-09-20 DIAGNOSIS — J449 Chronic obstructive pulmonary disease, unspecified: Secondary | ICD-10-CM

## 2012-09-20 DIAGNOSIS — R5381 Other malaise: Secondary | ICD-10-CM

## 2012-09-20 DIAGNOSIS — J4489 Other specified chronic obstructive pulmonary disease: Secondary | ICD-10-CM

## 2012-09-20 DIAGNOSIS — M7989 Other specified soft tissue disorders: Secondary | ICD-10-CM

## 2012-09-20 LAB — BASIC METABOLIC PANEL
BUN: 13 mg/dL (ref 6–23)
CO2: 28 mEq/L (ref 19–32)
Calcium: 8.2 mg/dL — ABNORMAL LOW (ref 8.4–10.5)
Chloride: 100 mEq/L (ref 96–112)
Creatinine, Ser: 0.66 mg/dL (ref 0.50–1.35)
GFR calc Af Amer: >90 mL/min (ref >90)
GFR calc non Af Amer: >90 mL/min (ref >90)
Glucose, Bld: 112 mg/dL — ABNORMAL HIGH (ref 70–99)
Potassium: 4.1 mEq/L (ref 3.5–5.1)
Sodium: 135 mEq/L (ref 135–145)

## 2012-09-20 LAB — CBC
HCT: 33.5 % — ABNORMAL LOW (ref 39.0–52.0)
Hemoglobin: 11.2 g/dL — ABNORMAL LOW (ref 13.0–17.0)
MCH: 31.4 pg (ref 26.0–34.0)
MCHC: 33.4 g/dL (ref 30.0–36.0)
MCV: 93.8 fL (ref 78.0–100.0)
Platelets: 361 10*3/uL (ref 150–400)
RBC: 3.57 MIL/uL — ABNORMAL LOW (ref 4.22–5.81)
RDW: 15.3 % (ref 11.5–15.5)
WBC: 15 10*3/uL — ABNORMAL HIGH (ref 4.0–10.5)

## 2012-09-20 NOTE — Progress Notes (Signed)
Subjective/Complaints: Bad night--burning, "hot poker" sensation right thigh all night. Breathing improving.  Good appetite  A 12 point review of systems has been performed and if not noted above is otherwise negative.   Objective: Vital Signs: Blood pressure 140/78, pulse 92, temperature 98.8 F (37.1 C), temperature source Oral, resp. rate 19, height '5\' 11"'$  (1.803 m), weight 98.1 kg (216 lb 4.3 oz), SpO2 100.00%. Dg Chest 2 View  09/20/2012  *RADIOLOGY REPORT*  Clinical Data: Shortness of breath, pneumonia  CHEST - 2 VIEW  Comparison: 09/16/2012  Findings: Bilateral pleural effusions persist with basilar atelectasis / consolidation.  Pleural effusions appear slightly larger.  Stable heart size with central vascular congestion.  No pneumothorax.  Trachea is midline.  IMPRESSION: Enlarging pleural effusions with slight worsening bibasilar atelectasis / consolidation.   Original Report Authenticated By: Jerilynn Mages. Annamaria Boots, M.D.     Basename 09/18/12 0615  WBC 14.7*  HGB 10.6*  HCT 32.0*  PLT 306    Basename 09/18/12 0615  NA 137  K 3.0*  CL 100  CO2 29  GLUCOSE 114*  BUN 16  CREATININE 0.72  CALCIUM 8.1*   CBG (last 3)  No results found for this basename: GLUCAP:3 in the last 72 hours  Wt Readings from Last 3 Encounters:  09/15/12 98.1 kg (216 lb 4.3 oz)  09/14/12 96.979 kg (213 lb 12.8 oz)  09/14/12 96.979 kg (213 lb 12.8 oz)    Physical Exam:   General: Alert and oriented x 3, no distress HEENT: Head is normocephalic, atraumatic, PERRLA, EOMI, sclera anicteric, oral mucosa pink and moist, dentition intact, ext ear canals clear,  Neck: Supple without JVD or lymphadenopathy Heart: Reg rate and rhythm. No murmurs rubs or gallops Chest: CTA bilaterally but decreased airway at bases.. Occ rhonchi. Abdomen: Soft, non-tender,  Hyperactive BS, non tender. Staples in place Extremities: No clubbing or.cyanosis. Minimal pretibial/pedal edema LLE.  Pulses are 2+ Skin: Clean and intact  without signs of breakdown Neuro: Pt is cognitively appropriate with normal insight, memory, and awareness. Cranial nerves 2-12 are intact. Sensory exam is normal. Reflexes are 2+ in all 4's. Fine motor coordination is intact. No tremors. Motor function is grossly 5/5.  Musculoskeletal: Full ROM, No pain with AROM or PROM in the neck, trunk, or extremities. Posture appropriate Psych: Pt's affect is appropriate. Pt is cooperative    Assessment/Plan: 1. Functional deficits secondary to deconditioning after AAA repair, multiple medical, which require 3+ hours per day of interdisciplinary therapy in a comprehensive inpatient rehab setting. Physiatrist is providing close team supervision and 24 hour management of active medical problems listed below. Physiatrist and rehab team continue to assess barriers to discharge/monitor patient progress toward functional and medical goals. FIM: FIM - Bathing Bathing Steps Patient Completed: Chest;Right Arm;Left Arm;Abdomen;Front perineal area;Buttocks;Right upper leg;Left upper leg;Right lower leg (including foot);Left lower leg (including foot) Bathing: 6: More than reasonable amount of time  FIM - Upper Body Dressing/Undressing Upper body dressing/undressing steps patient completed: Thread/unthread right sleeve of pullover shirt/dresss;Thread/unthread left sleeve of pullover shirt/dress;Put head through opening of pull over shirt/dress;Pull shirt over trunk Upper body dressing/undressing: 6: More than reasonable amount of time FIM - Lower Body Dressing/Undressing Lower body dressing/undressing steps patient completed: Thread/unthread right underwear leg;Thread/unthread left underwear leg;Pull underwear up/down;Thread/unthread right pants leg;Thread/unthread left pants leg;Pull pants up/down;Fasten/unfasten pants;Don/Doff right sock;Don/Doff left sock Lower body dressing/undressing: 6: More than reasonable amount of time  FIM - Toileting Toileting steps  completed by patient: Adjust clothing prior to toileting;Performs perineal hygiene;Adjust  clothing after toileting Toileting Assistive Devices: Grab bar or rail for support Toileting: 6: Assistive device: No helper  FIM - Radio producer Devices: Insurance account manager Transfers: 6-Assistive device: No helper  FIM - Control and instrumentation engineer Devices: Copy: 6: Supine > Sit: No assist;6: Bed > Chair or W/C: No assist;6: Chair or W/C > Bed: No assist  FIM - Locomotion: Wheelchair Locomotion: Wheelchair: 0: Activity did not occur FIM - Locomotion: Ambulation Locomotion: Ambulation Assistive Devices: Environmental consultant - Rolling;Other (comment) Ambulation/Gait Assistance: 5: Supervision Locomotion: Ambulation: 5: Travels 150 ft or more with supervision/safety issues  Comprehension Comprehension Mode: Auditory Comprehension: 7-Follows complex conversation/direction: With no assist  Expression Expression Mode: Verbal Expression: 7-Expresses complex ideas: With no assist  Social Interaction Social Interaction: 7-Interacts appropriately with others - No medications needed.  Problem Solving Problem Solving: 7-Solves complex problems: Recognizes & self-corrects  Memory Memory: 7-Complete Independence: No helper  Medical Problem List and Plan:  1. DVT Prophylaxis/Anticoagulation: Mechanical: Sequential compression devices, below knee Bilateral lower extremities  2. Pain Management: tylenol or tramadol for breakthrough pain. No significant pain on exam today  3. Mood: team to provide ego support as necessary. Pt is fairly positive at this point.  4. Neuropsych: This patient is capable of making decisions on his/her own behalf.  5. ABLA: improving slowly.  Recheck today pending.  6. Hypokalemia: follow up labs today pending.  7. Leucocytosis: Likely reactive. No fevers since this weekend and wound healing well.    -?consolidation on  CXR.  -on empiric avelox D#5  -await recheck CBC. 8. Post op ileus: resolved. Tolerating regular diet.   -wean reglan  -kub improved 9. Hypoxia: enlargement of pleural effusions. Will get decub view.  Decrease oxygen needs with therapy.  Encouraged  IS again. 10. Neuropathy RLE: nerve irritation from exercises yesterday---"was hard doing the bike yesterday". Gets better with activity. Will try kpad. Exam is unremarkable , mildly reduced Hip int/ext rotation but no pain with ROM   LOS (Days) 5 A FACE TO FACE EVALUATION WAS PERFORMED  Love, Ivan Anchors 09/20/2012, 9:10 AM

## 2012-09-20 NOTE — Progress Notes (Signed)
Recreational Therapy Session Note  Patient Details  Name: GARELD OBRECHT MRN: [id number] Date of Birth: [birth year] Today's Date: 09/20/2012 Time:  935-958 Pain: no c/o Skilled Therapeutic Interventions/Progress Updates: Pt stood to toss/catch a ball or kick a ball alternating feet working on activity tolerance & dynamic standing balance.  Pt then stood alternating between tossing/catching and kicking a ball with close supervision, min assist for LOB with fatigue.  Pt ambulated without assistive device for ball toss activity with min assist and mod assist for LOB x1.   Deylan Canterbury 09/20/2012, 5:29 PM

## 2012-09-20 NOTE — Patient Care Conference (Signed)
Inpatient RehabilitationTeam Conference Note Date: 09/19/2012   Time: 2:50 PM    Patient Name: Brandon [name]      Medical Record Number: [id number]  Date of Birth: 1941/03/28 Sex: Male         Room/Bed: 4151/4151-01 Payor Info: Payor: [place] [facility] [place] MEDICARE  Plan: BLUE MEDICARE [facility]  Product Type: *No Product type*     Admitting Diagnosis: aaa repair  Admit Date/Time:  09/15/2012  4:10 PM Admission Comments: No comment available   Primary Diagnosis:  Physical deconditioning Principal Problem: Physical deconditioning  Patient Active Problem List   Diagnosis Date Noted  . Physical deconditioning 09/19/2012  . Leucocytosis 09/19/2012  . Chronic obstructive pulmonary disease with bronchospasm 09/11/2012  . Respiratory failure, post-operative 09/09/2012  . Syncope 09/09/2012  . AAA (abdominal aortic aneurysm, ruptured) 09/09/2012  . Hypertension 09/09/2012  . Hyperglycemia 09/09/2012  . DIC (disseminated intravascular coagulation) 09/09/2012  . Tobacco abuse 09/08/2012    Expected Discharge Date: Expected Discharge Date: 09/22/12  Team Members Present: Physician: Dr. Alger Simons Social Worker Present: Lennart Pall, LCSW Nurse Present: Other (comment) Junius Creamer, RN) PT Present: Isabelle Course, PT OT Present: Forde Radon, OT;Roanna Epley, COTA;Jennifer Smith, OT Other (Discipline and Name): Daiva Nakayama, PPS Coordinator and Jena Gauss, RN     Current Status/Progress Goal Weekly Team Focus  Medical   AAA repair, resolving ileus, baseline COPD  maintain bowel function while advancing diet, wound care,   advancing diet, managing pain, increase exercise tolerance   Bowel/Bladder   continent of bowel and bladder         Swallow/Nutrition/ Hydration             ADL's   overall supervision  Mod I  activity endurance, energy conservation techniques, functional mobility   Mobility   supervision  mod I  activity tolerance   Communication               Safety/Cognition/ Behavioral Observations            Pain   no complaints of pain         Skin   midline sternal incision staples OTA. Healing scabbed over blister left to incision.   free of infection and skin breakdown  monitor skin qshift and assess incision    Rehab Goals Patient on target to meet rehab goals: Yes *See Interdisciplinary Assessment and Plan and progress notes for long and short-term goals  Barriers to Discharge: none at present    Possible Resolutions to Barriers:  n/a    Discharge Planning/Teaching Needs:  Plan home with wife able to provide intermittent assistance.  Siblings/ extended family to assist as needed.      Team Discussion:  Progressing very well and anticipate no concerns reaching mod i goals.  Expect will need to d/c home on O2.  Revisions to Treatment Plan:  None   Continued Need for Acute Rehabilitation Level of Care: The patient requires daily medical management by a physician with specialized training in physical medicine and rehabilitation for the following conditions: Daily direction of a multidisciplinary physical rehabilitation program to ensure safe treatment while eliciting the highest outcome that is of practical value to the patient.: Yes Daily medical management of patient stability for increased activity during participation in an intensive rehabilitation regime.: Yes Daily analysis of laboratory values and/or radiology reports with any subsequent need for medication adjustment of medical intervention for : Other;Post surgical problems;Pulmonary problems  Brandon [name] 09/20/2012, 9:52 AM

## 2012-09-20 NOTE — Progress Notes (Signed)
This note has been reviewed and this clinician agrees with information provided.  

## 2012-09-20 NOTE — Progress Notes (Signed)
*  Preliminary Results* Bilateral lower extremity venous duplex completed. Bilateral lower extremities are negative for deep vein thrombosis. There is no evidence of Baker's cyst bilaterally.  09/20/2012 3:25 PM Gertie Fey, RDMS, RDCS

## 2012-09-20 NOTE — Progress Notes (Signed)
Occupational Therapy Note  Patient Details  Name: CIN STUTZMAN MRN: [id number] Date of Birth: [birth year] Today's Date: 09/20/2012  Pt missed 30 mins skilled OT services secondary to preparing to go down for X-Ray.   Lavone Neri [facility] 09/20/2012, 2:22 PM

## 2012-09-20 NOTE — Progress Notes (Signed)
Physical Therapy Note  Patient Details  Name: ABDULHADI STOPA MRN: [id number] Date of Birth: [birth year] Today's Date: 09/20/2012  [phone] 60 minutes  No c/o pain.  Pt performed session on 4L O2 with sats >93% throughout.  Gait with RW controlled and household environments with supervision-mod I.  Pt with no LOB with RW.  Gait training controlled environment without AD with close supervision.  Dynamic gait training without AD for balance reactions and improved endurance with head turns, stop/start, direction changes.  Pt requires min A to correct occasional LOB to R during dynamic gait training.  Pt with improving activity tolerance, able to gait 150' without device.  Standing balance training with ball kick, alternating between bounce and kick with close supervision-min A for occasional LOB especially when fatigued.  Gait with ball toss with mod A to correct LOB.  Pt improving balance and activity tolerance.  Individual therapy   DONAWERTH,KAREN 09/20/2012, 11:24 AM

## 2012-09-20 NOTE — Progress Notes (Signed)
Occupational Therapy Session Note  Patient Details  Name: Brandon [name] MRN: [id number] Date of Birth: [birth year]  Today's Date: 09/20/2012 Time:  0730- 0830  60 minutes  Short Term Goals: Week 1:  OT Short Term Goal 1 (Week 1): Pt will be able to don pants with reacher. OT Short Term Goal 2 (Week 1): Pt will be able to don socks with sock aide. OT Short Term Goal 3 (Week 1): Pt will demonstrate improved activity tolerance to stand for 4-5 min without O2 levels decreasing.  Skilled Therapeutic Interventions/Progress Updates:    Self care retraining with bathing, grooming and dressing tasks focusing on bed mobility, increasing activity tolerance, taking rest breaks prn and deep breathing, energy conservation, and management of oxygen tubing during functional mobility and self care tasks. Pt performed supine to sit EOB rolling left to decrease stress on abdomen (surgical incision site) without using handrails. Performed bathing tasks at sink level per pt request due to not sleeping well and just returning from having scans done.  Pt requires increased time to complete self care routine and rest breaks due to endurance deficits and increased respiratory rate after minimal activity on 4L O2; Min verbal reminders to take breaks prn and to perform deep breathing exercises. Pt able to manage oxygen tubing during functional mobility with RW Mod I. Reviewed energy conservation strategies handout that was given yesterday and discussed ways to implement into home routine in prep for d/c.  Therapy Documentation Precautions:  Precautions Precautions: Fall Precaution Comments: Monitor O2 sats with activity. Restrictions Weight Bearing Restrictions: No  Pain: Discomfort in R thigh from "laying in this bed"; relieved after getting up and some activity See FIM for current functional status  Therapy/Group: Individual Therapy  Jackelyn Poling 09/20/2012, 8:09 AM

## 2012-09-20 NOTE — Progress Notes (Signed)
Physical Therapy Session Note  Patient Details  Name: Brandon [name] MRN: [id number] Date of Birth: 03-Mar-1941  Today's Date: 09/20/2012 Time: 9562-1308 Time Calculation (min): 43 min  S:  Pt reports that he has had a lot of testing this morning and that he was tired.  Pt indicating that he had new DVTs, checked with RN who reported that pt was to have dopplers after therapy today, but per family request, not because of concern.  Pt agreed to participate as much as able in therapy.  Skilled Therapeutic Interventions/Progress Updates:    Pt performed gait on unit with RW x 150' x 2 with supervision.  10 steps with 2 rails with supervision.  Transfer into and out of bed with supervision.  Tolieting with supervision.  O2 sats checked during session and above 92% on 4L O2.  Therapy Documentation Precautions:  Precautions Precautions: Fall Precaution Comments: Monitor O2 sats with activity. Restrictions Weight Bearing Restrictions: No  See FIM for current functional status  Therapy/Group: Individual Therapy  Georges Mouse 09/20/2012, 2:10 PM

## 2012-09-20 NOTE — Progress Notes (Signed)
Social Work Patient ID: Brandon [name], male   DOB: 1941-07-28, 71 y.o.   MRN: [id number]  Met yesterday afternoon with patient to review team conference - pt very agreeable with targeted d/c 11/8 at modified independent goals.  Aware he will need to d/c home with O2.  Will arrange HH f/u and DME.  Andriea Hasegawa

## 2012-09-21 ENCOUNTER — Inpatient Hospital Stay (HOSPITAL_COMMUNITY): Payer: Medicare Other | Admitting: *Deleted

## 2012-09-21 ENCOUNTER — Inpatient Hospital Stay (HOSPITAL_COMMUNITY): Payer: Medicare Other | Admitting: Occupational Therapy

## 2012-09-21 ENCOUNTER — Inpatient Hospital Stay (HOSPITAL_COMMUNITY): Payer: Medicare Other | Admitting: Physical Therapy

## 2012-09-21 DIAGNOSIS — J9600 Acute respiratory failure, unspecified whether with hypoxia or hypercapnia: Secondary | ICD-10-CM

## 2012-09-21 DIAGNOSIS — R918 Other nonspecific abnormal finding of lung field: Secondary | ICD-10-CM

## 2012-09-21 DIAGNOSIS — K560 Paralytic ileus: Secondary | ICD-10-CM

## 2012-09-21 DIAGNOSIS — R5381 Other malaise: Secondary | ICD-10-CM

## 2012-09-21 DIAGNOSIS — J9601 Acute respiratory failure with hypoxia: Secondary | ICD-10-CM

## 2012-09-21 DIAGNOSIS — J4489 Other specified chronic obstructive pulmonary disease: Secondary | ICD-10-CM

## 2012-09-21 DIAGNOSIS — J449 Chronic obstructive pulmonary disease, unspecified: Secondary | ICD-10-CM

## 2012-09-21 DIAGNOSIS — J90 Pleural effusion, not elsewhere classified: Secondary | ICD-10-CM

## 2012-09-21 LAB — EXPECTORATED SPUTUM ASSESSMENT W GRAM STAIN, RFLX TO RESP C

## 2012-09-21 LAB — PRO B NATRIURETIC PEPTIDE: Pro B Natriuretic peptide (BNP): 338.7 pg/mL — ABNORMAL HIGH (ref 0–125)

## 2012-09-21 MED ORDER — ALBUTEROL SULFATE (5 MG/ML) 0.5% IN NEBU
2.5000 mg | INHALATION_SOLUTION | Freq: Three times a day (TID) | RESPIRATORY_TRACT | Status: DC
  Administered 2012-09-21 – 2012-09-22 (×2): 2.5 mg via RESPIRATORY_TRACT
  Filled 2012-09-21 (×2): qty 0.5

## 2012-09-21 MED ORDER — ALBUTEROL SULFATE (5 MG/ML) 0.5% IN NEBU: 2.5000 mg | INHALATION_SOLUTION | RESPIRATORY_TRACT | Status: DC | PRN

## 2012-09-21 MED ORDER — FUROSEMIDE 10 MG/ML IJ SOLN
40.0000 mg | Freq: Once | INTRAMUSCULAR | Status: AC
  Administered 2012-09-21: 40 mg via INTRAVENOUS
  Filled 2012-09-21: qty 4

## 2012-09-21 MED ORDER — IPRATROPIUM BROMIDE 0.02 % IN SOLN
0.5000 mg | Freq: Three times a day (TID) | RESPIRATORY_TRACT | Status: DC
  Administered 2012-09-21 – 2012-09-22 (×2): 0.5 mg via RESPIRATORY_TRACT
  Filled 2012-09-21 (×2): qty 2.5

## 2012-09-21 MED ORDER — POTASSIUM CHLORIDE CRYS ER 20 MEQ PO TBCR
20.0000 meq | EXTENDED_RELEASE_TABLET | Freq: Once | ORAL | Status: AC
  Administered 2012-09-21: 20 meq via ORAL
  Filled 2012-09-21: qty 1

## 2012-09-21 NOTE — Progress Notes (Signed)
Note reviewed prior to cosign.   

## 2012-09-21 NOTE — Progress Notes (Signed)
Occupational Therapy Discharge Summary  Patient Details  Name: Brandon [name] MRN: [id number] Date of Birth: 10/02/1941  Today's Date: 09/21/2012  Patient has met 8 of 8 long term goals due to improved activity tolerance, improved balance and ability to compensate for deficits.  Patient to discharge at overall Modified Independent level for ADLs and functional mobility with RW.  Provided education on energy conservation techniques during self care routine. Patient's care partner (wife and family) is independent to provide the necessary assistance at discharge with IADL tasks prn.    Reasons goals not met: NA  Recommendation:  No further skilled OT recommended  Equipment: Pt has access to shower chair  Reasons for discharge: treatment goals met  Patient/family agrees with progress made and goals achieved: Yes  OT Discharge Precautions/Restrictions  Precautions Precautions: Fall Precaution Comments: surgical incision site midline of abdomen with staples Restrictions Weight Bearing Restrictions: No Vital Signs Therapy Vitals Temp: 98.4 F (36.9 C) Temp src: Oral Pulse Rate: 95  Resp: 18  BP: 121/68 mmHg Oxygen Therapy SpO2: 98 % O2 Device: Nasal cannula O2 Flow Rate (L/min): 4 L/min Pulse Oximetry Type: Intermittent Pain  No c/o pain ADL  See FIM Vision/Perception  Vision - History Baseline Vision: Wears glasses all the time Patient Visual Report: No change from baseline Perception Perception: Within Functional Limits Praxis Praxis: Intact  Cognition Overall Cognitive Status: Appears within functional limits for tasks assessed Arousal/Alertness: Awake/alert Orientation Level: Oriented X4 Sensation Sensation Light Touch: Appears Intact Proprioception: Appears Intact Coordination Gross Motor Movements are Fluid and Coordinated: Yes Fine Motor Movements are Fluid and Coordinated: Yes Motor  Motor Motor: Within Functional Limits Mobility  Bed  Mobility Rolling Left: 6: Modified independent (Device/Increase time) Left Sidelying to Sit: 6: Modified independent (Device/Increase time) Supine to Sit: 6: Modified independent (Device/Increase time) Transfers Sit to Stand: 6: Modified independent (Device/Increase time) Stand to Sit: 6: Modified independent (Device/Increase time)  Trunk/Postural Assessment  Cervical Assessment Cervical Assessment: Within Functional Limits Thoracic Assessment Thoracic Assessment: Within Functional Limits Lumbar Assessment Lumbar Assessment: Within Functional Limits Postural Control Postural Control: Within Functional Limits  Balance Balance Balance Assessed: Yes Static Sitting Balance Static Sitting - Balance Support: Feet supported Static Sitting - Level of Assistance: 7: Independent Dynamic Sitting Balance Dynamic Sitting - Balance Support: During functional activity Dynamic Sitting - Level of Assistance: 7: Independent Static Standing Balance Static Standing - Balance Support: During functional activity Static Standing - Level of Assistance: 6: Modified independent (Device/Increase time) Dynamic Standing Balance Dynamic Standing - Level of Assistance: 6: Modified independent (Device/Increase time) Extremity/Trunk Assessment RUE Assessment RUE Assessment: Within Functional Limits LUE Assessment LUE Assessment: Within Functional Limits  See FIM for current functional status  Jackelyn Poling 09/21/2012, 7:36 AM

## 2012-09-21 NOTE — Progress Notes (Signed)
Physical Therapy Note  Patient Details  Name: Brandon [name] MRN: [id number] Date of Birth: 06-20-1941 Today's Date: 09/21/2012  Time 1: 900-957 57 minutes  1:1 No c/o pain.  Pt treatment performed on 3L O2 [place], pt 86% with activity, quickly resolved to >90% with seated rest.  Gait training controlled and household environments though obstacles with RW mod I. Stair training mod I x 1 flight.  Balance training tap ups anterior/posterior/laterally with close supervision-min A when fatigued without AD.  Step ups to curb step with RW with supervision.  Pt with improving balance reactions and activity tolerance.  Time 2: 1350-1430 40 minutes  1:1 No c/o pain.  Treatment focused on gait and activity tolerance walking >1000' on outdoor, uneven surfaces with RW with supervision.  Pt able to negotiate community and outdoor environments with infrequent rests with 3 L O2 spO2 >90%.  [name] 09/21/2012, 9:58 AM

## 2012-09-21 NOTE — Progress Notes (Signed)
Patient ID: Brandon [name], male   DOB: June 30, 1941, 71 y.o.   MRN: [id number] Subjective/Complaints: Bad night--burning, "hot poker" sensation right thigh all night. Breathing improving.  Good appetite  A 12 point review of systems has been performed and if not noted above is otherwise negative.   Objective: Vital Signs: Blood pressure 121/68, pulse 95, temperature 98.4 F (36.9 C), temperature source Oral, resp. rate 18, height 5\' 11"  (1.803 m), weight 97.8 kg (215 lb 9.8 oz), SpO2 98.00%. Dg Chest 2 View  09/20/2012  *RADIOLOGY REPORT*  Clinical Data: Shortness of breath, pneumonia  CHEST - 2 VIEW  Comparison: 09/16/2012  Findings: Bilateral pleural effusions persist with basilar atelectasis / consolidation.  Pleural effusions appear slightly larger.  Stable heart size with central vascular congestion.  No pneumothorax.  Trachea is midline.  IMPRESSION: Enlarging pleural effusions with slight worsening bibasilar atelectasis / consolidation.   Original Report Authenticated By: Judie Petit. Miles Costain, M.D.    Ct Chest Wo Contrast  09/20/2012  *RADIOLOGY REPORT*  Clinical Data: Short of breath, effusions, concern for pneumonia  CT CHEST WITHOUT CONTRAST  Technique:  Multidetector CT imaging of the chest was performed following the standard protocol without IV contrast.  Comparison: CT abdomen 09/08/2012  Findings: There are bilateral pleural effusions and associated passive atelectasis.  The effusions or small to moderate.  There is a focus of ground-glass opacity in the right middle lobe (image 30).  There is some branching nodular air space disease in the right lower lobe (image 33).  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  Exam somewhat degraded by patient respiratory motion. The central airways are normal.  No axillary or supraclavicular lymphadenopathy.  Trace pericardial effusion is noted   Coronary calcifications are present.  Limited view of the upper abdomen is unremarkable.  Limited view of the  skeleton is unremarkable.  IMPRESSION:  1.  Moderate bilateral pleural effusions with passive atelectasis. 2.  Branching air space disease in the right lower lobe and to a lesser degree in the right middle lobe concerning for infection versus less likely aspiration.   Original Report Authenticated By: Genevive Bi, M.D.    Dg Chest Right Decubitus  09/20/2012  *RADIOLOGY REPORT*  Clinical Data: Pleural effusion  CHEST - RIGHT DECUBITUS  Comparison: 09/20/2012  Findings: Right side down decubitus view does not demonstrate a large free flowing right effusion.  Compared to the chest x-ray earlier today the right effusion may be subpulmonic and loculated. If there is further concern, consider chest CT for better evaluation.  No pneumothorax evident.  IMPRESSION: No large free flowing right effusion dependently by decubitus imaging.  Suspect right effusion could be loculated versus subpulmonic.   Original Report Authenticated By: Judie Petit. Miles Costain, M.D.     Basename 09/20/12 0851  WBC 15.0*  HGB 11.2*  HCT 33.5*  PLT 361    Basename 09/20/12 0851  NA 135  K 4.1  CL 100  CO2 28  GLUCOSE 112*  BUN 13  CREATININE 0.66  CALCIUM 8.2*   CBG (last 3)  No results found for this basename: GLUCAP:3 in the last 72 hours  Wt Readings from Last 3 Encounters:  09/20/12 97.8 kg (215 lb 9.8 oz)  09/14/12 96.979 kg (213 lb 12.8 oz)  09/14/12 96.979 kg (213 lb 12.8 oz)    Physical Exam:   General: Alert and oriented x 3, no distress HEENT: Head is normocephalic, atraumatic, PERRLA, EOMI, sclera anicteric, oral mucosa pink and moist, dentition intact, ext ear canals  clear,  Neck: Supple without JVD or lymphadenopathy Heart: Reg rate and rhythm. No murmurs rubs or gallops Chest: CTA bilaterally but decreased airway at bases.. Occ rhonchi. Abdomen: Soft, non-tender,  Hyperactive BS, non tender. Staples in place Extremities: No clubbing or.cyanosis. Minimal pretibial/pedal edema LLE.  Pulses are 2+ Skin:  Clean and intact without signs of breakdown Neuro: Pt is cognitively appropriate with normal insight, memory, and awareness. Cranial nerves 2-12 are intact. Sensory exam is normal. Reflexes are 2+ in all 4's. Fine motor coordination is intact. No tremors. Motor function is grossly 5/5.  Musculoskeletal: Full ROM, No pain with AROM or PROM in the neck, trunk, or extremities. Posture appropriate Psych: Pt's affect is appropriate. Pt is cooperative    Assessment/Plan: 1. Functional deficits secondary to deconditioning after AAA repair, multiple medical, which require 3+ hours per day of interdisciplinary therapy in a comprehensive inpatient rehab setting. Physiatrist is providing close team supervision and 24 hour management of active medical problems listed below. Physiatrist and rehab team continue to assess barriers to discharge/monitor patient progress toward functional and medical goals. FIM: FIM - Bathing Bathing Steps Patient Completed: Chest;Right Arm;Left lower leg (including foot);Left Arm;Abdomen;Front perineal area;Buttocks;Right upper leg;Left upper leg;Right lower leg (including foot) Bathing: 6: Assistive device (Comment)  FIM - Upper Body Dressing/Undressing Upper body dressing/undressing steps patient completed: Thread/unthread right sleeve of pullover shirt/dresss;Thread/unthread left sleeve of pullover shirt/dress;Put head through opening of pull over shirt/dress;Pull shirt over trunk Upper body dressing/undressing: 6: Assistive device (Comment) FIM - Lower Body Dressing/Undressing Lower body dressing/undressing steps patient completed: Thread/unthread right underwear leg;Thread/unthread left underwear leg;Pull underwear up/down;Thread/unthread right pants leg;Thread/unthread left pants leg;Pull pants up/down;Fasten/unfasten pants;Don/Doff right shoe;Don/Doff left shoe;Fasten/unfasten right shoe;Fasten/unfasten left shoe;Don/Doff right sock;Don/Doff left sock Lower body  dressing/undressing: 6: Assistive device (Comment)  FIM - Toileting Toileting steps completed by patient: Adjust clothing prior to toileting;Performs perineal hygiene;Adjust clothing after toileting Toileting Assistive Devices: Grab bar or rail for support Toileting: 6: Assistive device: No helper  FIM - Diplomatic Services operational officer Devices: Art gallery manager Transfers: 6-Assistive device: No helper  FIM - Banker Devices: Therapist, occupational: 6: Supine > Sit: No assist;6: Sit > Supine: No assist;6: Bed > Chair or W/C: No assist;6: Chair or W/C > Bed: No assist  FIM - Locomotion: Wheelchair Locomotion: Wheelchair: 0: Activity did not occur FIM - Locomotion: Ambulation Locomotion: Ambulation Assistive Devices: Designer, industrial/product Ambulation/Gait Assistance: 5: Supervision Locomotion: Ambulation: 5: Travels 150 ft or more with supervision/safety issues  Comprehension Comprehension Mode: Auditory Comprehension: 7-Follows complex conversation/direction: With no assist  Expression Expression Mode: Verbal Expression: 7-Expresses complex ideas: With no assist  Social Interaction Social Interaction: 7-Interacts appropriately with others - No medications needed.  Problem Solving Problem Solving: 7-Solves complex problems: Recognizes & self-corrects  Memory Memory: 7-Complete Independence: No helper  Medical Problem List and Plan:  1. DVT Prophylaxis/Anticoagulation: Mechanical: Sequential compression devices, below knee Bilateral lower extremities  2. Pain Management: tylenol or tramadol for breakthrough pain. No significant pain on exam today  3. Mood: team to provide ego support as necessary. Pt is fairly positive at this point.  4. Neuropsych: This patient is capable of making decisions on his/her own behalf.  5. ABLA: improving slowly.  Recheck today pending.  6. Hypokalemia: follow up labs today pending.  7. Leucocytosis:  Likely reactive. No fevers since this weekend and wound healing well.    -?consolidation on CXR.CT RLL and RML airspace disease ? Infection, will ask pulmonary  to evaluate  -on empiric avelox D#5  -await recheck CBC. 8. Post op ileus: resolved. Tolerating regular diet.   -wean reglan  -kub improved 9. Hypoxia: enlargement of pleural effusions. Will get decub view.  Decrease oxygen needs with therapy.  Encouraged  IS again. 10. Neuropathy RLE: nerve irritation from exercises yesterday---"was hard doing the bike yesterday". Gets better with activity. Will try kpad. Exam is unremarkable , mildly reduced Hip int/ext rotation but no pain with ROM   LOS (Days) 6 A FACE TO FACE EVALUATION WAS PERFORMED  Tamera Pingley E 09/21/2012, 9:39 AM

## 2012-09-21 NOTE — Progress Notes (Signed)
Occupational Therapy Session Note  Patient Details  Name: Brandon [name] MRN: [id number] Date of Birth: 02-06-1941  Today's Date: 09/21/2012 Time:  0800- 0900    Short Term Goals: Week 1:  OT Short Term Goal 1 (Week 1): Pt will be able to don pants with reacher. OT Short Term Goal 2 (Week 1): Pt will be able to don socks with sock aide. OT Short Term Goal 3 (Week 1): Pt will demonstrate improved activity tolerance to stand for 4-5 min without O2 levels decreasing.  Skilled Therapeutic Interventions/Progress Updates:    Self care retraining with bathing, dressing, and grooming tasks focusing on pt and family (pt's sister) education, activity tolerance, and functional mobility with RW in prep for d/c home. Discussed energy conservation techniques for self care routine at home and using chair/shower chair to sit during activity when possible and taking breaks prn. Ambulated to BR with RW and around room to gather clothing items Mod I and no balance concerns. Pt required one verbal reminder to keep walker close to him (pt was going to "park" walker and then walk without it to the shower). Discussed reason to use RW during functional ambulation and pt verbalized understanding. Pt on 4L O2 during entire session and SpO2 % stayed above 92.  Therapy Documentation Precautions:  Precautions Precautions: Fall Precaution Comments: surgical incision site midline of abdomen with staples Restrictions Weight Bearing Restrictions: No Vital Signs: Therapy Vitals Temp: 98.4 F (36.9 C) Temp src: Oral Pulse Rate: 95  Resp: 18  BP: 121/68 mmHg Oxygen Therapy SpO2: 98 % O2 Device: Nasal cannula O2 Flow Rate (L/min): 4 L/min Pulse Oximetry Type: Intermittent Pain: Pain Assessment Pain Assessment: No/denies pain   See FIM for current functional status  Therapy/Group: Individual Therapy  Jackelyn Poling 09/21/2012, 8:38 AM

## 2012-09-21 NOTE — Progress Notes (Signed)
Recreational Therapy Discharge Summary Patient Details  Name: VINEETH FELL MRN: [id number] Date of Birth: 1941-02-05 Today's Date: 09/21/2012  Comments on progress toward goals: Pt has made great progress toward goal and is ready for discharge home.  Pt requires frequent rest breaks in which he initiates independently.  Pt is mostly limited by activity tolerance. Reasons for discharge: discharge from  hospital  Patient/family agrees with progress made and goals achieved: Yes  Kinnick Maus 09/21/2012, 4:27 PM

## 2012-09-21 NOTE — Progress Notes (Signed)
Physical Therapy Discharge Summary  Patient Details  Name: Brandon [name] MRN: [id number] Date of Birth: 09-27-1941  Today's Date: 09/21/2012  Patient has met 6 of 6 long term goals due to improved activity tolerance, improved balance and increased strength.  Patient to discharge at an ambulatory level Modified Independent.   Pt able to gait and perform all mobility with mod I with RW on 3L O2 [place].  Reasons goals not met: n/a  Recommendation:  Patient will benefit from ongoing skilled PT services in home health setting to continue to advance safe functional mobility, address ongoing impairments in activity tolerance, balance, gait, and minimize fall risk.  Equipment: RW  Reasons for discharge: treatment goals met and discharge from hospital  Patient/family agrees with progress made and goals achieved: Yes  PT Discharge Cognition Overall Cognitive Status: Appears within functional limits for tasks assessed Sensation Sensation Light Touch: Appears Intact Proprioception: Appears Intact Coordination Gross Motor Movements are Fluid and Coordinated: Yes Motor  Motor Motor: Within Functional Limits   Trunk/Postural Assessment  Cervical Assessment Cervical Assessment: Within Functional Limits Thoracic Assessment Thoracic Assessment: Within Functional Limits Lumbar Assessment Lumbar Assessment: Within Functional Limits Postural Control Postural Control: Within Functional Limits  Balance Static Standing Balance Static Standing - Level of Assistance: 6: Modified independent (Device/Increase time) Dynamic Standing Balance Dynamic Standing - Level of Assistance: 6: Modified independent (Device/Increase time) Extremity Assessment      RLE Assessment RLE Assessment: Within Functional Limits LLE Assessment LLE Assessment: Within Functional Limits  See FIM for current functional status  [name] 09/21/2012, 3:58 PM

## 2012-09-21 NOTE — Progress Notes (Signed)
Occupational Therapy Session Note  Patient Details  Name: Brandon [name] MRN: [id number] Date of Birth: [birth year]  Today's Date: 09/21/2012 Time: 1300-1330 Time Calculation (min): 30 min  Short Term Goals: Week 1:  OT Short Term Goal 1 (Week 1): Pt will be able to don pants with reacher. OT Short Term Goal 2 (Week 1): Pt will be able to don socks with sock aide. OT Short Term Goal 3 (Week 1): Pt will demonstrate improved activity tolerance to stand for 4-5 min without O2 levels decreasing.  Skilled Therapeutic Interventions/Progress Updates:    Self care retraining with focus on functional mobility with RW, dynamic balance, and activity tolerance during simple meal prep activity. Ambulated with RW to and from ADL kitchen; Negotiated kitchen environment with RW and managed O2 tubing Mod I while retrieving and transferring needed items. Trialed pt on 2L of O2 with SpO2 remaining at 92% and above during and at end of activity. Replaced pt on room O2 at 3L at end of session.  Therapy Documentation Precautions:  Precautions Precautions: Fall Precaution Comments: surgical incision site midline of abdomen with staples Restrictions Weight Bearing Restrictions: No Pain:  No c/o pain See FIM for current functional status  Therapy/Group: Individual Therapy  Jackelyn Poling 09/21/2012, 3:11 PM

## 2012-09-21 NOTE — Progress Notes (Signed)
Recreational Therapy Session Note  Patient Details  Name: Brandon [name] MRN: [id number] Date of Birth: 05/16/1941 Today's Date: 09/21/2012 Time:  1350-1430 Pain: no c/o Skilled Therapeutic Interventions/Progress Updates: session started late due to IV team in to place IV.  Session focused on activity tolerance and community ambulation using RW.  Pt required 2 seated restbreaks.  Pt ambulated on indoor & outdoor surfaces with supervision.  Therapy/Group: Co-Treatment Antwan Bribiesca 09/21/2012, 4:14 PM

## 2012-09-21 NOTE — Consult Note (Signed)
Name: Brandon [name] MRN: [id number] DOB: 24-Dec-1940    LOS: 6  [facility]   History of Present Illness: 71 y/o M, smoker,  with PMH of PVD, COPD s/p emergent abdominal aortic aneurysm repair 09/08/12.  Hospital course complicated by blood loss anemia and ileus.  Transferred to [facility] 11/1 for further PT/Rehab efforts. 11/7 PCCM consulted for questionable consolidation on CXR concerning for PNA / Effusion.     Lines / Drains:   Cultures: 11/2 UC>>>insignificant growth  Antibiotics: Avelox 11/2>>>  Tests / Events: 10/26 ECHO>>>Left ventricle: The cavity size was normal. Septal wall thickness was mildly increased. Systolic function wasnormal. The estimated ejection fraction was in the range of 55% to 60%. Mildto moderate hypokinesis of the basal inferior myocardium.Atrial septum: No defect or patent foramen ovale was identified.  11/6 CT Chest>>>Moderate bilateral pleural effusions with passive atelectasis.  Branching air space disease in the right lower lobe and to a lesser degree in the right middle lobe concerning for infection versus less likely aspiration.    Past Medical History  Diagnosis Date  . Shortness of breath   . COPD (chronic obstructive pulmonary disease)   . Peripheral vascular disease     Past Surgical History  Procedure Date  . Abdominal aortic aneurysm repair 09/08/2012    Procedure: ANEURYSM ABDOMINAL AORTIC REPAIR;  Surgeon: Pryor Ochoa, MD;  Location: [facility] OR;  Service: Vascular;  Laterality: N/A;  open repair ruptured abdomenal aortic aneurysm  . Ganglion cyst excision     Right wrist    Prior to Admission medications   Medication Sig Start Date End Date Taking? Authorizing Provider  albuterol (PROVENTIL) (5 MG/ML) 0.5% nebulizer solution Take 0.5 mLs (2.5 mg total) by nebulization every 3 (three) hours as needed for wheezing or shortness of breath. 09/15/12  Yes Samantha J Rhyne, PA  albuterol (PROVENTIL) (5  MG/ML) 0.5% nebulizer solution Take 0.5 mLs (2.5 mg total) by nebulization every 6 (six) hours. 09/15/12  Yes Samantha J Rhyne, PA  ibuprofen (ADVIL,MOTRIN) 200 MG tablet Take 400 mg by mouth every morning.   Yes Historical Provider, MD  metoprolol tartrate (LOPRESSOR) 25 MG tablet Take 1 tablet (25 mg total) by mouth 2 (two) times daily. 09/15/12  Yes Samantha J Rhyne, PA  oxyCODONE (ROXICODONE) 5 MG immediate release tablet Take 1 tablet (5 mg total) by mouth every 6 (six) hours as needed for pain. 09/15/12  Yes Dara Lords, PA    Allergies No Known Allergies  Family History Family History  Problem Relation Age of Onset  . Dementia Mother   . Cancer Father     Social History Quit smoking on admit.  Smoked approx 40 years 1-1 1/2 ppd.  Works as a Administrator - wears mask at times.  Lot of dust exposure.    Review Of Systems:  Gen: Denies fever, chills, weight change, fatigue, night sweats HEENT: Denies blurred vision, double vision, hearing loss, tinnitus, sinus congestion, rhinorrhea, sore throat, neck stiffness, dysphagia PULM: Denies hemoptysis, wheezing.  Reports cough with light yellow sputum production, shortness of breath with activity.  CV: Denies chest pain, orthopnea, paroxysmal nocturnal dyspnea, palpitations.  Reports lower extremity swelling.  GI: Denies abdominal pain, nausea, vomiting, diarrhea, hematochezia, melena, constipation, change in bowel habits GU: Denies dysuria, hematuria, polyuria, oliguria, urethral discharge Endocrine: Denies hot or cold intolerance, polyuria, polyphagia or appetite change Derm: Denies rash, dry skin, scaling or peeling skin change Heme: Denies easy bruising, bleeding, bleeding  gums Neuro: Denies headache, numbness, weakness, slurred speech, loss of memory or consciousness  Vital Signs: Temp:  [98.4 F (36.9 C)-98.6 F (37 C)] 98.4 F (36.9 C) (11/07 0453) Pulse Rate:  [81-96] 95  (11/07 0453) Resp:  [18] 18  (11/07 0453) BP:  (121-143)/(68-80) 121/68 mmHg (11/07 0453) SpO2:  [95 %-98 %] 98 % (11/07 0453) Weight:  [215 lb 9.8 oz (97.8 kg)] 215 lb 9.8 oz (97.8 kg) (11/06 1129) I/O last 3 completed shifts: In: 360 [P.O.:360] Out: -   Physical Examination: General: wdwn adult male in NAD Neuro: AAOx4, speech clear, MAE CV: s1s2 rrr, no m/r/g, no jvd PULM: resp's even/non-labored on 4L / [place], lungs bilaterally clear anterior, diminished posterior-lateral GI: round/soft, bsx4 active, tol PO's Extremities: warm/dry, compression stockings on BLE, 1-2+ pitting edema in ankles / feet  Labs    CBC  Lab 09/20/12 0851 09/18/12 0615  HGB 11.2* 10.6*  HCT 33.5* 32.0*  WBC 15.0* 14.7*  PLT 361 306   BMET  Lab 09/20/12 0851 09/18/12 0615  NA 135 137  K 4.1 3.0*  CL 100 100  CO2 28 29  GLUCOSE 112* 114*  BUN 13 16  CREATININE 0.66 0.72  CALCIUM 8.2* 8.1*  MG -- --  PHOS -- --    Radiology: 11/6 CXR (R Decubitus)>>>No large free flowing right effusion dependently by decubitus imaging. Suspect right effusion could be loculated versus subpulmonic.     Assessment and Plan: Principal Problem:  *Physical deconditioning Active Problems:  AAA (abdominal aortic aneurysm, ruptured)  Chronic obstructive pulmonary disease with bronchospasm  Leucocytosis   Bilateral Pleural Effusion Compressive Atelectasis Ground Glass Opacity in RUL Assessment: 71 y/o heavy smoker s/p emergent AAA repair.  Noted increased dyspnea with rehab activities.  CT evaluation demonstrates moderate bilateral effusions with bibasilar atelectasis.     Plan: See below     Canary Brim, NP-C [facility] Pgr: [phone]  09/21/2012, 10:21 AM    PCCM Attending: I have interviewed and examined the patient and reviewed the database. I have formulated the assessment and plan as reflected in the note above with amendments made by me. The CXR findings could represent an infectious process but the bilateral nature of  the effusions suggest a component of CHF/volume overload. He does not appear to have an untreated infectious process (ie. Not toxic appearing). Although he has been a heavy smoker, there is no evidence of reversible airflow obstruction.   REC: proBNP now Lasix X 1 dose Recheck CXR AM 11/08 looking for clearing of infiltrates and effusions Complete 7 d course of Moxifloxacin  Billy Fischer, MD;  PCCM service; Mobile [phone]

## 2012-09-22 ENCOUNTER — Telehealth: Payer: Self-pay

## 2012-09-22 ENCOUNTER — Inpatient Hospital Stay (HOSPITAL_COMMUNITY): Payer: Medicare Other

## 2012-09-22 MED ORDER — FLUTICASONE-SALMETEROL 250-50 MCG/DOSE IN AEPB
1.0000 | INHALATION_SPRAY | Freq: Two times a day (BID) | RESPIRATORY_TRACT | Status: DC
  Filled 2012-09-22: qty 14

## 2012-09-22 MED ORDER — FLUTICASONE-SALMETEROL 250-50 MCG/DOSE IN AEPB: 1.0000 | INHALATION_SPRAY | Freq: Two times a day (BID) | RESPIRATORY_TRACT | Status: DC

## 2012-09-22 MED ORDER — ALBUTEROL SULFATE (5 MG/ML) 0.5% IN NEBU: 2.5000 mg | INHALATION_SOLUTION | Freq: Three times a day (TID) | RESPIRATORY_TRACT | Status: DC

## 2012-09-22 MED ORDER — METOPROLOL TARTRATE 25 MG PO TABS: 25.0000 mg | ORAL_TABLET | Freq: Two times a day (BID) | ORAL | Status: DC

## 2012-09-22 MED ORDER — NON FORMULARY: 1.0000 | Freq: Two times a day (BID) | Status: DC

## 2012-09-22 NOTE — Telephone Encounter (Signed)
[facility] called to get clarification on RX from [employer].

## 2012-09-22 NOTE — Telephone Encounter (Signed)
Pharmacy needed clarification on nebulizer treatment.  Original script was written for a concentrate not the solution.  Order taken care of patient will get solution.

## 2012-09-22 NOTE — Progress Notes (Signed)
This note has been reviewed and this clinician agrees with information provided.  

## 2012-09-22 NOTE — Discharge Instructions (Signed)
Inpatient Rehab Discharge Instructions  POLO TORCHIA Discharge date and time:    Activities/Precautions/ Functional Status: Activity: no lifting, driving, or strenuous exercise for till cleared by Dr. Kellie Simmering.  Diet: regular diet Wound Care: keep wound clean and dry. Wash with soap and water. Functional status:  ___ No restrictions     ___ Walk up steps independently ___ 24/7 supervision/assistance   ___ Walk up steps with assistance ___ Intermittent supervision/assistance  ___ Bathe/dress independently ___ Walk with walker     ___ Bathe/dress with assistance ___ Walk Independently    ___ Shower independently ___ Walk with assistance    ___ Shower with assistance _X__ No alcohol     ___ Return to work/school ________   COMMUNITY REFERRALS UPON DISCHARGE:    Home Health:   PT     RN                  Agency: [facility] Phone: [phone]   Medical Equipment/Items Ordered: oxygen, walker and home nebulizer machine                                                     Agency/Supplier: [facility]  @ [phone]    Special Instructions:    My questions have been answered and I understand these instructions. I will adhere to these goals and the provided educational materials after my discharge from the hospital.  Patient/Caregiver Signature _______________________________ Date __________  Clinician Signature _______________________________________ Date __________  Please bring this form and your medication list with you to all your follow-up doctor's appointments.

## 2012-09-22 NOTE — Progress Notes (Signed)
Pt discharged at 1100 to home with wife. Discharge instructions given to pt and wife from [place], [place]. All questions answered from nursing staff and case management. Equipment with pt and wife. No further questions at this time.

## 2012-09-22 NOTE — Progress Notes (Signed)
Patient ID: Brandon [name], male   DOB: 10/23/1941, 71 y.o.   MRN: [id number] Subjective/Complaints: Drops to 88% with ambulation off O2 improves to 93% with rest  A 12 point review of systems has been performed and if not noted above is otherwise negative.   Objective: Vital Signs: Blood pressure 119/80, pulse 82, temperature 98.1 F (36.7 C), temperature source Oral, resp. rate 18, height 5\' 11"  (1.803 m), weight 97.8 kg (215 lb 9.8 oz), SpO2 99.00%. Dg Chest 2 View  09/22/2012  *RADIOLOGY REPORT*  Clinical Data: Shortness of breath  CHEST - 2 VIEW  Comparison: 09/20/2012  Findings: The cardiac shadow is stable.  Bilateral pleural effusions are again seen.  The lungs are stable.  IMPRESSION: Bilateral pleural effusions with bilateral lower lobe infiltrates. The overall appearance is stable from prior exam.   Original Report Authenticated By: Alcide Clever, M.D.    Ct Chest Wo Contrast  09/20/2012  *RADIOLOGY REPORT*  Clinical Data: Short of breath, effusions, concern for pneumonia  CT CHEST WITHOUT CONTRAST  Technique:  Multidetector CT imaging of the chest was performed following the standard protocol without IV contrast.  Comparison: CT abdomen 09/08/2012  Findings: There are bilateral pleural effusions and associated passive atelectasis.  The effusions or small to moderate.  There is a focus of ground-glass opacity in the right middle lobe (image 30).  There is some branching nodular air space disease in the right lower lobe (image 33).  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  Exam somewhat degraded by patient respiratory motion. The central airways are normal.  No axillary or supraclavicular lymphadenopathy.  Trace pericardial effusion is noted   Coronary calcifications are present.  Limited view of the upper abdomen is unremarkable.  Limited view of the skeleton is unremarkable.  IMPRESSION:  1.  Moderate bilateral pleural effusions with passive atelectasis. 2.  Branching air space disease  in the right lower lobe and to a lesser degree in the right middle lobe concerning for infection versus less likely aspiration.   Original Report Authenticated By: Genevive Bi, M.D.    Dg Chest Right Decubitus  09/20/2012  *RADIOLOGY REPORT*  Clinical Data: Pleural effusion  CHEST - RIGHT DECUBITUS  Comparison: 09/20/2012  Findings: Right side down decubitus view does not demonstrate a large free flowing right effusion.  Compared to the chest x-ray earlier today the right effusion may be subpulmonic and loculated. If there is further concern, consider chest CT for better evaluation.  No pneumothorax evident.  IMPRESSION: No large free flowing right effusion dependently by decubitus imaging.  Suspect right effusion could be loculated versus subpulmonic.   Original Report Authenticated By: Judie Petit. Miles Costain, M.D.     Basename 09/20/12 0851  WBC 15.0*  HGB 11.2*  HCT 33.5*  PLT 361    Basename 09/20/12 0851  NA 135  K 4.1  CL 100  CO2 28  GLUCOSE 112*  BUN 13  CREATININE 0.66  CALCIUM 8.2*   CBG (last 3)  No results found for this basename: GLUCAP:3 in the last 72 hours  Wt Readings from Last 3 Encounters:  09/20/12 97.8 kg (215 lb 9.8 oz)  09/14/12 96.979 kg (213 lb 12.8 oz)  09/14/12 96.979 kg (213 lb 12.8 oz)    Physical Exam:   General: Alert and oriented x 3, no distress HEENT: Head is normocephalic, atraumatic, PERRLA, EOMI, sclera anicteric, oral mucosa pink and moist, dentition intact, ext ear canals clear,  Neck: Supple without JVD or lymphadenopathy Heart: Reg  rate and rhythm. No murmurs rubs or gallops Chest: CTA bilaterally but decreased airway at bases.. Occ rhonchi. Abdomen: Soft, non-tender,  Hyperactive BS, non tender. Staples in place Extremities: No clubbing or.cyanosis. Minimal pretibial/pedal edema LLE.  Pulses are 2+ Skin: Clean and intact without signs of breakdown Neuro: Pt is cognitively appropriate with normal insight, memory, and awareness. Cranial nerves  2-12 are intact. Sensory exam is normal. Reflexes are 2+ in all 4's. Fine motor coordination is intact. No tremors. Motor function is grossly 5/5.  Musculoskeletal: Full ROM, No pain with AROM or PROM in the neck, trunk, or extremities. Posture appropriate Psych: Pt's affect is appropriate. Pt is cooperative    Assessment/Plan: 1. Functional deficits secondary to deconditioning after AAA repair,stable for D/C PCP f/u in 2 weeks   See D/C summary  FIM: FIM - Bathing Bathing Steps Patient Completed: Chest;Right Arm;Left lower leg (including foot);Left Arm;Abdomen;Front perineal area;Buttocks;Right upper leg;Left upper leg;Right lower leg (including foot) Bathing: 6: Assistive device (Comment)  FIM - Upper Body Dressing/Undressing Upper body dressing/undressing steps patient completed: Thread/unthread right sleeve of pullover shirt/dresss;Thread/unthread left sleeve of pullover shirt/dress;Put head through opening of pull over shirt/dress;Pull shirt over trunk Upper body dressing/undressing: 6: Assistive device (Comment) FIM - Lower Body Dressing/Undressing Lower body dressing/undressing steps patient completed: Thread/unthread right underwear leg;Thread/unthread left underwear leg;Pull underwear up/down;Thread/unthread right pants leg;Thread/unthread left pants leg;Pull pants up/down;Fasten/unfasten pants;Don/Doff right shoe;Don/Doff left shoe;Fasten/unfasten right shoe;Fasten/unfasten left shoe;Don/Doff right sock;Don/Doff left sock Lower body dressing/undressing: 6: Assistive device (Comment)  FIM - Toileting Toileting steps completed by patient: Adjust clothing prior to toileting;Performs perineal hygiene;Adjust clothing after toileting Toileting Assistive Devices: Grab bar or rail for support Toileting: 6: Assistive device: No helper  FIM - Diplomatic Services operational officer Devices: Art gallery manager Transfers: 6-Assistive device: No helper  FIM - Event organiser Devices: Therapist, occupational: 6: Bed > Chair or W/C: No assist;6: Chair or W/C > Bed: No assist  FIM - Locomotion: Wheelchair Locomotion: Wheelchair: 0: Activity did not occur FIM - Locomotion: Ambulation Locomotion: Ambulation Assistive Devices: Designer, industrial/product Ambulation/Gait Assistance: 5: Supervision Locomotion: Ambulation: 6: Travels 150 ft or more with assistive device/no helper  Comprehension Comprehension Mode: Auditory Comprehension: 7-Follows complex conversation/direction: With no assist  Expression Expression Mode: Verbal Expression: 7-Expresses complex ideas: With no assist  Social Interaction Social Interaction: 7-Interacts appropriately with others - No medications needed.  Problem Solving Problem Solving: 7-Solves complex problems: Recognizes & self-corrects  Memory Memory: 7-Complete Independence: No helper  Medical Problem List and Plan:  1. DVT Prophylaxis/Anticoagulation: Mechanical: Sequential compression devices, below knee Bilateral lower extremities  2. Pain Management: tylenol or tramadol for breakthrough pain. No significant pain on exam today  3. Mood: team to provide ego support as necessary. Pt is fairly positive at this point.  4. Neuropsych: This patient is capable of making decisions on his/her own behalf.  5. ABLA: improving slowly.  Recheck today pending.  6. Hypokalemia: follow up labs today pending.  7. Leucocytosis: Likely reactive. No fevers since this weekend and wound healing well.    -?consolidation on CXR.CT RLL and RML airspace disease ? Infection, will ask pulmonary to evaluate  -on empiric avelox D#7, will D/C  -await recheck CBC. 8. Post op ileus: resolved. Tolerating regular diet.   -D/C reglan  -kub improved 9. Hypoxia: enlargement of pleural effusions. Will get decub view.  Decrease oxygen needs with therapy.  Encouraged  IS again. 10. Neuropathy RLE: nerve irritation from exercises yesterday---"was  hard doing the bike yesterday". Gets better with activity. Will try kpad. Exam is unremarkable , LOS (Days) 7 A FACE TO FACE EVALUATION WAS PERFORMED  Verenis Nicosia E 09/22/2012, 10:01 AM

## 2012-09-22 NOTE — Progress Notes (Signed)
SATURATION QUALIFICATIONS:  Patient Saturations on Room Air at Rest = 88%  Patient Saturations on [employer] while Ambulating = 88%  Patient Saturations on 2 Liters of oxygen while Ambulating = 95%  Statement of medical necessity for home oxygen: REquires home oxygen due to COPD and pulmonary effusions bilaterally.  Madie Reno

## 2012-09-22 NOTE — Progress Notes (Signed)
Social Work  Discharge Note  The overall goal for the admission was met for:   Discharge location: Yes - home with wife who can provide intermittent assistance  Length of Stay: Yes - 7 days  Discharge activity level: Yes - modified independent  Home/community participation: Yes  Services provided included: MD, RD, PT, OT, RN, TR, Pharmacy and SW  Financial Services: Medicare(*Blue Medicare)  Follow-up services arranged: Home Health: RN, PT via [facility] , DME: rolling walker, home O2 and home nebulizer via [facility] and Patient/Family has no preference for HH/DME agencies  Comments (or additional information):  Patient/Family verbalized understanding of follow-up arrangements: Yes  Individual responsible for coordination of the follow-up plan: patient  Confirmed correct DME delivered: Buster Schueller 09/22/2012    Mickelle Goupil

## 2012-09-23 NOTE — Discharge Summary (Signed)
NAMETRAYCEN, GOYER NO.:  [id number]  MEDICAL RECORD NO.:  [id number]  LOCATION:  4151                         FACILITY:  [facility]  PHYSICIAN:  Ranelle Oyster, M.D.DATE OF BIRTH:  13-Apr-1941  DATE OF ADMISSION:  09/15/2012 DATE OF DISCHARGE:  09/22/2012                              DISCHARGE SUMMARY   DISCHARGE DIAGNOSES: 1. 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 2. Bilateral pleural effusions. 3. Right lower lobe consolidation/pneumonia. 4. Leukocytosis. 5. Hypokalemia.  HISTORY OF PRESENT ILLNESS:  Brandon [name] is a 71 year old male with history of tobacco abuse, was admitted to Urgent Care on September 08, 2012, with severe abdominal pain and syncope due to ruptured AAA. He was taken to OR emergently for resection and grafting with insertion of aorta to common iliac graft by Dr. Hart Rochester on September 09, 2012.  He required a total of 10 units of packed red blood cells, 6 units fresh frozen plasma, 2 units platelets intraoperatively.  He was extubated on October 28.  Has required IV diuresis for fluid overload.  Issues with postop ileus have resolved and diet has been advanced.  Therapies were initiated and therapy team recommended CIR due to deconditioning.  PAST MEDICAL HISTORY:  Excision of right ganglion cyst, otherwise negative for chronic illnesses.  FUNCTIONAL HISTORY:  The patient was independent and working prior to admission.  He is a self-employed Administrator working full time. He  did not need assistive device.  FUNCTIONAL STATUS:  The patient was +2 total assist 40-70% for bed mobility, +2 total assist 70% for transfers, min assist for ambulating 100 feet x2 with verbal cues for posture.  He required mod to max assist for upper body care, max to total assist for lower body care.  Noted to have hypoxia requiring 6 liters O2 for sats to remain in mid 80s.  HOSPITAL COURSE:  Brandon [name] was admitted to rehab  on September 15, 2012, for inpatient therapies to consist of PT, OT at least 3 hours 5 days a week.  Past admission, physiatrist, rehab, RN, and therapy team have worked together to provide customized collaborative interdisciplinary care.  Rehab RN has worked with the patient on bowel and bladder issues as well as wound care monitoring.  The patient's blood pressures were monitored on b.i.d. basis, and these were reasonably controlled ranging from 120s-130s systolics, 60s-70s diastolic.  Labs done past admission revealed hypokalemia with potassium at 3.0 and the patient was supplemented with oral potassium. Recheck labs of November 6 shows resolution of hypokalemia with sodium at 135, potassium 4.1, chloride 100, CO2 of 28, BUN 13, creatinine 0.66, glucose 112. On 11/02, the patient was noted to be febrile with T-max of 101.2.  Chest x-ray was done showing right lower lobe effusion and the patient was started on Avelox for pneumonia.  Followup CBC of November 6 revealed continued leukocytosis with white count at 15.0.  H and H was noted to be improving at 11.3 and 35.5.  The patient's hypoxia did improve with the patient having decrease in O2 needs.   Followup chest x-ray was done for evaluation and revealed enlarging pleural effusion with slight worsening of bibasilar atelectasis  and consolidation.  A CT of chest was done for followup showing moderate bilateral pleural effusions with passive atelectasis and branching airspace disease in right lower lobe and right mid lobe with concern for infection.  Pulmonary was contacted for input.  They recommended followup chest x-ray which was done on November 8 and was noted to be stable.  Dr. Sung Amabile felt that his effusions would resolve over time.  There was no evidence of reversible airflow obstruction.  He felt that effusions were suggestive of a component of CHF/volume overload.  The patient was treated with a dose of IV Lasix.  He was  also started on Advair 250/50, 1 puff b.i.d. as well as nebulizers and was instructed on strict pulmonary hygiene with flutter valve use q.2 hours. Recommendations are to have followup chest x-ray done in 1 week with referral to Pulmonary if no improvement seen.  The patient's abdominal incision has been healing well without any signs or symptoms of infection.  No drainage noted.  Mild staple irritation is noted.  The patient is to follow up with Dr. Hart Rochester for staple removal in the future.  He was noted to have some lower extremity edema and bilateral lower extremity Dopplers were done and were negative for DVT. Therapies have been ongoing during this stay, and the patient has made good progress.  He is modified independent for transfers and household ambulation.  He is able to navigate 1 flight of stairs at modified independent level.  He does require close supervision to min assist when fatigued and when not using his assistive device.  OT has worked with the patient on self-care tasks.  They have worked on endurance, balance, as well as activity tolerance.  The patient is at modified independent for ADLs as well as functional mobility with rolling walker.  He has been educated on energy conservation techniques during self-care tasks. His family will provide assistance as needed past discharge.  Further  followup home health therapies to include PT and RN by [facility] past discharge.  On September 22, 2012, the patient is discharged to  home in improved condition.  DISCHARGE MEDICATIONS: 1. Advair 250/50, 1 puff b.i.d. 2. Albuterol nebs t.i.d. 3. Lopressor 25 mg b.i.d.  DIET:  Regular.  ACTIVITY LEVEL:  As tolerated with the use of rolling walker.  No strenuous activity.  No driving.  No lifting over 5 pounds until cleared by Dr. Hart Rochester.  WOUND CARE:  Keep area clean and dry.  Wash with soap and water and pat dry.  SPECIAL INSTRUCTIONS:  [facility] to provide  PT and RN.  Continue using O2 2 L per nasal cannula with activity and at bedtime.  FOLLOWUP:  The patient to follow up with Dr. Hart Rochester on November 19th at 9:30 a.m. Follow up with Dr. Azucena Cecil November 18th at 11 a.m. for 11:30 Appointment. Follow up with Dr. Riley Kill as needed.     Delle Reining, P.A.   ______________________________ Ranelle Oyster, M.D.    PL/MEDQ  D:  09/22/2012  T:  09/23/2012  Job:  [id number]  cc:   Quita Skye. Hart Rochester, M.D. Tally Joe, M.D. Oley Balm Sung Amabile, MD

## 2012-09-24 LAB — CULTURE, RESPIRATORY W GRAM STAIN: Culture: NORMAL

## 2012-10-02 ENCOUNTER — Encounter: Payer: Self-pay | Admitting: Vascular Surgery

## 2012-10-03 ENCOUNTER — Ambulatory Visit (INDEPENDENT_AMBULATORY_CARE_PROVIDER_SITE_OTHER): Payer: Self-pay | Admitting: Vascular Surgery

## 2012-10-03 ENCOUNTER — Encounter: Payer: Self-pay | Admitting: Vascular Surgery

## 2012-10-03 VITALS — BP 132/79 | HR 69 | Resp 20 | Ht 69.0 in | Wt 187.0 lb

## 2012-10-03 DIAGNOSIS — I713 Abdominal aortic aneurysm, ruptured, unspecified: Secondary | ICD-10-CM

## 2012-10-03 DIAGNOSIS — Z48812 Encounter for surgical aftercare following surgery on the circulatory system: Secondary | ICD-10-CM

## 2012-10-03 NOTE — Progress Notes (Signed)
Subjective:     Patient ID: Brandon [name], male   DOB: 1941/05/25, 71 y.o.   MRN: [id number]  HPI this 71 year old male returns for initial followup regarding his surgery for ruptured abdominal aortic aneurysm on 09/08/2012. Patient states in rehabilitation for one week following discharge from the hospital and has now been home since November 11. He is occasionally using nasal oxygen at home but not constantly. He was smoking one pack of cigarettes per day preoperatively and he has not had any cigarettes since his emergency surgery and has promised that he will quit completely. He is gradually increasing his activity level. He is not having any abdominal pain. He is having regular bowel movements and eating a diet  Review of Systems     Objective:   Physical ExamBP 132/79  Pulse 69  Resp 20  Ht 5\' 9"  (1.753 m)  Wt 187 lb (84.823 kg)  BMI 27.62 kg/m2  Well-developed well-nourished male no apparent stress alert and oriented x3 Lungs no rhonchi or wheezing no nasal oxygen at present time Abdomen relatively soft. Midline incision well-healed-skin staples removed A 3+ femoral and 2+ posterior tibial pulses palpable bilaterally    Assessment:     Doing well 26 days post emergency surgery for ruptured abdominal aortic aneurysm-patient with COPD and long history of tobacco abuse    Plan:     #1 continue to increase activity as tolerated #2 continue to abstain from tobacco #3 we'll followup with medical doctor regarding when to discontinue home oxygen #4 okay to begin driving short distances and 2 more weeks #5 return to see me in 2 months

## 2012-10-04 NOTE — Addendum Note (Signed)
Addended by: Sharee Pimple on: 10/04/2012 11:43 AM   Modules accepted: Orders

## 2012-12-04 ENCOUNTER — Encounter: Payer: Self-pay | Admitting: Vascular Surgery

## 2012-12-05 ENCOUNTER — Encounter: Payer: Self-pay | Admitting: Vascular Surgery

## 2012-12-05 ENCOUNTER — Encounter (INDEPENDENT_AMBULATORY_CARE_PROVIDER_SITE_OTHER): Payer: Medicare Other | Admitting: *Deleted

## 2012-12-05 ENCOUNTER — Ambulatory Visit (INDEPENDENT_AMBULATORY_CARE_PROVIDER_SITE_OTHER): Payer: Medicare Other | Admitting: Vascular Surgery

## 2012-12-05 VITALS — BP 159/76 | HR 62 | Ht 69.0 in | Wt 180.0 lb

## 2012-12-05 DIAGNOSIS — I714 Abdominal aortic aneurysm, without rupture, unspecified: Secondary | ICD-10-CM | POA: Insufficient documentation

## 2012-12-05 DIAGNOSIS — I713 Abdominal aortic aneurysm, ruptured, unspecified: Secondary | ICD-10-CM

## 2012-12-05 DIAGNOSIS — Z48812 Encounter for surgical aftercare following surgery on the circulatory system: Secondary | ICD-10-CM

## 2012-12-05 NOTE — Progress Notes (Signed)
Subjective:     Patient ID: Brandon [name], male   DOB: 03-14-1941, 72 y.o.   MRN: [id number]  HPI this 72 year old male returns now almost 3 months post ruptured abdominal aortic aneurysm resection. He has chronic COPD and did require home oxygen for a period of time following discharge from the hospital. He is off of his oxygen and has not smoked a cigarette since his surgery. He can walk several blocks without difficulty and no claudication is noted. He has a good appetite and is regaining his energy level. He is driving an automobile.  Review of Systems     Objective:   Physical ExamBP 159/76  Pulse 62  Ht 5\' 9"  (1.753 m)  Wt 180 lb (81.647 kg)  BMI 26.58 kg/m2  SpO2 98%  General well-developed well-nourished male no apparent stress alert and oriented x3 Lungs no rhonchi or wheezing Abdomen soft no pulsatile mass noted midline incision well-healed-no evidence of ventral hernia 3+ femoral and posterior tibial pulses palpable bilaterally but well perfused lower extremities  ABIs today greater than 1 bilaterally    Assessment:     Doing well 3 months post repair of ruptured abdominal aortic aneurysm History of tobacco abuse but has not smoked since surgery and is not intended to restart    Plan:     Return in 6 months with check of ABIs and see nurse practitioner for continued followup-doing very well

## 2012-12-06 ENCOUNTER — Other Ambulatory Visit: Payer: Self-pay | Admitting: *Deleted

## 2012-12-06 DIAGNOSIS — I714 Abdominal aortic aneurysm, without rupture, unspecified: Secondary | ICD-10-CM

## 2012-12-06 DIAGNOSIS — Z48812 Encounter for surgical aftercare following surgery on the circulatory system: Secondary | ICD-10-CM

## 2013-05-14 ENCOUNTER — Encounter: Payer: Self-pay | Admitting: Family Medicine

## 2013-06-04 ENCOUNTER — Ambulatory Visit: Payer: Medicare Other | Admitting: Neurosurgery

## 2013-06-04 ENCOUNTER — Encounter (INDEPENDENT_AMBULATORY_CARE_PROVIDER_SITE_OTHER): Payer: Medicare Other | Admitting: *Deleted

## 2013-06-04 DIAGNOSIS — I714 Abdominal aortic aneurysm, without rupture, unspecified: Secondary | ICD-10-CM

## 2013-06-04 DIAGNOSIS — Z48812 Encounter for surgical aftercare following surgery on the circulatory system: Secondary | ICD-10-CM

## 2013-06-04 DIAGNOSIS — I713 Abdominal aortic aneurysm, ruptured, unspecified: Secondary | ICD-10-CM

## 2013-06-05 ENCOUNTER — Other Ambulatory Visit: Payer: Self-pay | Admitting: *Deleted

## 2013-06-05 DIAGNOSIS — I739 Peripheral vascular disease, unspecified: Secondary | ICD-10-CM

## 2013-06-05 DIAGNOSIS — Z48812 Encounter for surgical aftercare following surgery on the circulatory system: Secondary | ICD-10-CM

## 2013-06-07 ENCOUNTER — Encounter: Payer: Self-pay | Admitting: Vascular Surgery

## 2013-09-03 NOTE — Progress Notes (Signed)
This note has been reviewed and this clinician agrees with information provided.  

## 2013-12-05 ENCOUNTER — Ambulatory Visit (INDEPENDENT_AMBULATORY_CARE_PROVIDER_SITE_OTHER): Payer: Medicare HMO | Admitting: Family Medicine

## 2013-12-05 VITALS — BP 140/90 | Temp 99.0°F | Resp 16 | Ht 69.5 in | Wt 204.0 lb

## 2013-12-05 DIAGNOSIS — M25569 Pain in unspecified knee: Secondary | ICD-10-CM

## 2013-12-05 DIAGNOSIS — L02419 Cutaneous abscess of limb, unspecified: Secondary | ICD-10-CM

## 2013-12-05 DIAGNOSIS — L03119 Cellulitis of unspecified part of limb: Principal | ICD-10-CM

## 2013-12-05 MED ORDER — DOXYCYCLINE HYCLATE 100 MG PO TABS: 100.0000 mg | ORAL_TABLET | Freq: Two times a day (BID) | ORAL | Status: DC

## 2013-12-05 MED ORDER — CEFTRIAXONE SODIUM 1 G IJ SOLR
1.0000 g | INTRAMUSCULAR | Status: DC
  Administered 2013-12-05: 1 g via INTRAMUSCULAR

## 2013-12-05 NOTE — Progress Notes (Signed)
Procedure Note: Verbal consent obtained.  Local anesthesia with 2 cc 2% lidocaine.  Betadine prep.  Incision with 11 blade.  Moderate purulence expressed with deep palpation.  Wound explored with curved hemostats revealing no loculations.  Wound irrigated with remaining anesthetic.  Packed with 1/4 inch plain packing.  Cleansed and dressed.  Discussed wound care.  Pt tolerated very well.  RTC 48 hours for recheck

## 2013-12-05 NOTE — Progress Notes (Signed)
Chief Complaint:  Chief Complaint  Patient presents with  . Mass    on back of (L) leg    HPI: Brandon [name] is a 73 y.o. male who is here for  1 week history of left posterior thigh pain x 5 days. COld chills last night.  No prior skin infection, he has been picking on it. Has had drainage from are , he denis diabetes. He works in Biomedical scientist. He denies being bit by an insect but he may have been stuck by a brush.  He was picking at it and it has gotten more painful and red, warm. He deneis fever. + chills  Past Medical History  Diagnosis Date  . Shortness of breath   . COPD (chronic obstructive pulmonary disease)   . Peripheral vascular disease   . AAA (abdominal aortic aneurysm)    Past Surgical History  Procedure Laterality Date  . Abdominal aortic aneurysm repair  09/08/2012    Procedure: ANEURYSM ABDOMINAL AORTIC REPAIR;  Surgeon: [name], MD;  Location: [facility];  Service: Vascular;  Laterality: N/A;  open repair ruptured abdomenal aortic aneurysm  . Ganglion cyst excision      Right wrist  . Abdominal aortic aneurysm repair     History   Social History  . Marital Status: Married    Spouse Name: N/A    Number of Children: N/A  . Years of Education: N/A   Social History Main Topics  . Smoking status: Former Smoker -- 1.00 packs/day for 60 years    Quit date: 09/08/2012  . Smokeless tobacco: None  . Alcohol Use: No  . Drug Use: No  . Sexual Activity: Not Currently    Birth Control/ Protection: None   Other Topics Concern  . None   Social History Narrative  . None   Family History  Problem Relation Age of Onset  . Dementia Mother   . Cancer Father    No Known Allergies Prior to Admission medications   Medication Sig Start Date End Date Taking? Authorizing Provider  albuterol (PROVENTIL) (5 MG/ML) 0.5% nebulizer solution Take 0.5 mLs (2.5 mg total) by nebulization 3 (three) times daily. 09/22/12   Ivan Anchors Love, PA-C  albuterol (PROVENTIL)  (5 MG/ML) 0.5% nebulizer solution Take 0.5 mLs (2.5 mg total) by nebulization 3 (three) times daily. 09/22/12   Bary Leriche, PA-C  Fluticasone-Salmeterol (ADVAIR) 250-50 MCG/DOSE AEPB Inhale 1 puff into the lungs 2 (two) times daily. 09/22/12   Bary Leriche, PA-C  metoprolol tartrate (LOPRESSOR) 25 MG tablet Take 1 tablet (25 mg total) by mouth 2 (two) times daily. FOR BLOOD PRESSURE 09/22/12   Ivan Anchors Love, PA-C     ROS: The patient denies fevers,  night sweats, unintentional weight loss, chest pain, palpitations, wheezing, dyspnea on exertion, nausea, vomiting, abdominal pain, dysuria, hematuria, melena, numbness, weakness, or tingling.   All other systems have been reviewed and were otherwise negative with the exception of those mentioned in the HPI and as above.    PHYSICAL EXAM: Filed Vitals:   12/05/13 1720  BP: 140/90  Temp: 99 F (37.2 C)  Resp: 16   Filed Vitals:   12/05/13 1720  Height: 5' 9.5" (1.765 m)  Weight: 204 lb (92.534 kg)   Body mass index is 29.7 kg/(m^2).  General: Alert, no acute distress HEENT:  Normocephalic, atraumatic, oropharynx patent. EOMI, PERRLA Cardiovascular:  Regular rate and rhythm, no rubs murmurs or gallops.  No Carotid  bruits, radial pulse intact. No pedal edema.  Respiratory: Clear to auscultation bilaterally.  No wheezes, rales, or rhonchi.  No cyanosis, no use of accessory musculature GI: No organomegaly, abdomen is soft and non-tender, positive bowel sounds.  No masses. Skin: 9 x 7 cm abscess, warm, red, tender, cellulitltis inner mid-posterior thigh Neurologic: Facial musculature symmetric. Psychiatric: Patient is appropriate throughout our interaction. Lymphatic: No cervical lymphadenopathy Musculoskeletal: Gait intact.    LABS:  EKG/XRAY:   Primary read interpreted by Dr. Marin Comment at [facility].   ASSESSMENT/PLAN: Encounter Diagnosis  Name Primary?  . Cellulitis and abscess of leg Yes   Patient given 1 gram of rocephin in  office Wound cx pending Picture taken After care instructions given Doxycycline was given to him in office 100 mg BID x 10 days, no refills.  F/u prn otherwise wound check in 2 days  Gross sideeffects, risk and benefits, and alternatives of medications d/w patient. Patient is aware that all medications have potential sideeffects and we are unable to predict every sideeffect or drug-drug interaction that may occur.  LE, [place], DO 12/05/2013 6:16 PM

## 2013-12-05 NOTE — Patient Instructions (Signed)
Abscess An abscess is an infected area that contains a collection of pus and debris.It can occur in almost any part of the body. An abscess is also known as a furuncle or boil. CAUSES  An abscess occurs when tissue gets infected. This can occur from blockage of oil or sweat glands, infection of hair follicles, or a minor injury to the skin. As the body tries to fight the infection, pus collects in the area and creates pressure under the skin. This pressure causes pain. People with weakened immune systems have difficulty fighting infections and get certain abscesses more often.  SYMPTOMS Usually an abscess develops on the skin and becomes a painful mass that is red, warm, and tender. If the abscess forms under the skin, you may feel a moveable soft area under the skin. Some abscesses break open (rupture) on their own, but most will continue to get worse without care. The infection can spread deeper into the body and eventually into the bloodstream, causing you to feel ill.  DIAGNOSIS  Your caregiver will take your medical history and perform a physical exam. A sample of fluid may also be taken from the abscess to determine what is causing your infection. TREATMENT  Your caregiver may prescribe antibiotic medicines to fight the infection. However, taking antibiotics alone usually does not cure an abscess. Your caregiver may need to make a small cut (incision) in the abscess to drain the pus. In some cases, gauze is packed into the abscess to reduce pain and to continue draining the area. HOME CARE INSTRUCTIONS   Only take over-the-counter or prescription medicines for pain, discomfort, or fever as directed by your caregiver.  If you were prescribed antibiotics, take them as directed. Finish them even if you start to feel better.  If gauze is used, follow your caregiver's directions for changing the gauze.  To avoid spreading the infection:  Keep your draining abscess covered with a  bandage.  Wash your hands well.  Do not share personal care items, towels, or whirlpools with others.  Avoid skin contact with others.  Keep your skin and clothes clean around the abscess.  Keep all follow-up appointments as directed by your caregiver. SEEK MEDICAL CARE IF:   You have increased pain, swelling, redness, fluid drainage, or bleeding.  You have muscle aches, chills, or a general ill feeling.  You have a fever. MAKE SURE YOU:   Understand these instructions.  Will watch your condition.  Will get help right away if you are not doing well or get worse. Document Released: 08/11/2005 Document Revised: 05/02/2012 Document Reviewed: 01/14/2012 [facility] Patient Information 2014 [place]. Abscess Care After An abscess (also called a boil or furuncle) is an infected area that contains a collection of pus. Signs and symptoms of an abscess include pain, tenderness, redness, or hardness, or you may feel a moveable soft area under your skin. An abscess can occur anywhere in the body. The infection may spread to surrounding tissues causing cellulitis. A cut (incision) by the surgeon was made over your abscess and the pus was drained out. Gauze may have been packed into the space to provide a drain that will allow the cavity to heal from the inside outwards. The boil may be painful for 5 to 7 days. Most people with a boil do not have high fevers. Your abscess, if seen early, may not have localized, and may not have been lanced. If not, another appointment may be required for this if it does not  get better on its own or with medications. HOME CARE INSTRUCTIONS   Only take over-the-counter or prescription medicines for pain, discomfort, or fever as directed by your caregiver.  When you bathe, soak and then remove gauze or iodoform packs at least daily or as directed by your caregiver. You may then wash the wound gently with mild soapy water. Repack with gauze or do as your  caregiver directs. SEEK IMMEDIATE MEDICAL CARE IF:   You develop increased pain, swelling, redness, drainage, or bleeding in the wound site.  You develop signs of generalized infection including muscle aches, chills, fever, or a general ill feeling.  An oral temperature above 102 F (38.9 C) develops, not controlled by medication. See your caregiver for a recheck if you develop any of the symptoms described above. If medications (antibiotics) were prescribed, take them as directed. Document Released: 05/20/2005 Document Revised: 01/24/2012 Document Reviewed: 01/15/2008 [facility] Patient Information 2014 [place]. Cellulitis Cellulitis is an infection of the skin and the tissue beneath it. The infected area is usually red and tender. Cellulitis occurs most often in the arms and lower legs.  CAUSES  Cellulitis is caused by bacteria that enter the skin through cracks or cuts in the skin. The most common types of bacteria that cause cellulitis are Staphylococcus and Streptococcus. SYMPTOMS   Redness and warmth.  Swelling.  Tenderness or pain.  Fever. DIAGNOSIS  Your caregiver can usually determine what is wrong based on a physical exam. Blood tests may also be done. TREATMENT  Treatment usually involves taking an antibiotic medicine. HOME CARE INSTRUCTIONS   Take your antibiotics as directed. Finish them even if you start to feel better.  Keep the infected arm or leg elevated to reduce swelling.  Apply a warm cloth to the affected area up to 4 times per day to relieve pain.  Only take over-the-counter or prescription medicines for pain, discomfort, or fever as directed by your caregiver.  Keep all follow-up appointments as directed by your caregiver. SEEK MEDICAL CARE IF:   You notice red streaks coming from the infected area.  Your red area gets larger or turns dark in color.  Your bone or joint underneath the infected area becomes painful after the skin has  healed.  Your infection returns in the same area or another area.  You notice a swollen bump in the infected area.  You develop new symptoms. SEEK IMMEDIATE MEDICAL CARE IF:   You have a fever.  You feel very sleepy.  You develop vomiting or diarrhea.  You have a general ill feeling (malaise) with muscle aches and pains. MAKE SURE YOU:   Understand these instructions.  Will watch your condition.  Will get help right away if you are not doing well or get worse. Document Released: 08/11/2005 Document Revised: 05/02/2012 Document Reviewed: 01/17/2012 [facility] Patient Information 2014 [place].

## 2013-12-07 ENCOUNTER — Ambulatory Visit (INDEPENDENT_AMBULATORY_CARE_PROVIDER_SITE_OTHER): Payer: Medicare HMO | Admitting: Physician Assistant

## 2013-12-07 VITALS — BP 126/80 | HR 74 | Temp 98.1°F | Resp 18 | Ht 69.5 in | Wt 204.0 lb

## 2013-12-07 DIAGNOSIS — L02419 Cutaneous abscess of limb, unspecified: Secondary | ICD-10-CM

## 2013-12-07 DIAGNOSIS — L03119 Cellulitis of unspecified part of limb: Principal | ICD-10-CM

## 2013-12-07 NOTE — Patient Instructions (Addendum)
Continue the antibiotic as prescribed. Lie on stomach as much as possible to avoid pressure on the area. Apply a warm compress to the area for 15-20 minutes 2-4 times each day. Return on Sunday, 12/09/13 for wound care.

## 2013-12-07 NOTE — Progress Notes (Signed)
   Subjective:    Patient ID: Brandon [name], male    DOB: 1941-01-17, 73 y.o.   MRN: [id number]  HPI  Presents for wound care.  S/P I&D of an abscess on the posterior thigh on 12/05/2013.  He's tolerating Doxycycline well and without adverse effects.  No fever/chills.  Notes reduced pain compared with his initial visit. He has been sitting on a heating pad.  Review of Systems     Objective:   Physical Exam  BP 126/80  Pulse 74  Temp(Src) 98.1 F (36.7 C) (Oral)  Resp 18  Ht 5' 9.5" (1.765 m)  Wt 204 lb (92.534 kg)  BMI 29.70 kg/m2  SpO2 98% WDWNWM, A&O x 3. Dressing and packing removed. Large amount of thick purulence oozing from the wound.  The mouth of the wound has considerable yellow adherent material which is difficult to remove. The redness appears to have reduced some, though there is still significant induration.  Photo taken (find in Media). Irrigated with 5 cc of 2% lidocaine plain and repacked with 1/2 inch plain packing and dressed.      Assessment & Plan:  1. Cellulitis and abscess of leg Continue doxy.  Counseled on proper use of heating pad.  RTC 48 hours for wound care with Ms. Marte, PA-C.   Fara Chute, PA-C Physician Assistant-Certified [facility]

## 2013-12-08 LAB — WOUND CULTURE
Gram Stain: NONE SEEN
Gram Stain: NONE SEEN

## 2013-12-09 ENCOUNTER — Ambulatory Visit (INDEPENDENT_AMBULATORY_CARE_PROVIDER_SITE_OTHER): Payer: Medicare HMO | Admitting: Family Medicine

## 2013-12-09 VITALS — BP 120/80 | HR 70 | Temp 98.0°F | Resp 16 | Ht 69.5 in | Wt 204.0 lb

## 2013-12-09 DIAGNOSIS — L03119 Cellulitis of unspecified part of limb: Principal | ICD-10-CM

## 2013-12-09 DIAGNOSIS — L02419 Cutaneous abscess of limb, unspecified: Secondary | ICD-10-CM

## 2013-12-09 LAB — POCT CBC
Granulocyte percent: 67.9 %G (ref 37–80)
HCT, POC: 45.4 % (ref 43.5–53.7)
Hemoglobin: 14.1 g/dL (ref 14.1–18.1)
Lymph, poc: 1.7 (ref 0.6–3.4)
MCH, POC: 31.1 pg (ref 27–31.2)
MCHC: 31.1 g/dL — AB (ref 31.8–35.4)
MCV: 99.9 fL — AB (ref 80–97)
MID (cbc): 0.5 (ref 0–0.9)
MPV: 8.9 fL (ref 0–99.8)
POC Granulocyte: 4.8 (ref 2–6.9)
POC LYMPH PERCENT: 25 %L (ref 10–50)
POC MID %: 7.1 %M (ref 0–12)
Platelet Count, POC: 253 10*3/uL (ref 142–424)
RBC: 4.54 M/uL — AB (ref 4.69–6.13)
RDW, POC: 14.1 %
WBC: 7 10*3/uL (ref 4.6–10.2)

## 2013-12-09 MED ORDER — CEFTRIAXONE SODIUM 1 G IJ SOLR
1.0000 g | Freq: Once | INTRAMUSCULAR | Status: AC
  Administered 2013-12-09: 1 g via INTRAMUSCULAR

## 2013-12-09 NOTE — Progress Notes (Signed)
Subjective:    Patient ID: Brandon [name], male    DOB: 01/08/1941, 73 y.o.   MRN: [id number]  HPI 73 year old male presents for wound check of cellulitis on left posterior thigh s/p I&D on 1/21.  He states it is "doing ok."  He is concerned because he has noticed that the redness seems to have spread and he has noticed it moving anteriorly around his thigh as well. He has been lying on his side with a heating pad on his leg.  Has noted some drainage on the dressing.    Tolerating doxycycline.   Denies fever, chills, nausea, vomiting, or dizziness.   See previous notes.     Review of Systems  Constitutional: Negative for fever and chills.  Gastrointestinal: Negative for nausea and vomiting.  Musculoskeletal: Negative for myalgias.  Skin: Positive for color change and wound.  Neurological: Negative for dizziness.       Objective:   Physical Exam  Constitutional: He is oriented to person, place, and time. He appears well-developed and well-nourished.  HENT:  Head: Normocephalic and atraumatic.  Right Ear: External ear normal.  Left Ear: External ear normal.  Eyes: Conjunctivae are normal.  Neck: Normal range of motion.  Cardiovascular: Normal rate.   Pulmonary/Chest: Effort normal.  Musculoskeletal:       Legs: Noted area + for wound with packing in place. Surrounding area has significant amount of erythema that extends beyond the borders of the previously marked area (skin scribe noted on 1/21). There is significant induration along medial aspect of his thigh.   Neurological: He is alert and oriented to person, place, and time.  Psychiatric: He has a normal mood and affect. His behavior is normal. Judgment and thought content normal.     Procedure: Dressing and packing removed. Moderate amount of purulence on dressing. Irrigated with 5 cc of 2% plain lidocaine. There is yellow, adherent material along wound edge. Not tender to palpation but there extensive induration  surrounding wound and extending inferiorly down thigh.  Erythema outside of previously marked area. Unable to express any purulence.  Repacked with 1/4 plain packing.  Dressing applied.       Results for orders placed in visit on 12/09/13  POCT CBC      Result Value Range   WBC 7.0  4.6 - 10.2 K/uL   Lymph, poc 1.7  0.6 - 3.4   POC LYMPH PERCENT 25.0  10 - 50 %L   MID (cbc) 0.5  0 - 0.9   POC MID % 7.1  0 - 12 %M   POC Granulocyte 4.8  2 - 6.9   Granulocyte percent 67.9  37 - 80 %G   RBC 4.54 (*) 4.69 - 6.13 M/uL   Hemoglobin 14.1  14.1 - 18.1 g/dL   HCT, POC 45.4  43.5 - 53.7 %   MCV 99.9 (*) 80 - 97 fL   MCH, POC 31.1  27 - 31.2 pg   MCHC 31.1 (*) 31.8 - 35.4 g/dL   RDW, POC 14.1     Platelet Count, POC 253  142 - 424 K/uL   MPV 8.9  0 - 99.8 fL    Assessment & Plan:  Cellulitis and abscess of leg - Plan: POCT CBC, cefTRIAXone (ROCEPHIN) injection 1 g, Ambulatory referral to General Surgery Cellulitis and induration seem to have worsened since last OV.  His pain has improved and he feels well, but I am concerned about the amount of erythema  and induration.  Will refer to General Surgery for further evaluation and treatment. Rocephin 1 gram given today. Continue doxycycline as directed.  With normal CBC and patient is afebrile, ok to wait until tomorrow for him to see General Surgery.  If symptoms acutely worsening before then, go to ER.  Recheck here tomorrow if unable to be worked in to surgery.

## 2013-12-09 NOTE — Progress Notes (Signed)
Patient examined and discussed with Georgiann Mccoy, PA-C. Agree with assessment and plan of care per her note.

## 2013-12-10 ENCOUNTER — Ambulatory Visit (INDEPENDENT_AMBULATORY_CARE_PROVIDER_SITE_OTHER): Payer: Medicare HMO | Admitting: Physician Assistant

## 2013-12-10 ENCOUNTER — Telehealth: Payer: Self-pay

## 2013-12-10 VITALS — BP 134/86 | HR 76 | Temp 98.2°F | Resp 16 | Ht 70.5 in | Wt 210.2 lb

## 2013-12-10 DIAGNOSIS — L02419 Cutaneous abscess of limb, unspecified: Secondary | ICD-10-CM

## 2013-12-10 DIAGNOSIS — L03119 Cellulitis of unspecified part of limb: Principal | ICD-10-CM

## 2013-12-10 MED ORDER — CEFTRIAXONE SODIUM 1 G IJ SOLR
1.0000 g | Freq: Once | INTRAMUSCULAR | Status: AC
  Administered 2013-12-10: 1 g via INTRAMUSCULAR

## 2013-12-10 NOTE — Progress Notes (Signed)
   Subjective:    Patient ID: HANDY MCLOUD, male    DOB: 24-Aug-1941, 73 y.o.   MRN: [id number]  HPI   Mr. Slayden is a very pleasant 73 yr old male here for wound care following I&D of a leg abscess on 12/05/13.  Previous notes reviewed.  Pt had been improving, until yesterday when the cellulitis began spreading beyond the initially outlined area.  Pt was given 1g ctx IM and referred to gen surg.  Apparently there has been an insurance issue with getting him into see [facility].  He has spoken to his primary at [facility] and sent the appropriate paperwork.  We are hopeful that he can get in there tomorrow.    Today pt states he is doing well.  The erythematous area continues to spread medially and wrap around to his anterior thigh.  This area is not tender.  Slight tenderness around the wound, but overall feeling better.  He continues to tolerate the antibiotics well.  He denies fever, chills, nausea, vomiting   Review of Systems  Constitutional: Negative for fever and chills.  Respiratory: Negative.   Cardiovascular: Negative.   Gastrointestinal: Negative.   Skin: Positive for color change and wound.       Objective:   Physical Exam  Vitals reviewed. Constitutional: He is oriented to person, place, and time. He appears well-developed and well-nourished. No distress.  HENT:  Head: Normocephalic and atraumatic.  Eyes: Conjunctivae are normal. No scleral icterus.  Pulmonary/Chest: Effort normal.  Neurological: He is alert and oriented to person, place, and time.  Skin: Skin is warm and dry.     Healing abscess at posterior left thigh; erythema as spread medially beyond the initially outlined border; left thigh swollen and indurated compared to the right; slight warmth; leg is non tender except for around wound edges; significant amount of necrotic tissue tightly adhered to wound edges; wound bed appears healthy; unable to express any drainage  Psychiatric: He has a normal mood and affect. His  behavior is normal.   Cx: MSSA  Wound Care; Dressing and packing removed.  Both saturated with drainage.  Unable to express any drainage from the wound.  Local anesthesia with 0.5cc 2% plain lidocaine.  Necrotic tissue sharply debrided from wound edges with dermal curette and iris scissors.  Debrided to healthy bleeding tissue.  Repacked and dressed.      Assessment & Plan:  Cellulitis and abscess of leg - Plan: cefTRIAXone (ROCEPHIN) injection 1 g   Mr. Wardlow is a very pleasant 73 yr old male here for wound care.  The wound appears to be healing well although there is still a significant amount of erythema and induration extending beyond initial border.  He is afebrile, white count normal at visit yesterday.  He does note subjective improvement from yesterday to today.  Will administer another gram of rocephin.  Continue doxy.  There has been some trouble getting him in to see surgery.  He will hopefully be able to see them tomorrow - will call in the AM to verify that he is scheduled.  Change dressing tomorrow to prevent maceration of surrounding skin.  Recheck here if unable to see surgery.  Fast track card updated  E. Natividad Brood MHS, PA-C [facility] 1/26/20158:32 PM

## 2013-12-10 NOTE — Telephone Encounter (Addendum)
Estill Bamberg from [facility] states that the patient stated that in order for him to have his referral completed they will need to authorize it since they are his pcp, they in turn would like for [place] to fax over records associated with why he needs the referral for the boil on his leg. Patient just so happened to be checking in when they called and he has completed a release so those records could be sent. Thanks!! Best# [id number]  [phone] fax

## 2013-12-10 NOTE — Patient Instructions (Signed)
Change the dressing in the morning (leave the packing in place.)    Hopefully you will see [facility] tomorrow - I will make sure we call their office first thing in the morning to get you in  If for some reason you cannot get in with them, please recheck here tomorrow (12/11/13) - I am here until 5pm  Keep taking the medicine as directed

## 2013-12-11 ENCOUNTER — Telehealth: Payer: Self-pay | Admitting: *Deleted

## 2013-12-11 NOTE — Telephone Encounter (Signed)
Done

## 2013-12-11 NOTE — Telephone Encounter (Signed)
Pt will be seeing Dr. Moreen Fowler at 430pm to get referral to [facility].  He will be in here before he goes in to be seen for his recheck.

## 2013-12-12 ENCOUNTER — Ambulatory Visit (INDEPENDENT_AMBULATORY_CARE_PROVIDER_SITE_OTHER): Payer: Medicare HMO | Admitting: Surgery

## 2013-12-12 ENCOUNTER — Encounter (INDEPENDENT_AMBULATORY_CARE_PROVIDER_SITE_OTHER): Payer: Self-pay | Admitting: Surgery

## 2013-12-12 VITALS — BP 128/72 | HR 77 | Temp 98.7°F | Resp 16 | Ht 70.5 in | Wt 208.8 lb

## 2013-12-12 DIAGNOSIS — S81802A Unspecified open wound, left lower leg, initial encounter: Secondary | ICD-10-CM

## 2013-12-12 DIAGNOSIS — S81809A Unspecified open wound, unspecified lower leg, initial encounter: Secondary | ICD-10-CM

## 2013-12-12 DIAGNOSIS — S81009A Unspecified open wound, unspecified knee, initial encounter: Secondary | ICD-10-CM

## 2013-12-12 DIAGNOSIS — S91009A Unspecified open wound, unspecified ankle, initial encounter: Secondary | ICD-10-CM

## 2013-12-12 HISTORY — DX: Unspecified open wound, left lower leg, initial encounter: S81.802A

## 2013-12-12 NOTE — Progress Notes (Signed)
Subjective:     Patient ID: Brandon [name], male   DOB: 1941/10/06, 73 y.o.   MRN: [id number]  HPI Patient sent at request of Marijo File M.D. To check a left thigh wound. He had an abscess drained from his posterior left thigh about a week ago. He's been going to the clinic for wound care. He had some persistent erythema and is sent at the request of Dr. Rockwell Germany for evaluation of his wound. Denies fever or chills. Denies pain. He thinks is getting better. Packing being changed regularly.  Review of Systems  Constitutional: Positive for fever. Negative for fatigue.  Neurological: Negative.   Hematological: Negative.        Objective:   Physical Exam  Constitutional: He is oriented to person, place, and time.  Neurological: He is alert and oriented to person, place, and time.  Skin:     Psychiatric: He has a normal mood and affect. His behavior is normal. Judgment and thought content normal.       Assessment:     Left leg posterior thigh wound with history of abscess well drained    Plan:     Wound seems to be healing well. No indication for ongoing infection. Erythema may lag but clinically he is doing well. No further surgery necessary. He states he gets followed at the urgent care and is going back next week. Followup here as needed.

## 2013-12-12 NOTE — Patient Instructions (Signed)
Continue follow up with primary care. Wound looks ok at this point.  Return here if you have pain,  Fever or more drainage.

## 2013-12-13 ENCOUNTER — Encounter: Payer: Self-pay | Admitting: Physician Assistant

## 2013-12-13 ENCOUNTER — Ambulatory Visit (INDEPENDENT_AMBULATORY_CARE_PROVIDER_SITE_OTHER): Payer: Medicare HMO | Admitting: Physician Assistant

## 2013-12-13 VITALS — BP 140/88 | HR 85 | Temp 98.5°F | Resp 18 | Wt 208.0 lb

## 2013-12-13 DIAGNOSIS — L03119 Cellulitis of unspecified part of limb: Principal | ICD-10-CM

## 2013-12-13 DIAGNOSIS — L02419 Cutaneous abscess of limb, unspecified: Secondary | ICD-10-CM

## 2013-12-13 NOTE — Progress Notes (Signed)
   Subjective:    Patient ID: Brandon [name], male    DOB: [birth year], 73 y.o.   MRN: [id number]  Wound Check     Mr. [name] is a very pleasant 73 yr old male here for wound care.  Previous notes reviewed.  Pt was able to see [facility] yesterday, note reviewed.  The surgeon felt that he was healing well and required no further surgical intervention.  Pt states he continues to do well.  He now has no pain or tenderness.  He continues to tolerate the antibiotics well   Review of Systems  Constitutional: Negative for fever and chills.  Respiratory: Negative.   Cardiovascular: Negative.   Gastrointestinal: Negative.   Skin: Positive for wound.       Objective:   Physical Exam  Vitals reviewed. Constitutional: He is oriented to person, place, and time. He appears well-developed and well-nourished. No distress.  HENT:  Head: Normocephalic and atraumatic.  Eyes: Conjunctivae are normal. No scleral icterus.  Pulmonary/Chest: Effort normal.  Neurological: He is alert and oriented to person, place, and time.  Skin: Skin is warm and dry.     Healing wound at left posterior thigh; erythema continues to improve; the left leg is still swollen compared with the right but there is no tenderness; no drainage expressed from wound; wound bed appears healthy    Wound Care: Dressing and packing removed.  Packing saturated with drainage.  Moderate drainage on dressing.  Wound bed healthy.  Unable to express any further drainage.  Repacked and dressed.        Assessment & Plan:  Cellulitis and abscess of leg   Mr. Kluth is a very pleasant 73 yr old male here for wound care.  Wound continues to improve.  He has persistent erythema and swelling of the left leg, but no warmth or tenderness.  Suspect this may be dependent.  Encouraged pt to elevate leg when possible.  Continue abx.  Follow up in 48 hours  E. Natividad Brood MHS, PA-C [facility] 1/29/20158:59 PM

## 2013-12-15 ENCOUNTER — Ambulatory Visit (INDEPENDENT_AMBULATORY_CARE_PROVIDER_SITE_OTHER): Payer: Medicare HMO | Admitting: Physician Assistant

## 2013-12-15 VITALS — BP 140/80 | HR 86 | Temp 98.0°F | Resp 16

## 2013-12-15 DIAGNOSIS — L02419 Cutaneous abscess of limb, unspecified: Secondary | ICD-10-CM

## 2013-12-15 DIAGNOSIS — L03119 Cellulitis of unspecified part of limb: Principal | ICD-10-CM

## 2013-12-15 MED ORDER — DOXYCYCLINE HYCLATE 100 MG PO TABS: 100.0000 mg | ORAL_TABLET | Freq: Two times a day (BID) | ORAL | Status: DC

## 2013-12-15 NOTE — Progress Notes (Signed)
   Patient ID: Brandon [name] MRN: [id number], DOB: June 15, 1941 73 y.o. Date of Encounter: 12/15/2013, 4:24 PM  Primary Physician: Gara Kroner, MD  Chief Complaint: Wound care   See previous note  HPI: 73 y.o. male presents for wound care s/p I&D on 12/05/13 Doing well No issues or complaints Afebrile/ no chills No nausea or vomiting Tolerating doxycycline Pain resolved Daily dressing change Previous note reviewed  Past Medical History  Diagnosis Date  . Shortness of breath   . COPD (chronic obstructive pulmonary disease)   . Peripheral vascular disease   . AAA (abdominal aortic aneurysm)   . 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 Authorizing Provider  albuterol (PROVENTIL) (5 MG/ML) 0.5% nebulizer solution Take 0.5 mLs (2.5 mg total) by nebulization 3 (three) times daily. 09/22/12  Yes Ivan Anchors Love, PA-C  albuterol (PROVENTIL) (5 MG/ML) 0.5% nebulizer solution Take 0.5 mLs (2.5 mg total) by nebulization 3 (three) times daily. 09/22/12  Yes Ivan Anchors Love, PA-C  doxycycline (VIBRA-TABS) 100 MG tablet Take 1 tablet (100 mg total) by mouth 2 (two) times daily. 12/05/13  Yes Thao P Le, DO  Fluticasone-Salmeterol (ADVAIR) 250-50 MCG/DOSE AEPB Inhale 1 puff into the lungs 2 (two) times daily. 09/22/12  Yes Ivan Anchors Love, PA-C  metoprolol tartrate (LOPRESSOR) 25 MG tablet Take 1 tablet (25 mg total) by mouth 2 (two) times daily. FOR BLOOD PRESSURE 09/22/12  Yes Ivan Anchors Love, PA-C    Allergies: No Known Allergies  ROS: Constitutional: Afebrile, no chills Cardiovascular: negative for chest pain or palpitations Dermatological: Positive for wound. Negative for pain or warmth  GI: No nausea or vomiting   EXAM: Physical Exam: Blood pressure 140/80, pulse 86, temperature 98 F (36.7 C), temperature source Oral, resp. rate 16, SpO2 99.00%., There is no weight on file to  calculate BMI. General: Well developed, well nourished, in no acute distress. Nontoxic appearing. Head: Normocephalic, atraumatic, sclera non-icteric.  Neck: Supple. Lungs: Breathing is unlabored. Heart: Normal rate. Skin:  Warm and moist. Dressing and packing in place. Small amount of erythema. No induration or tenderness to palpation. Neuro: Alert and oriented X 3. Moves all extremities spontaneously. Normal gait.  Psych:  Responds to questions appropriately with a normal affect.   PROCEDURE: Dressing and packing removed. No purulence expressed Wound bed healthy Irrigated with 1% plain lidocaine 5 cc. Repacked with small amount of 1/4 inch plain packing Dressing applied  LAB: Culture: MSSA  A/P: 73 y.o. male with cellulitis/abscess as above s/p I&D on 12/05/13 -Wound care per above -Continue doxycycline -Doxycycline 100 mg 1 po bid #20 no RF -Pain resolved -Daily dressing changes -Recheck 72 hours  Signed, Christell Faith, MHS, PA-C [facility] [place] [place] [id number] [facility] 12/15/2013 4:24 PM

## 2013-12-16 ENCOUNTER — Encounter: Payer: Self-pay | Admitting: Physician Assistant

## 2013-12-18 ENCOUNTER — Ambulatory Visit (INDEPENDENT_AMBULATORY_CARE_PROVIDER_SITE_OTHER): Payer: Medicare HMO | Admitting: Physician Assistant

## 2013-12-18 VITALS — BP 140/80 | HR 70 | Temp 98.1°F | Resp 16 | Ht 70.5 in | Wt 208.2 lb

## 2013-12-18 DIAGNOSIS — L03119 Cellulitis of unspecified part of limb: Principal | ICD-10-CM

## 2013-12-18 DIAGNOSIS — L02419 Cutaneous abscess of limb, unspecified: Secondary | ICD-10-CM

## 2013-12-18 NOTE — Progress Notes (Signed)
   Subjective:    Patient ID: Brandon [name], male    DOB: 03-16-1941, 73 y.o.   MRN: [id number]  HPI Pt presents to clinic for wound care.  He is s/p I&D from 1/21.  He has not changed the drsg since we saw him on Sat 1/31.  He has no pain.  He is tolerating the abx ok - he was given an additional 10d at his last recheck.    Review of Systems  Constitutional: Negative for fever and chills.  Gastrointestinal: Negative for nausea.  Skin: Positive for wound.       Objective:   Physical Exam  Vitals reviewed. Constitutional: He is oriented to person, place, and time. He appears well-developed and well-nourished.  HENT:  Head: Normocephalic and atraumatic.  Right Ear: External ear normal.  Left Ear: External ear normal.  Pulmonary/Chest: Effort normal.  Neurological: He is alert and oriented to person, place, and time.  Skin: Skin is warm and dry.  Drsg and packing removed.  There is good granulation tissue.  Small amount of thick purulence about 4 o'clock this is easily removed with pickups.  The wound is about 1 cm deep and then circumference of the wound is a little larger than 1 cm.  Slight erythema and induration around the wound, but no TTP.  Psychiatric: He has a normal mood and affect. His behavior is normal. Judgment and thought content normal.       Assessment & Plan:  Cellulitis leg - left - appears to be healing and due to the size of the wound no packing is necessary today.  Patient will change the drsg daily which he has not been doing and wash the area with soap and water.  He feels more comfortable with one more recheck in a couple of days to just make sure everything is ok.  He will monitor the area to make sure no increased erythema occurs.  If he notices increased erythema or increased pain he will RTC sooner.  Windell Hummingbird PA-C 12/18/2013 6:26 PM

## 2013-12-22 ENCOUNTER — Ambulatory Visit (INDEPENDENT_AMBULATORY_CARE_PROVIDER_SITE_OTHER): Payer: Medicare HMO | Admitting: Physician Assistant

## 2013-12-22 VITALS — BP 130/78 | HR 80 | Temp 97.6°F | Resp 18 | Ht 70.0 in | Wt 204.2 lb

## 2013-12-22 DIAGNOSIS — L03119 Cellulitis of unspecified part of limb: Principal | ICD-10-CM

## 2013-12-22 DIAGNOSIS — L02419 Cutaneous abscess of limb, unspecified: Secondary | ICD-10-CM

## 2013-12-23 NOTE — Progress Notes (Signed)
   Subjective:    Patient ID: Brandon [name], male    DOB: May 12, 1941, 73 y.o.   MRN: [id number]  Wound Check     Brandon [name] is a very pleasant 73 yr old male here for follow up on I&D performed 12/05/13.  He continues to do very well.  He denies pain.  Minimal drainage.  Changing the dressing faithfully.  Tolerating the antibiotics well.  Doxy was extended at 12/15/13 visit.  He denies fever, chills, nausea, vomiting   Review of Systems  Constitutional: Negative for fever and chills.  Respiratory: Negative.   Cardiovascular: Negative.   Gastrointestinal: Negative.   Musculoskeletal: Negative for arthralgias and myalgias.  Skin: Positive for wound.       Objective:   Physical Exam  Vitals reviewed. Constitutional: He is oriented to person, place, and time. He appears well-developed and well-nourished. No distress.  HENT:  Head: Normocephalic and atraumatic.  Pulmonary/Chest: Effort normal.  Neurological: He is alert and oriented to person, place, and time.  Skin: Skin is warm and dry.     Healing wound at left posterior thigh; induration has resolved; small amount of erythema just around wound edges.  Non tender.  No drainage.  I removed a small amount of necrotic tissue easily with pick ups.  I have not repacked  Psychiatric: He has a normal mood and affect. His behavior is normal.        Assessment & Plan:  Cellulitis and abscess of leg   Brandon [name] is a very pleasant 73 yr old male here for follow up on I&D.  He is greatly improved.  I removed a small amount of debris with pick ups but I have not repacked the wound.  Continue daily dressing changes until completely healed.  Finish abx as directed.  RTC if concerns.  Alonza Smoker MHS, PA-C [facility] 2/8/201512:45 PM

## 2014-03-11 ENCOUNTER — Encounter: Payer: Self-pay | Admitting: Family

## 2014-03-12 ENCOUNTER — Encounter (HOSPITAL_COMMUNITY): Payer: Self-pay

## 2014-03-12 ENCOUNTER — Ambulatory Visit: Payer: Medicare Other | Admitting: Vascular Surgery

## 2014-03-12 ENCOUNTER — Ambulatory Visit: Payer: Self-pay | Admitting: Family

## 2014-03-12 ENCOUNTER — Encounter (HOSPITAL_COMMUNITY): Payer: Medicare Other

## 2014-03-12 ENCOUNTER — Ambulatory Visit (INDEPENDENT_AMBULATORY_CARE_PROVIDER_SITE_OTHER): Payer: Medicare HMO | Admitting: Family

## 2014-03-12 ENCOUNTER — Encounter: Payer: Self-pay | Admitting: Family

## 2014-03-12 ENCOUNTER — Ambulatory Visit (HOSPITAL_COMMUNITY)
Admission: RE | Admit: 2014-03-12 | Discharge: 2014-03-12 | Disposition: A | Discharge status: Home / Self Care | Payer: Medicare HMO | Source: Ambulatory Visit | Attending: Family | Admitting: Family

## 2014-03-12 VITALS — BP 148/95 | HR 64 | Resp 14 | Ht 71.5 in | Wt 208.0 lb

## 2014-03-12 DIAGNOSIS — Z48812 Encounter for surgical aftercare following surgery on the circulatory system: Secondary | ICD-10-CM

## 2014-03-12 DIAGNOSIS — I739 Peripheral vascular disease, unspecified: Secondary | ICD-10-CM | POA: Insufficient documentation

## 2014-03-12 DIAGNOSIS — I70219 Atherosclerosis of native arteries of extremities with intermittent claudication, unspecified extremity: Secondary | ICD-10-CM | POA: Insufficient documentation

## 2014-03-12 NOTE — Patient Instructions (Signed)

## 2014-03-12 NOTE — Progress Notes (Signed)
[facility]  Established Abdominal Aortic Aneurysm  History of Present Illness  Brandon [name] is a 73 y.o. (1941-03-25) male patient of Dr. Kellie Simmering who is status post repair of ruptured abdominal aortic aneurysm resection on 09/08/2012. He has chronic COPD and did require home oxygen for a period of time following discharge from the hospital. He is off of his oxygen and has not smoked a cigarette since his surgery. He can walk several blocks without difficulty and no claudication is noted. He has a good appetite and is regaining his energy level. He is driving an automobile.  He returns today for ABI's.  He remains tobacco free and happy to be so. He denies abdominal or back pain. He denies claudication symptoms in legs with walking, does have left knee pain occasionally. He denies any history of stroke or TIA.  Pt Diabetic: No Pt smoker: former smoker, quit in October, 2013  He does not take a daily ASA or statin.  Past Medical History  Diagnosis Date  . Shortness of breath   . COPD (chronic obstructive pulmonary disease)   . Peripheral vascular disease   . AAA (abdominal aortic aneurysm)   . DIC (disseminated intravascular coagulation) 09/09/2012   Past Surgical History  Procedure Laterality Date  . Abdominal aortic aneurysm repair  09/08/2012    Procedure: ANEURYSM ABDOMINAL AORTIC REPAIR;  Surgeon: [name], MD;  Location: [facility];  Service: Vascular;  Laterality: N/A;  open repair ruptured abdomenal aortic aneurysm  . Ganglion cyst excision      Right wrist  . Abdominal aortic aneurysm repair     Social History History   Social History  . Marital Status: Married    Spouse Name: N/A    Number of Children: N/A  . Years of Education: N/A   Occupational History  . Not on file.   Social History Main Topics  . Smoking status: Former Smoker -- 1.00 packs/day for 60 years    Quit date: 09/08/2012  . Smokeless tobacco: Never Used  .  Alcohol Use: No  . Drug Use: No  . Sexual Activity: Not Currently    Birth Control/ Protection: None   Other Topics Concern  . Not on file   Social History Narrative  . No narrative on file   Family History Family History  Problem Relation Age of Onset  . Dementia Mother   . Cancer Father     Current Outpatient Prescriptions on File Prior to Visit  Medication Sig Dispense Refill  . albuterol (PROVENTIL) (5 MG/ML) 0.5% nebulizer solution Take 0.5 mLs (2.5 mg total) by nebulization 3 (three) times daily.  60 vial  1  . albuterol (PROVENTIL) (5 MG/ML) 0.5% nebulizer solution Take 0.5 mLs (2.5 mg total) by nebulization 3 (three) times daily.  60 vial  1  . doxycycline (VIBRA-TABS) 100 MG tablet Take 1 tablet (100 mg total) by mouth 2 (two) times daily.  20 tablet  0  . Fluticasone-Salmeterol (ADVAIR) 250-50 MCG/DOSE AEPB Inhale 1 puff into the lungs 2 (two) times daily.  60 each  1  . metoprolol tartrate (LOPRESSOR) 25 MG tablet Take 1 tablet (25 mg total) by mouth 2 (two) times daily. FOR BLOOD PRESSURE  60 tablet  1   No current facility-administered medications on file prior to visit.   No Known Allergies  ROS: See HPI for pertinent positives and negatives.  Physical Examination  Filed Vitals:   03/12/14 1413  BP: 148/95  Pulse: 64  Resp: 14  Height: 5' 11.5" (1.816 m)  Weight: 208 lb (94.348 kg)  SpO2: 98%   Body mass index is 28.61 kg/(m^2).  General: A&O x 3, WD.  Pulmonary: Sym exp, good air movt, CTAB, no rales, rhonchi, or wheezing.  Cardiac: RRR, Nl S1, S2, no detected murmur.   Carotid Bruits Left Right   Negative Negative   Aorta is not palpable Radial pulses are 2+ palpable and =.                          VASCULAR EXAM:                                                                                                         LE Pulses LEFT RIGHT       POPLITEAL  not palpable   not palpable       POSTERIOR TIBIAL  3+ palpable   3+ palpable         DORSALIS PEDIS      ANTERIOR TIBIAL 1+ palpable  1+ palpable      Gastrointestinal: soft, NTND, -G/R, - HSM, - masses, - CVAT B.  Musculoskeletal: M/S 5/5 throughout, Extremities without ischemic changes.  Neurologic: CN 2-12 intact, Pain and light touch intact in extremities are intact, Motor exam as listed above.  Non-Invasive Vascular Imaging  ABI's (03/12/2014) Right: 1.07, Left: 1.13, both LE's with tri and biphasic waveforms at PT and DP. Previous (06/04/2013) ABI's: Right: 1.19, Left: 1.21.  Medical Decision Making  The patient is a 73 y.o. male who has normal ABI's s/p repair of ruptured abdominal aortic aneurysm resection on 09/08/2012.    Based on this patient's exam and diagnostic studies, the patient will follow up in 1 year with the following studies: ABI's.  Thank you for allowing [place] to participate in this patient's care.  Clemon Chambers, RN, MSN, FNP-C [facility] Office: [phone]  Clinic Physician: Early  03/12/2014, 2:24 PM

## 2014-03-12 NOTE — Addendum Note (Signed)
Addended by: Mena Goes on: 03/12/2014 03:43 PM   Modules accepted: Orders

## 2014-05-16 ENCOUNTER — Ambulatory Visit (INDEPENDENT_AMBULATORY_CARE_PROVIDER_SITE_OTHER): Payer: Medicare HMO | Admitting: Family Medicine

## 2014-05-16 VITALS — BP 154/75 | HR 65 | Temp 98.3°F | Resp 20 | Ht 71.5 in | Wt 209.5 lb

## 2014-05-16 DIAGNOSIS — L02419 Cutaneous abscess of limb, unspecified: Secondary | ICD-10-CM

## 2014-05-16 DIAGNOSIS — L03119 Cellulitis of unspecified part of limb: Principal | ICD-10-CM

## 2014-05-16 MED ORDER — DOXYCYCLINE HYCLATE 100 MG PO TABS: 100.0000 mg | ORAL_TABLET | Freq: Two times a day (BID) | ORAL | Status: DC

## 2014-05-16 NOTE — Patient Instructions (Signed)
We are going to treat you for your finger infection with doxycycline twice a day for 10 days.  Be careful of the sun while you are on this medication!  Wear sleeves and a hat when you are outdoors.  Let me know if your finger is not getting better

## 2014-05-16 NOTE — Progress Notes (Signed)
[facility] [address], [place] [address] [phone]  Date:  05/16/2014   Name:  Brandon [name]   DOB:  01/10/1941   MRN:  [id number]  PCP:  Gara Kroner, MD    Chief Complaint: Infected Finger   History of Present Illness:  Brandon [name] is a 73 y.o. very pleasant male patient who presents with the following:  Today is Thursday.  On Monday he noted an "infected hair" on the dorsal side of his right ring finger. No known injury He has been draining/ squeezing out pus at home and treateing with peroxide. He notes that he has more swelling and pain in his hand over the last day or so.  He has not noted a fever.  He had a similar issue on his leg earlier this year- had MSSA and did well with doxycycline.    He did have a AAA a couple of years ago and recovered fully, we actually saw him here and sent him to the ER.   Patient Active Problem List   Diagnosis Date Noted  . Peripheral vascular disease, unspecified 03/12/2014  . Atherosclerosis of native arteries of the extremities with intermittent claudication 03/12/2014  . Leg wound, left 12/12/2013  . Abdominal aneurysm without mention of rupture 12/05/2012  . Abdominal aneurysm, ruptured 10/03/2012  . Acute respiratory failure with hypoxia 09/21/2012  . Pleural effusion, bilateral 09/21/2012  . Pulmonary infiltrates on CXR 09/21/2012  . Physical deconditioning 09/19/2012  . Leucocytosis 09/19/2012  . Chronic obstructive pulmonary disease with bronchospasm 09/11/2012  . Respiratory failure, post-operative 09/09/2012  . Syncope 09/09/2012  . AAA (abdominal aortic aneurysm, ruptured) 09/09/2012  . Hypertension 09/09/2012  . Hyperglycemia 09/09/2012  . Tobacco abuse 09/08/2012    Past Medical History  Diagnosis Date  . Shortness of breath   . COPD (chronic obstructive pulmonary disease)   . Peripheral vascular disease   . AAA (abdominal aortic aneurysm)   . DIC (disseminated intravascular  coagulation) 09/09/2012    Past Surgical History  Procedure Laterality Date  . Abdominal aortic aneurysm repair  09/08/2012    Procedure: ANEURYSM ABDOMINAL AORTIC REPAIR;  Surgeon: [name], MD;  Location: [facility];  Service: Vascular;  Laterality: N/A;  open repair ruptured abdomenal aortic aneurysm  . Ganglion cyst excision      Right wrist  . Abdominal aortic aneurysm repair      History  Substance Use Topics  . Smoking status: Former Smoker -- 1.00 packs/day for 60 years    Quit date: 09/08/2012  . Smokeless tobacco: Never Used  . Alcohol Use: No    Family History  Problem Relation Age of Onset  . Dementia Mother   . Cancer Father     No Known Allergies  Medication list has been reviewed and updated.  No current outpatient prescriptions on file prior to visit.   No current facility-administered medications on file prior to visit.    Review of Systems:  As per HPI- otherwise negative.   Physical Examination: Filed Vitals:   05/16/14 1755  BP: 154/75  Pulse: 65  Temp: 98.3 F (36.8 C)  Resp: 20   Filed Vitals:   05/16/14 1755  Height: 5' 11.5" (1.816 m)  Weight: 209 lb 8 oz (95.029 kg)   Body mass index is 28.82 kg/(m^2). Ideal Body Weight: Weight in (lb) to have BMI = 25: 181.4  GEN: WDWN, NAD, Non-toxic, A & O x 3, overweight, looks well HEENT:  Atraumatic, Normocephalic. Neck supple. No masses, No LAD. Ears and Nose: No external deformity. CV: RRR, No M/G/R. No JVD. No thrill. No extra heart sounds. PULM: CTA B, no wheezes, crackles, rhonchi. No retractions. No resp. distress. No accessory muscle use. EXTR: No c/c/e NEURO Normal gait.  PSYCH: Normally interactive. Conversant. Not depressed or anxious appearing.  Calm demeanor.  Right hand:  He has cellulitis on the dorsal proximal right ring finger.  There is a small wound which shows evidence of self I and D/ squeezing.  No pus is evident at this time.  The area is red and slightly swollen.     Assessment and Plan: Cellulitis and abscess of leg - Plan: doxycycline (VIBRA-TABS) 100 MG tablet  Cellulitis of finger- treated with doxycycline as above.  Close follow-up if not improving in the next couple of days- Sooner if worse.      Signed Lamar Blinks, MD

## 2014-10-23 ENCOUNTER — Ambulatory Visit (INDEPENDENT_AMBULATORY_CARE_PROVIDER_SITE_OTHER): Payer: Medicare HMO | Admitting: Emergency Medicine

## 2014-10-23 VITALS — BP 172/77 | HR 74 | Temp 99.0°F | Resp 18 | Wt 210.0 lb

## 2014-10-23 DIAGNOSIS — M79642 Pain in left hand: Secondary | ICD-10-CM

## 2014-10-23 DIAGNOSIS — L02512 Cutaneous abscess of left hand: Secondary | ICD-10-CM

## 2014-10-23 MED ORDER — SULFAMETHOXAZOLE-TRIMETHOPRIM 800-160 MG PO TABS: 2.0000 | ORAL_TABLET | Freq: Two times a day (BID) | ORAL | Status: DC

## 2014-10-23 NOTE — Patient Instructions (Signed)

## 2014-10-23 NOTE — Progress Notes (Signed)
[facility] [address] [place] [address] [phone]  Date:  10/23/2014   Name:  Brandon [name]   DOB:  Apr 06, 1941   MRN:  [id number]  PCP:  Gara Kroner, MD    Chief Complaint: Hand Pain   History of Present Illness:  Brandon [name] is a 73 y.o. very pleasant male patient who presents with the following:  Swelling and drainage from proximal phalange left second finger No fever or chills Says he had an infected hair and a pustule and has worsened No improvement with over the counter medications or other home remedies.  Denies other complaint or health concern today.   Patient Active Problem List   Diagnosis Date Noted  . Peripheral vascular disease, unspecified 03/12/2014  . Atherosclerosis of native arteries of the extremities with intermittent claudication 03/12/2014  . Leg wound, left 12/12/2013  . Abdominal aneurysm without mention of rupture 12/05/2012  . Abdominal aneurysm, ruptured 10/03/2012  . Acute respiratory failure with hypoxia 09/21/2012  . Pleural effusion, bilateral 09/21/2012  . Pulmonary infiltrates on CXR 09/21/2012  . Physical deconditioning 09/19/2012  . Leucocytosis 09/19/2012  . Chronic obstructive pulmonary disease with bronchospasm 09/11/2012  . Respiratory failure, post-operative 09/09/2012  . Syncope 09/09/2012  . AAA (abdominal aortic aneurysm, ruptured) 09/09/2012  . Hypertension 09/09/2012  . Hyperglycemia 09/09/2012  . Tobacco abuse 09/08/2012    Past Medical History  Diagnosis Date  . Shortness of breath   . COPD (chronic obstructive pulmonary disease)   . Peripheral vascular disease   . AAA (abdominal aortic aneurysm)   . DIC (disseminated intravascular coagulation) 09/09/2012    Past Surgical History  Procedure Laterality Date  . Abdominal aortic aneurysm repair  09/08/2012    Procedure: ANEURYSM ABDOMINAL AORTIC REPAIR;  Surgeon: [name], MD;  Location: [place];  Service: Vascular;   Laterality: N/A;  open repair ruptured abdomenal aortic aneurysm  . Ganglion cyst excision      Right wrist  . Abdominal aortic aneurysm repair      History  Substance Use Topics  . Smoking status: Former Smoker -- 1.00 packs/day for 60 years    Quit date: 09/08/2012  . Smokeless tobacco: Never Used  . Alcohol Use: No    Family History  Problem Relation Age of Onset  . Dementia Mother   . Cancer Father     No Known Allergies  Medication list has been reviewed and updated.  No current outpatient prescriptions on file prior to visit.   No current facility-administered medications on file prior to visit.    Review of Systems:  As per HPI, otherwise negative.    Physical Examination: Filed Vitals:   10/23/14 1807  BP: 172/77  Pulse: 74  Temp: 99 F (37.2 C)  Resp: 18   Filed Vitals:   10/23/14 1807  Weight: 210 lb (95.255 kg)   Body mass index is 28.88 kg/(m^2). Ideal Body Weight:     GEN: WDWN, NAD, Non-toxic, Alert & Oriented x 3 HEENT: Atraumatic, Normocephalic.  Ears and Nose: No external deformity. EXTR: No clubbing/cyanosis/edema NEURO: Normal gait.  PSYCH: Normally interactive. Conversant. Not depressed or anxious appearing.  Calm demeanor.  LEFT hand  Left second finger abscess and cellulitis proximal phalange No lymphangitis   Assessment and Plan: Abscess left second finger Splint Soaks 4 x a day Septra Culture  Signed,  Ellison Carwin, MD

## 2014-10-23 NOTE — Addendum Note (Signed)
Addended by: Roselee Culver on: 10/23/2014 06:40 PM   Modules accepted: Orders

## 2014-10-26 ENCOUNTER — Ambulatory Visit (INDEPENDENT_AMBULATORY_CARE_PROVIDER_SITE_OTHER): Payer: Medicare HMO | Admitting: Physician Assistant

## 2014-10-26 VITALS — BP 118/68 | HR 82 | Temp 97.9°F | Resp 24 | Ht 71.5 in | Wt 206.5 lb

## 2014-10-26 DIAGNOSIS — I70219 Atherosclerosis of native arteries of extremities with intermittent claudication, unspecified extremity: Secondary | ICD-10-CM

## 2014-10-26 DIAGNOSIS — L02512 Cutaneous abscess of left hand: Secondary | ICD-10-CM

## 2014-10-26 LAB — WOUND CULTURE: Gram Stain: NONE SEEN

## 2014-10-26 NOTE — Patient Instructions (Signed)
Continue the antibiotic as prescribed. Continue the soaks 4 times each day, and use the splint.

## 2014-10-26 NOTE — Progress Notes (Signed)
   Subjective:    Patient ID: DETERRIUS [name], male    DOB: [birth year], 73 y.o.   MRN: [id number]   PCP: Brandon Kroner, MD  Chief Complaint  Patient presents with  . Wound Care    No Known Allergies  Patient Active Problem List   Diagnosis Date Noted  . Peripheral vascular disease, unspecified 03/12/2014  . Atherosclerosis of native arteries of the extremities with intermittent claudication 03/12/2014  . Leg wound, left 12/12/2013  . Abdominal aneurysm without mention of rupture 12/05/2012  . Abdominal aneurysm, ruptured 10/03/2012  . Acute respiratory failure with hypoxia 09/21/2012  . Pleural effusion, bilateral 09/21/2012  . Pulmonary infiltrates on CXR 09/21/2012  . Physical deconditioning 09/19/2012  . Leucocytosis 09/19/2012  . Chronic obstructive pulmonary disease with bronchospasm 09/11/2012  . Respiratory failure, post-operative 09/09/2012  . Syncope 09/09/2012  . AAA (abdominal aortic aneurysm, ruptured) 09/09/2012  . Hypertension 09/09/2012  . Hyperglycemia 09/09/2012  . Tobacco abuse 09/08/2012    Prior to Admission medications   Medication Sig Start Date End Date Taking? Authorizing Provider  sulfamethoxazole-trimethoprim (BACTRIM DS,SEPTRA DS) 800-160 MG per tablet Take 2 tablets by mouth 2 (two) times daily. 10/23/14  Yes Brandon Culver, MD   HPI  Patient presents for wound care. He was seen on 10/23/2014 for evaluation of swelling and drainage from proximal phalanx of the LEFT index finger. He was started on Septra DS, placed in a splint, and advised to remove splint and soak QID. He is tolerating the Septra well without adverse effects.  He's able to re-apply the splint independently. He has had significant improvement in his symptoms since a "plug" of green stuff came out yesterday. No fever or chills.  Review of Systems     Objective:   Physical Exam  BP 118/68 mmHg  Pulse 82  Temp(Src) 97.9 F (36.6 C) (Oral)  Resp 24  Ht 5' 11.5"  (1.816 m)  Wt 206 lb 8 oz (93.668 kg)  BMI 28.40 kg/m2  SpO2 97% WDWNWM, A&O x 3. Wound with resolving erythema. Evidence of resolving swelling and drainage. No fluctuance. No purulence expressed. Reduced tenderness.  Reduced edema of the finger and hand. Re-dressed, splint reapplied.      Assessment & Plan:  1. Abscess of finger, left Continue Septra DS and QID soaks and dressing changes. Continue use of splint. RTC on 12/14 or 12/16, whichever works best with his work schedule, to see Dr. Ouida [name] or myself.   Brandon Chute, PA-C Physician Assistant-Certified [facility]

## 2014-10-27 MED ORDER — CEPHALEXIN 500 MG PO CAPS: 500.0000 mg | ORAL_CAPSULE | Freq: Four times a day (QID) | ORAL | Status: DC

## 2014-10-27 NOTE — Addendum Note (Signed)
Addended by: Roselee Culver on: 10/27/2014 04:51 PM   Modules accepted: Orders

## 2014-10-28 ENCOUNTER — Ambulatory Visit (INDEPENDENT_AMBULATORY_CARE_PROVIDER_SITE_OTHER): Payer: Medicare HMO | Admitting: Emergency Medicine

## 2014-10-28 ENCOUNTER — Telehealth: Payer: Self-pay | Admitting: Physician Assistant

## 2014-10-28 VITALS — BP 122/88 | HR 89 | Temp 98.0°F | Resp 18

## 2014-10-28 DIAGNOSIS — I70219 Atherosclerosis of native arteries of extremities with intermittent claudication, unspecified extremity: Secondary | ICD-10-CM

## 2014-10-28 DIAGNOSIS — L02512 Cutaneous abscess of left hand: Secondary | ICD-10-CM

## 2014-10-28 NOTE — Progress Notes (Signed)
[facility] [address], [place] [address] [phone]  Date:  10/28/2014   Name:  Brandon [name]   DOB:  July 29, 1941   MRN:  [id number]  PCP:  Gara Kroner, MD    Chief Complaint: Wound Check   History of Present Illness:  Brandon [name] is a 73 y.o. very pleasant male patient who presents with the following:  Patient treated for abscess left second finger.  Culture shows resistant to septra and sensitivity to cephalosporin.   Changed med today to keflex Says scant drainage and stiffness. Less swelling Denies other complaint or health concern today.   Patient Active Problem List   Diagnosis Date Noted  . Peripheral vascular disease, unspecified 03/12/2014  . Atherosclerosis of native arteries of the extremities with intermittent claudication 03/12/2014  . Leg wound, left 12/12/2013  . Abdominal aneurysm without mention of rupture 12/05/2012  . Abdominal aneurysm, ruptured 10/03/2012  . Acute respiratory failure with hypoxia 09/21/2012  . Pleural effusion, bilateral 09/21/2012  . Pulmonary infiltrates on CXR 09/21/2012  . Physical deconditioning 09/19/2012  . Leucocytosis 09/19/2012  . Chronic obstructive pulmonary disease with bronchospasm 09/11/2012  . Respiratory failure, post-operative 09/09/2012  . Syncope 09/09/2012  . AAA (abdominal aortic aneurysm, ruptured) 09/09/2012  . Hypertension 09/09/2012  . Hyperglycemia 09/09/2012  . Tobacco abuse 09/08/2012    Past Medical History  Diagnosis Date  . Shortness of breath   . COPD (chronic obstructive pulmonary disease)   . Peripheral vascular disease   . AAA (abdominal aortic aneurysm)   . DIC (disseminated intravascular coagulation) 09/09/2012    Past Surgical History  Procedure Laterality Date  . Abdominal aortic aneurysm repair  09/08/2012    Procedure: ANEURYSM ABDOMINAL AORTIC REPAIR;  Surgeon: [name], MD;  Location: [place];  Service: Vascular;  Laterality: N/A;  open  repair ruptured abdomenal aortic aneurysm  . Ganglion cyst excision      Right wrist  . Abdominal aortic aneurysm repair      History  Substance Use Topics  . Smoking status: Former Smoker -- 1.00 packs/day for 60 years    Quit date: 09/08/2012  . Smokeless tobacco: Never Used  . Alcohol Use: No    Family History  Problem Relation Age of Onset  . Dementia Mother   . Cancer Father     No Known Allergies  Medication list has been reviewed and updated.  Current Outpatient Prescriptions on File Prior to Visit  Medication Sig Dispense Refill  . sulfamethoxazole-trimethoprim (BACTRIM DS,SEPTRA DS) 800-160 MG per tablet Take 2 tablets by mouth 2 (two) times daily. 40 tablet 0  . cephALEXin (KEFLEX) 500 MG capsule Take 1 capsule (500 mg total) by mouth 4 (four) times daily. (Patient not taking: Reported on 10/28/2014) 40 capsule 0   No current facility-administered medications on file prior to visit.    Review of Systems:  As per HPI, otherwise negative.    Physical Examination: Filed Vitals:   10/28/14 1549  BP: 122/88  Pulse: 89  Temp: 98 F (36.7 C)  Resp: 18   There were no vitals filed for this visit. There is no weight on file to calculate BMI. Ideal Body Weight:     GEN: WDWN, NAD, Non-toxic, Alert & Oriented x 3 HEENT: Atraumatic, Normocephalic.  Ears and Nose: No external deformity. EXTR: No clubbing/cyanosis/edema NEURO: Normal gait.  PSYCH: Normally interactive. Conversant. Not depressed or anxious appearing.  Calm demeanor.  Less swelling and no pain.  Resists flexion due swelling. No cellulitis  Assessment and Plan: Abscess second finger Change antibiotics Continue soaks Splint at work Follow up if new problem or worsening.  Signed,  Ellison Carwin, MD

## 2014-10-28 NOTE — Patient Instructions (Signed)

## 2014-10-28 NOTE — Telephone Encounter (Signed)
Patient states that he has a question about the infection in his finger.  [phone]

## 2014-10-29 NOTE — Telephone Encounter (Signed)
Pt states that he is concerned about the infection. He started the new medication and wants to have it rechecked on Thursday or Friday.

## 2014-10-31 ENCOUNTER — Ambulatory Visit (INDEPENDENT_AMBULATORY_CARE_PROVIDER_SITE_OTHER): Payer: Medicare Other | Admitting: Physician Assistant

## 2014-10-31 VITALS — BP 128/86 | HR 82 | Temp 97.6°F | Resp 18 | Ht 71.5 in | Wt 206.2 lb

## 2014-10-31 DIAGNOSIS — L02512 Cutaneous abscess of left hand: Secondary | ICD-10-CM

## 2014-10-31 DIAGNOSIS — I70219 Atherosclerosis of native arteries of extremities with intermittent claudication, unspecified extremity: Secondary | ICD-10-CM

## 2014-10-31 MED ORDER — MUPIROCIN 2 % EX OINT
1.0000 | TOPICAL_OINTMENT | Freq: Two times a day (BID) | CUTANEOUS | Status: DC
Start: 2014-10-31 — End: 2015-03-02

## 2014-10-31 NOTE — Patient Instructions (Signed)
Keep covered when working and open and dry during times when your hand is going to stay clean.   Use the ointment under your bandage.

## 2014-10-31 NOTE — Progress Notes (Signed)
   Subjective:    Patient ID: Brandon [name], male    DOB: 1941-11-09, 73 y.o.   MRN: [id number]  HPI Pt presents to clinic for recheck of his finger wound.  When he pulled the tape off from the wound it pulled some on the skin off.  He states the swelling has improved and there is no pain at this time.  He is taking abx as Rx and he is tolerating the keflex well.    Review of Systems  Constitutional: Negative for fever and chills.       Objective:   Physical Exam  Constitutional: He is oriented to person, place, and time. He appears well-developed and well-nourished.  BP 128/86 mmHg  Pulse 82  Temp(Src) 97.6 F (36.4 C) (Oral)  Resp 18  Ht 5' 11.5" (1.816 m)  Wt 206 lb 3.2 oz (93.532 kg)  BMI 28.36 kg/m2  SpO2 97%   HENT:  Head: Normocephalic and atraumatic.  Right Ear: External ear normal.  Left Ear: External ear normal.  Pulmonary/Chest: Effort normal.  Musculoskeletal:       Hands: Neurological: He is alert and oriented to person, place, and time.  Psychiatric: He has a normal mood and affect. His behavior is normal. Judgment and thought content normal.          Assessment & Plan:  Abscess of finger, left - Plan: mupirocin ointment (BACTROBAN) 2 %  Windell Hummingbird PA-C  [facility] 10/31/2014 7:41 PM

## 2015-03-02 ENCOUNTER — Ambulatory Visit (INDEPENDENT_AMBULATORY_CARE_PROVIDER_SITE_OTHER): Payer: Medicare HMO | Admitting: Physician Assistant

## 2015-03-02 VITALS — BP 120/74 | HR 58 | Temp 97.8°F | Resp 16 | Ht 70.0 in | Wt 209.2 lb

## 2015-03-02 DIAGNOSIS — L03114 Cellulitis of left upper limb: Secondary | ICD-10-CM | POA: Diagnosis not present

## 2015-03-02 MED ORDER — DOXYCYCLINE HYCLATE 100 MG PO TABS: 100.0000 mg | ORAL_TABLET | Freq: Two times a day (BID) | ORAL | Status: DC

## 2015-03-02 NOTE — Progress Notes (Signed)
   Subjective:    Patient ID: Brandon [name], male    DOB: 13-Jan-1941, 74 y.o.   MRN: [id number]  HPI  Pt presents to clinic with an area on his left forearm that has been red and swollen for about 5 days and then yesterday it started to drain.  It started as a red bump that was itchy and then it started to get bigger.  He has done nothing for the area but kept it covered.  He has had multiple skin infections but they have always been MSSA.  Review of Systems  Constitutional: Negative for fever and chills.  Skin: Positive for wound.       Objective:   Physical Exam  Constitutional: He is oriented to person, place, and time. He appears well-developed and well-nourished.  BP 120/74 mmHg  Pulse 58  Temp(Src) 97.8 F (36.6 C) (Oral)  Resp 16  Ht 5\' 10"  (1.778 m)  Wt 209 lb 4 oz (94.915 kg)  BMI 30.02 kg/m2  SpO2 97%   HENT:  Head: Normocephalic and atraumatic.  Right Ear: External ear normal.  Left Ear: External ear normal.  Cardiovascular: Normal rate, regular rhythm and normal heart sounds.   No murmur heard. Pulmonary/Chest: Effort normal and breath sounds normal. He has no wheezes.  Neurological: He is alert and oriented to person, place, and time.  Skin: Skin is warm and dry. There is erythema.     Psychiatric: He has a normal mood and affect. His behavior is normal. Judgment and thought content normal.   Procedure:  Consent obtained.  Scab removed and purulence expressed that was sent for culture.  Necrotic tissue was removed and drsg with bactroban was placed.      Assessment & Plan:  Cellulitis of left arm - Plan: doxycycline (VIBRA-TABS) 100 MG tablet, Wound culture   H/o multiple skin infections - he will use bactroban and drsg daily until the wound is healed and we will start oral abx.  Heat will help.  Windell Hummingbird PA-C  [facility] 03/02/2015 3:34 PM

## 2015-03-05 LAB — WOUND CULTURE: Gram Stain: NONE SEEN

## 2015-03-18 ENCOUNTER — Encounter (HOSPITAL_COMMUNITY): Payer: Medicare Other

## 2015-03-18 ENCOUNTER — Ambulatory Visit: Payer: Medicare Other | Admitting: Family

## 2015-03-21 ENCOUNTER — Encounter: Payer: Self-pay | Admitting: Family

## 2015-03-25 ENCOUNTER — Ambulatory Visit (INDEPENDENT_AMBULATORY_CARE_PROVIDER_SITE_OTHER): Payer: Medicare HMO | Admitting: Vascular Surgery

## 2015-03-25 ENCOUNTER — Ambulatory Visit (HOSPITAL_COMMUNITY)
Admission: RE | Admit: 2015-03-25 | Discharge: 2015-03-25 | Disposition: A | Discharge status: Home / Self Care | Payer: Medicare HMO | Source: Ambulatory Visit | Attending: Vascular Surgery | Admitting: Vascular Surgery

## 2015-03-25 ENCOUNTER — Encounter: Payer: Self-pay | Admitting: Vascular Surgery

## 2015-03-25 VITALS — BP 160/99 | HR 64 | Ht 70.0 in | Wt 209.4 lb

## 2015-03-25 DIAGNOSIS — I713 Abdominal aortic aneurysm, ruptured, unspecified: Secondary | ICD-10-CM

## 2015-03-25 DIAGNOSIS — I714 Abdominal aortic aneurysm, without rupture: Secondary | ICD-10-CM | POA: Diagnosis not present

## 2015-03-25 DIAGNOSIS — I739 Peripheral vascular disease, unspecified: Secondary | ICD-10-CM

## 2015-03-25 DIAGNOSIS — I70219 Atherosclerosis of native arteries of extremities with intermittent claudication, unspecified extremity: Secondary | ICD-10-CM | POA: Diagnosis present

## 2015-03-25 NOTE — Progress Notes (Signed)
Subjective:     Patient ID: Brandon [name], male   DOB: Jan 04, 1941, 74 y.o.   MRN: [id number]  HPI this 74 year old male returns for continued follow-up regarding his ruptured abdominal aortic aneurysm which was treated emergently in October 2013. He had aorta to right external iliac and left common iliac bypass with thrombectomy of both lower extremities. He has done well since that time. He quit smoking following his surgery. He denies any claudication symptoms.  Past Medical History  Diagnosis Date  . Shortness of breath   . COPD (chronic obstructive pulmonary disease)   . Peripheral vascular disease   . AAA (abdominal aortic aneurysm)   . DIC (disseminated intravascular coagulation) 09/09/2012    History  Substance Use Topics  . Smoking status: Former Smoker -- 1.00 packs/day for 60 years    Quit date: 09/08/2012  . Smokeless tobacco: Never Used  . Alcohol Use: No    Family History  Problem Relation Age of Onset  . Dementia Mother   . Cancer Mother   . Cancer Father   . Heart disease Father     No Known Allergies   Current outpatient prescriptions:  .  doxycycline (VIBRA-TABS) 100 MG tablet, Take 1 tablet (100 mg total) by mouth 2 (two) times daily. (Patient not taking: Reported on 03/25/2015), Disp: 20 tablet, Rfl: 0  Filed Vitals:   03/25/15 1321  BP: 160/99  Pulse: 64  Height: '5\' 10"'$  (1.778 m)  Weight: 209 lb 6.4 oz (94.983 kg)  SpO2: 96%    Body mass index is 30.05 kg/(m^2).           Review of Systems denies chest pain, dyspnea on exertion, PND, orthopnea, wheezing. Patient quit smoking 2-1/2 years ago.     Objective:   Physical Exam BP 160/99 mmHg  Pulse 64  Ht '5\' 10"'$  (1.778 m)  Wt 209 lb 6.4 oz (94.983 kg)  BMI 30.05 kg/m2  SpO2 96%  Gen.-alert and oriented x3 in no apparent distress HEENT normal for age Lungs no rhonchi or wheezing Cardiovascular regular rhythm no murmurs carotid pulses 3+ palpable no bruits audible Abdomen soft  nontender no palpable masses-obese-no evidence of ventral hernia  Musculoskeletal free of  major deformities Skin clear -no rashes Neurologic normal Lower extremities 3+ femoral and posterior tibial pulses palpable bilaterally with no edema  Today I ordered ABIs which I reviewed and interpreted. ABIs exceed 1 bilaterally with triphasic flow.       Assessment:     2-1/2 years status post ruptured abdominal aortic aneurysm with aortobiiliac graft and thrombectomy both lower extremities-doing well Patient quit smoking 2-1/2 years ago COPD doing well since off tobacco products    Plan:     Return in 1 year for physical exam and check ABIs Begin aspirin 81 mg daily

## 2015-03-25 NOTE — Addendum Note (Signed)
Addended by: Reola Calkins on: 03/25/2015 03:33 PM   Modules accepted: Orders

## 2015-03-31 ENCOUNTER — Ambulatory Visit: Payer: Medicare Other | Admitting: Family

## 2015-03-31 ENCOUNTER — Encounter (HOSPITAL_COMMUNITY): Payer: Medicare Other

## 2015-12-05 ENCOUNTER — Ambulatory Visit (INDEPENDENT_AMBULATORY_CARE_PROVIDER_SITE_OTHER): Payer: Medicare HMO | Admitting: Physician Assistant

## 2015-12-05 VITALS — BP 146/88 | HR 96 | Temp 98.3°F | Resp 20 | Ht 71.5 in | Wt 224.8 lb

## 2015-12-05 DIAGNOSIS — W293XXA Contact with powered garden and outdoor hand tools and machinery, initial encounter: Secondary | ICD-10-CM

## 2015-12-05 DIAGNOSIS — I10 Essential (primary) hypertension: Secondary | ICD-10-CM

## 2015-12-05 DIAGNOSIS — I713 Abdominal aortic aneurysm, ruptured, unspecified: Secondary | ICD-10-CM

## 2015-12-05 DIAGNOSIS — Z23 Encounter for immunization: Secondary | ICD-10-CM | POA: Diagnosis not present

## 2015-12-05 MED ORDER — LISINOPRIL 10 MG PO TABS: 10.0000 mg | ORAL_TABLET | Freq: Every day | ORAL | Status: DC

## 2015-12-05 NOTE — Progress Notes (Signed)
[facility] [address], [place] [address] [phone]  Date:  12/05/2015   Name:  Brandon [name]   DOB:  10/02/1941   MRN:  [id number]  PCP:  Gara Kroner, MD    Chief Complaint: Hypertension   History of Present Illness:  This is a 75 y.o. male with PMH ruptured AAA who is presenting for follow up.  Pt states his insurance company sent a nurse to his house for a check up yesterday. She told him his BP was very high - he thinks "180 over something" - and told him to get it checked out. He states he has never been on anything for BP. He does not have a monitor at home. He doesn't not come in to the doctor very often. He reports his PCP is Dr. Moreen Fowler at [facility] but states he only saw him once about 3 years ago. It has been a while since he had blood work.  Pt had a ruptured AAA in 2013. Had it repaired by Dr. Tinnie Gens. Last saw him 03/25/15 - BP at that time 160/99. He has also had normal BPs in the past around 120s/70-80s. Pt takes an aspirin 81 mg once a day. Pt used to smoke, he quit after the AAA.   Wants pneumonia vaccine and tdap today. Declines flu.  He still works. Works in Scient[employer], [employer].  Review of Systems:  Review of Systems See HPI  Patient Active Problem List   Diagnosis Date Noted  . Peripheral vascular disease, unspecified ([place]) 03/12/2014  . Atherosclerosis of native arteries of the extremities with intermittent claudication 03/12/2014  . Physical deconditioning 09/19/2012  . Chronic obstructive pulmonary disease with bronchospasm ([place]) 09/11/2012  . AAA (abdominal aortic aneurysm, ruptured) ([place]) 09/09/2012  . Hyperglycemia 09/09/2012  . Former tobacco use 09/08/2012    Prior to Admission medications   Medication Sig Start Date End Date Taking? Authorizing Provider  doxycycline (VIBRA-TABS) 100 MG tablet Take 1 tablet (100 mg total) by mouth 2 (two) times daily. Patient not taking: Reported on 12/05/2015  03/02/15   Mancel Bale, PA-C    No Known Allergies  Past Surgical History  Procedure Laterality Date  . Abdominal aortic aneurysm repair  09/08/2012    Procedure: ANEURYSM ABDOMINAL AORTIC REPAIR;  Surgeon: [name], MD;  Location: [facility];  Service: Vascular;  Laterality: N/A;  open repair ruptured abdomenal aortic aneurysm  . Ganglion cyst excision      Right wrist  . Abdominal aortic aneurysm repair      Social History  Substance Use Topics  . Smoking status: Former Smoker -- 1.00 packs/day for 60 years    Quit date: 09/08/2012  . Smokeless tobacco: Never Used  . Alcohol Use: No    Family History  Problem Relation Age of Onset  . Dementia Mother   . Cancer Mother   . Cancer Father   . Heart disease Father     Medication list has been reviewed and updated.  Physical Examination:  Physical Exam  Constitutional: He is oriented to person, place, and time. He appears well-developed and well-nourished. No distress.  HENT:  Head: Normocephalic and atraumatic.  Right Ear: Hearing normal.  Left Ear: Hearing normal.  Nose: Nose normal.  Mouth/Throat: Uvula is midline, oropharynx is clear and moist and mucous membranes are normal.  Eyes: Conjunctivae and lids are normal. Right eye exhibits no discharge. Left eye exhibits no discharge. No scleral icterus.  Neck: Trachea normal. Carotid bruit is not present. No thyromegaly present.  Cardiovascular: Normal rate, regular rhythm, normal heart sounds and normal pulses.   No murmur heard. Pulmonary/Chest: Effort normal and breath sounds normal. No respiratory distress. He has no wheezes. He has no rhonchi. He has no rales.  Musculoskeletal: Normal range of motion.  Lymphadenopathy:       Head (right side): No submental, no submandibular and no tonsillar adenopathy present.       Head (left side): No submental, no submandibular and no tonsillar adenopathy present.    He has no cervical adenopathy.  Neurological: He is alert  and oriented to person, place, and time.  Skin: Skin is warm, dry and intact. No lesion and no rash noted.  No LE edema  Psychiatric: He has a normal mood and affect. His speech is normal and behavior is normal. Thought content normal.   BP 146/88 mmHg  Pulse 96  Temp(Src) 98.3 F (36.8 C) (Oral)  Resp 20  Ht 5' 11.5" (1.816 m)  Wt 224 lb 12.8 oz (101.969 kg)  BMI 30.92 kg/m2  SpO2 96%  Assessment and Plan:  1. Essential hypertension BP not terribly high in office. Reading by nurse yesterday high and I can see it has been high in the past. I think with history of AAA, starting on a bp medicine would not be a bad idea. Start on lisinopril 10 mg QD. Discussed risks/benefits. Return in 2-3 months for follow up. - lisinopril (PRINIVIL,ZESTRIL) 10 MG tablet; Take 1 tablet (10 mg total) by mouth daily.  Dispense: 30 tablet; Refill: 11  2. AAA (abdominal aortic aneurysm, ruptured) ([place]) Labs pending. Continue aspirin daily. Follow up with Dr. Kellie Simmering. - Comprehensive metabolic panel - CBC - Lipid panel  3. Need for vaccination for Strep pneumoniae - Pneumococcal conjugate vaccine 13-valent IM  4. Contact with powered garden and outdoor hand tools and machinery, initial encounter - Tdap vaccine greater than or equal to 7yo IM   [employer]. Drenda Freeze, MHS [facility]  12/05/2015

## 2015-12-05 NOTE — Patient Instructions (Addendum)
Keep taking your baby aspirin daily. Start taking lisinopril daily. If you develop side effects, let me know. I will call you with your lab results. Come back to see me in 2-3 months to follow up.

## 2015-12-06 LAB — CBC
HCT: 46 % (ref 39.0–52.0)
Hemoglobin: 15.7 g/dL (ref 13.0–17.0)
MCH: 31.9 pg (ref 26.0–34.0)
MCHC: 34.1 g/dL (ref 30.0–36.0)
MCV: 93.5 fL (ref 78.0–100.0)
MPV: 10.1 fL (ref 8.6–12.4)
Platelets: 251 10*3/uL (ref 150–400)
RBC: 4.92 MIL/uL (ref 4.22–5.81)
RDW: 13.1 % (ref 11.5–15.5)
WBC: 8.2 10*3/uL (ref 4.0–10.5)

## 2015-12-06 LAB — COMPREHENSIVE METABOLIC PANEL
ALT: 17 U/L (ref 9–46)
AST: 16 U/L (ref 10–35)
Albumin: 4.1 g/dL (ref 3.6–5.1)
Alkaline Phosphatase: 50 U/L (ref 40–115)
BUN: 13 mg/dL (ref 7–25)
CO2: 27 mmol/L (ref 20–31)
Calcium: 9.2 mg/dL (ref 8.6–10.3)
Chloride: 103 mmol/L (ref 98–110)
Creat: 0.82 mg/dL (ref 0.70–1.18)
Glucose, Bld: 85 mg/dL (ref 65–99)
Potassium: 4.4 mmol/L (ref 3.5–5.3)
Sodium: 139 mmol/L (ref 135–146)
Total Bilirubin: 0.5 mg/dL (ref 0.2–1.2)
Total Protein: 6.4 g/dL (ref 6.1–8.1)

## 2015-12-06 LAB — LIPID PANEL
Cholesterol: 192 mg/dL (ref 125–200)
HDL: 41 mg/dL (ref >=40)
LDL Cholesterol: 118 mg/dL (ref <130)
Total CHOL/HDL Ratio: 4.7 Ratio (ref <=5.0)
Triglycerides: 164 mg/dL — ABNORMAL HIGH (ref <150)
VLDL: 33 mg/dL — ABNORMAL HIGH (ref <30)

## 2016-03-07 ENCOUNTER — Ambulatory Visit (INDEPENDENT_AMBULATORY_CARE_PROVIDER_SITE_OTHER): Payer: Medicare HMO | Admitting: Emergency Medicine

## 2016-03-07 VITALS — BP 118/74 | HR 67 | Temp 98.1°F | Resp 18 | Ht 71.0 in | Wt 220.0 lb

## 2016-03-07 DIAGNOSIS — L03116 Cellulitis of left lower limb: Secondary | ICD-10-CM

## 2016-03-07 LAB — POCT CBC
Granulocyte percent: 76.6 %G (ref 37–80)
HCT, POC: 43 % — AB (ref 43.5–53.7)
Hemoglobin: 15.3 g/dL (ref 14.1–18.1)
Lymph, poc: 2 (ref 0.6–3.4)
MCH, POC: 33 pg — AB (ref 27–31.2)
MCHC: 35.6 g/dL — AB (ref 31.8–35.4)
MCV: 92.9 fL (ref 80–97)
MID (cbc): 1 — AB (ref 0–0.9)
MPV: 7.9 fL (ref 0–99.8)
POC Granulocyte: 9.8 — AB (ref 2–6.9)
POC LYMPH PERCENT: 15.3 %L (ref 10–50)
POC MID %: 8.1 %M (ref 0–12)
Platelet Count, POC: 215 10*3/uL (ref 142–424)
RBC: 4.62 M/uL — AB (ref 4.69–6.13)
RDW, POC: 13.4 %
WBC: 12.8 10*3/uL — AB (ref 4.6–10.2)

## 2016-03-07 LAB — GLUCOSE, POCT (MANUAL RESULT ENTRY): POC Glucose: 87 mg/dl (ref 70–99)

## 2016-03-07 MED ORDER — CEPHALEXIN 500 MG PO CAPS: 500.0000 mg | ORAL_CAPSULE | Freq: Three times a day (TID) | ORAL | Status: DC

## 2016-03-07 MED ORDER — CEFTRIAXONE SODIUM 1 G IJ SOLR
1.0000 g | Freq: Once | INTRAMUSCULAR | Status: AC
  Administered 2016-03-07: 1 g via INTRAMUSCULAR

## 2016-03-07 NOTE — Addendum Note (Signed)
Addended by: Arlyss Queen A on: 03/07/2016 02:53 PM   Modules accepted: Orders

## 2016-03-07 NOTE — Progress Notes (Signed)
By signing my name below, I, Raven Small, attest that this documentation has been prepared under the direction and in the presence of Nena Jordan, MD.  Electronically Signed: Thea Alken, ED Scribe. 03/07/2016. 1:10 PM.  Chief Complaint:  Chief Complaint  Patient presents with  . Mass    lower left leg, x 1 week    HPI: Brandon [name] is a 75 y.o. male who reports to [facility] today complaining of a sore on left lower leg. Pt states sore initially started as a bump, the size of an eraser on a lead pencil. He drained bump himself extracting pus drainage. He has been cleaning area with peroxide, neosporin and covering it with bandages but over time bump began to gradually grow. Over the past few days surrounding area has turned red with worsening symptoms of pain and drainage this morning. He reports increased warmth to area last night. Pt reports similar skin infection in the past. Pt takes a baby aspirin a day. Pt denies fever and chills.   Past Medical History  Diagnosis Date  . Shortness of breath   . COPD (chronic obstructive pulmonary disease) ([place])   . Peripheral vascular disease ([place])   . AAA (abdominal aortic aneurysm) ([place])   . DIC (disseminated intravascular coagulation) ([place]) 09/09/2012  . Leg wound, left 12/12/2013  . Respiratory failure, post-operative ([place]) 09/09/2012   Past Surgical History  Procedure Laterality Date  . Abdominal aortic aneurysm repair  09/08/2012    Procedure: ANEURYSM ABDOMINAL AORTIC REPAIR;  Surgeon: [name], MD;  Location: [facility];  Service: Vascular;  Laterality: N/A;  open repair ruptured abdomenal aortic aneurysm  . Ganglion cyst excision      Right wrist  . Abdominal aortic aneurysm repair     Social History   Social History  . Marital Status: Married    Spouse Name: N/A  . Number of Children: N/A  . Years of Education: N/A   Social History Main Topics  . Smoking status: Former Smoker -- 1.00 packs/day for 60 years    Quit date:  09/08/2012  . Smokeless tobacco: Never Used  . Alcohol Use: No  . Drug Use: No  . Sexual Activity: Not Currently    Birth Control/ Protection: None   Other Topics Concern  . None   Social History Narrative   Family History  Problem Relation Age of Onset  . Dementia Mother   . Cancer Mother   . Cancer Father   . Heart disease Father    No Known Allergies Prior to Admission medications   Medication Sig Start Date End Date Taking? Authorizing Provider  aspirin 81 MG tablet Take 81 mg by mouth daily.   Yes Historical Provider, MD  lisinopril (PRINIVIL,ZESTRIL) 10 MG tablet Take 1 tablet (10 mg total) by mouth daily. 12/05/15  Yes Bennett Scrape V, PA-C     ROS: The patient denies fevers, chills, night sweats, unintentional weight loss, chest pain, palpitations, wheezing, dyspnea on exertion, nausea, vomiting, abdominal pain, dysuria, hematuria, melena, numbness, weakness, or tingling.   All other systems have been reviewed and were otherwise negative with the exception of those mentioned in the HPI and as above.    PHYSICAL EXAM: Filed Vitals:   03/07/16 1258  BP: 118/74  Pulse: 67  Temp: 98.1 F (36.7 C)  Resp: 18   Body mass index is 30.7 kg/(m^2).   General: Alert, no acute distress HEENT:  Normocephalic, atraumatic, oropharynx patent. Eye: Juliette Mangle [facility] Cardiovascular:  Regular rate and rhythm, no rubs murmurs or gallops.  No Carotid bruits, radial pulse intact. No pedal edema.  Respiratory: Clear to auscultation bilaterally.  No wheezes, rales, or rhonchi.  No cyanosis, no use of accessory musculature Abdominal: No organomegaly, abdomen is soft and non-tender, positive bowel sounds.  No masses. Musculoskeletal: Gait intact. No edema, tenderness Skin:  6x7 cm area left mid shin with a 1x1.5 cm central area. Bluish discolored .  Neurologic: Facial musculature symmetric. Psychiatric: Patient acts appropriately throughout our interaction. Lymphatic: No cervical or  submandibular lymphadenopathy  LABS: Results for orders placed or performed in visit on 03/07/16  POCT CBC  Result Value Ref Range   WBC 12.8 (A) 4.6 - 10.2 K/uL   Lymph, poc 2.0 0.6 - 3.4   POC LYMPH PERCENT 15.3 10 - 50 %L   MID (cbc) 1.0 (A) 0 - 0.9   POC MID % 8.1 0 - 12 %M   POC Granulocyte 9.8 (A) 2 - 6.9   Granulocyte percent 76.6 37 - 80 %G   RBC 4.62 (A) 4.69 - 6.13 M/uL   Hemoglobin 15.3 14.1 - 18.1 g/dL   HCT, POC 43.0 (A) 43.5 - 53.7 %   MCV 92.9 80 - 97 fL   MCH, POC 33.0 (A) 27 - 31.2 pg   MCHC 35.6 (A) 31.8 - 35.4 g/dL   RDW, POC 13.4 %   Platelet Count, POC 215 142 - 424 K/uL   MPV 7.9 0 - 99.8 fL  POCT glucose (manual entry)  Result Value Ref Range   POC Glucose 87 70 - 99 mg/dl    ASSESSMENT/PLAN: White count 12,800. Patient given 1 g Rocephin and will be treated with cephalexin 500  3 times a day. He has a history of MSSA. Recheck Tuesday if not significantly better.I personally performed the services described in this documentation, which was scribed in my presence. The recorded information has been reviewed and is accurate.   Gross sideeffects, risk and benefits, and alternatives of medications d/w patient. Patient is aware that all medications have potential sideeffects and we are unable to predict every sideeffect or drug-drug interaction that may occur.  Arlyss Queen MD 03/07/2016 1:03 PM

## 2016-03-07 NOTE — Patient Instructions (Addendum)
Recheck Tuesday if your leg is not doing significantly better.    IF you received an x-ray today, you will receive an invoice from [facility]. Please contact [facility] at [phone] with questions or concerns regarding your invoice.   IF you received labwork today, you will receive an invoice from [employer]. Please contact [facility] at [phone] with questions or concerns regarding your invoice.   Our billing staff will not be able to assist you with questions regarding bills from these companies.  You will be contacted with the lab results as soon as they are available. The fastest way to get your results is to activate your My Chart account. Instructions are located on the last page of this paperwork. If you have not heard from [place] regarding the results in 2 weeks, please contact this office.     Cellulitis Cellulitis is an infection of the skin and the tissue beneath it. The infected area is usually red and tender. Cellulitis occurs most often in the arms and lower legs.  CAUSES  Cellulitis is caused by bacteria that enter the skin through cracks or cuts in the skin. The most common types of bacteria that cause cellulitis are staphylococci and streptococci. SIGNS AND SYMPTOMS   Redness and warmth.  Swelling.  Tenderness or pain.  Fever. DIAGNOSIS  Your health care provider can usually determine what is wrong based on a physical exam. Blood tests may also be done. TREATMENT  Treatment usually involves taking an antibiotic medicine. HOME CARE INSTRUCTIONS   Take your antibiotic medicine as directed by your health care provider. Finish the antibiotic even if you start to feel better.  Keep the infected arm or leg elevated to reduce swelling.  Apply a warm cloth to the affected area up to 4 times per day to relieve pain.  Take medicines only as directed by your health care provider.  Keep all follow-up visits as directed by  your health care provider. SEEK MEDICAL CARE IF:   You notice red streaks coming from the infected area.  Your red area gets larger or turns dark in color.  Your bone or joint underneath the infected area becomes painful after the skin has healed.  Your infection returns in the same area or another area.  You notice a swollen bump in the infected area.  You develop new symptoms.  You have a fever. SEEK IMMEDIATE MEDICAL CARE IF:   You feel very sleepy.  You develop vomiting or diarrhea.  You have a general ill feeling (malaise) with muscle aches and pains.   This information is not intended to replace advice given to you by your health care provider. Make sure you discuss any questions you have with your health care provider.   Document Released: 08/11/2005 Document Revised: 07/23/2015 Document Reviewed: 01/17/2012 Elsevier Interactive Patient Education [employer].

## 2016-03-10 LAB — WOUND CULTURE: Gram Stain: NONE SEEN

## 2016-03-25 ENCOUNTER — Encounter: Payer: Self-pay | Admitting: Vascular Surgery

## 2016-03-30 ENCOUNTER — Ambulatory Visit (INDEPENDENT_AMBULATORY_CARE_PROVIDER_SITE_OTHER): Payer: Medicare HMO | Admitting: Vascular Surgery

## 2016-03-30 ENCOUNTER — Ambulatory Visit (HOSPITAL_COMMUNITY)
Admission: RE | Admit: 2016-03-30 | Discharge: 2016-03-30 | Disposition: A | Discharge status: Home / Self Care | Payer: Medicare HMO | Source: Ambulatory Visit | Attending: Vascular Surgery | Admitting: Vascular Surgery

## 2016-03-30 VITALS — BP 149/89 | HR 55 | Temp 96.9°F | Resp 18 | Ht 71.5 in | Wt 221.0 lb

## 2016-03-30 DIAGNOSIS — I713 Abdominal aortic aneurysm, ruptured, unspecified: Secondary | ICD-10-CM

## 2016-03-30 DIAGNOSIS — I739 Peripheral vascular disease, unspecified: Secondary | ICD-10-CM | POA: Diagnosis not present

## 2016-03-30 DIAGNOSIS — R0989 Other specified symptoms and signs involving the circulatory and respiratory systems: Secondary | ICD-10-CM | POA: Diagnosis present

## 2016-03-30 NOTE — Progress Notes (Signed)
Subjective:     Patient ID: Brandon [name], male   DOB: 09-14-1941, 75 y.o.   MRN: [id number]  HPI this 75 year old male returns for continued follow-up regarding his ruptured abdominal aortic aneurysm requiring emergency surgery in October 2013. He required aorto to bilateral common iliac bypass with extensive thrombectomy of both lower extremities. He has done quite well since his surgery with no specific complaints. He did quit smoking at the time of surgery after having smoked his entire life. He's had no history of coronary artery disease. He denies lateralizing weakness, aphasia, amaurosis fugax, diplopia, blurred vision, sync be, or claudication. He takes one aspirin per day.  Past Medical History  Diagnosis Date  . Shortness of breath   . COPD (chronic obstructive pulmonary disease) ([place])   . Peripheral vascular disease ([place])   . AAA (abdominal aortic aneurysm) ([place])   . DIC (disseminated intravascular coagulation) ([place]) 09/09/2012  . Leg wound, left 12/12/2013  . Respiratory failure, post-operative ([place]) 09/09/2012    Social History  Substance Use Topics  . Smoking status: Former Smoker -- 1.00 packs/day for 60 years    Quit date: 09/08/2012  . Smokeless tobacco: Never Used  . Alcohol Use: No    Family History  Problem Relation Age of Onset  . Dementia Mother   . Cancer Mother   . Cancer Father   . Heart disease Father     No Known Allergies   Current outpatient prescriptions:  .  aspirin 81 MG tablet, Take 81 mg by mouth daily., Disp: , Rfl:  .  lisinopril (PRINIVIL,ZESTRIL) 10 MG tablet, Take 1 tablet (10 mg total) by mouth daily., Disp: 30 tablet, Rfl: 11 .  cephALEXin (KEFLEX) 500 MG capsule, Take 1 capsule (500 mg total) by mouth 3 (three) times daily. (Patient not taking: Reported on 03/30/2016), Disp: 30 capsule, Rfl: 0  Filed Vitals:   03/30/16 0918 03/30/16 0919  BP: 157/90 149/89  Pulse: 60 55  Temp: 96.9 F (36.1 C)   Resp: 18   Height: 5' 11.5" (1.816  m)   Weight: 221 lb (100.245 kg)   SpO2: 98%     Body mass index is 30.4 kg/(m^2).           Review of Systems denies chest pain, dyspnea on exertion, PND, orthopnea, wheezing. Has mild bulge in his abdominal incision throughout. No specific complaints and complete review of systems     Objective:   Physical Exam BP 149/89 mmHg  Pulse 55  Temp(Src) 96.9 F (36.1 C)  Resp 18  Ht 5' 11.5" (1.816 m)  Wt 221 lb (100.245 kg)  BMI 30.40 kg/m2  SpO2 98%    Gen.-alert and oriented x3 in no apparent distress HEENT normal for age Lungs no rhonchi or wheezing Cardiovascular regular rhythm no murmurs carotid pulses 3+ palpable no bruits audible Abdomen soft nontender no palpable masses-diffuse diastases recti-no specific focal ventral hernia noted Musculoskeletal free of  major deformities Skin clear -no rashes Neurologic normal Lower extremities 3+ femoral and posterior tibial pulses palpable bilaterally with no edema  Today I ordered ABIs chart reviewed and interpreted. He has triphasic flow in both feet with ABIs exceeding 1.       Assessment:     3 and half years post emergent surgery for ruptured abdominal aortic aneurysm with insertion aorto by common iliac graft extensive bilateral lower extremity thrombectomy doing well Long history of tobacco abuse in the past-no smoking since his surgery     Plan:  Return in 1 year with repeat ABIs and see Dr. Servando Snare

## 2016-03-30 NOTE — Progress Notes (Signed)
Filed Vitals:   03/30/16 0918 03/30/16 0919  BP: 157/90 149/89  Pulse: 60 55  Temp: 96.9 F (36.1 C)   Resp: 18   Height: 5' 11.5" (1.816 m)   Weight: 221 lb (100.245 kg)   SpO2: 98%

## 2016-04-08 ENCOUNTER — Ambulatory Visit (INDEPENDENT_AMBULATORY_CARE_PROVIDER_SITE_OTHER): Payer: Medicare HMO | Admitting: Urgent Care

## 2016-04-08 ENCOUNTER — Ambulatory Visit (INDEPENDENT_AMBULATORY_CARE_PROVIDER_SITE_OTHER): Payer: Medicare HMO

## 2016-04-08 VITALS — BP 128/70 | HR 84 | Temp 98.2°F | Resp 18 | Ht 71.5 in | Wt 218.0 lb

## 2016-04-08 DIAGNOSIS — R062 Wheezing: Secondary | ICD-10-CM

## 2016-04-08 DIAGNOSIS — J029 Acute pharyngitis, unspecified: Secondary | ICD-10-CM

## 2016-04-08 DIAGNOSIS — R05 Cough: Secondary | ICD-10-CM

## 2016-04-08 DIAGNOSIS — R52 Pain, unspecified: Secondary | ICD-10-CM

## 2016-04-08 DIAGNOSIS — R059 Cough, unspecified: Secondary | ICD-10-CM

## 2016-04-08 LAB — POCT CBC
Granulocyte percent: 87.6 %G — AB (ref 37–80)
HCT, POC: 43.9 % (ref 43.5–53.7)
Hemoglobin: 15.4 g/dL (ref 14.1–18.1)
Lymph, poc: 1.3 (ref 0.6–3.4)
MCH, POC: 32.5 pg — AB (ref 27–31.2)
MCHC: 35 g/dL (ref 31.8–35.4)
MCV: 92.7 fL (ref 80–97)
MID (cbc): 0.7 (ref 0–0.9)
MPV: 8.2 fL (ref 0–99.8)
POC Granulocyte: 14.1 — AB (ref 2–6.9)
POC LYMPH PERCENT: 8 %L — AB (ref 10–50)
POC MID %: 4.4 %M (ref 0–12)
Platelet Count, POC: 200 10*3/uL (ref 142–424)
RBC: 4.73 M/uL (ref 4.69–6.13)
RDW, POC: 13.5 %
WBC: 16.1 10*3/uL — AB (ref 4.6–10.2)

## 2016-04-08 MED ORDER — AZITHROMYCIN 250 MG PO TABS: ORAL_TABLET | ORAL | Status: DC

## 2016-04-08 MED ORDER — BENZONATATE 100 MG PO CAPS
100.0000 mg | ORAL_CAPSULE | Freq: Three times a day (TID) | ORAL | Status: DC | PRN
Start: 2016-04-08 — End: 2016-05-01

## 2016-04-08 MED ORDER — HYDROCODONE-HOMATROPINE 5-1.5 MG/5ML PO SYRP: 5.0000 mL | ORAL_SOLUTION | Freq: Every evening | ORAL | Status: DC | PRN

## 2016-04-08 NOTE — Progress Notes (Signed)
MRN: [id number] DOB: Mar 17, 1941  Subjective:   Brandon [name] is a 75 y.o. male presenting for chief complaint of Sore Throat; Chills; and Generalized Body Aches  Reports 1 day history of worsening sore throat, chills, body aches, subjective fever, dry cough that elicits some wheezing, nasal congestion. Has tried Advil with some relief of his achiness. Denies chest pain, shob, n/v, abdominal pain, rash, ear pain, sinus pain. Quit smoking cigarettes 4 years ago. Denies history of COPD, asthma.  Taft has a current medication list which includes the following prescription(s): aspirin and lisinopril. Also has No Known Allergies.  Adib  has a past medical history of Shortness of breath; COPD (chronic obstructive pulmonary disease) ([place]); Peripheral vascular disease ([place]); AAA (abdominal aortic aneurysm) ([place]); DIC (disseminated intravascular coagulation) ([place]) (09/09/2012); Leg wound, left (12/12/2013); and Respiratory failure, post-operative ([place]) (09/09/2012). Also  has past surgical history that includes Abdominal aortic aneurysm repair (09/08/2012); Ganglion cyst excision; and Abdominal aortic aneurysm repair.  Objective:   Vitals: BP 128/70 mmHg  Pulse 116  Temp(Src) 98.2 F (36.8 C) (Oral)  Resp 18  Ht 5' 11.5" (1.816 m)  Wt 218 lb (98.884 kg)  BMI 29.98 kg/m2  SpO2 95%  Physical Exam  Constitutional: He is oriented to person, place, and time. He appears well-developed and well-nourished.  HENT:  TM's intact bilaterally, no effusions or erythema. Nasal turbinates pink and moist, nasal passages patent. No sinus tenderness. Oropharynx with mild post-nasal drainage, mucous membranes moist, dentition in good repair.  Eyes: Right eye exhibits no discharge. Left eye exhibits no discharge. No scleral icterus.  Neck: Normal range of motion. Neck supple.  Cardiovascular: Normal rate, regular rhythm and intact distal pulses.  Exam reveals no gallop and no friction rub.   No murmur  heard. Pulmonary/Chest: No respiratory distress. He has wheezes (most prominent over anterior lung fields). He has no rales.  Coarse lung sounds throughout.  Lymphadenopathy:    He has no cervical adenopathy.  Neurological: He is alert and oriented to person, place, and time.  Skin: Skin is warm and dry.   Dg Chest 2 View  04/08/2016  CLINICAL DATA:  Sore throat, chills EXAM: CHEST  2 VIEW COMPARISON:  10/03/2012 FINDINGS: Cardiomediastinal silhouette is stable. Mild dextroscoliosis mid thoracic spine. No acute infiltrate or pleural effusion. No pulmonary edema. Mild degenerative changes mid thoracic spine. IMPRESSION: No active cardiopulmonary disease. Mild dextroscoliosis and degenerative changes mid thoracic spine. Electronically Signed   By: Lahoma Crocker M.D.   On: 04/08/2016 13:47    Results for orders placed or performed in visit on 04/08/16 (from the past 24 hour(s))  POCT CBC     Status: Abnormal   Collection Time: 04/08/16  1:29 PM  Result Value Ref Range   WBC 16.1 (A) 4.6 - 10.2 K/uL   Lymph, poc 1.3 0.6 - 3.4   POC LYMPH PERCENT 8.0 (A) 10 - 50 %L   MID (cbc) 0.7 0 - 0.9   POC MID % 4.4 0 - 12 %M   POC Granulocyte 14.1 (A) 2 - 6.9   Granulocyte percent 87.6 (A) 37 - 80 %G   RBC 4.73 4.69 - 6.13 M/uL   Hemoglobin 15.4 14.1 - 18.1 g/dL   HCT, POC 43.9 43.5 - 53.7 %   MCV 92.7 80 - 97 fL   MCH, POC 32.5 (A) 27 - 31.2 pg   MCHC 35.0 31.8 - 35.4 g/dL   RDW, POC 13.5 %   Platelet Count, POC  200 142 - 424 K/uL   MPV 8.2 0 - 99.8 fL   Assessment and Plan :   1. Cough 2. Wheezing 3. Body aches 4. Sore throat - Will cover for lower respiratory infection Azithromycin. Use Hycodan and Tessalon for cough and sore throat. RTC in 1 week if no improvement.  Jaynee Eagles, PA-C [facility] [phone] 04/08/2016 1:04 PM

## 2016-04-08 NOTE — Patient Instructions (Addendum)
Cough, Adult Coughing is a reflex that clears your throat and your airways. Coughing helps to heal and protect your lungs. It is normal to cough occasionally, but a cough that happens with other symptoms or lasts a long time may be a sign of a condition that needs treatment. A cough may last only 2-3 weeks (acute), or it may last longer than 8 weeks (chronic). CAUSES Coughing is commonly caused by:  Breathing in substances that irritate your lungs.  A viral or bacterial respiratory infection.  Allergies.  Asthma.  Postnasal drip.  Smoking.  Acid backing up from the stomach into the esophagus (gastroesophageal reflux).  Certain medicines.  Chronic lung problems, including COPD (or rarely, lung cancer).  Other medical conditions such as heart failure. HOME CARE INSTRUCTIONS  Pay attention to any changes in your symptoms. Take these actions to help with your discomfort:  Take medicines only as told by your health care provider.  If you were prescribed an antibiotic medicine, take it as told by your health care provider. Do not stop taking the antibiotic even if you start to feel better.  Talk with your health care provider before you take a cough suppressant medicine.  Drink enough fluid to keep your urine clear or pale yellow.  If the air is dry, use a cold steam vaporizer or humidifier in your bedroom or your home to help loosen secretions.  Avoid anything that causes you to cough at work or at home.  If your cough is worse at night, try sleeping in a semi-upright position.  Avoid cigarette smoke. If you smoke, quit smoking. If you need help quitting, ask your health care provider.  Avoid caffeine.  Avoid alcohol.  Rest as needed. SEEK MEDICAL CARE IF:   You have new symptoms.  You cough up pus.  Your cough does not get better after 2-3 weeks, or your cough gets worse.  You cannot control your cough with suppressant medicines and you are losing sleep.  You  develop pain that is getting worse or pain that is not controlled with pain medicines.  You have a fever.  You have unexplained weight loss.  You have night sweats. SEEK IMMEDIATE MEDICAL CARE IF:  You cough up blood.  You have difficulty breathing.  Your heartbeat is very fast.   This information is not intended to replace advice given to you by your health care provider. Make sure you discuss any questions you have with your health care provider.   Document Released: 04/30/2011 Document Revised: 07/23/2015 Document Reviewed: 01/08/2015 Elsevier Interactive Patient Education 2016 Elsevier Inc.     IF you received an x-ray today, you will receive an invoice from [facility]. Please contact [facility] at [phone] with questions or concerns regarding your invoice.   IF you received labwork today, you will receive an invoice from [facility]. Please contact [facility] at [phone] with questions or concerns regarding your invoice.   Our billing staff will not be able to assist you with questions regarding bills from these companies.  You will be contacted with the lab results as soon as they are available. The fastest way to get your results is to activate your My Chart account. Instructions are located on the last page of this paperwork. If you have not heard from us regarding the results in 2 weeks, please contact this office.      

## 2016-05-01 ENCOUNTER — Ambulatory Visit (INDEPENDENT_AMBULATORY_CARE_PROVIDER_SITE_OTHER): Payer: Medicare HMO

## 2016-05-01 ENCOUNTER — Ambulatory Visit (INDEPENDENT_AMBULATORY_CARE_PROVIDER_SITE_OTHER): Payer: Medicare HMO | Admitting: Internal Medicine

## 2016-05-01 ENCOUNTER — Encounter: Payer: Self-pay | Admitting: Internal Medicine

## 2016-05-01 VITALS — BP 130/78 | HR 88 | Temp 98.0°F | Resp 12 | Ht 71.0 in | Wt 213.0 lb

## 2016-05-01 DIAGNOSIS — R059 Cough, unspecified: Secondary | ICD-10-CM

## 2016-05-01 DIAGNOSIS — R05 Cough: Secondary | ICD-10-CM | POA: Diagnosis not present

## 2016-05-01 DIAGNOSIS — R61 Generalized hyperhidrosis: Secondary | ICD-10-CM

## 2016-05-01 DIAGNOSIS — R42 Dizziness and giddiness: Secondary | ICD-10-CM | POA: Diagnosis not present

## 2016-05-01 DIAGNOSIS — R5383 Other fatigue: Secondary | ICD-10-CM | POA: Diagnosis not present

## 2016-05-01 LAB — COMPREHENSIVE METABOLIC PANEL
ALT: 9 U/L (ref 9–46)
AST: 12 U/L (ref 10–35)
Albumin: 3.9 g/dL (ref 3.6–5.1)
Alkaline Phosphatase: 56 U/L (ref 40–115)
BUN: 18 mg/dL (ref 7–25)
CO2: 26 mmol/L (ref 20–31)
Calcium: 9.1 mg/dL (ref 8.6–10.3)
Chloride: 106 mmol/L (ref 98–110)
Creat: 0.97 mg/dL (ref 0.70–1.18)
Glucose, Bld: 87 mg/dL (ref 65–99)
Potassium: 4.6 mmol/L (ref 3.5–5.3)
Sodium: 142 mmol/L (ref 135–146)
Total Bilirubin: 0.7 mg/dL (ref 0.2–1.2)
Total Protein: 6.3 g/dL (ref 6.1–8.1)

## 2016-05-01 LAB — POCT CBC
Granulocyte percent: 75.2 %G (ref 37–80)
HCT, POC: 40.7 % — AB (ref 43.5–53.7)
Hemoglobin: 14.2 g/dL (ref 14.1–18.1)
Lymph, poc: 2.2 (ref 0.6–3.4)
MCH, POC: 32.3 pg — AB (ref 27–31.2)
MCHC: 34.9 g/dL (ref 31.8–35.4)
MCV: 92.6 fL (ref 80–97)
MID (cbc): 1 — AB (ref 0–0.9)
MPV: 7.7 fL (ref 0–99.8)
POC Granulocyte: 9.6 — AB (ref 2–6.9)
POC LYMPH PERCENT: 17 %L (ref 10–50)
POC MID %: 7.8 %M (ref 0–12)
Platelet Count, POC: 241 10*3/uL (ref 142–424)
RBC: 4.4 M/uL — AB (ref 4.69–6.13)
RDW, POC: 13.5 %
WBC: 12.7 10*3/uL — AB (ref 4.6–10.2)

## 2016-05-01 MED ORDER — HYDROCODONE-HOMATROPINE 5-1.5 MG/5ML PO SYRP: 5.0000 mL | ORAL_SOLUTION | Freq: Every evening | ORAL | Status: DC | PRN

## 2016-05-01 MED ORDER — LEVOFLOXACIN 500 MG PO TABS: 500.0000 mg | ORAL_TABLET | Freq: Every day | ORAL | Status: DC

## 2016-05-01 NOTE — Progress Notes (Signed)
Subjective:  By signing my name below, I, Raven Small, attest that this documentation has been prepared under the direction and in the presence of Tami Lin, MD.  Electronically Signed: Thea Alken, ED Scribe. 05/01/2016. 1:54 PM.   Patient ID: Brandon [name], male    DOB: Feb 05, 1941, 75 y.o.   MRN: [id number]  HPI Chief Complaint  Patient presents with  . Sore Throat  . Cough  . Nasal Congestion    1 week  . Chills    HPI Comments: Brandon [name] is a 75 y.o. male who presents to the [facility] complaining of cough  Pt states he was 3 weeks ago for a sore throat, chills, body aches and cough, treated with a zpack and hycodan for lower respiratory infection. Pt states symptoms had been getting better but developed chills about a week ago. He states after being outside in the rain a couple days ago he went inside in the air condition. The following day he developed a productive cough, fatigue, SOB, sore throat and unsteadiness.    Patient Active Problem List   Diagnosis Date Noted  . Essential hypertension 12/05/2015  . Peripheral vascular disease, unspecified ([place]) 03/12/2014  . Atherosclerosis of native arteries of the extremities with intermittent claudication 03/12/2014  . Physical deconditioning 09/19/2012  . Chronic obstructive pulmonary disease with bronchospasm ([place]) 09/11/2012  . AAA (abdominal aortic aneurysm, ruptured) ([place]) 09/09/2012  . Hyperglycemia 09/09/2012  . Former tobacco use 09/08/2012   Past Medical History  Diagnosis Date  . Shortness of breath   . COPD (chronic obstructive pulmonary disease) ([place])   . Peripheral vascular disease ([place])   . AAA (abdominal aortic aneurysm) ([place])   . DIC (disseminated intravascular coagulation) ([place]) 09/09/2012  . Leg wound, left 12/12/2013  . Respiratory failure, post-operative ([place]) 09/09/2012   Past Surgical History  Procedure Laterality Date  . Abdominal aortic aneurysm repair  09/08/2012      Procedure: ANEURYSM ABDOMINAL AORTIC REPAIR;  Surgeon: [name], MD;  Location: [facility];  Service: Vascular;  Laterality: N/A;  open repair ruptured abdomenal aortic aneurysm  . Ganglion cyst excision      Right wrist  . Abdominal aortic aneurysm repair     No Known Allergies Prior to Admission medications   Medication Sig Start Date End Date Taking? Authorizing Provider  aspirin 81 MG tablet Take 81 mg by mouth daily.   Yes Historical Provider, MD  lisinopril (PRINIVIL,ZESTRIL) 10 MG tablet Take 1 tablet (10 mg total) by mouth daily. 12/05/15  Yes Ezekiel Slocumb, PA-C   Social History   Social History  . Marital Status: Married    Spouse Name: N/A  . Number of Children: N/A  . Years of Education: N/A   Occupational History  . Not on file.   Social History Main Topics  . Smoking status: Former Smoker -- 1.00 packs/day for 60 years    Quit date: 09/08/2012  . Smokeless tobacco: Never Used  . Alcohol Use: No  . Drug Use: No  . Sexual Activity: Not Currently    Birth Control/ Protection: None   Other Topics Concern  . Not on file   Social History Narrative   Review of Systems  Constitutional: Positive for chills and fatigue. Negative for fever.  HENT: Positive for congestion and sore throat.   Respiratory: Positive for cough and shortness of breath.      Objective:   Physical Exam  Constitutional: He is oriented  to person, place, and time. He appears well-developed and well-nourished. No distress.  HENT:  Head: Normocephalic and atraumatic.  Eyes: Conjunctivae and EOM are normal.  Neck: Neck supple.  Cardiovascular: Normal rate.   Pulmonary/Chest: Effort normal. He has rhonchi ( right posterior).  Musculoskeletal: Normal range of motion.  Neurological: He is alert and oriented to person, place, and time.  Skin: Skin is warm and dry.  Psychiatric: He has a normal mood and affect. His behavior is normal.  Nursing note and vitals reviewed.   Results for  orders placed or performed in visit on 05/01/16  POCT CBC  Result Value Ref Range   WBC 12.7 (A) 4.6 - 10.2 K/uL   Lymph, poc 2.2 0.6 - 3.4   POC LYMPH PERCENT 17.0 10 - 50 %L   MID (cbc) 1.0 (A) 0 - 0.9   POC MID % 7.8 0 - 12 %M   POC Granulocyte 9.6 (A) 2 - 6.9   Granulocyte percent 75.2 37 - 80 %G   RBC 4.40 (A) 4.69 - 6.13 M/uL   Hemoglobin 14.2 14.1 - 18.1 g/dL   HCT, POC 40.7 (A) 43.5 - 53.7 %   MCV 92.6 80 - 97 fL   MCH, POC 32.3 (A) 27 - 31.2 pg   MCHC 34.9 31.8 - 35.4 g/dL   RDW, POC 13.5 %   Platelet Count, POC 241 142 - 424 K/uL   MPV 7.7 0 - 99.8 fL   CXR--no chg from last month      Assessment & Plan:  Cough - Plan: DG Chest 2 View  Night sweats  Other fatigue - Plan: POCT CBC, Comprehensive metabolic panel  Lightheadedness---due to congestion  Sinusitis/Lower Resp Inf as etiology---relapse versus reinfection   Meds ordered this encounter  Medications  . levofloxacin (LEVAQUIN) 500 MG tablet    Sig: Take 1 tablet (500 mg total) by mouth daily.    Dispense:  10 tablet    Refill:  0  . HYDROcodone-homatropine (HYCODAN) 5-1.5 MG/5ML syrup    Sig: Take 5 mLs by mouth at bedtime as needed.    Dispense:  120 mL    Refill:  0   F/u 3-4 weeks if not well

## 2016-05-01 NOTE — Patient Instructions (Signed)
     IF you received an x-ray today, you will receive an invoice from [facility]. Please contact  Radiology at [phone] with questions or concerns regarding your invoice.   IF you received labwork today, you will receive an invoice from [facility]. Please contact [facility] at [phone] with questions or concerns regarding your invoice.   Our billing staff will not be able to assist you with questions regarding bills from these companies.  You will be contacted with the lab results as soon as they are available. The fastest way to get your results is to activate your My Chart account. Instructions are located on the last page of this paperwork. If you have not heard from us regarding the results in 2 weeks, please contact this office.      

## 2016-12-16 ENCOUNTER — Other Ambulatory Visit: Payer: Self-pay | Admitting: Physician Assistant

## 2016-12-16 DIAGNOSIS — I10 Essential (primary) hypertension: Secondary | ICD-10-CM

## 2017-01-04 ENCOUNTER — Ambulatory Visit (INDEPENDENT_AMBULATORY_CARE_PROVIDER_SITE_OTHER)
Admission: RE | Admit: 2017-01-04 | Discharge: 2017-01-04 | Disposition: A | Discharge status: Home / Self Care | Payer: Medicare HMO | Source: Ambulatory Visit | Attending: Internal Medicine | Admitting: Internal Medicine

## 2017-01-04 ENCOUNTER — Encounter: Payer: Self-pay | Admitting: Internal Medicine

## 2017-01-04 ENCOUNTER — Ambulatory Visit (INDEPENDENT_AMBULATORY_CARE_PROVIDER_SITE_OTHER): Payer: Medicare HMO | Admitting: Internal Medicine

## 2017-01-04 VITALS — BP 128/80 | HR 90 | Ht 71.5 in | Wt 225.6 lb

## 2017-01-04 DIAGNOSIS — R0609 Other forms of dyspnea: Secondary | ICD-10-CM | POA: Diagnosis not present

## 2017-01-04 DIAGNOSIS — J449 Chronic obstructive pulmonary disease, unspecified: Secondary | ICD-10-CM

## 2017-01-04 DIAGNOSIS — I10 Essential (primary) hypertension: Secondary | ICD-10-CM

## 2017-01-04 DIAGNOSIS — E669 Obesity, unspecified: Secondary | ICD-10-CM | POA: Insufficient documentation

## 2017-01-04 MED ORDER — IRBESARTAN 150 MG PO TABS: 150.0000 mg | ORAL_TABLET | Freq: Every day | ORAL | 11 refills | Status: DC

## 2017-01-04 MED ORDER — TIOTROPIUM BROMIDE-OLODATEROL 2.5-2.5 MCG/ACT IN AERS: 2.0000 | INHALATION_SPRAY | Freq: Every day | RESPIRATORY_TRACT | 0 refills | Status: DC

## 2017-01-04 NOTE — Assessment & Plan Note (Signed)
01/04/2017  Walked RA x 3 laps @ 185 ft each stopped due to  End of study, nl pace, no sob or desat    - Spirometry 01/04/2017  FEV1 2.51 (75%)  Ratio 67 but effort portion of f/v is moderately truncated though not true plateau  01/04/2017  After extensive coaching [facility]  effectiveness =    75%  > continue stiolto until 6 weeks off acei then try off    When respiratory symptoms begin or become refractory well after a patient reports complete smoking cessation,  Especially when this wasn't the case while they were smoking, a red flag is raised based on the work of Dr Kris Mouton which states:  if you quit smoking when your best day FEV1 is still well preserved it is highly unlikely you will progress to severe disease.  That is to say, once the smoking stops,  the symptoms should not suddenly erupt or markedly worsen.  If so, the differential diagnosis should include  obesity/deconditioning,  LPR/Reflux/Aspiration syndromes,  occult CHF, or  especially side effect of medications commonly used in this population.     Most likely this is combination of obesity and acei effects so first needs a trial off acei x 6 weeks and if not better after that may need additional w/u eg cpst as he really does not have typical copd exam or hx or spirometry.    Total time devoted to counseling  > 50 % of initial 60 min office visit:  review case with pt/ discussion of options/alternatives/ personally creating written customized instructions  in presence of pt  then going over those specific  Instructions directly with the pt including how to use all of the meds but in particular covering each new medication in detail and the difference between the maintenance= "automatic" meds and the prns using an action plan format for the latter (If this problem/symptom => do that organization reading Left to right).  Please see AVS from this visit for a full list of these instructions which I personally wrote for this pt and  are unique to  this visit.

## 2017-01-04 NOTE — Progress Notes (Signed)
Subjective:     Patient ID: Brandon [name], male   DOB: June 06, 1941,    MRN: [id number]  HPI  77 yowm quit 2013 at dx of AAA resection and breathing able to yard work with indolent onset of doe around Dec 2017 s cough so referred to pulmonary clinic 01/04/2017 by Dr  Moreen Fowler   01/04/2017 1st [name]   Chief Complaint  Patient presents with  . Pulmonary Consult    Referred by Dr. Antony Contras for eval of COPD.  Pt c/o SOB for the past 2-3 months. He states he gets SOB approx 60 ft- better since starting of Stiolto.    indolent onset of doe x 10/2016 to point where only walking 60 ft much worse on anoro with upper airway symptoms worse on it and better on stiolto but no where near his baseline activity tolerance   No problem at rest or noct and no cough and denies significant weight gain since quit smoking though records say wt 180 11/2012  No obvious day to day or daytime variability or assoc excess/ purulent sputum or mucus plugs or hemoptysis or cp or chest tightness, subjective wheeze or overt sinus or hb symptoms. No unusual exp hx or h/o childhood pna/ asthma or knowledge of premature birth.  Sleeping ok without nocturnal  or early am exacerbation  of respiratory  c/o's or need for noct saba. Also denies any obvious fluctuation of symptoms with weather or environmental changes or other aggravating or alleviating factors except as outlined above   Current Medications, Allergies, Complete Past Medical History, Past Surgical History, Family History, and Social History were reviewed in [employer] record.  ROS  The following are not active complaints unless bolded sore throat, dysphagia, dental problems, itching, sneezing,  nasal congestion or excess/ purulent secretions, ear ache,   fever, chills, sweats, unintended wt loss, classically pleuritic or exertional cp,  orthopnea pnd or leg swelling, presyncope, palpitations, abdominal pain,  anorexia, nausea, vomiting, diarrhea  or change in bowel or bladder habits, change in stools or urine, dysuria,hematuria,  rash, arthralgias, visual complaints, headache, numbness, weakness or ataxia or problems with walking or coordination,  change in mood/affect or memory.            Review of Systems     Objective:   Physical Exam    amb wm nad   Wt Readings from Last 3 Encounters:  01/04/17 225 lb 9.6 oz (102.3 kg)  05/01/16 213 lb (96.6 kg)  04/08/16 218 lb (98.9 kg)    Vital signs reviewed - Note on arrival 02 sats  96% on RA  - note bp 128/80    HEENT: nl dentition, turbinates bilaterally, and oropharynx. Nl external ear canals without cough reflex   NECK :  without JVD/Nodes/TM/ nl carotid upstrokes bilaterally   LUNGS: no acc muscle use,  Nl contour chest which is clear to A and P bilaterally without cough on insp or exp maneuvers   CV:  RRR  no s3 or murmur or increase in P2, and no edema   ABD:  Tensely obese  nontender with nl inspiratory excursion in the supine position. No bruits or organomegaly appreciated, bowel sounds nl  MS:  Nl gait/ ext warm without deformities, calf tenderness, cyanosis or clubbing No obvious joint restrictions   SKIN: warm and dry without lesions    NEURO:  alert, approp, nl sensorium with  no motor or cerebellar deficits apparent.  CXR PA and Lateral:   01/04/2017 :    I personally reviewed images and agree with radiology impression as follows:   1. Somewhat prominent linear markings in the lung bases most likely reflect atelectasis. No definite pneumonia. 2. No change in hyper aeration.       Assessment:

## 2017-01-04 NOTE — Assessment & Plan Note (Signed)
Body mass index is 31.03 kg/m.  trending up slowly despite his hx to the contrary  No results found for: TSH   Contributing to gerd risk/ doe/reviewed the need and the process to achieve and maintain neg calorie balance > defer f/u primary care including intermittently monitoring thyroid status

## 2017-01-04 NOTE — Assessment & Plan Note (Addendum)
Spirometry 01/04/2017  Not c/w copd by f/v loop > trial off acei rec and f/u pulmonary prn   - The proper method of use, as well as anticipated side effects, of a metered-dose inhaler are discussed and demonstrated to the patient. Improved effectiveness after extensive coaching during this visit to a level of approximately 75 % from a baseline of 50 % using respimat device   If back to baseline off acei then ok to try off stiolto to see what difference if any it then makes.

## 2017-01-04 NOTE — Patient Instructions (Addendum)
Keep taking your stiolto every morning until you finish it up then try off it as you do not have significant copd - if worse breathing off it, restart it.   I believe your problem is likely related to your blood pressure pill and the only way to know is try off x 6 weeks  Stop lisinopril and start ibesartan 150 mg daily and your breathing should gradually return to normal   Please remember to go to the  x-ray department downstairs in the basement  for your tests - we will call you with the results when they are available.      If you are satisfied with your treatment plan,  let your doctor know and he/she can either refill your medications or you can return here when your prescription runs out.     If in any way you are not 100% satisfied,  please tell us.  If 100% better, tell your friends!  Pulmonary follow up is as needed

## 2017-01-05 ENCOUNTER — Telehealth: Payer: Self-pay | Admitting: Internal Medicine

## 2017-01-05 NOTE — Progress Notes (Signed)
LMTCB

## 2017-01-05 NOTE — Assessment & Plan Note (Signed)
In the best review of chronic cough to date ( NEJM 2016 375 [phone]) ,  ACEi are now felt to cause cough in up to  20% of pts which is a 4 fold increase from previous reports and does not include the variety of non-specific complaints we see in pulmonary clinic in pts on ACEi but previously attributed to another dx like  Copd/asthma and  include PNDS, throat and chest congestion, "bronchitis", unexplained dyspnea and noct "strangling" sensations, and hoarseness, but also  atypical /refractory GERD symptoms like dysphagia and "bad heartburn"   The only way I know  to prove this is not an "ACEi Case" is a trial off ACEi x a minimum of 6 weeks then regroup prn   Try avapro 150 mg daily so he can break it in half if too strong or take two if too weak.  see avs for instructions unique to this ov

## 2017-01-05 NOTE — Telephone Encounter (Signed)
Notes Recorded by Tanda Rockers, MD on 01/04/2017 at 5:21 PM EST Call pt: Reviewed cxr and no acute change so no change in recommendations made at [facility] ------------------------------------------- Spoke with pt. He is aware of results. Nothing further was needed.

## 2017-03-08 ENCOUNTER — Encounter: Payer: Self-pay | Admitting: Cardiology

## 2017-04-01 ENCOUNTER — Encounter (HOSPITAL_COMMUNITY): Payer: Medicare HMO

## 2017-04-01 ENCOUNTER — Ambulatory Visit: Payer: Medicare HMO | Admitting: Vascular Surgery

## 2017-04-06 ENCOUNTER — Encounter: Payer: Self-pay | Admitting: Vascular Surgery

## 2017-04-08 ENCOUNTER — Ambulatory Visit: Payer: Medicare HMO | Admitting: Vascular Surgery

## 2017-04-08 ENCOUNTER — Encounter (HOSPITAL_COMMUNITY): Payer: Medicare HMO

## 2017-04-09 ENCOUNTER — Ambulatory Visit (INDEPENDENT_AMBULATORY_CARE_PROVIDER_SITE_OTHER): Payer: Medicare HMO

## 2017-04-09 ENCOUNTER — Ambulatory Visit (INDEPENDENT_AMBULATORY_CARE_PROVIDER_SITE_OTHER): Payer: Medicare HMO | Admitting: Family Medicine

## 2017-04-09 ENCOUNTER — Encounter: Payer: Self-pay | Admitting: Family Medicine

## 2017-04-09 VITALS — BP 138/74 | HR 91 | Temp 98.6°F | Resp 18 | Ht 70.0 in | Wt 217.0 lb

## 2017-04-09 DIAGNOSIS — R05 Cough: Secondary | ICD-10-CM

## 2017-04-09 DIAGNOSIS — R509 Fever, unspecified: Secondary | ICD-10-CM

## 2017-04-09 DIAGNOSIS — J441 Chronic obstructive pulmonary disease with (acute) exacerbation: Secondary | ICD-10-CM

## 2017-04-09 DIAGNOSIS — R059 Cough, unspecified: Secondary | ICD-10-CM

## 2017-04-09 MED ORDER — DOXYCYCLINE HYCLATE 100 MG PO TABS: 100.0000 mg | ORAL_TABLET | Freq: Two times a day (BID) | ORAL | 0 refills | Status: DC

## 2017-04-09 MED ORDER — PREDNISONE 10 MG PO TABS
ORAL_TABLET | ORAL | 0 refills | Status: DC
Start: 2017-04-09 — End: 2017-04-28

## 2017-04-09 NOTE — Progress Notes (Signed)
Brandon [name] is a 76 y.o. male who presents to Primary Care at [facility] today for concern for PNA:  1.  Cough:  The patient has had cough plus aches for the past week. He reports malaise and generally feeling more tired than usual. Cough is productive of thick green mucus. Stated he has had this all week but Thursday was when he really started noticing a decrease in his energy. Has had some mild sinus congestion but mostly it is a congestion in his chest along with a cough. No real .  He is a long-term smoker. Quit 5 years ago after surgery for aortic aneurysm repair. He is followed by pulmonology and reportedly patient tells me he has never been diagnosed with COPD. He has no inhalers at home.  Cough is worse at night keeping him awake. No fevers or chills. No abdominal pain. No nausea or vomiting. His had slightly decreased appetite but still is eating and staying hydrated.  ROS as above.  Pertinently, no chest pain, palpitations, SOB, Fever, Chills, Abd pain, N/V/D.   PMH reviewed. Patient is a nonsmoker.   Past Medical History:  Diagnosis Date  . AAA (abdominal aortic aneurysm) ([place])   . COPD (chronic obstructive pulmonary disease) ([place])   . DIC (disseminated intravascular coagulation) ([place]) 09/09/2012  . Leg wound, left 12/12/2013  . Peripheral vascular disease ([place])   . Respiratory failure, post-operative ([place]) 09/09/2012  . Shortness of breath    Past Surgical History:  Procedure Laterality Date  . ABDOMINAL AORTIC ANEURYSM REPAIR  09/08/2012   Procedure: ANEURYSM ABDOMINAL AORTIC REPAIR;  Surgeon: [name], MD;  Location: [place];  Service: Vascular;  Laterality: N/A;  open repair ruptured abdomenal aortic aneurysm  . GANGLION CYST EXCISION     Right wrist    Medications reviewed. Current Outpatient Prescriptions  Medication Sig Dispense Refill  . aspirin 81 MG tablet Take 81 mg by mouth daily.    Marland Kitchen escitalopram (LEXAPRO) 10 MG tablet Take 1 tablet by mouth daily.      Marland Kitchen ibuprofen (ADVIL,MOTRIN) 200 MG tablet Take 200 mg by mouth every 6 (six) hours as needed.    . irbesartan (AVAPRO) 150 MG tablet Take 1 tablet (150 mg total) by mouth daily. 30 tablet 11  . Tiotropium Bromide-Olodaterol (STIOLTO RESPIMAT) 2.5-2.5 MCG/ACT AERS Inhale 2 puffs into the lungs daily. 1 Inhaler 0   No current facility-administered medications for this visit.      Physical Exam:  BP 138/74 (BP Location: Left Arm, Patient Position: Sitting, Cuff Size: Normal)   Pulse 91   Temp 98.6 F (37 C) (Oral)   Resp 18   Ht 5\' 10"  (1.778 m)   Wt 217 lb (98.4 kg)   SpO2 96%   BMI 31.14 kg/m  Gen:  Alert, cooperative patient who appears stated age in no acute distress.  Vital signs reviewed. Head: [place]/AT.   Eyes:  EOMI, PERRL.   Ears:  External ears WNL, Bilateral TM's normal without retraction, redness or bulging. Nose:  Septum midline  Mouth:  MMM, tonsils non-erythematous, non-edematous.  Dentures in place top and bottom Neck:  No LAD Pulm:  Rhonchi BL bases, do not resolve with coughing Cardiac:  Regular rate and rhythm  Ext:  No LE edema  Assessment and Plan:  1.  LRTI: - Radiographs negative for any pneumonia today.  -based on his 57 year smoking history as well as radiograph images he seems to have some degree of COPD. -treating  him as such with doxycycline and prednisone.  - RTC if no better in a week or sooner if worsening.

## 2017-04-09 NOTE — Patient Instructions (Addendum)
It was good to meet you today.  We are treating your upper respiratory infection.  Take the doxycycline once in the morning and once in the evening for the next 7 days. This is the antibiotic.  Take the prednisone as follows:  Take 6 pills today, 5 pills tomorrow, 4 pills today after, 3 pills after, 2 pills after, 1 pill last day  This should get you feeling better.  If you start feeling worse despite the medicine to fully come back and see [place].    Have a good rest of the weekend!   IF you received an x-ray today, you will receive an invoice from [facility]. Please contact [facility] at [phone] with questions or concerns regarding your invoice.   IF you received labwork today, you will receive an invoice from [place]. Please contact [facility] at [phone] with questions or concerns regarding your invoice.   Our billing staff will not be able to assist you with questions regarding bills from these companies.  You will be contacted with the lab results as soon as they are available. The fastest way to get your results is to activate your My Chart account. Instructions are located on the last page of this paperwork. If you have not heard from [place] regarding the results in 2 weeks, please contact this office.

## 2017-04-13 ENCOUNTER — Other Ambulatory Visit: Payer: Self-pay

## 2017-04-13 DIAGNOSIS — I739 Peripheral vascular disease, unspecified: Secondary | ICD-10-CM

## 2017-04-15 ENCOUNTER — Ambulatory Visit (HOSPITAL_COMMUNITY)
Admission: RE | Admit: 2017-04-15 | Discharge: 2017-04-15 | Disposition: A | Discharge status: Home / Self Care | Payer: Medicare HMO | Source: Ambulatory Visit | Attending: Vascular Surgery | Admitting: Vascular Surgery

## 2017-04-15 ENCOUNTER — Ambulatory Visit (INDEPENDENT_AMBULATORY_CARE_PROVIDER_SITE_OTHER): Payer: Medicare HMO | Admitting: Vascular Surgery

## 2017-04-15 VITALS — BP 142/84 | HR 54 | Temp 97.3°F | Resp 18 | Ht 71.5 in | Wt 225.0 lb

## 2017-04-15 DIAGNOSIS — I739 Peripheral vascular disease, unspecified: Secondary | ICD-10-CM

## 2017-04-15 DIAGNOSIS — I713 Abdominal aortic aneurysm, ruptured, unspecified: Secondary | ICD-10-CM

## 2017-04-15 NOTE — Progress Notes (Signed)
Vitals:   04/15/17 1353  BP: (!) 142/83  Pulse: (!) 54  Resp: 18  Temp: 97.3 F (36.3 C)  SpO2: 96%  Weight: 225 lb (102.1 kg)  Height: 5' 11.5" (1.816 m)

## 2017-04-15 NOTE — Progress Notes (Signed)
Patient ID: Brandon [name], male   DOB: 10/04/1941, 76 y.o.   MRN: [id number]  Reason for Consult: AAA (f/u)   Referred by Antony Contras, MD  Subjective:     HPI:  Brandon [name] is a 76 y.o. male with previous history of ruptured abdominal aortic aneurysm which was repaired open by Dr. Kellie Simmering nearly 5 years ago. He has done very well. He continues to walk. He is quit smoking at the time of rupture does take aspirin daily. Recently had a URI which he is recovering from. He also recently passed echocardiogram and stress test. He is continuing to work in fact is here on his left leg today. He is not having complaints regarding today's visit he does suffer from chronic shortness of breath which has been exacerbated by this URI. He has lost 17lbs recently and it was intentional.  Past Medical History:  Diagnosis Date  . AAA (abdominal aortic aneurysm) ([place])   . COPD (chronic obstructive pulmonary disease) ([place])   . DIC (disseminated intravascular coagulation) ([place]) 09/09/2012  . Leg wound, left 12/12/2013  . Peripheral vascular disease ([place])   . Respiratory failure, post-operative ([place]) 09/09/2012  . Shortness of breath    Family History  Problem Relation Age of Onset  . Dementia Mother   . Cancer Mother   . Cancer Father   . Heart disease Father    Past Surgical History:  Procedure Laterality Date  . ABDOMINAL AORTIC ANEURYSM REPAIR  09/08/2012   Procedure: ANEURYSM ABDOMINAL AORTIC REPAIR;  Surgeon: [name], MD;  Location: [place];  Service: Vascular;  Laterality: N/A;  open repair ruptured abdomenal aortic aneurysm  . GANGLION CYST EXCISION     Right wrist    Short Social History:  Social History  Substance Use Topics  . Smoking status: Former Smoker    Packs/day: 1.00    Years: 60.00    Quit date: 09/08/2012  . Smokeless tobacco: Never Used  . Alcohol use No    No Known Allergies  Current Outpatient Prescriptions  Medication Sig Dispense Refill  . aspirin  81 MG tablet Take 81 mg by mouth daily.    Marland Kitchen atorvastatin (LIPITOR) 10 MG tablet     . escitalopram (LEXAPRO) 10 MG tablet Take 1 tablet by mouth daily.    Marland Kitchen ibuprofen (ADVIL,MOTRIN) 200 MG tablet Take 200 mg by mouth every 6 (six) hours as needed.    . irbesartan (AVAPRO) 150 MG tablet Take 1 tablet (150 mg total) by mouth daily. 30 tablet 11  . predniSONE (DELTASONE) 10 MG tablet Take 6 pills today, 5 pills tomorrow, 4 pills today after, 3 pills after, 2 pills after, 1 pill last day 21 tablet 0  . Tiotropium Bromide-Olodaterol (STIOLTO RESPIMAT) 2.5-2.5 MCG/ACT AERS Inhale 2 puffs into the lungs daily. 1 Inhaler 0  . doxycycline (VIBRA-TABS) 100 MG tablet Take 1 tablet (100 mg total) by mouth 2 (two) times daily. X 7 days (Patient not taking: Reported on 04/15/2017) 14 tablet 0   No current facility-administered medications for this visit.     Review of Systems  Constitutional:  Constitutional negative. HENT: HENT negative.  Eyes: Eyes negative.  Respiratory: Positive for cough.  Cardiovascular: Positive for dyspnea with exertion.  GI: Gastrointestinal negative.  Musculoskeletal: Musculoskeletal negative.  Skin: Skin negative.  Neurological: Neurological negative. Hematologic: Hematologic/lymphatic negative.        Objective:  Objective   Vitals:   04/15/17 1353 04/15/17 1355  BP: (!) 142/83 Marland Kitchen)  142/84  Pulse: (!) 54 (!) 54  Resp: 18   Temp: 97.3 F (36.3 C)   SpO2: 96%   Weight: 225 lb (102.1 kg)   Height: 5' 11.5" (1.816 m)    Body mass index is 30.94 kg/m.  Physical Exam  Constitutional: He is oriented to person, place, and time. He appears well-developed.  HENT:  Head: Normocephalic.  Eyes: Pupils are equal, round, and reactive to light.  Neck: Normal range of motion.  Cardiovascular: Normal rate.   Pulses:      Radial pulses are 2+ on the right side, and 2+ on the left side.       Dorsalis pedis pulses are 2+ on the right side, and 2+ on the left side.        Posterior tibial pulses are 2+ on the right side, and 2+ on the left side.  Abdominal: Soft.  Multiple small hernias  Musculoskeletal: Normal range of motion. He exhibits no edema.  Neurological: He is alert and oriented to person, place, and time.  Skin: Skin is warm and dry.  Psychiatric: He has a normal mood and affect. His behavior is normal. Judgment and thought content normal.    Data: ABIs today that it 1 bilaterally and triphasic.     Assessment/Plan:     76 year old male follows up from previous history of ruptured abdominal aortic aneurysm that was repaired open. He also had bilateral extensive lower extremity thrombectomies. He will follow-up in one year with ABIs unless he needs to be seen sooner.   Waynetta Sandy MD [facility]

## 2017-04-27 ENCOUNTER — Encounter: Payer: Self-pay | Admitting: Family Medicine

## 2017-04-27 ENCOUNTER — Ambulatory Visit (INDEPENDENT_AMBULATORY_CARE_PROVIDER_SITE_OTHER): Payer: Medicare HMO

## 2017-04-27 ENCOUNTER — Ambulatory Visit (INDEPENDENT_AMBULATORY_CARE_PROVIDER_SITE_OTHER): Payer: Medicare HMO | Admitting: Family Medicine

## 2017-04-27 VITALS — BP 118/68 | HR 94 | Temp 98.4°F | Resp 17 | Ht 71.5 in | Wt 218.0 lb

## 2017-04-27 DIAGNOSIS — R059 Cough, unspecified: Secondary | ICD-10-CM

## 2017-04-27 DIAGNOSIS — J189 Pneumonia, unspecified organism: Secondary | ICD-10-CM

## 2017-04-27 DIAGNOSIS — R05 Cough: Secondary | ICD-10-CM | POA: Diagnosis not present

## 2017-04-27 LAB — POCT CBC
Granulocyte percent: 83.4 %G — AB (ref 37–80)
HCT, POC: 43.4 % — AB (ref 43.5–53.7)
Hemoglobin: 15.3 g/dL (ref 14.1–18.1)
Lymph, poc: 2.3 (ref 0.6–3.4)
MCH, POC: 33.1 pg — AB (ref 27–31.2)
MCHC: 35.3 g/dL (ref 31.8–35.4)
MCV: 93.9 fL (ref 80–97)
MID (cbc): 1.4 — AB (ref 0–0.9)
MPV: 8.3 fL (ref 0–99.8)
POC Granulocyte: 18.4 — AB (ref 2–6.9)
POC LYMPH PERCENT: 10.4 %L (ref 10–50)
POC MID %: 6.2 %M (ref 0–12)
Platelet Count, POC: 271 10*3/uL (ref 142–424)
RBC: 4.62 M/uL — AB (ref 4.69–6.13)
RDW, POC: 13.6 %
WBC: 22.1 10*3/uL — AB (ref 4.6–10.2)

## 2017-04-27 MED ORDER — LEVOFLOXACIN 500 MG PO TABS
500.0000 mg | ORAL_TABLET | Freq: Every day | ORAL | 0 refills | Status: DC
Start: 2017-04-27 — End: 2017-06-02

## 2017-04-27 MED ORDER — CEFTRIAXONE SODIUM 1 G IJ SOLR
1.0000 g | Freq: Once | INTRAMUSCULAR | Status: AC
  Administered 2017-04-27: 1 g via INTRAMUSCULAR

## 2017-04-27 NOTE — Progress Notes (Signed)
Brandon [name] is a 76 y.o. male who presents to Primary Care at [facility] today for prolonged cough:  1.  Prolonged cough:  Patient has had ongoing issues with cough since last being seen here in late May.  Took prednisone, which helped some, but then cough returned.  Only symptom thus far has been cough until last night.  Cough has alternated between being nonproductive and productive of thick white phlegm.  Denies any purulent production.   Started with chills last night.  Had fever and sweating this AM.  Did not take temperature. Has not taken any anti-pyretics today.  Last meal was at noon.  No change in appetite.  No lightheadedness.  No chest pain.  No dyspnea on exertion.  States breathing ability is at his baseline.  No palpitations. Able to work some today.  Did take blood pressure medicine.    ROS as above.    PMH reviewed. Patient is a nonsmoker.   Past Medical History:  Diagnosis Date  . AAA (abdominal aortic aneurysm) ([place])   . COPD (chronic obstructive pulmonary disease) ([place])   . DIC (disseminated intravascular coagulation) ([place]) 09/09/2012  . Leg wound, left 12/12/2013  . Peripheral vascular disease ([place])   . Respiratory failure, post-operative ([place]) 09/09/2012  . Shortness of breath    Past Surgical History:  Procedure Laterality Date  . ABDOMINAL AORTIC ANEURYSM REPAIR  09/08/2012   Procedure: ANEURYSM ABDOMINAL AORTIC REPAIR;  Surgeon: [name], MD;  Location: [place];  Service: Vascular;  Laterality: N/A;  open repair ruptured abdomenal aortic aneurysm  . GANGLION CYST EXCISION     Right wrist    Medications reviewed. Current Outpatient Prescriptions  Medication Sig Dispense Refill  . aspirin 81 MG tablet Take 81 mg by mouth daily.    Marland Kitchen atorvastatin (LIPITOR) 10 MG tablet     . escitalopram (LEXAPRO) 10 MG tablet Take 1 tablet by mouth daily.    Marland Kitchen ibuprofen (ADVIL,MOTRIN) 200 MG tablet Take 200 mg by mouth every 6 (six) hours as needed.    . irbesartan  (AVAPRO) 150 MG tablet Take 1 tablet (150 mg total) by mouth daily. 30 tablet 11  . predniSONE (DELTASONE) 10 MG tablet Take 6 pills today, 5 pills tomorrow, 4 pills today after, 3 pills after, 2 pills after, 1 pill last day 21 tablet 0  . Tiotropium Bromide-Olodaterol (STIOLTO RESPIMAT) 2.5-2.5 MCG/ACT AERS Inhale 2 puffs into the lungs daily. 1 Inhaler 0   No current facility-administered medications for this visit.      Physical Exam:  BP 118/68   Pulse 94   Temp 98.4 F (36.9 C) (Oral)   Resp 17   Ht 5' 11.5" (1.816 m)   Wt 218 lb (98.9 kg)   SpO2 98%   BMI 29.98 kg/m  Gen:  Alert, cooperative patient who appears stated age in no acute distress.  Vital signs reviewed. Looks well, sitting on side of bed HEENT: EOMI,  MMM Pulm:  No wheezing BL bases.  Does breath through mildly pursed lips, which he states has been his baseline for years.  No consolidation noted  Cardiac:  Regular rate and rhythm  Abd:  Soft/obese/ND/nontender.  Surgical scars noted, well healed.  Good bowel sounds throughout all four quadrants.  No masses noted.  Exts: Non edematous BL  LE, warm and well perfused.  Neuro:  Fully alert and oriented.  Easily up walking around room.  Strength and sensation are 5/5 BL UE and LE's.  SKin:  No breaks in skin, no rash.    Results for orders placed or performed in visit on 04/27/17  POCT CBC  Result Value Ref Range   WBC 22.1 (A) 4.6 - 10.2 K/uL   Lymph, poc 2.3 0.6 - 3.4   POC LYMPH PERCENT 10.4 10 - 50 %L   MID (cbc) 1.4 (A) 0 - 0.9   POC MID % 6.2 0 - 12 %M   POC Granulocyte 18.4 (A) 2 - 6.9   Granulocyte percent 83.4 (A) 37 - 80 %G   RBC 4.62 (A) 4.69 - 6.13 M/uL   Hemoglobin 15.3 14.1 - 18.1 g/dL   HCT, POC 43.4 (A) 43.5 - 53.7 %   MCV 93.9 80 - 97 fL   MCH, POC 33.1 (A) 27 - 31.2 pg   MCHC 35.3 31.8 - 35.4 g/dL   RDW, POC 13.6 %   Platelet Count, POC 271 142 - 424 K/uL   MPV 8.3 0 - 99.8 fL    Assessment and Plan:  1.  Pneumonia: - early signs  based on CXR - patient with hypotension on first BP.  Has continued to rise since then.  He did take his blood pressure medicines today. At no point did he have orthostasis/lightheadedness. - O2 sats were 98% throughout entire visit.  Speaking in full sentences.  Looks surprisingly well for his numbers.  - Concern for worsening PNA based on initial hypotension and elevated WBC. Has chronically elevated WBC, but still above baseline of mid-teens, concerning again for infection.   - no evidence of other infectious/intra-abdominal process that would cause leukocytosis.   -  Patient adamantly refused ED tonight despite encouragement.  - Plan is to treat with Rocephin here and start Levaquin tonight as well for CAP  - He is to FU with me tomorrow.   - He understands that if he feels worse tonight or in the AM, he will call 911 immediately.  If he is not improved with abx tomorrow, he agrees to go to ED then. - Up easily after exam.  Walking around clinic and joking with staff.  Again, he looks surprisingly great clinically.    - Pt in exam room being monitored for 75 minutes.  40 minutes face time with patient.

## 2017-04-27 NOTE — Patient Instructions (Addendum)
Pick up the levaquin tonight and take it.  You also received an antibiotic shot here at clinic.  If AT ANY POINT tonight or tomorrow you start feeling worse, call 911 to go to the ED immediately.    I will see you back tomorrow    IF you received an x-ray today, you will receive an invoice from [facility]. Please contact [facility] at [phone] with questions or concerns regarding your invoice.   IF you received labwork today, you will receive an invoice from [place]. Please contact [facility] at [phone] with questions or concerns regarding your invoice.   Our billing staff will not be able to assist you with questions regarding bills from these companies.  You will be contacted with the lab results as soon as they are available. The fastest way to get your results is to activate your My Chart account. Instructions are located on the last page of this paperwork. If you have not heard from [place] regarding the results in 2 weeks, please contact this office.

## 2017-04-28 ENCOUNTER — Encounter: Payer: Self-pay | Admitting: Family Medicine

## 2017-04-28 ENCOUNTER — Ambulatory Visit (INDEPENDENT_AMBULATORY_CARE_PROVIDER_SITE_OTHER): Payer: Medicare HMO | Admitting: Family Medicine

## 2017-04-28 VITALS — BP 102/62 | HR 98 | Temp 97.5°F | Resp 16 | Ht 71.0 in | Wt 218.0 lb

## 2017-04-28 DIAGNOSIS — J189 Pneumonia, unspecified organism: Secondary | ICD-10-CM

## 2017-04-28 DIAGNOSIS — R7989 Other specified abnormal findings of blood chemistry: Secondary | ICD-10-CM | POA: Diagnosis not present

## 2017-04-28 DIAGNOSIS — J441 Chronic obstructive pulmonary disease with (acute) exacerbation: Secondary | ICD-10-CM | POA: Diagnosis not present

## 2017-04-28 LAB — BASIC METABOLIC PANEL
BUN/Creatinine Ratio: 13 (ref 10–24)
BUN: 17 mg/dL (ref 8–27)
CO2: 25 mmol/L (ref 20–29)
Calcium: 10 mg/dL (ref 8.6–10.2)
Chloride: 101 mmol/L (ref 96–106)
Creatinine, Ser: 1.28 mg/dL — ABNORMAL HIGH (ref 0.76–1.27)
GFR calc Af Amer: 63 mL/min/{1.73_m2} (ref >59)
GFR calc non Af Amer: 55 mL/min/{1.73_m2} — ABNORMAL LOW (ref >59)
Glucose: 108 mg/dL — ABNORMAL HIGH (ref 65–99)
Potassium: 4.6 mmol/L (ref 3.5–5.2)
Sodium: 140 mmol/L (ref 134–144)

## 2017-04-28 MED ORDER — PREDNISONE 10 MG PO TABS: ORAL_TABLET | ORAL | 0 refills | Status: DC

## 2017-04-28 NOTE — Progress Notes (Addendum)
Brandon [name] is a 76 y.o. male who presents to Primary Care at [facility] today for FU for PNA:  1.  PNA:  Patient seen here yesterday for CAP.  Initially hypotensive, but BP normalized after rechecks.  Discussion about going to hospital, which he refused.  Given Rocephin and Levaquin and returned today for recheck.  States "I feel 100% better."  Slept well last night without coughing for first time in several days.  No fevers/no chills.  No lightheadedness.  He actually went in to work today.  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  When he does cough it is dry and infrequent.  Breathing is at his baseline.  No confusion.  No chest pain.    Eating and drinking well. Had 1 cup of coffee and bottle of water today.  States he usually has cup of coffee, tea at lunch, drink at dinner.  Otherwise doesn't drink fluids during the day.    ROS as above.    PMH reviewed. Patient is a nonsmoker.   Past Medical History:  Diagnosis Date  . AAA (abdominal aortic aneurysm) ([place])   . COPD (chronic obstructive pulmonary disease) ([place])   . DIC (disseminated intravascular coagulation) ([place]) 09/09/2012  . Leg wound, left 12/12/2013  . Peripheral vascular disease ([place])   . Respiratory failure, post-operative ([place]) 09/09/2012  . Shortness of breath    Past Surgical History:  Procedure Laterality Date  . ABDOMINAL AORTIC ANEURYSM REPAIR  09/08/2012   Procedure: ANEURYSM ABDOMINAL AORTIC REPAIR;  Surgeon: [name], MD;  Location: [place];  Service: Vascular;  Laterality: N/A;  open repair ruptured abdomenal aortic aneurysm  . GANGLION CYST EXCISION     Right wrist    Medications reviewed. Current Outpatient Prescriptions  Medication Sig Dispense Refill  . aspirin 81 MG tablet Take 81 mg by mouth daily.    Marland Kitchen atorvastatin (LIPITOR) 10 MG tablet     . escitalopram (LEXAPRO) 10 MG tablet Take 1 tablet by mouth daily.    Marland Kitchen ibuprofen (ADVIL,MOTRIN) 200 MG tablet Take 200 mg by mouth every 6  (six) hours as needed.    . irbesartan (AVAPRO) 150 MG tablet Take 1 tablet (150 mg total) by mouth daily. 30 tablet 11  . levofloxacin (LEVAQUIN) 500 MG tablet Take 1 tablet (500 mg total) by mouth daily. 7 tablet 0  . predniSONE (DELTASONE) 10 MG tablet Take 6 pills today, 5 pills tomorrow, 4 pills today after, 3 pills after, 2 pills after, 1 pill last day 21 tablet 0  . Tiotropium Bromide-Olodaterol (STIOLTO RESPIMAT) 2.5-2.5 MCG/ACT AERS Inhale 2 puffs into the lungs daily. 1 Inhaler 0   No current facility-administered medications for this visit.      Physical Exam:  BP 102/62   Pulse 98   Temp 97.5 F (36.4 C) (Oral)   Resp 16   Ht '5\' 11"'$  (1.803 m)   Wt 218 lb (98.9 kg)   SpO2 95%   BMI 30.40 kg/m  Gen:  Alert, cooperative patient who appears stated age in no acute distress.  Vital signs reviewed. HEENT: EOMI,  MMM Neck:  No JVD Pulm:  Good aeration.  Does have some mild rhonchi BL bases, which wasn't there yesterday.   Cardiac:  Regular rate and rhythm without murmur auscultated.  Good S1/S2. Abd:  Soft/nondistended/nontender.  Obese. Exts: Non edematous BL  LE, warm and well perfused.  Neuro:  Fully alert and oriented.  Walking around room  well.  No lightheaded.  Joking and laughing with me.    Assessment and Plan:  1.  CAP: - much better today. Continue levaquin. - ordered him not to work any more today or tomorrow.  I'm concerned he will become/stay dehydrated.  Encouraged further oral hydration - no more blood pressure medicines until he is re-evaluated on Monday - FU on Monday.  If worsens, go immediately to ED.  He expressed understanding.    2.  AKI: - secondary to dehydration and fact he took irbesartan while relatively hypotensive.   - rechecking serum creatinine today - making good urine.  No LE edema.  No confusion.    3.  COPD exacerbation: - Pt adamant he doesn't have COPD -- but radiographs consistent with this and very long-term smoker - PNA likely  has triggered this.  Started prednisone to prevent this from worsening.  Albuterol PRN - as above -- FU on Monday

## 2017-04-28 NOTE — Patient Instructions (Addendum)
It was good to see you again today.  I'm glad you're doing much better.   Take the prednisone as follow: Take 6 pills today, 5 pills tomorrow, 4 pills today after, 3 pills after, 2 pills after, 1 pill last day.  Do not take any more blood pressure medicine until you come back to see [place] on Monday.    Keep taking the Levaquin antibiotic.    Make sure you're staying hydrated.  Do not go to work today or tomorrow.  If you start having any worsening breathing, generally feeling worse, fevers and chills, feeling sick on your stomach, come back or go to the ED immediately.      IF you received an x-ray today, you will receive an invoice from [facility]. Please contact [facility] at [phone] with questions or concerns regarding your invoice.   IF you received labwork today, you will receive an invoice from [place]. Please contact [facility] at [phone] with questions or concerns regarding your invoice.   Our billing staff will not be able to assist you with questions regarding bills from these companies.  You will be contacted with the lab results as soon as they are available. The fastest way to get your results is to activate your My Chart account. Instructions are located on the last page of this paperwork. If you have not heard from [place] regarding the results in 2 weeks, please contact this office.

## 2017-04-29 LAB — CBC
Hematocrit: 43.2 % (ref 37.5–51.0)
Hemoglobin: 14.6 g/dL (ref 13.0–17.7)
MCH: 32.9 pg (ref 26.6–33.0)
MCHC: 33.8 g/dL (ref 31.5–35.7)
MCV: 97 fL (ref 79–97)
Platelets: 273 10*3/uL (ref 150–379)
RBC: 4.44 x10E6/uL (ref 4.14–5.80)
RDW: 13.4 % (ref 12.3–15.4)
WBC: 14 10*3/uL — ABNORMAL HIGH (ref 3.4–10.8)

## 2017-04-29 LAB — BASIC METABOLIC PANEL
BUN/Creatinine Ratio: 21 (ref 10–24)
BUN: 21 mg/dL (ref 8–27)
CO2: 23 mmol/L (ref 20–29)
Calcium: 9.5 mg/dL (ref 8.6–10.2)
Chloride: 99 mmol/L (ref 96–106)
Creatinine, Ser: 1.01 mg/dL (ref 0.76–1.27)
GFR calc Af Amer: 84 mL/min/{1.73_m2} (ref >59)
GFR calc non Af Amer: 73 mL/min/{1.73_m2} (ref >59)
Glucose: 90 mg/dL (ref 65–99)
Potassium: 4.4 mmol/L (ref 3.5–5.2)
Sodium: 140 mmol/L (ref 134–144)

## 2017-05-02 ENCOUNTER — Encounter: Payer: Self-pay | Admitting: Emergency Medicine

## 2017-05-02 ENCOUNTER — Ambulatory Visit (INDEPENDENT_AMBULATORY_CARE_PROVIDER_SITE_OTHER): Payer: Medicare HMO | Admitting: Emergency Medicine

## 2017-05-02 VITALS — BP 122/62 | HR 61 | Temp 97.8°F | Resp 18 | Ht 71.0 in | Wt 219.4 lb

## 2017-05-02 DIAGNOSIS — J189 Pneumonia, unspecified organism: Secondary | ICD-10-CM | POA: Diagnosis not present

## 2017-05-02 NOTE — Progress Notes (Signed)
Brandon [name] 76 y.o.   Chief Complaint  Patient presents with  . Pneumonia    follow up     HISTORY OF PRESENT ILLNESS: This is a 76 y.o. male here for f/u of pneumonia; feels better; no complaints and no new symptoms.  HPI   Prior to Admission medications   Medication Sig Start Date End Date Taking? Authorizing Provider  aspirin 81 MG tablet Take 81 mg by mouth daily.   Yes [provider]  atorvastatin (LIPITOR) 10 MG tablet  04/10/17  Yes [provider]  escitalopram (LEXAPRO) 10 MG tablet Take 1 tablet by mouth daily. 12/25/16  Yes [provider]  ibuprofen (ADVIL,MOTRIN) 200 MG tablet Take 200 mg by mouth every 6 (six) hours as needed.   Yes [provider]  irbesartan (AVAPRO) 150 MG tablet Take 1 tablet (150 mg total) by mouth daily. 01/04/17  Yes Tanda Rockers, MD  levofloxacin (LEVAQUIN) 500 MG tablet Take 1 tablet (500 mg total) by mouth daily. 04/27/17  Yes Alveda Reasons, MD  predniSONE (DELTASONE) 10 MG tablet Take 6 pills today, 5 pills tomorrow, 4 pills today after, 3 pills after, 2 pills after, 1 pill last day 04/28/17  Yes Alveda Reasons, MD  Tiotropium Bromide-Olodaterol (STIOLTO RESPIMAT) 2.5-2.5 MCG/ACT AERS Inhale 2 puffs into the lungs daily. 01/04/17  Yes Tanda Rockers, MD    No Known Allergies  Patient Active Problem List   Diagnosis Date Noted  . Dyspnea on exertion 01/04/2017  . Morbid obesity due to excess calories ([place]) 01/04/2017  . Essential hypertension 12/05/2015  . Peripheral vascular disease, unspecified ([place]) 03/12/2014  . Atherosclerosis of native arteries of the extremities with intermittent claudication 03/12/2014  . Physical deconditioning 09/19/2012  . Chronic obstructive pulmonary disease with bronchospasm ([place]) 09/11/2012  . AAA (abdominal aortic aneurysm, ruptured) ([place]) 09/09/2012  . Hyperglycemia 09/09/2012  . Former tobacco use 09/08/2012    Past Medical History:  Diagnosis Date  .  AAA (abdominal aortic aneurysm) ([place])   . COPD (chronic obstructive pulmonary disease) ([place])   . DIC (disseminated intravascular coagulation) ([place]) 09/09/2012  . Leg wound, left 12/12/2013  . Peripheral vascular disease ([place])   . Respiratory failure, post-operative ([place]) 09/09/2012  . Shortness of breath     Past Surgical History:  Procedure Laterality Date  . ABDOMINAL AORTIC ANEURYSM REPAIR  09/08/2012   Procedure: ANEURYSM ABDOMINAL AORTIC REPAIR;  Surgeon: [name], MD;  Location: [place];  Service: Vascular;  Laterality: N/A;  open repair ruptured abdomenal aortic aneurysm  . GANGLION CYST EXCISION     Right wrist    Social History   Social History  . Marital status: Married    Spouse name: N/A  . Number of children: N/A  . Years of education: N/A   Occupational History  . Not on file.   Social History Main Topics  . Smoking status: Former Smoker    Packs/day: 1.00    Years: 60.00    Quit date: 09/08/2012  . Smokeless tobacco: Never Used  . Alcohol use No  . Drug use: No  . Sexual activity: Not Currently    Birth control/ protection: None   Other Topics Concern  . Not on file   Social History Narrative  . No narrative on file    Family History  Problem Relation Age of Onset  . Dementia Mother   . Cancer Mother   . Cancer Father   . Heart disease Father  Review of Systems  Constitutional: Negative.  Negative for chills and fever.  HENT: Negative.  Negative for sore throat.   Eyes: Negative.  Negative for discharge and redness.  Respiratory: Negative.  Negative for cough and shortness of breath.   Cardiovascular: Negative.  Negative for chest pain.  Gastrointestinal: Negative.  Negative for abdominal pain, diarrhea, nausea and vomiting.  Genitourinary: Negative.  Negative for hematuria.  Musculoskeletal: Negative.  Negative for back pain, myalgias and neck pain.  Skin: Negative.  Negative for rash.  Neurological: Negative for dizziness and  headaches.  Endo/Heme/Allergies: Negative.   All other systems reviewed and are negative.   Vitals:   05/02/17 1728  BP: 122/62  Pulse: 61  Resp: 18  Temp: 97.8 F (36.6 C)    Physical Exam  Constitutional: He is oriented to person, place, and time. He appears well-developed and well-nourished.  HENT:  Head: Normocephalic and atraumatic.  Nose: Nose normal.  Mouth/Throat: Oropharynx is clear and moist. No oropharyngeal exudate.  Eyes: Conjunctivae and EOM are normal. Pupils are equal, round, and reactive to light.  Neck: Normal range of motion. Neck supple. No JVD present. No thyromegaly present.  Cardiovascular: Normal rate, regular rhythm and normal heart sounds.   Pulmonary/Chest: Effort normal and breath sounds normal.  Abdominal: Soft. Bowel sounds are normal. He exhibits no distension. There is no tenderness.  Musculoskeletal: Normal range of motion.  Lymphadenopathy:    He has no cervical adenopathy.  Neurological: He is alert and oriented to person, place, and time. No sensory deficit. He exhibits normal muscle tone.  Skin: Skin is warm and dry. Capillary refill takes less than 2 seconds. No rash noted.  Psychiatric: He has a normal mood and affect. His behavior is normal.  Vitals reviewed.    ASSESSMENT & PLAN: Brandon [name] was seen today for pneumonia.  Diagnoses and all orders for this visit:  Pneumonia due to infectious organism, unspecified laterality, unspecified part of lung Comments: improving    Patient Instructions    Continue and finish antibiotic. Community-Acquired Pneumonia, Adult Pneumonia is an infection of the lungs. One type of pneumonia can happen while a person is in a hospital. A different type can happen when a person is not in a hospital (community-acquired pneumonia). It is easy for this kind to spread from person to person. It can spread to you if you breathe near an infected person who coughs or sneezes. Some symptoms include:  A dry  cough.  A wet (productive) cough.  Fever.  Sweating.  Chest pain.  Follow these instructions at home:  Take over-the-counter and prescription medicines only as told by your doctor. ? Only take cough medicine if you are losing sleep. ? If you were prescribed an antibiotic medicine, take it as told by your doctor. Do not stop taking the antibiotic even if you start to feel better.  Sleep with your head and neck raised (elevated). You can do this by putting a few pillows under your head, or you can sleep in a recliner.  Do not use tobacco products. These include cigarettes, chewing tobacco, and e-cigarettes. If you need help quitting, ask your doctor.  Drink enough water to keep your pee (urine) clear or pale yellow. A shot (vaccine) can help prevent pneumonia. Shots are often suggested for:  People older than 74 years of age.  People older than 74 years of age: ? Who are having cancer treatment. ? Who have long-term (chronic) lung disease. ? Who have problems with their  body's defense system (immune system).  You may also prevent pneumonia if you take these actions:  Get the flu (influenza) shot every year.  Go to the dentist as often as told.  Wash your hands often. If soap and water are not available, use hand sanitizer.  Contact a doctor if:  You have a fever.  You lose sleep because your cough medicine does not help. Get help right away if:  You are short of breath and it gets worse.  You have more chest pain.  Your sickness gets worse. This is very serious if: ? You are an older adult. ? Your body's defense system is weak.  You cough up blood. This information is not intended to replace advice given to you by your health care provider. Make sure you discuss any questions you have with your health care provider. Document Released: 04/19/2008 Document Revised: 04/08/2016 Document Reviewed: 02/26/2015 Elsevier Interactive Patient Education  2018 [employer].    IF you received an x-ray today, you will receive an invoice from [facility] Rad[facility]. Please contact [facility] Radiology at [phone] with questions or concerns regarding your invoice.   IF you received labwork today, you will receive an invoice from [place]. Please contact [facility] at [phone] with questions or concerns regarding your invoice.   Our billing staff will not be able to assist you with questions regarding bills from these companies.  You will be contacted with the lab results as soon as they are available. The fastest way to get your results is to activate your My Chart account. Instructions are located on the last page of this paperwork. If you have not heard from [place] regarding the results in 2 weeks, please contact this office.          Agustina Caroli, MD [facility]

## 2017-05-02 NOTE — Patient Instructions (Addendum)
  Continue and finish antibiotic. Community-Acquired Pneumonia, Adult Pneumonia is an infection of the lungs. One type of pneumonia can happen while a person is in a hospital. A different type can happen when a person is not in a hospital (community-acquired pneumonia). It is easy for this kind to spread from person to person. It can spread to you if you breathe near an infected person who coughs or sneezes. Some symptoms include:  A dry cough.  A wet (productive) cough.  Fever.  Sweating.  Chest pain.  Follow these instructions at home:  Take over-the-counter and prescription medicines only as told by your doctor. ? Only take cough medicine if you are losing sleep. ? If you were prescribed an antibiotic medicine, take it as told by your doctor. Do not stop taking the antibiotic even if you start to feel better.  Sleep with your head and neck raised (elevated). You can do this by putting a few pillows under your head, or you can sleep in a recliner.  Do not use tobacco products. These include cigarettes, chewing tobacco, and e-cigarettes. If you need help quitting, ask your doctor.  Drink enough water to keep your pee (urine) clear or pale yellow. A shot (vaccine) can help prevent pneumonia. Shots are often suggested for:  People older than 74 years of age.  People older than 74 years of age: ? Who are having cancer treatment. ? Who have long-term (chronic) lung disease. ? Who have problems with their body's defense system (immune system).  You may also prevent pneumonia if you take these actions:  Get the flu (influenza) shot every year.  Go to the dentist as often as told.  Wash your hands often. If soap and water are not available, use hand sanitizer.  Contact a doctor if:  You have a fever.  You lose sleep because your cough medicine does not help. Get help right away if:  You are short of breath and it gets worse.  You have more chest pain.  Your sickness  gets worse. This is very serious if: ? You are an older adult. ? Your body's defense system is weak.  You cough up blood. This information is not intended to replace advice given to you by your health care provider. Make sure you discuss any questions you have with your health care provider. Document Released: 04/19/2008 Document Revised: 04/08/2016 Document Reviewed: 02/26/2015 Elsevier Interactive Patient Education  2018 [employer].    IF you received an x-ray today, you will receive an invoice from [facility] Radiology. Please contact [facility] Radiology at [phone] with questions or concerns regarding your invoice.   IF you received labwork today, you will receive an invoice from [place]. Please contact [facility] at [phone] with questions or concerns regarding your invoice.   Our billing staff will not be able to assist you with questions regarding bills from these companies.  You will be contacted with the lab results as soon as they are available. The fastest way to get your results is to activate your My Chart account. Instructions are located on the last page of this paperwork. If you have not heard from [place] regarding the results in 2 weeks, please contact this office.

## 2017-05-04 ENCOUNTER — Telehealth: Payer: Self-pay | Admitting: Family Medicine

## 2017-05-04 NOTE — Telephone Encounter (Signed)
Pt is calling to let Dr. Mingo Amber know he is out of his meds for the pnemonia and wants to know when to start back on his blood pressure meds   Best number  [phone]

## 2017-05-04 NOTE — Addendum Note (Signed)
Addended by: Lianne Cure A on: 05/04/2017 09:17 AM   Modules accepted: Orders

## 2017-05-05 NOTE — Telephone Encounter (Signed)
Pt is calling again seeking advise on his medication.  Please advise [phone]

## 2017-05-05 NOTE — Telephone Encounter (Signed)
Please advise. Do you want patient to come in for OV ? 

## 2017-05-09 NOTE — Telephone Encounter (Signed)
He should probably come back in to have his blood pressure checked.  It was fairly low at the last several visits, and he may not need the dose of medicines he was on.  Thanks!  JW

## 2017-05-16 ENCOUNTER — Ambulatory Visit (INDEPENDENT_AMBULATORY_CARE_PROVIDER_SITE_OTHER): Payer: Medicare HMO | Admitting: Family Medicine

## 2017-05-16 ENCOUNTER — Encounter: Payer: Self-pay | Admitting: Family Medicine

## 2017-05-16 VITALS — BP 130/72 | HR 64 | Temp 97.5°F | Resp 16 | Ht 71.0 in | Wt 215.2 lb

## 2017-05-16 DIAGNOSIS — R059 Cough, unspecified: Secondary | ICD-10-CM

## 2017-05-16 DIAGNOSIS — R05 Cough: Secondary | ICD-10-CM | POA: Diagnosis not present

## 2017-05-16 DIAGNOSIS — J189 Pneumonia, unspecified organism: Secondary | ICD-10-CM | POA: Diagnosis not present

## 2017-05-16 NOTE — Patient Instructions (Addendum)
Your lungs sound okay today, and blood pressure is okay today. Your energy level should continue to improve slowly, cough should not worsen, and should not have shortness of breath or fevers. If any worsening, please return for recheck. Otherwise follow-up approximately July 19th for repeat chest x-ray.  Return to the clinic or go to the nearest emergency room if any of your symptoms worsen or new symptoms occur.  IF you received an x-ray today, you will receive an invoice from [facility] Radiology. Please contact [facility] Radiology at [phone] with questions or concerns regarding your invoice.   IF you received labwork today, you will receive an invoice from [place]. Please contact [facility] at [phone] with questions or concerns regarding your invoice.   Our billing staff will not be able to assist you with questions regarding bills from these companies.  You will be contacted with the lab results as soon as they are available. The fastest way to get your results is to activate your My Chart account. Instructions are located on the last page of this paperwork. If you have not heard from [place] regarding the results in 2 weeks, please contact this office.

## 2017-05-16 NOTE — Telephone Encounter (Signed)
Patient has office visit today 05/16/2017 with Dr. Carlota Raspberry

## 2017-05-16 NOTE — Progress Notes (Signed)
Subjective:  By signing my name below, I, Brandon [name], attest that this documentation has been prepared under the direction and in the presence of Brandon Ray, MD. Electronically Signed: [name], [place]. 05/16/2017 , 6:39 PM .  Patient was seen in Room 3 .   Patient ID: Brandon [name], male    DOB: [birth year], 76 y.o.   MRN: [id number] Chief Complaint  Patient presents with  . Follow-up    pneumonia    HPI [name] is a 76 y.o. male Here for follow up on pneumonia. He was previously treated by Dr. Mingo Amber, initially hypotensive, but normal after recheck at June 13th visit. He was treated with rocephin and Levaquin. Suspected COPD with exacerbation with pneumonia, treated with prednisone and albuterol. He was most recently seen by Dr. Mitchel Honour for follow up of pneumonia that was improving. His COPD is treated with Stiolto Respimat inhaler.   Patient states he's through with all of his medications and informs he's feeling great; although, he has an infrequent cough. He's still using his Stiolto Respimat inhaler 2 puffs every morning for his COPD. He denies any wheezing, shortness of breath, fever or dizziness. He mentions his energy level has been pretty low but "definitely better". He was out working a little bit today. 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 He had chest xray done on June 13th, which showed possible airspace disease/pneumonia within the lingula.   Patient Active Problem List   Diagnosis Date Noted  . Dyspnea on exertion 01/04/2017  . Morbid obesity due to excess calories ([place]) 01/04/2017  . Essential hypertension 12/05/2015  . Peripheral vascular disease, unspecified ([name]) 03/12/2014  . Atherosclerosis of native arteries of the extremities with intermittent claudication 03/12/2014  . Physical deconditioning 09/19/2012  . Chronic obstructive pulmonary disease with bronchospasm ([name]) 09/11/2012  . AAA (abdominal aortic aneurysm, ruptured)  ([name]) 09/09/2012  . Hyperglycemia 09/09/2012  . Former tobacco use 09/08/2012   Past Medical History:  Diagnosis Date  . AAA (abdominal aortic aneurysm) ([name])   . COPD (chronic obstructive pulmonary disease) ([name])   . DIC (disseminated intravascular coagulation) ([name]) 09/09/2012  . Leg wound, left 12/12/2013  . Peripheral vascular disease ([name])   . Respiratory failure, post-operative ([name]) 09/09/2012  . Shortness of breath    Past Surgical History:  Procedure Laterality Date  . ABDOMINAL AORTIC ANEURYSM REPAIR  09/08/2012   Procedure: ANEURYSM ABDOMINAL AORTIC REPAIR;  Surgeon: [name], MD;  Location: [place];  Service: Vascular;  Laterality: N/A;  open repair ruptured abdomenal aortic aneurysm  . GANGLION CYST EXCISION     Right wrist   No Known Allergies Prior to Admission medications   Medication Sig Start Date End Date Taking? Authorizing Provider  aspirin 81 MG tablet Take 81 mg by mouth daily.    [provider]  atorvastatin (LIPITOR) 10 MG tablet  04/10/17   [provider]  escitalopram (LEXAPRO) 10 MG tablet Take 1 tablet by mouth daily. 12/25/16   [provider]  ibuprofen (ADVIL,MOTRIN) 200 MG tablet Take 200 mg by mouth every 6 (six) hours as needed.    [provider]  irbesartan (AVAPRO) 150 MG tablet Take 1 tablet (150 mg total) by mouth daily. 01/04/17   Tanda Rockers, MD  levofloxacin (LEVAQUIN) 500 MG tablet Take 1 tablet (500 mg total) by mouth daily. 04/27/17   Alveda Reasons, MD  predniSONE (DELTASONE) 10 MG tablet Take 6 pills today, 5 pills  tomorrow, 4 pills today after, 3 pills after, 2 pills after, 1 pill last day 04/28/17   Alveda Reasons, MD  Tiotropium Bromide-Olodaterol (STIOLTO RESPIMAT) 2.5-2.5 MCG/ACT AERS Inhale 2 puffs into the lungs daily. 01/04/17   Tanda Rockers, MD   Social History   Social History  . Marital status: Married    Spouse name: N/A  . Number of children: N/A  . Years of education:  N/A   Occupational History  . Not on file.   Social History Main Topics  . Smoking status: Former Smoker    Packs/day: 1.00    Years: 60.00    Quit date: 09/08/2012  . Smokeless tobacco: Never Used  . Alcohol use No  . Drug use: No  . Sexual activity: Not Currently    Birth control/ protection: None   Other Topics Concern  . Not on file   Social History Narrative  . No narrative on file   Review of Systems  Constitutional: Negative for fatigue and unexpected weight change.  Eyes: Negative for visual disturbance.  Respiratory: Positive for cough. Negative for chest tightness, shortness of breath and wheezing.   Cardiovascular: Negative for chest pain, palpitations and leg swelling.  Gastrointestinal: Negative for abdominal pain and [name] in stool.  Neurological: Negative for dizziness, light-headedness and headaches.       Objective:   Physical Exam  Constitutional: He is oriented to person, place, and time. He appears well-developed and well-nourished.  HENT:  Head: Normocephalic and atraumatic.  Right Ear: Tympanic membrane, external ear and ear canal normal.  Left Ear: Tympanic membrane, external ear and ear canal normal.  Nose: No rhinorrhea.  Mouth/Throat: Oropharynx is clear and moist and mucous membranes are normal. No oropharyngeal exudate or posterior oropharyngeal erythema.  Eyes: Conjunctivae are normal. Pupils are equal, round, and reactive to light.  Neck: Neck supple.  Cardiovascular: Normal rate, regular rhythm, normal heart sounds and intact distal pulses.   No murmur heard. Pulmonary/Chest: Effort normal and breath sounds normal. He has no wheezes. He has no rhonchi. He has no rales.  Breath sounds are distant, but clear; no wheeze  Abdominal: Soft. There is no tenderness.  Lymphadenopathy:    He has no cervical adenopathy.  Neurological: He is alert and oriented to person, place, and time.  Skin: Skin is warm and dry. No rash noted.  Psychiatric:  He has a normal mood and affect. His behavior is normal.  Vitals reviewed.   Vitals:   05/16/17 1722  BP: 130/72  Pulse: 64  Resp: 16  Temp: 97.5 F (36.4 C)  TempSrc: Oral  SpO2: 95%  Weight: 215 lb 3.2 oz (97.6 kg)  Height: '5\' 11"'$  (1.803 m)      Assessment & Plan:  SHAMARCUS INGRAM is a 76 y.o. male Lingular pneumonia  Cough  Much improved, fatigue has improved, denies any recent cough worsening or fever. [name] pressure stable, and chronic cough stable with COPD.  -Continue Stiolto Respimat   -Return in approximately 3 weeks for repeat chest x-[name] to document clearing of lingular pneumonia.  -RTC precautions if any fever, cough, or other worsening symptoms in the interim. No orders of the defined types were placed in this encounter.  Patient Instructions   Your lungs sound okay today, and [name] pressure is okay today. Your energy level should continue to improve slowly, cough should not worsen, and should not have shortness of breath or fevers. If any worsening, please return for recheck. Otherwise follow-up  approximately July 19th for repeat chest x-[name].  Return to the clinic or go to the nearest emergency room if any of your symptoms worsen or new symptoms occur.  IF you received an x-[name] today, you will receive an invoice from [facility] Radiology. Please contact [facility] Radiology at [phone] with questions or concerns regarding your invoice.   IF you received labwork today, you will receive an invoice from [place]. Please contact [facility] at [phone] with questions or concerns regarding your invoice.   Our billing staff will not be able to assist you with questions regarding bills from these companies.  You will be contacted with the lab results as soon as they are available. The fastest way to get your results is to activate your My Chart account. Instructions are located on the last page of this paperwork. If you have not heard from [place] regarding the results  in 2 weeks, please contact this office.       I personally performed the services described in this documentation, which was scribed in my presence. The recorded information has been reviewed and considered for accuracy and completeness, addended by me as needed, and agree with information above.  Signed,   Brandon Ray, MD [facility] [place].  05/16/17 7:15 PM

## 2017-06-02 ENCOUNTER — Ambulatory Visit (INDEPENDENT_AMBULATORY_CARE_PROVIDER_SITE_OTHER): Payer: Medicare HMO

## 2017-06-02 ENCOUNTER — Ambulatory Visit (INDEPENDENT_AMBULATORY_CARE_PROVIDER_SITE_OTHER): Payer: Medicare HMO | Admitting: Family Medicine

## 2017-06-02 ENCOUNTER — Encounter: Payer: Self-pay | Admitting: Family Medicine

## 2017-06-02 VITALS — BP 125/80 | HR 71 | Temp 97.6°F | Resp 16 | Ht 71.0 in | Wt 216.6 lb

## 2017-06-02 DIAGNOSIS — R12 Heartburn: Secondary | ICD-10-CM

## 2017-06-02 DIAGNOSIS — J189 Pneumonia, unspecified organism: Secondary | ICD-10-CM

## 2017-06-02 MED ORDER — OMEPRAZOLE 20 MG PO CPDR: 20.0000 mg | DELAYED_RELEASE_CAPSULE | Freq: Every day | ORAL | 3 refills | Status: DC

## 2017-06-02 NOTE — Patient Instructions (Addendum)
Try taking omeprazole once per day and avoid heartburn triggers that are listed below. I will repeat your xray today.  If cough fits are not improving in the next 2 weeks, then I would like you to meet with Dr. Melvyn Novas.    Food Choices for Gastroesophageal Reflux Disease, Adult When you have gastroesophageal reflux disease (GERD), the foods you eat and your eating habits are very important. Choosing the right foods can help ease the discomfort of GERD. Consider working with a diet and nutrition specialist (dietitian) to help you make healthy food choices. What general guidelines should I follow? Eating plan  Choose healthy foods low in fat, such as fruits, vegetables, whole grains, low-fat dairy products, and lean meat, fish, and poultry.  Eat frequent, small meals instead of three large meals each day. Eat your meals slowly, in a relaxed setting. Avoid bending over or lying down until 2-3 hours after eating.  Limit high-fat foods such as fatty meats or fried foods.  Limit your intake of oils, butter, and shortening to less than 8 teaspoons each day.  Avoid the following: ? Foods that cause symptoms. These may be different for different people. Keep a food diary to keep track of foods that cause symptoms. ? Alcohol. ? Drinking large amounts of liquid with meals. ? Eating meals during the 2-3 hours before bed.  Cook foods using methods other than frying. This may include baking, grilling, or broiling. Lifestyle   Maintain a healthy weight. Ask your health care provider what weight is healthy for you. If you need to lose weight, work with your health care provider to do so safely.  Exercise for at least 30 minutes on 5 or more days each week, or as told by your health care provider.  Avoid wearing clothes that fit tightly around your waist and chest.  Do not use any products that contain nicotine or tobacco, such as cigarettes and e-cigarettes. If you need help quitting, ask your health  care provider.  Sleep with the head of your bed raised. Use a wedge under the mattress or blocks under the bed frame to raise the head of the bed.   *What foods are not recommended? The items listed may not be a complete list. Talk with your dietitian about what dietary choices are best for you. Grains Pastries or quick breads with added fat. [place] toast. Vegetables Deep fried vegetables. [place] fries. Any vegetables prepared with added fat. Any vegetables that cause symptoms. For some people this may include tomatoes and tomato products, chili peppers, onions and garlic, and horseradish. Fruits Any fruits prepared with added fat. Any fruits that cause symptoms. For some people this may include citrus fruits, such as oranges, grapefruit, pineapple, and lemons. Meats and other protein foods High-fat meats, such as fatty beef or pork, hot dogs, ribs, ham, sausage, salami and bacon. Fried meat or protein, including fried fish and fried chicken. Nuts and nut butters. Dairy Whole milk and chocolate milk. Sour cream. Cream. Ice cream. Cream cheese. Milk shakes. Beverages Coffee and tea, with or without caffeine. Carbonated beverages. Sodas. Energy drinks. Fruit juice made with acidic fruits (such as orange or grapefruit). Tomato juice. Alcoholic drinks. Fats and oils Butter. Margarine. Shortening. Ghee. Sweets and desserts Chocolate and cocoa. Donuts. Seasoning and other foods Pepper. Peppermint and spearmint. Any condiments, herbs, or seasonings that cause symptoms. For some people, this may include curry, hot sauce, or vinegar-based salad dressings. Summary  When you have gastroesophageal reflux disease (GERD), food  and lifestyle choices are very important to help ease the discomfort of GERD.  Eat frequent, small meals instead of three large meals each day. Eat your meals slowly, in a relaxed setting. Avoid bending over or lying down until 2-3 hours after eating.  Limit high-fat foods  such as fatty meat or fried foods. This information is not intended to replace advice given to you by your health care provider. Make sure you discuss any questions you have with your health care provider. Document Released: 11/01/2005 Document Revised: 11/02/2016 Document Reviewed: 11/02/2016 Elsevier Interactive Patient Education  2017 Elsevier Inc.   Cough, Adult Coughing is a reflex that clears your throat and your airways. Coughing helps to heal and protect your lungs. It is normal to cough occasionally, but a cough that happens with other symptoms or lasts a long time may be a sign of a condition that needs treatment. A cough may last only 2-3 weeks (acute), or it may last longer than 8 weeks (chronic). What are the causes? Coughing is commonly caused by:  Breathing in substances that irritate your lungs.  A viral or bacterial respiratory infection.  Allergies.  Asthma.  Postnasal drip.  Smoking.  Acid backing up from the stomach into the esophagus (gastroesophageal reflux).  Certain medicines.  Chronic lung problems, including COPD (or rarely, lung cancer).  Other medical conditions such as heart failure.  Follow these instructions at home: Pay attention to any changes in your symptoms. Take these actions to help with your discomfort:  Take medicines only as told by your health care provider. ? If you were prescribed an antibiotic medicine, take it as told by your health care provider. Do not stop taking the antibiotic even if you start to feel better. ? Talk with your health care provider before you take a cough suppressant medicine.  Drink enough fluid to keep your urine clear or pale yellow.  If the air is dry, use a cold steam vaporizer or humidifier in your bedroom or your home to help loosen secretions.  Avoid anything that causes you to cough at work or at home.  If your cough is worse at night, try sleeping in a semi-upright position.  Avoid cigarette  smoke. If you smoke, quit smoking. If you need help quitting, ask your health care provider.  Avoid caffeine.  Avoid alcohol.  Rest as needed.  Contact a health care provider if:  You have new symptoms.  You cough up pus.  Your cough does not get better after 2-3 weeks, or your cough gets worse.  You cannot control your cough with suppressant medicines and you are losing sleep.  You develop pain that is getting worse or pain that is not controlled with pain medicines.  You have a fever.  You have unexplained weight loss.  You have night sweats. Get help right away if:  You cough up blood.  You have difficulty breathing.  Your heartbeat is very fast. This information is not intended to replace advice given to you by your health care provider. Make sure you discuss any questions you have with your health care provider. Document Released: 04/30/2011 Document Revised: 04/08/2016 Document Reviewed: 01/08/2015 Elsevier Interactive Patient Education  2017 [employer].     IF you received an x-ray today, you will receive an invoice from [facility]. Please contact [facility] Radiology at [phone] with questions or concerns regarding your invoice.   IF you received labwork today, you will receive an invoice from [employer] [facility]. Please contact [facility]  at [phone] with questions or concerns regarding your invoice.   Our billing staff will not be able to assist you with questions regarding bills from these companies.  You will be contacted with the lab results as soon as they are available. The fastest way to get your results is to activate your My Chart account. Instructions are located on the last page of this paperwork. If you have not heard from [place] regarding the results in 2 weeks, please contact this office.

## 2017-06-02 NOTE — Progress Notes (Signed)
By signing my name below, I, Mesha Guinyard, attest that this documentation has been prepared under the direction and in the presence of [name], MD.  Electronically Signed: Verlee Monte, Medical Scribe. 06/02/17. 5:16 PM.  Subjective:    Patient ID: Brandon [name], male    DOB: 02/14/1941, 76 y.o.   MRN: [id number]  HPI Chief Complaint  Patient presents with  . Follow-up    pneumonia    HPI Comments: Brandon [name] is a 76 y.o. male who presents to Primary Care at [facility] for lingular pneumonia follow-up. Initially dx June 13th treated with levaquin and rocephin, he was still taking stiolto for COPD. Was feeling much improved with infrequent cough on July 2nd visit. Seen by pulmonologist, Dr. Melvyn Novas, Feb 2018 for chronic cough. Ace inhibitor stopped and strarted on Avapro instead. Based on that note it said that he did not have significant COPD.  He's using albuterol PRN, and stiolto 2 puffs every morning. Pt feels good and knows when to sit down. His strength is getting better, but it's not at 100%. He reports occasional "coughing spells" with intermittent white sputum that has improved since onset. Mentions he's had it since getting PNA. He's getting out and doing work. He also has heartburn that occurs more at night when he lays down. Pt eats a tomato sandwich at night regularly. He takes tums for relief of his sxs. He's been trying to improve his diet with more fruit, salads, and staying off of fried food and bread. Pt quit smoking Oct 25th after his aortic aneursym. Denies fever.  Patient Active Problem List   Diagnosis Date Noted  . Dyspnea on exertion 01/04/2017  . Morbid obesity due to excess calories ([place]) 01/04/2017  . Essential hypertension 12/05/2015  . Peripheral vascular disease, unspecified ([place]) 03/12/2014  . Atherosclerosis of native arteries of the extremities with intermittent claudication 03/12/2014  . Physical deconditioning 09/19/2012  . Chronic obstructive  pulmonary disease with bronchospasm ([place]) 09/11/2012  . AAA (abdominal aortic aneurysm, ruptured) ([place]) 09/09/2012  . Hyperglycemia 09/09/2012  . Former tobacco use 09/08/2012   Past Medical History:  Diagnosis Date  . AAA (abdominal aortic aneurysm) ([place])   . COPD (chronic obstructive pulmonary disease) ([place])   . DIC (disseminated intravascular coagulation) ([place]) 09/09/2012  . Leg wound, left 12/12/2013  . Peripheral vascular disease ([place])   . Respiratory failure, post-operative ([place]) 09/09/2012  . Shortness of breath    Past Surgical History:  Procedure Laterality Date  . ABDOMINAL AORTIC ANEURYSM REPAIR  09/08/2012   Procedure: ANEURYSM ABDOMINAL AORTIC REPAIR;  Surgeon: [name], MD;  Location: [facility];  Service: Vascular;  Laterality: N/A;  open repair ruptured abdomenal aortic aneurysm  . GANGLION CYST EXCISION     Right wrist   No Known Allergies Prior to Admission medications   Medication Sig Start Date End Date Taking? Authorizing Provider  aspirin 81 MG tablet Take 81 mg by mouth daily.   Yes [provider]  atorvastatin (LIPITOR) 10 MG tablet  04/10/17  Yes [provider]  escitalopram (LEXAPRO) 10 MG tablet Take 1 tablet by mouth daily. 12/25/16  Yes [provider]  irbesartan (AVAPRO) 150 MG tablet Take 1 tablet (150 mg total) by mouth daily. 01/04/17  Yes Tanda Rockers, MD  Tiotropium Bromide-Olodaterol (STIOLTO RESPIMAT) 2.5-2.5 MCG/ACT AERS Inhale 2 puffs into the lungs daily. 01/04/17  Yes Tanda Rockers, MD  ibuprofen (ADVIL,MOTRIN) 200 MG tablet Take 200 mg by mouth every  6 (six) hours as needed.    [provider]  levofloxacin (LEVAQUIN) 500 MG tablet Take 1 tablet (500 mg total) by mouth daily. Patient not taking: Reported on 06/02/2017 04/27/17   Alveda Reasons, MD  predniSONE (DELTASONE) 10 MG tablet Take 6 pills today, 5 pills tomorrow, 4 pills today after, 3 pills after, 2 pills after, 1 pill last day Patient not  taking: Reported on 06/02/2017 04/28/17   Alveda Reasons, MD   Social History   Social History  . Marital status: Married    Spouse name: N/A  . Number of children: N/A  . Years of education: N/A   Occupational History  . Not on file.   Social History Main Topics  . Smoking status: Former Smoker    Packs/day: 1.00    Years: 60.00    Quit date: 09/08/2012  . Smokeless tobacco: Never Used  . Alcohol use No  . Drug use: No  . Sexual activity: Not Currently    Birth control/ protection: None   Other Topics Concern  . Not on file   Social History Narrative  . No narrative on file   Review of Systems  Constitutional: Positive for fatigue (improving). Negative for fever.  Respiratory: Positive for cough and shortness of breath (chronic).    Objective:  Physical Exam  Constitutional: He appears well-developed and well-nourished. No distress.  HENT:  Head: Normocephalic and atraumatic.  Eyes: Conjunctivae are normal.  Neck: Neck supple.  Cardiovascular: Normal rate, regular rhythm and normal heart sounds.  Exam reveals no gallop and no friction rub.   No murmur heard. Pulmonary/Chest: Effort normal. No respiratory distress. He has no wheezes. He has no rhonchi. He has no rales.  Breath sounds distant but clear  Neurological: He is alert.  Skin: Skin is warm and dry.  Psychiatric: He has a normal mood and affect. His behavior is normal.  Nursing note and vitals reviewed.   Vitals:   06/02/17 1634  BP: 125/80  Pulse: 71  Resp: 16  Temp: 97.6 F (36.4 C)  TempSrc: Oral  SpO2: 95%  Weight: 216 lb 9.6 oz (98.2 kg)  Height: 5\' 11"  (1.803 m)   Body mass index is 30.21 kg/m.    Dg Chest 2 View  Result Date: 06/02/2017 CLINICAL DATA:  Lingular pneumonia. EXAM: CHEST  2 VIEW COMPARISON:  04/27/2017 FINDINGS: Lungs are hyperexpanded. Basilar scarring again noted. The cardio pericardial silhouette is enlarged. Patchy left parahilar airspace disease seen on the prior  study has resolved in the interval. The visualized bony structures of the thorax are intact. IMPRESSION: Interval resolution of left parahilar airspace disease. Electronically Signed   By: [name] Stanley M.D.   On: 06/02/2017 17:50   Assessment & Plan:    CHON BUHL is a 76 y.o. male Lingular pneumonia - Plan: DG Chest 2 View  - Chest x-ray improved. Suspect his coughing fits may be laryngeal pharyngeal reflux or heartburn related. Start omeprazole, avoid heartburn triggers, then if coughing fits are not improving in next 2 weeks, would recommend follow-up evaluation with pulmonary.  Heartburn - Plan: omeprazole (PRILOSEC) 20 MG capsule, DISCONTINUED: omeprazole (PRILOSEC) 20 MG capsule  - as above.   Meds ordered this encounter  Medications  . DISCONTD: omeprazole (PRILOSEC) 20 MG capsule    Sig: Take 1 capsule (20 mg total) by mouth daily.    Dispense:  30 capsule    Refill:  3  . omeprazole (PRILOSEC) 20 MG capsule  Sig: Take 1 capsule (20 mg total) by mouth daily.    Dispense:  30 capsule    Refill:  3   Patient Instructions   Try taking omeprazole once per day and avoid heartburn triggers that are listed below. I will repeat your xray today.  If cough fits are not improving in the next 2 weeks, then I would like you to meet with Dr. Melvyn Novas.    Food Choices for Gastroesophageal Reflux Disease, Adult When you have gastroesophageal reflux disease (GERD), the foods you eat and your eating habits are very important. Choosing the right foods can help ease the discomfort of GERD. Consider working with a diet and nutrition specialist (dietitian) to help you make healthy food choices. What general guidelines should I follow? Eating plan  Choose healthy foods low in fat, such as fruits, vegetables, whole grains, low-fat dairy products, and lean meat, fish, and poultry.  Eat frequent, small meals instead of three large meals each day. Eat your meals slowly, in a relaxed setting.  Avoid bending over or lying down until 2-3 hours after eating.  Limit high-fat foods such as fatty meats or fried foods.  Limit your intake of oils, butter, and shortening to less than 8 teaspoons each day.  Avoid the following: ? Foods that cause symptoms. These may be different for different people. Keep a food diary to keep track of foods that cause symptoms. ? Alcohol. ? Drinking large amounts of liquid with meals. ? Eating meals during the 2-3 hours before bed.  Cook foods using methods other than frying. This may include baking, grilling, or broiling. Lifestyle   Maintain a healthy weight. Ask your health care provider what weight is healthy for you. If you need to lose weight, work with your health care provider to do so safely.  Exercise for at least 30 minutes on 5 or more days each week, or as told by your health care provider.  Avoid wearing clothes that fit tightly around your waist and chest.  Do not use any products that contain nicotine or tobacco, such as cigarettes and e-cigarettes. If you need help quitting, ask your health care provider.  Sleep with the head of your bed raised. Use a wedge under the mattress or blocks under the bed frame to raise the head of the bed.   *What foods are not recommended? The items listed may not be a complete list. Talk with your dietitian about what dietary choices are best for you. Grains Pastries or quick breads with added fat. [place] toast. Vegetables Deep fried vegetables. [place] fries. Any vegetables prepared with added fat. Any vegetables that cause symptoms. For some people this may include tomatoes and tomato products, chili peppers, onions and garlic, and horseradish. Fruits Any fruits prepared with added fat. Any fruits that cause symptoms. For some people this may include citrus fruits, such as oranges, grapefruit, pineapple, and lemons. Meats and other protein foods High-fat meats, such as fatty beef or pork, hot dogs,  ribs, ham, sausage, salami and bacon. Fried meat or protein, including fried fish and fried chicken. Nuts and nut butters. Dairy Whole milk and chocolate milk. Sour cream. Cream. Ice cream. Cream cheese. Milk shakes. Beverages Coffee and tea, with or without caffeine. Carbonated beverages. Sodas. Energy drinks. Fruit juice made with acidic fruits (such as orange or grapefruit). Tomato juice. Alcoholic drinks. Fats and oils Butter. Margarine. Shortening. Ghee. Sweets and desserts Chocolate and cocoa. Donuts. Seasoning and other foods Pepper. Peppermint and spearmint. Any  condiments, herbs, or seasonings that cause symptoms. For some people, this may include curry, hot sauce, or vinegar-based salad dressings. Summary  When you have gastroesophageal reflux disease (GERD), food and lifestyle choices are very important to help ease the discomfort of GERD.  Eat frequent, small meals instead of three large meals each day. Eat your meals slowly, in a relaxed setting. Avoid bending over or lying down until 2-3 hours after eating.  Limit high-fat foods such as fatty meat or fried foods. This information is not intended to replace advice given to you by your health care provider. Make sure you discuss any questions you have with your health care provider. Document Released: 11/01/2005 Document Revised: 11/02/2016 Document Reviewed: 11/02/2016 Elsevier Interactive Patient Education  2017 Else[employer] Inc.   Cough, Adult Coughing is a reflex that clears your throat and your airways. Coughing helps to heal and protect your lungs. It is normal to cough occasionally, but a cough that happens with other symptoms or lasts a long time may be a sign of a condition that needs treatment. A cough may last only 2-3 weeks (acute), or it may last longer than 8 weeks (chronic). What are the causes? Coughing is commonly caused by:  Breathing in substances that irritate your lungs.  A viral or bacterial respiratory  infection.  Allergies.  Asthma.  Postnasal drip.  Smoking.  Acid backing up from the stomach into the esophagus (gastroesophageal reflux).  Certain medicines.  Chronic lung problems, including COPD (or rarely, lung cancer).  Other medical conditions such as heart failure.  Follow these instructions at home: Pay attention to any changes in your symptoms. Take these actions to help with your discomfort:  Take medicines only as told by your health care provider. ? If you were prescribed an antibiotic medicine, take it as told by your health care provider. Do not stop taking the antibiotic even if you start to feel better. ? Talk with your health care provider before you take a cough suppressant medicine.  Drink enough fluid to keep your urine clear or pale yellow.  If the air is dry, use a cold steam vaporizer or humidifier in your bedroom or your home to help loosen secretions.  Avoid anything that causes you to cough at work or at home.  If your cough is worse at night, try sleeping in a semi-upright position.  Avoid cigarette smoke. If you smoke, quit smoking. If you need help quitting, ask your health care provider.  Avoid caffeine.  Avoid alcohol.  Rest as needed.  Contact a health care provider if:  You have new symptoms.  You cough up pus.  Your cough does not get better after 2-3 weeks, or your cough gets worse.  You cannot control your cough with suppressant medicines and you are losing sleep.  You develop pain that is getting worse or pain that is not controlled with pain medicines.  You have a fever.  You have unexplained weight loss.  You have night sweats. Get help right away if:  You cough up blood.  You have difficulty breathing.  Your heartbeat is very fast. This information is not intended to replace advice given to you by your health care provider. Make sure you discuss any questions you have with your health care provider. Document  Released: 04/30/2011 Document Revised: 04/08/2016 Document Reviewed: 01/08/2015 Elsevier Interactive Patient Education  2017 [employer].     IF you received an x-ray today, you will receive an invoice from [facility] Radiology.  Please contact [facility] Radiology at [phone] with questions or concerns regarding your invoice.   IF you received labwork today, you will receive an invoice from [facility]. Please contact [facility] at [phone] with questions or concerns regarding your invoice.   Our billing staff will not be able to assist you with questions regarding bills from these companies.  You will be contacted with the lab results as soon as they are available. The fastest way to get your results is to activate your My Chart account. Instructions are located on the last page of this paperwork. If you have not heard from [place] regarding the results in 2 weeks, please contact this office.       I personally performed the services described in this documentation, which was scribed in my presence. The recorded information has been reviewed and considered for accuracy and completeness, addended by me as needed, and agree with information above.  Signed,   [name], MD Primary Care at [facility].  06/04/17 4:18 PM

## 2017-06-16 ENCOUNTER — Ambulatory Visit (INDEPENDENT_AMBULATORY_CARE_PROVIDER_SITE_OTHER): Payer: Medicare HMO | Admitting: Family Medicine

## 2017-06-16 ENCOUNTER — Encounter: Payer: Self-pay | Admitting: Family Medicine

## 2017-06-16 VITALS — BP 132/71 | HR 55 | Temp 98.0°F | Resp 16 | Ht 71.0 in | Wt 222.2 lb

## 2017-06-16 DIAGNOSIS — J00 Acute nasopharyngitis [common cold]: Secondary | ICD-10-CM | POA: Diagnosis not present

## 2017-06-16 DIAGNOSIS — R05 Cough: Secondary | ICD-10-CM | POA: Diagnosis not present

## 2017-06-16 DIAGNOSIS — R12 Heartburn: Secondary | ICD-10-CM | POA: Diagnosis not present

## 2017-06-16 DIAGNOSIS — R059 Cough, unspecified: Secondary | ICD-10-CM

## 2017-06-16 MED ORDER — MUPIROCIN 2 % EX OINT: 1.0000 "application " | TOPICAL_OINTMENT | Freq: Two times a day (BID) | CUTANEOUS | 0 refills | Status: DC

## 2017-06-16 NOTE — Progress Notes (Signed)
By signing my name below, I, Mesha Guinyard, attest that this documentation has been prepared under the direction and in the presence of [name], MD.  Electronically Signed: Verlee Monte, Medical Scribe. 06/16/17. 5:56 PM.  Subjective:    Patient ID: Brandon [name], male    DOB: [birth year], 76 y.o.   MRN: [id number]  HPI Chief Complaint  Patient presents with  . Pneumonia    FOLLOW UP    HPI Comments: Brandon [name] is a 76 y.o. male who presents to Primary Care at [facility] for PNA follow-up. He was last seen July 19th. He had persistent cough at that time. X-ray showed interval resolution of previous infiltrate. We started omeprazole for possible LPR/heartburn related cough. Heartburn triggers were also printed on his AVS.  Cough/GERD: Reports his cough and the white phlem that he would spit up is "a whole lot better" and has decreased. Pt eats a lot of vegetables, and frequently has tomatoe sandwiches with chips. He uses the [employer] low acid tomatoes for his sandwiches and suspects he eats more chips than he should with his sandwiches. He cut out biscuits, potatoes, and fried foods. Denies recent heartburn, or fevers.  Right Nostril: Suspects he has a infected naisl hair. Denies cutting it. Noticed recently.   Mole: Notes a mole on his neck that gets irritated when his seat belt rubs on it.  Patient Active Problem List   Diagnosis Date Noted  . Dyspnea on exertion 01/04/2017  . Morbid obesity due to excess calories ([place]) 01/04/2017  . Essential hypertension 12/05/2015  . Peripheral vascular disease, unspecified ([place]) 03/12/2014  . Atherosclerosis of native arteries of the extremities with intermittent claudication 03/12/2014  . Physical deconditioning 09/19/2012  . Chronic obstructive pulmonary disease with bronchospasm ([place]) 09/11/2012  . AAA (abdominal aortic aneurysm, ruptured) ([place]) 09/09/2012  . Hyperglycemia 09/09/2012  . Former tobacco use 09/08/2012    Past Medical History:  Diagnosis Date  . AAA (abdominal aortic aneurysm) ([place])   . COPD (chronic obstructive pulmonary disease) ([place])   . DIC (disseminated intravascular coagulation) ([place]) 09/09/2012  . Leg wound, left 12/12/2013  . Peripheral vascular disease ([place])   . Respiratory failure, post-operative ([place]) 09/09/2012  . Shortness of breath    Past Surgical History:  Procedure Laterality Date  . ABDOMINAL AORTIC ANEURYSM REPAIR  09/08/2012   Procedure: ANEURYSM ABDOMINAL AORTIC REPAIR;  Surgeon: [name], MD;  Location: [facility];  Service: Vascular;  Laterality: N/A;  open repair ruptured abdomenal aortic aneurysm  . GANGLION CYST EXCISION     Right wrist   No Known Allergies Prior to Admission medications   Medication Sig Start Date End Date Taking? Authorizing Provider  atorvastatin (LIPITOR) 10 MG tablet  04/10/17  Yes [provider]  ibuprofen (ADVIL,MOTRIN) 200 MG tablet Take 200 mg by mouth every 6 (six) hours as needed.   Yes [provider]  irbesartan (AVAPRO) 150 MG tablet Take 1 tablet (150 mg total) by mouth daily. 01/04/17  Yes Tanda Rockers, MD  omeprazole (PRILOSEC) 20 MG capsule Take 1 capsule (20 mg total) by mouth daily. 06/02/17  Yes [name], MD  Tiotropium Bromide-Olodaterol (STIOLTO RESPIMAT) 2.5-2.5 MCG/ACT AERS Inhale 2 puffs into the lungs daily. 01/04/17  Yes Tanda Rockers, MD  aspirin 81 MG tablet Take 81 mg by mouth daily.    [provider]  escitalopram (LEXAPRO) 10 MG tablet Take 1 tablet by mouth daily. 12/25/16   [provider]  Social History   Social History  . Marital status: Married    Spouse name: N/A  . Number of children: N/A  . Years of education: N/A   Occupational History  . Not on file.   Social History Main Topics  . Smoking status: Former Smoker    Packs/day: 1.00    Years: 60.00    Quit date: 09/08/2012  . Smokeless tobacco: Never Used  . Alcohol use No  . Drug use: No   . Sexual activity: Not Currently    Birth control/ protection: None   Other Topics Concern  . Not on file   Social History Narrative  . No narrative on file   Review of Systems  Constitutional: Negative for fever.  Respiratory: Positive for cough.   Cardiovascular: Negative for chest pain (GERD).   Objective:  Physical Exam  Constitutional: He appears well-developed and well-nourished. No distress.  HENT:  Head: Normocephalic and atraumatic.  Nose: Right sinus exhibits no maxillary sinus tenderness and no frontal sinus tenderness. Left sinus exhibits no maxillary sinus tenderness and no frontal sinus tenderness.  Mouth/Throat: No oropharyngeal exudate.  Inside of nostril on right side there was one small slightly irritated/inflamed No external swelling, erythema, or discharge  Eyes: Conjunctivae are normal.  Neck: Neck supple.  Cardiovascular: Normal rate and normal heart sounds.  Exam reveals no gallop and no friction rub.   No murmur heard. Pulmonary/Chest: Effort normal and breath sounds normal. No respiratory distress. He has no wheezes. He has no rhonchi. He has no rales.  Lymphadenopathy:    He has no cervical adenopathy.  Neurological: He is alert.  Skin: Skin is warm and dry.  Psychiatric: He has a normal mood and affect. His behavior is normal.  Nursing note and vitals reviewed.   Vitals:   06/16/17 1705  BP: 132/71  Pulse: (!) 55  Resp: 16  Temp: 98 F (36.7 C)  TempSrc: Oral  SpO2: 96%  Weight: 222 lb 3.2 oz (100.8 kg)  Height: '5\' 11"'$  (1.803 m)  Body mass index is 30.99 kg/m. Assessment & Plan:    Brandon [name] is a 76 y.o. male Acute rhinitis - Plan: mupirocin ointment (BACTROBAN) 2 %  -Nasal irritation just inside right nostril. Possible infected hair. No external lesions, no apparent external swelling or erythema. Trial of Bactroban ointment twice per day, RTC precautions if worsening.   Cough Heartburn  - Status post treatment for pneumonia.  Clearing of infiltrate last visit, but persistent coughing fits. Now significantly improved. Suspect reflux/[place] pharyngeal reflux as significant contributor.  - Handout given again on foods to avoid, continue omeprazole daily for now.  - If cough is not improving in 4-6 weeks, return for recheck, otherwise follow-up in 3 months with primary care provider or here to discuss reflux medication and duration of treatment.   Meds ordered this encounter  Medications  . mupirocin ointment (BACTROBAN) 2 %    Sig: Place 1 application into the nose 2 (two) times daily.    Dispense:  22 g    Refill:  0   Patient Instructions    Continue omeprazole once per day.  If cough is not continuing to improve in next 3-4 weeks, would recommend follow up with Dr. Melvyn Novas. See foods below again to try to avoid common causes of heartburn.    Recheck in next 3 months with your primary care provider or here if needed for  Heartburn.   Food Choices for Gastroesophageal Reflux Disease, Adult  When you have gastroesophageal reflux disease (GERD), the foods you eat and your eating habits are very important. Choosing the right foods can help ease the discomfort of GERD. Consider working with a diet and nutrition specialist (dietitian) to help you make healthy food choices. What general guidelines should I follow? Eating plan  Choose healthy foods low in fat, such as fruits, vegetables, whole grains, low-fat dairy products, and lean meat, fish, and poultry.  Eat frequent, small meals instead of three large meals each day. Eat your meals slowly, in a relaxed setting. Avoid bending over or lying down until 2-3 hours after eating.  Limit high-fat foods such as fatty meats or fried foods.  Limit your intake of oils, butter, and shortening to less than 8 teaspoons each day.  Avoid the following: ? Foods that cause symptoms. These may be different for different people. Keep a food diary to keep track of foods that cause  symptoms. ? Alcohol. ? Drinking large amounts of liquid with meals. ? Eating meals during the 2-3 hours before bed.  Cook foods using methods other than frying. This may include baking, grilling, or broiling. Lifestyle   Maintain a healthy weight. Ask your health care provider what weight is healthy for you. If you need to lose weight, work with your health care provider to do so safely.  Exercise for at least 30 minutes on 5 or more days each week, or as told by your health care provider.  Avoid wearing clothes that fit tightly around your waist and chest.  Do not use any products that contain nicotine or tobacco, such as cigarettes and e-cigarettes. If you need help quitting, ask your health care provider.  Sleep with the head of your bed raised. Use a wedge under the mattress or blocks under the bed frame to raise the head of the bed. What foods are not recommended? The items listed may not be a complete list. Talk with your dietitian about what dietary choices are best for you. Grains Pastries or quick breads with added fat. [place] toast. Vegetables Deep fried vegetables. [place] fries. Any vegetables prepared with added fat. Any vegetables that cause symptoms. For some people this may include tomatoes and tomato products, chili peppers, onions and garlic, and horseradish. Fruits Any fruits prepared with added fat. Any fruits that cause symptoms. For some people this may include citrus fruits, such as oranges, grapefruit, pineapple, and lemons. Meats and other protein foods High-fat meats, such as fatty beef or pork, hot dogs, ribs, ham, sausage, salami and bacon. Fried meat or protein, including fried fish and fried chicken. Nuts and nut butters. Dairy Whole milk and chocolate milk. Sour cream. Cream. Ice cream. Cream cheese. Milk shakes. Beverages Coffee and tea, with or without caffeine. Carbonated beverages. Sodas. Energy drinks. Fruit juice made with acidic fruits (such as  orange or grapefruit). Tomato juice. Alcoholic drinks. Fats and oils Butter. Margarine. Shortening. Ghee. Sweets and desserts Chocolate and cocoa. Donuts. Seasoning and other foods Pepper. Peppermint and spearmint. Any condiments, herbs, or seasonings that cause symptoms. For some people, this may include curry, hot sauce, or vinegar-based salad dressings. Summary  When you have gastroesophageal reflux disease (GERD), food and lifestyle choices are very important to help ease the discomfort of GERD.  Eat frequent, small meals instead of three large meals each day. Eat your meals slowly, in a relaxed setting. Avoid bending over or lying down until 2-3 hours after eating.  Limit high-fat foods such as fatty meat or  fried foods. This information is not intended to replace advice given to you by your health care provider. Make sure you discuss any questions you have with your health care provider. Document Released: 11/01/2005 Document Revised: 11/02/2016 Document Reviewed: 11/02/2016 Elsevier Interactive Patient Education  2017 [employer].   IF you received an x-ray today, you will receive an invoice from [facility]. Please contact [facility] at [phone] with questions or concerns regarding your invoice.   IF you received labwork today, you will receive an invoice from [facility]. Please contact [facility] at [phone] with questions or concerns regarding your invoice.   Our billing staff will not be able to assist you with questions regarding bills from these companies.  You will be contacted with the lab results as soon as they are available. The fastest way to get your results is to activate your My Chart account. Instructions are located on the last page of this paperwork. If you have not heard from [place] regarding the results in 2 weeks, please contact this office.      I personally performed the services described in this documentation, which was scribed in my  presence. The recorded information has been reviewed and considered for accuracy and completeness, addended by me as needed, and agree with information above.  Signed,   [name], MD Primary [facility] at [facility].  06/16/17 6:09 PM

## 2017-06-16 NOTE — Patient Instructions (Addendum)
Continue omeprazole once per day.  If cough is not continuing to improve in next 3-4 weeks, would recommend follow up with Dr. Melvyn Novas. See foods below again to try to avoid common causes of heartburn.    Recheck in next 3 months with your primary care provider or here if needed for  Heartburn.   Food Choices for Gastroesophageal Reflux Disease, Adult When you have gastroesophageal reflux disease (GERD), the foods you eat and your eating habits are very important. Choosing the right foods can help ease the discomfort of GERD. Consider working with a diet and nutrition specialist (dietitian) to help you make healthy food choices. What general guidelines should I follow? Eating plan  Choose healthy foods low in fat, such as fruits, vegetables, whole grains, low-fat dairy products, and lean meat, fish, and poultry.  Eat frequent, small meals instead of three large meals each day. Eat your meals slowly, in a relaxed setting. Avoid bending over or lying down until 2-3 hours after eating.  Limit high-fat foods such as fatty meats or fried foods.  Limit your intake of oils, butter, and shortening to less than 8 teaspoons each day.  Avoid the following: ? Foods that cause symptoms. These may be different for different people. Keep a food diary to keep track of foods that cause symptoms. ? Alcohol. ? Drinking large amounts of liquid with meals. ? Eating meals during the 2-3 hours before bed.  Cook foods using methods other than frying. This may include baking, grilling, or broiling. Lifestyle   Maintain a healthy weight. Ask your health care provider what weight is healthy for you. If you need to lose weight, work with your health care provider to do so safely.  Exercise for at least 30 minutes on 5 or more days each week, or as told by your health care provider.  Avoid wearing clothes that fit tightly around your waist and chest.  Do not use any products that contain nicotine or tobacco,  such as cigarettes and e-cigarettes. If you need help quitting, ask your health care provider.  Sleep with the head of your bed raised. Use a wedge under the mattress or blocks under the bed frame to raise the head of the bed. What foods are not recommended? The items listed may not be a complete list. Talk with your dietitian about what dietary choices are best for you. Grains Pastries or quick breads with added fat. [place] toast. Vegetables Deep fried vegetables. [place] fries. Any vegetables prepared with added fat. Any vegetables that cause symptoms. For some people this may include tomatoes and tomato products, chili peppers, onions and garlic, and horseradish. Fruits Any fruits prepared with added fat. Any fruits that cause symptoms. For some people this may include citrus fruits, such as oranges, grapefruit, pineapple, and lemons. Meats and other protein foods High-fat meats, such as fatty beef or pork, hot dogs, ribs, ham, sausage, salami and bacon. Fried meat or protein, including fried fish and fried chicken. Nuts and nut butters. Dairy Whole milk and chocolate milk. Sour cream. Cream. Ice cream. Cream cheese. Milk shakes. Beverages Coffee and tea, with or without caffeine. Carbonated beverages. Sodas. Energy drinks. Fruit juice made with acidic fruits (such as orange or grapefruit). Tomato juice. Alcoholic drinks. Fats and oils Butter. Margarine. Shortening. Ghee. Sweets and desserts Chocolate and cocoa. Donuts. Seasoning and other foods Pepper. Peppermint and spearmint. Any condiments, herbs, or seasonings that cause symptoms. For some people, this may include curry, hot sauce, or vinegar-based salad  dressings. Summary  When you have gastroesophageal reflux disease (GERD), food and lifestyle choices are very important to help ease the discomfort of GERD.  Eat frequent, small meals instead of three large meals each day. Eat your meals slowly, in a relaxed setting. Avoid bending  over or lying down until 2-3 hours after eating.  Limit high-fat foods such as fatty meat or fried foods. This information is not intended to replace advice given to you by your health care provider. Make sure you discuss any questions you have with your health care provider. Document Released: 11/01/2005 Document Revised: 11/02/2016 Document Reviewed: 11/02/2016 Elsevier Interactive Patient Education  2017 [employer].   IF you received an x-ray today, you will receive an invoice from [facility]. Please contact [facility] at [phone] with questions or concerns regarding your invoice.   IF you received labwork today, you will receive an invoice from [place]. Please contact [facility] at [phone] with questions or concerns regarding your invoice.   Our billing staff will not be able to assist you with questions regarding bills from these companies.  You will be contacted with the lab results as soon as they are available. The fastest way to get your results is to activate your My Chart account. Instructions are located on the last page of this paperwork. If you have not heard from [place] regarding the results in 2 weeks, please contact this office.

## 2017-07-08 ENCOUNTER — Ambulatory Visit (INDEPENDENT_AMBULATORY_CARE_PROVIDER_SITE_OTHER): Payer: Medicare HMO | Admitting: Internal Medicine

## 2017-07-08 ENCOUNTER — Encounter: Payer: Self-pay | Admitting: Internal Medicine

## 2017-07-08 ENCOUNTER — Other Ambulatory Visit (INDEPENDENT_AMBULATORY_CARE_PROVIDER_SITE_OTHER): Payer: Medicare HMO

## 2017-07-08 VITALS — BP 128/78 | HR 77 | Ht 71.5 in | Wt 223.8 lb

## 2017-07-08 DIAGNOSIS — J449 Chronic obstructive pulmonary disease, unspecified: Secondary | ICD-10-CM | POA: Diagnosis not present

## 2017-07-08 DIAGNOSIS — R058 Other specified cough: Secondary | ICD-10-CM

## 2017-07-08 DIAGNOSIS — I10 Essential (primary) hypertension: Secondary | ICD-10-CM

## 2017-07-08 DIAGNOSIS — R05 Cough: Secondary | ICD-10-CM

## 2017-07-08 DIAGNOSIS — R0609 Other forms of dyspnea: Secondary | ICD-10-CM | POA: Diagnosis not present

## 2017-07-08 LAB — CBC WITH DIFFERENTIAL/PLATELET
Basophils Absolute: 0 10*3/uL (ref 0.0–0.1)
Basophils Relative: 0.4 % (ref 0.0–3.0)
Eosinophils Absolute: 0.1 10*3/uL (ref 0.0–0.7)
Eosinophils Relative: 1.3 % (ref 0.0–5.0)
HCT: 43.3 % (ref 39.0–52.0)
Hemoglobin: 14.4 g/dL (ref 13.0–17.0)
Lymphocytes Relative: 15.2 % (ref 12.0–46.0)
Lymphs Abs: 1.4 10*3/uL (ref 0.7–4.0)
MCHC: 33.2 g/dL (ref 30.0–36.0)
MCV: 97 fl (ref 78.0–100.0)
Monocytes Absolute: 0.9 10*3/uL (ref 0.1–1.0)
Monocytes Relative: 9.6 % (ref 3.0–12.0)
Neutro Abs: 6.7 10*3/uL (ref 1.4–7.7)
Neutrophils Relative %: 73.5 % (ref 43.0–77.0)
Platelets: 263 10*3/uL (ref 150.0–400.0)
RBC: 4.46 Mil/uL (ref 4.22–5.81)
RDW: 13.8 % (ref 11.5–15.5)
WBC: 9.1 10*3/uL (ref 4.0–10.5)

## 2017-07-08 MED ORDER — PREDNISONE 10 MG PO TABS: ORAL_TABLET | ORAL | 0 refills | Status: DC

## 2017-07-08 MED ORDER — TIOTROPIUM BROMIDE-OLODATEROL 2.5-2.5 MCG/ACT IN AERS: 2.0000 | INHALATION_SPRAY | Freq: Every day | RESPIRATORY_TRACT | 0 refills | Status: DC

## 2017-07-08 NOTE — Progress Notes (Signed)
Subjective:     Patient ID: Brandon PLEMMONS, male   DOB: 29-Jun-1941,    MRN: [id number]    Brief patient profile:  48 yowm quit smoking 2013 at dx of AAA resection and breathing able to yard work with indolent onset of doe around Dec 2017 s cough so referred to pulmonary clinic 01/04/2017 by Dr  Moreen Fowler    History of Present Illness  01/04/2017 1st [place] [facility] office visit/ Eyoel Throgmorton   Chief Complaint  Patient presents with  . Pulmonary Consult    Referred by Dr. Antony Contras for eval of COPD.  Pt c/o SOB for the past 2-3 months. He states he gets SOB approx 60 ft- better since starting of Stiolto.    indolent onset of doe x 10/2016 to point where only walking 60 ft much worse on anoro with upper airway symptoms worse on it and better on stiolto but no where near his baseline activity tolerance  No problem at rest or noct and no cough and denies significant weight gain since quit smoking though records say wt 180 11/2012 rec Keep taking your stiolto every morning until you finish it up then try off it as you do not have significant copd - if worse breathing off it, restart it.  I believe your problem is likely related to your blood pressure pill and the only way to know is try off x 6 weeks Stop lisinopril and start ibesartan 150 mg daily and your breathing should gradually return to normal    07/08/2017  f/u ov/Mac Dowdell re: chronic cough ? If really has copd as f/v loop not typical   Chief Complaint  Patient presents with  . Follow-up    Pt still has a lingering cough that he is unable to get rid of. Since pt's last visit, pt states he was diagnosed with pna by Dr. Mingo Amber 04/27/17. Pt was treated for pna and had a repeated cxr 06/02/17 and pna seemed to be gone but was told to follow-up with pulmonary. Pt's cough is worse at night with occ prod. cough w/ white phlem. States that he is unsure if he has been SOB due to body hurting all over from coughing.  did not stay on stiolto changed to bevespi  though very confused with names of meds / techniques for inhalers  Cough was better until pna 04/27/17  then recurred  esp hs and disturbing sleep but s excess or purulent mucus  Really  Not limited by breathing from desired activities  But very sedentary at this point   No obvious day to day or daytime variability or assoc   mucus plugs or hemoptysis or cp or chest tightness, subjective wheeze or overt sinus or hb symptoms. No unusual exp hx or h/o childhood pna/ asthma or knowledge of premature birth.  Sleeping ok without nocturnal  or early am exacerbation  of respiratory  c/o's or need for noct saba. Also denies any obvious fluctuation of symptoms with weather or environmental changes or other aggravating or alleviating factors except as outlined above   Current Medications, Allergies, Complete Past Medical History, Past Surgical History, Family History, and Social History were reviewed in [employer] record.  ROS  The following are not active complaints unless bolded sore throat, dysphagia, dental problems, itching, sneezing,  nasal congestion or excess/ purulent secretions, ear ache,   fever, chills, sweats, unintended wt loss, classically pleuritic or exertional cp,  orthopnea pnd or leg swelling, presyncope, palpitations, abdominal pain, anorexia,  nausea, vomiting, diarrhea  or change in bowel or bladder habits, change in stools or urine, dysuria,hematuria,  rash, arthralgias, visual complaints, headache, numbness, weakness or ataxia or problems with walking or coordination,  change in mood/affect or memory.              Objective:   Physical Exam    amb hoarse wm nad      07/08/2017      223   01/04/17 225 lb 9.6 oz (102.3 kg)  05/01/16 213 lb (96.6 kg)  04/08/16 218 lb (98.9 kg)    Vital signs reviewed - Note on arrival 02 sats  96% on RA    HEENT: nl  turbinates bilaterally, and oropharynx. Nl external ear canals without cough reflex   NECK :  without  JVD/Nodes/TM/ nl carotid upstrokes bilaterally   LUNGS: no acc muscle use,  Nl contour chest which is clear to A and P bilaterally without cough on insp or exp maneuvers   CV:  RRR  no s3 or murmur or increase in P2, and no edema   ABD:  Tensely obese  nontender with nl inspiratory excursion in the supine position. No bruits or organomegaly appreciated, bowel sounds nl  MS:  Nl gait/ ext warm without deformities, calf tenderness, cyanosis or clubbing No obvious joint restrictions   SKIN: warm and dry without lesions    NEURO:  alert, approp, nl sensorium with  no motor or cerebellar deficits apparent.        I personally reviewed images and agree with radiology impression as follows:  CXR:    06/02/17 Interval resolution of left parahilar airspace disease.    Labs ordered 07/08/2017    Allergy profile   Assessment:

## 2017-07-08 NOTE — Patient Instructions (Addendum)
Stop bevespi and try stiolto 2 pffs each am x 2 weeks then stop   Prednisone 10 mg take  4 each am x 2 days,   2 each am x 2 days,  1 each am x 2 days and stop   Prilosec 20 mg x 2 take 30 min before supper until return   GERD (REFLUX)  is an extremely common cause of respiratory symptoms just like yours , many times with no obvious heartburn at all.    It can be treated with medication, but also with lifestyle changes including elevation of the head of your bed (ideally with 6 inch  bed blocks),  Smoking cessation, avoidance of late meals, excessive alcohol, and avoid fatty foods, chocolate, peppermint, colas, red wine, and acidic juices such as orange juice.  NO MINT OR MENTHOL PRODUCTS SO NO COUGH DROPS   USE SUGARLESS CANDY INSTEAD (Jolley ranchers or Stover's or Life Savers) or even ice chips will also do - the key is to swallow to prevent all throat clearing. NO OIL BASED VITAMINS - use powdered substitutes.    For drainage / throat tickle try take CHLORPHENIRAMINE  4 mg - take one every 4 hours as needed - available over the counter- may cause drowsiness so start with just a one hour before bedtime dose or two and see how you tolerate it before trying in daytime    Please remember to go to the lab department downstairs in the basement  for your tests - we will call you with the results when they are available.     Please schedule a follow up office visit in 6 weeks, call sooner if needed with all medications /inhalers/ solutions in hand so we can verify exactly what you are taking. This includes all medications from all doctors and over the counters

## 2017-07-08 NOTE — Assessment & Plan Note (Addendum)
01/04/2017  Walked RA x 3 laps @ 185 ft each stopped due to  End of study, nl pace, no sob or desat    - Spirometry 01/04/2017  FEV1 2.51 (75%)  Ratio 67 but effort portion of f/v is moderately truncated though not true plateau  01/04/2017    continue stiolto until 6 weeks off acei then try off stiolto   - 07/08/2017  After extensive coaching HFA effectiveness =    90% with stiolto and coughing on bevespi so rec change back to stiolto 2 each am x 2 weeks while on max rx for gerd then try off all inhalers and return for repeat spirometry off all inhalers if tolerated     I had an extended discussion with the patient reviewing all relevant studies completed to date and  lasting 15 to 20 minutes of a 25 minute visit    Each maintenance medication was reviewed in detail including most importantly the difference between maintenance and prns and under what circumstances the prns are to be triggered using an action plan format that is not reflected in the computer generated alphabetically organized AVS.    Please see AVS for specific instructions unique to this visit that I personally wrote and verbalized to the the pt in detail and then reviewed with pt  by my nurse highlighting any  changes in therapy recommended at today's visit to their plan of care.    Return with all meds in hand using a trust but verify approach to confirm accurate Medication  Reconciliation The principal here is that until we are certain that the  patients are doing what we've asked, it makes no sense to ask them to do more.

## 2017-07-09 ENCOUNTER — Encounter: Payer: Self-pay | Admitting: Internal Medicine

## 2017-07-09 NOTE — Assessment & Plan Note (Signed)
Allergy profile 07/08/2017 >  Eos 0.1 /  IgE   - GERD rx 1st gen h1 and try off bevespi   07/08/2017 >>>   Upper airway cough syndrome (previously labeled PNDS) , is  so named because it's frequently impossible to sort out how much is  CR/sinusitis with freq throat clearing (which can be related to primary GERD)   vs  causing  secondary (" extra esophageal")  GERD from wide swings in gastric pressure that occur with throat clearing, often  promoting self use of mint and menthol lozenges that reduce the lower esophageal sphincter tone and exacerbate the problem further in a cyclical fashion.   These are the same pts (now being labeled as having "irritable larynx syndrome" by some cough centers) who not infrequently have a history of having failed to tolerate ace inhibitors,  dry powder inhalers (and sometimes other inhalers including bevespi)  or biphosphonates or report having atypical/extraesophageal reflux symptoms that don't respond to standard doses of PPI  and are easily confused as having aecopd or asthma flares by even experienced allergists/ pulmonologists (myself included).    Will try off bevespi, max rx for gerd and 1st gen h1 if tol then regroup here in 6 weeks

## 2017-07-09 NOTE — Assessment & Plan Note (Signed)
Spirometry 01/04/2017   FEV1 2.51 (75%)  Ratio 67 but  Not c/w copd by f/v loop > trial off acei rec and f/u pulmonary prn   07/08/2017  After extensive coaching device  effectiveness =    90% with [facility]    When respiratory symptoms begin or become refractory well after a patient reports complete smoking cessation,  Especially when this wasn't the case while they were smoking, a red flag is raised based on the work of Dr Kris Mouton which states:  if you quit smoking when your best day FEV1 is still well preserved it is highly unlikely you will progress to severe disease.  That is to say, once the smoking stops,  the symptoms should not suddenly erupt or markedly worsen.  If so, the differential diagnosis should include  obesity/deconditioning,  LPR/Reflux/Aspiration syndromes,  occult CHF, or  especially side effect of medications commonly used in this population like ACEi and sometimes symptoms are caused by copd meds intended to help (esp inhalers which can trigger cough)   Since stiolto is the least likely to make him cough rec take it x 2 weeks and then stop it and see what difference if any it makes then return for new set up pft/s regroup

## 2017-07-09 NOTE — Assessment & Plan Note (Addendum)
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   Adequate control on present rx, reviewed in detail with pt > no change in rx needed

## 2017-07-11 LAB — RESPIRATORY ALLERGY PROFILE REGION II ~~LOC~~
Allergen, A. alternata, m6: <0.1 kU/L
Allergen, C. Herbarum, M2: <0.1 kU/L
Allergen, Cedar tree, t12: <0.1 kU/L
Allergen, Comm Silver Birch, t9: <0.1 kU/L
Allergen, Cottonwood, t14: <0.1 kU/L
Allergen, D pternoyssinus,d7: <0.1 kU/L
Allergen, Mouse Urine Protein, e78: <0.1 kU/L
Allergen, Mulberry, t76: <0.1 kU/L
Allergen, Oak,t7: <0.1 kU/L
Allergen, P. notatum, m1: <0.1 kU/L
Aspergillus fumigatus, m3: <0.1 kU/L
Bermuda Grass: <0.1 kU/L
Box Elder IgE: <0.1 kU/L
Cat Dander: <0.1 kU/L
Cockroach: <0.1 kU/L
Common Ragweed: <0.1 kU/L
D. farinae: <0.1 kU/L
Dog Dander: <0.1 kU/L
Elm IgE: <0.1 kU/L
IgE (Immunoglobulin E), Serum: 10 kU/L (ref <115)
Johnson Grass: <0.1 kU/L
Pecan/Hickory Tree IgE: <0.1 kU/L
Rough Pigweed  IgE: <0.1 kU/L
Sheep Sorrel IgE: <0.1 kU/L
Timothy Grass: <0.1 kU/L

## 2017-07-11 NOTE — Progress Notes (Signed)
Spoke with pt and notified of results per Dr. Wert. Pt verbalized understanding and denied any questions. 

## 2017-08-19 ENCOUNTER — Ambulatory Visit (INDEPENDENT_AMBULATORY_CARE_PROVIDER_SITE_OTHER): Payer: Medicare HMO | Admitting: Internal Medicine

## 2017-08-19 ENCOUNTER — Encounter: Payer: Self-pay | Admitting: Internal Medicine

## 2017-08-19 VITALS — BP 122/80 | HR 72 | Ht 71.5 in | Wt 225.0 lb

## 2017-08-19 DIAGNOSIS — R058 Other specified cough: Secondary | ICD-10-CM

## 2017-08-19 DIAGNOSIS — R05 Cough: Secondary | ICD-10-CM

## 2017-08-19 NOTE — Progress Notes (Signed)
Subjective:     Patient ID: Brandon [name], male   DOB: 28-Sep-1941,    MRN: [id number]    Brief patient profile:  65 yowm quit smoking 2013 at dx of AAA resection and breathing =  able to yard work with indolent onset of doe around Dec 2017 s cough so referred to pulmonary clinic 01/04/2017 by Dr  Moreen Fowler with ? Copd but spirometry did not show any obstruction.     History of Present Illness  01/04/2017 1st [facility] office visit/ Kordelia Severin   Chief Complaint  Patient presents with  . Pulmonary Consult    Referred by Dr. Antony Contras for eval of COPD.  Pt c/o SOB for the past 2-3 months. He states he gets SOB approx 60 ft- better since starting of Stiolto.    indolent onset of doe x 10/2016 to point where only walking 60 ft much worse on anoro with upper airway symptoms worse on it and better on stiolto but no where near his baseline activity tolerance  No problem at rest or noct and no cough and denies significant weight gain since quit smoking though records say wt 180 11/2012 rec Keep taking your stiolto every morning until you finish it up then try off it as you do not have significant copd - if worse breathing off it, restart it.  I believe your problem is likely related to your blood pressure pill and the only way to know is try off x 6 weeks Stop lisinopril and start ibesartan 150 mg daily and your breathing should gradually return to normal    07/08/2017  f/u ov/Jun Osment re: chronic cough ? If really has copd as f/v loop not typical   Chief Complaint  Patient presents with  . Follow-up    Pt still has a lingering cough that he is unable to get rid of. Since pt's last visit, pt states he was diagnosed with pna by Dr. Mingo Amber 04/27/17. Pt was treated for pna and had a repeated cxr 06/02/17 and pna seemed to be gone but was told to follow-up with pulmonary. Pt's cough is worse at night with occ prod. cough w/ white phlem. States that he is unsure if he has been SOB due to body hurting all over  from coughing.  did not stay on stiolto changed to bevespi though very confused with names of meds / techniques for inhalers  Cough was better until pna 04/27/17  then recurred  esp hs and disturbing sleep but s excess or purulent mucus  Really  Not limited by breathing from desired activities  But very sedentary at this point  rec Stop bevespi and try stiolto 2 pffs each am x 2 weeks then stop  Prednisone 10 mg take  4 each am x 2 days,   2 each am x 2 days,  1 each am x 2 days and stop  Prilosec 20 mg x 2 take 30 min before supper until return  GERD diet  For drainage / throat tickle try take CHLORPHENIRAMINE  4 mg    08/19/2017  f/u ov/Cynthia Stainback re:  Cough resolved on prilosec / no  Longer on any inhalers at all x one month Chief Complaint  Patient presents with  . Follow-up    Breathing is much improved. No new co's.    never needed chlorpheniramine, no cough and Not limited by breathing from desired activities   Has cut down ppi to prilosec 20 mg daily s flare in noct or daytime  cough.    No obvious day to day or daytime variability or assoc excess/ purulent sputum or mucus plugs or hemoptysis or cp or chest tightness, subjective wheeze or overt sinus or hb symptoms. No unusual exp hx or h/o childhood pna/ asthma or knowledge of premature birth.  Sleeping ok flat without nocturnal  or early am exacerbation  of respiratory  c/o's or need for noct saba. Also denies any obvious fluctuation of symptoms with weather or environmental changes or other aggravating or alleviating factors except as outlined above.  Current Allergies, Complete Past Medical History, Past Surgical History, Family History, and Social History were reviewed in [employer] record.  ROS  The following are not active complaints unless bolded Hoarseness, sore throat, dysphagia, dental problems, itching, sneezing,  nasal congestion or discharge of excess mucus or purulent secretions, ear ache,   fever,  chills, sweats, unintended wt loss or wt gain, classically pleuritic or exertional cp,  orthopnea pnd or leg swelling, presyncope, palpitations, abdominal pain, anorexia, nausea, vomiting, diarrhea  or change in bowel habits or change in bladder habits, change in stools or change in urine, dysuria, hematuria,  rash, arthralgias, visual complaints, headache, numbness, weakness or ataxia or problems with walking or coordination,  change in mood/affect or memory.        Current Meds  Medication Sig  . atorvastatin (LIPITOR) 10 MG tablet   . escitalopram (LEXAPRO) 10 MG tablet Take 1 tablet by mouth daily.  Marland Kitchen ibuprofen (ADVIL,MOTRIN) 200 MG tablet Take 200 mg by mouth every 6 (six) hours as needed.  . irbesartan (AVAPRO) 150 MG tablet Take 1 tablet (150 mg total) by mouth daily.  Marland Kitchen omeprazole (PRILOSEC) 20 MG capsule Take 1 capsule (20 mg total) by mouth daily.             Objective:   Physical Exam    Amb pleasant wm nad   - all smiles   08/19/2017       225   07/08/2017      223   01/04/17 225 lb 9.6 oz (102.3 kg)  05/01/16 213 lb (96.6 kg)  04/08/16 218 lb (98.9 kg)    Vital signs reviewed - Note on arrival 02 sats  96% on RA    HEENT: nl  turbinates bilaterally, and oropharynx. Nl external ear canals without cough reflex   NECK :  without JVD/Nodes/TM/ nl carotid upstrokes bilaterally   LUNGS: no acc muscle use,  Nl contour chest which is clear to A and P bilaterally without cough on insp or exp maneuvers   CV:  RRR  no s3 or murmur or increase in P2, and no edema   ABD:  Tensely obese  nontender with nl inspiratory excursion in the supine position. No bruits or organomegaly appreciated, bowel sounds nl  MS:  Nl gait/ ext warm without deformities, calf tenderness, cyanosis or clubbing No obvious joint restrictions   SKIN: warm and dry without lesions    NEURO:  alert, approp, nl sensorium with  no motor or cerebellar deficits apparent.              Assessment:

## 2017-08-19 NOTE — Patient Instructions (Signed)
Continue to take prilosec 20 mg  X 30 min before supper but if you flare don't hesitate to increase to a max dose of 20 mg x 2 Take 30- 60 min before your first and last meals of the day   Keep working on the diet   If you are satisfied with your treatment plan,  let your doctor know and he/she can either refill your medications or you can return here when your prescription runs out.     If in any way you are not 100% satisfied,  please tell us.  If 100% better, tell your friends!  Pulmonary follow up is as needed

## 2017-08-19 NOTE — Assessment & Plan Note (Signed)
Allergy profile 07/08/2017 >  Eos 0.1 /  IgE  10 neg RAST - GERD rx 1st gen h1 and try off bevespi   07/08/2017 >> all symptoms resolved 08/19/2017 > pulmonary f/u prn   Excellent control of symptoms on min meds/ diet > no need for pulmonary f/u  Reviewed rx for flare = max gerd short term/ long term rx per PCP  Each maintenance medication was reviewed in detail including most importantly the difference between maintenance and as needed and under what circumstances the prns are to be used.  Please see AVS for specific  Instructions which are unique to this visit and I personally typed out  which were reviewed in detail in writing with the patient and a copy provided.

## 2017-09-08 ENCOUNTER — Encounter: Payer: Self-pay | Admitting: Family Medicine

## 2017-09-08 ENCOUNTER — Ambulatory Visit (INDEPENDENT_AMBULATORY_CARE_PROVIDER_SITE_OTHER): Payer: Medicare HMO | Admitting: Family Medicine

## 2017-09-08 VITALS — BP 138/84 | HR 69 | Temp 97.5°F | Resp 18 | Ht 71.5 in | Wt 233.0 lb

## 2017-09-08 DIAGNOSIS — L03311 Cellulitis of abdominal wall: Secondary | ICD-10-CM

## 2017-09-08 DIAGNOSIS — L02419 Cutaneous abscess of limb, unspecified: Secondary | ICD-10-CM

## 2017-09-08 DIAGNOSIS — S20411A Abrasion of right back wall of thorax, initial encounter: Secondary | ICD-10-CM | POA: Diagnosis not present

## 2017-09-08 DIAGNOSIS — L03113 Cellulitis of right upper limb: Secondary | ICD-10-CM | POA: Diagnosis not present

## 2017-09-08 MED ORDER — DOXYCYCLINE HYCLATE 100 MG PO TABS: 100.0000 mg | ORAL_TABLET | Freq: Two times a day (BID) | ORAL | 0 refills | Status: DC

## 2017-09-08 NOTE — Patient Instructions (Addendum)
Apply warm compresses or warm water soaks 4-5 times per day to both elbow as well as abdominal wall areas. Gentle pressure after warm compresses, clean with soap and water at least twice per day, and apply bandage as needed. Polysporin or Neosporin are okay, but no further alcohol to affected areas Start antibiotic twice per day and recheck with Jaynee Eagles, PA-C on Saturday as planned. Return to the clinic or go to the nearest emergency room if any of your symptoms worsen or new symptoms occur.  The area on your upper back appears to be an abrasion. Allow that to heal, but if any residual lesion or swelling in the next few weeks, let me take another look at it to make sure he does not need to be biopsied.  Congrats on 5 years since the aorta surgery!  Skin Abscess A skin abscess is an infected area on or under your skin that contains a collection of pus and other material. An abscess may also be called a furuncle, carbuncle, or boil. An abscess can occur in or on almost any part of your body. Some abscesses break open (rupture) on their own. Most continue to get worse unless they are treated. The infection can spread deeper into the body and eventually into your blood, which can make you feel ill. Treatment usually involves draining the abscess. What are the causes? An abscess occurs when germs, often bacteria, pass through your skin and cause an infection. This may be caused by:  A scrape or cut on your skin.  A puncture wound through your skin, including a needle injection.  Blocked oil or sweat glands.  Blocked and infected hair follicles.  A cyst that forms beneath your skin (sebaceous cyst) and becomes infected.  What increases the risk? This condition is more likely to develop in people who:  Have a weak body defense system (immune system).  Have diabetes.  Have dry and irritated skin.  Get frequent injections or use illegal IV drugs.  Have a foreign body in a wound, such as a  splinter.  Have problems with their lymph system or veins.  What are the signs or symptoms? An abscess may start as a painful, firm bump under the skin. Over time, the abscess may get larger or become softer. Pus may appear at the top of the abscess, causing pressure and pain. It may eventually break through the skin and drain. Other symptoms include:  Redness.  Warmth.  Swelling.  Tenderness.  A sore on the skin.  How is this diagnosed? This condition is diagnosed based on your medical history and a physical exam. A sample of pus may be taken from the abscess to find out what is causing the infection and what antibiotics can be used to treat it. You also may have:  Blood tests to look for signs of infection or spread of an infection to your blood.  Imaging studies such as ultrasound, CT scan, or MRI if the abscess is deep.  How is this treated? Small abscesses that drain on their own may not need treatment. Treatment for an abscess that does not rupture on its own may include:  Warm compresses applied to the area several times per day.  Incision and drainage. Your health care provider will make an incision to open the abscess and will remove pus and any foreign body or dead tissue. The incision area may be packed with gauze to keep it open for a few days while it heals.  Antibiotic medicines to treat infection. For a severe abscess, you may first get antibiotics through an IV and then change to oral antibiotics.  Follow these instructions at home: Abscess Care  If you have an abscess that has not drained, place a warm, clean, wet washcloth over the abscess several times a day. Do this as told by your health care provider.  Follow instructions from your health care provider about how to take care of your abscess. Make sure you: ? Cover the abscess with a bandage (dressing). ? Change your dressing or gauze as told by your health care provider. ? Wash your hands with soap and  water before you change the dressing or gauze. If soap and water are not available, use hand sanitizer.  Check your abscess every day for signs of a worsening infection. Check for: ? More redness, swelling, or pain. ? More fluid or blood. ? Warmth. ? More pus or a bad smell. Medicines  Take over-the-counter and prescription medicines only as told by your health care provider.  If you were prescribed an antibiotic medicine, take it as told by your health care provider. Do not stop taking the antibiotic even if you start to feel better. General instructions  To avoid spreading the infection: ? Do not share personal care items, towels, or hot tubs with others. ? Avoid making skin contact with other people.  Keep all follow-up visits as told by your health care provider. This is important. Contact a health care provider if:  You have more redness, swelling, or pain around your abscess.  You have more fluid or blood coming from your abscess.  Your abscess feels warm to the touch.  You have more pus or a bad smell coming from your abscess.  You have a fever.  You have muscle aches.  You have chills or a general ill feeling. Get help right away if:  You have severe pain.  You see red streaks on your skin spreading away from the abscess. This information is not intended to replace advice given to you by your health care provider. Make sure you discuss any questions you have with your health care provider. Document Released: 08/11/2005 Document Revised: 06/27/2016 Document Reviewed: 09/10/2015 Elsevier Interactive Patient Education  2018 [employer].     IF you received an x-ray today, you will receive an invoice from [facility]. Please contact [facility] at [phone] with questions or concerns regarding your invoice.   IF you received labwork today, you will receive an invoice from [facility]. Please contact [facility] at [phone] with questions or  concerns regarding your invoice.   Our billing staff will not be able to assist you with questions regarding bills from these companies.  You will be contacted with the lab results as soon as they are available. The fastest way to get your results is to activate your My Chart account. Instructions are located on the last page of this paperwork. If you have not heard from [place] regarding the results in 2 weeks, please contact this office.

## 2017-09-08 NOTE — Progress Notes (Signed)
By signing my name below, I, Rae Lips, attest that this documentation has been prepared under the direction and in the presence of Dr. [name], MD. Electronically Signed: Rae Lips, Medical Scribe 09/08/2017 at 1:42 PM.  Subjective:    Patient ID: Brandon [name], male    DOB: 01-08-1941, 76 y.o.   MRN: [id number]  Chief Complaint  Patient presents with  . Abcess    x2 weeks, pt states he has abcess on his right elbow and on his stomach.   HPI Brandon [name] is a 76 y.o. male who presents to Primary Care at [facility] complaining of an abscess on his right elbow and on his abdomen. He has a history of tobacco use, COPD, PVD, HTN, and upper airway cough syndrome that has been managed by Dr. Melvyn Novas. He reports the abscess on his right elbow started two weeks ago as a little pimple that has progressively gotten larger. He has squeezed the pus out, cleaned it with alcohol, and applied Neosporin to it everyday. He also reports two lesions on his abdomen, with drainage from the larger one for the past week.  He also reports an area of scratching on his upper back for about 1-2 weeks.  Patient Active Problem List   Diagnosis Date Noted  . Upper airway cough syndrome 07/08/2017  . Dyspnea on exertion 01/04/2017  . Obesity (BMI 30-39.9) 01/04/2017  . Essential hypertension 12/05/2015  . Peripheral vascular disease, unspecified ([place]) 03/12/2014  . Atherosclerosis of native arteries of the extremities with intermittent claudication 03/12/2014  . Physical deconditioning 09/19/2012  . Chronic obstructive pulmonary disease with bronchospasm ([place]) 09/11/2012  . AAA (abdominal aortic aneurysm, ruptured) ([place]) 09/09/2012  . Hyperglycemia 09/09/2012  . Former tobacco use 09/08/2012   Past Medical History:  Diagnosis Date  . AAA (abdominal aortic aneurysm) ([place])   . COPD (chronic obstructive pulmonary disease) ([place])   . DIC (disseminated intravascular coagulation) ([place]) 09/09/2012  .  Leg wound, left 12/12/2013  . Peripheral vascular disease ([place])   . Respiratory failure, post-operative ([place]) 09/09/2012  . Shortness of breath    Past Surgical History:  Procedure Laterality Date  . ABDOMINAL AORTIC ANEURYSM REPAIR  09/08/2012   Procedure: ANEURYSM ABDOMINAL AORTIC REPAIR;  Surgeon: [name], MD;  Location: [place];  Service: Vascular;  Laterality: N/A;  open repair ruptured abdomenal aortic aneurysm  . GANGLION CYST EXCISION     Right wrist   No Known Allergies Prior to Admission medications   Medication Sig Start Date End Date Taking? Authorizing Provider  atorvastatin (LIPITOR) 10 MG tablet  04/10/17   [provider]  escitalopram (LEXAPRO) 10 MG tablet Take 1 tablet by mouth daily. 12/25/16   [provider]  ibuprofen (ADVIL,MOTRIN) 200 MG tablet Take 200 mg by mouth every 6 (six) hours as needed.    [provider]  irbesartan (AVAPRO) 150 MG tablet Take 1 tablet (150 mg total) by mouth daily. 01/04/17   Tanda Rockers, MD  omeprazole (PRILOSEC) 20 MG capsule Take 1 capsule (20 mg total) by mouth daily. 06/02/17   [name], MD   Social History   Social History  . Marital status: Married    Spouse name: N/A  . Number of children: N/A  . Years of education: N/A   Occupational History  . Not on file.   Social History Main Topics  . Smoking status: Former Smoker    Packs/day: 1.00    Years: 60.00  Quit date: 09/08/2012  . Smokeless tobacco: Never Used  . Alcohol use No  . Drug use: No  . Sexual activity: Not Currently    Birth control/ protection: None   Other Topics Concern  . Not on file   Social History Narrative  . No narrative on file     Review of Systems  Skin:       Abscess on right elbow. Two lesions on abdomen. Area of scratching on upper back.        Objective:   Physical Exam  Constitutional: He is oriented to person, place, and time. He appears well-developed and well-nourished.  HENT:    Head: Normocephalic and atraumatic.  Cardiovascular: Normal rate.   Pulmonary/Chest: Effort normal.  Neurological: He is alert and oriented to person, place, and time.  Skin:  On his right elbow he has an  3 cm x 4 cm erythematous patch with central abscess about 1.2 cm across by 1 cm. There is a small amount of blood with white pus expressed. Appears to be superficial without deep fluctuance.  On the abdomen, lower abdominal wall, midline to the right, there are 2 small areas, both approximately 5 mm across. There's erythema approximately 6 cm by 4 mm surrounding those two lesions. There's no significant induration on the middle aspect, but he does have 3 cm x 2 cm of induration on lateral aspect without central fluctuates. Small abrasion on his right upper back with some dry skin. No surrounding erythema or discharge.   Psychiatric: He has a normal mood and affect.   Vitals:   09/08/17 1337  BP: 138/84  Pulse: 69  Resp: 18  Temp: (!) 97.5 F (36.4 C)  TempSrc: Oral  SpO2: 95%  Weight: 233 lb (105.7 kg)  Height: 5' 11.5" (1.816 m)        Assessment & Plan:    Brandon [name] is a 76 y.o. male Cellulitis of right elbow - Plan: doxycycline (VIBRA-TABS) 100 MG tablet, WOUND CULTURE Cellulitis, abdominal wall - Plan: doxycycline (VIBRA-TABS) 100 MG tablet, WOUND CULTURE Abscess, elbow - Plan: doxycycline (VIBRA-TABS) 100 MG tablet, WOUND CULTURE  - Initial elbow cellulitis/abscess, subjectively is improving. There are still some signs of exudate at that area, but appears to be fairly superficial and easily expressed. Do not think incision/drainage of elbows seen at this time. did have pain-free range of motion of elbow, so appears to be limited to skin.  -More recently with lower abdominal wall cellulitis, early abscess. No fluctuance, again deferred incision and drainage at this time.  -Doxycycline 100 mg twice a day, wound culture, warm compresses frequently throughout the day to  both elbow and abdominal wall versus warm water soaks that elbow, and recheck in 48 hours.  Abrasion of right side of back, initial encounter  -Appears to be healing abrasion, recheck if the area does not continue to heal to make sure biopsy not indicated.   Meds ordered this encounter  Medications  . doxycycline (VIBRA-TABS) 100 MG tablet    Sig: Take 1 tablet (100 mg total) by mouth 2 (two) times daily.    Dispense:  20 tablet    Refill:  0   Patient Instructions   Apply warm compresses or warm water soaks 4-5 times per day to both elbow as well as abdominal wall areas. Gentle pressure after warm compresses, clean with soap and water at least twice per day, and apply bandage as needed. Polysporin or Neosporin are okay, but no further  alcohol to affected areas Start antibiotic twice per day and recheck with Jaynee Eagles, PA-C on Saturday as planned. Return to the clinic or go to the nearest emergency room if any of your symptoms worsen or new symptoms occur.  The area on your upper back appears to be an abrasion. Allow that to heal, but if any residual lesion or swelling in the next few weeks, let me take another look at it to make sure he does not need to be biopsied.  Congrats on 5 years since the aorta surgery!  Skin Abscess A skin abscess is an infected area on or under your skin that contains a collection of pus and other material. An abscess may also be called a furuncle, carbuncle, or boil. An abscess can occur in or on almost any part of your body. Some abscesses break open (rupture) on their own. Most continue to get worse unless they are treated. The infection can spread deeper into the body and eventually into your blood, which can make you feel ill. Treatment usually involves draining the abscess. What are the causes? An abscess occurs when germs, often bacteria, pass through your skin and cause an infection. This may be caused by:  A scrape or cut on your skin.  A puncture  wound through your skin, including a needle injection.  Blocked oil or sweat glands.  Blocked and infected hair follicles.  A cyst that forms beneath your skin (sebaceous cyst) and becomes infected.  What increases the risk? This condition is more likely to develop in people who:  Have a weak body defense system (immune system).  Have diabetes.  Have dry and irritated skin.  Get frequent injections or use illegal IV drugs.  Have a foreign body in a wound, such as a splinter.  Have problems with their lymph system or veins.  What are the signs or symptoms? An abscess may start as a painful, firm bump under the skin. Over time, the abscess may get larger or become softer. Pus may appear at the top of the abscess, causing pressure and pain. It may eventually break through the skin and drain. Other symptoms include:  Redness.  Warmth.  Swelling.  Tenderness.  A sore on the skin.  How is this diagnosed? This condition is diagnosed based on your medical history and a physical exam. A sample of pus may be taken from the abscess to find out what is causing the infection and what antibiotics can be used to treat it. You also may have:  Blood tests to look for signs of infection or spread of an infection to your blood.  Imaging studies such as ultrasound, CT scan, or MRI if the abscess is deep.  How is this treated? Small abscesses that drain on their own may not need treatment. Treatment for an abscess that does not rupture on its own may include:  Warm compresses applied to the area several times per day.  Incision and drainage. Your health care provider will make an incision to open the abscess and will remove pus and any foreign body or dead tissue. The incision area may be packed with gauze to keep it open for a few days while it heals.  Antibiotic medicines to treat infection. For a severe abscess, you may first get antibiotics through an IV and then change to oral  antibiotics.  Follow these instructions at home: Abscess Care  If you have an abscess that has not drained, place a warm, clean, wet washcloth  over the abscess several times a day. Do this as told by your health care provider.  Follow instructions from your health care provider about how to take care of your abscess. Make sure you: ? Cover the abscess with a bandage (dressing). ? Change your dressing or gauze as told by your health care provider. ? Wash your hands with soap and water before you change the dressing or gauze. If soap and water are not available, use hand sanitizer.  Check your abscess every day for signs of a worsening infection. Check for: ? More redness, swelling, or pain. ? More fluid or blood. ? Warmth. ? More pus or a bad smell. Medicines  Take over-the-counter and prescription medicines only as told by your health care provider.  If you were prescribed an antibiotic medicine, take it as told by your health care provider. Do not stop taking the antibiotic even if you start to feel better. General instructions  To avoid spreading the infection: ? Do not share personal care items, towels, or hot tubs with others. ? Avoid making skin contact with other people.  Keep all follow-up visits as told by your health care provider. This is important. Contact a health care provider if:  You have more redness, swelling, or pain around your abscess.  You have more fluid or blood coming from your abscess.  Your abscess feels warm to the touch.  You have more pus or a bad smell coming from your abscess.  You have a fever.  You have muscle aches.  You have chills or a general ill feeling. Get help right away if:  You have severe pain.  You see red streaks on your skin spreading away from the abscess. This information is not intended to replace advice given to you by your health care provider. Make sure you discuss any questions you have with your health care  provider. Document Released: 08/11/2005 Document Revised: 06/27/2016 Document Reviewed: 09/10/2015 Elsevier Interactive Patient Education  2018 [employer].     IF you received an x-ray today, you will receive an invoice from [facility]. Please contact [facility] at [phone] with questions or concerns regarding your invoice.   IF you received labwork today, you will receive an invoice from [place]. Please contact [facility] at [phone] with questions or concerns regarding your invoice.   Our billing staff will not be able to assist you with questions regarding bills from these companies.  You will be contacted with the lab results as soon as they are available. The fastest way to get your results is to activate your My Chart account. Instructions are located on the last page of this paperwork. If you have not heard from [place] regarding the results in 2 weeks, please contact this office.      I personally performed the services described in this documentation, which was scribed in my presence. The recorded information has been reviewed and considered for accuracy and completeness, addended by me as needed, and agree with information above.  Signed,   [name], MD Primary [facility] at [place].  09/08/17 2:22 PM

## 2017-09-10 ENCOUNTER — Encounter: Payer: Self-pay | Admitting: Urgent Care

## 2017-09-10 ENCOUNTER — Ambulatory Visit (INDEPENDENT_AMBULATORY_CARE_PROVIDER_SITE_OTHER): Payer: Medicare HMO | Admitting: Urgent Care

## 2017-09-10 VITALS — BP 142/70 | HR 71 | Temp 97.9°F | Resp 18 | Ht 71.5 in | Wt 232.0 lb

## 2017-09-10 DIAGNOSIS — L02419 Cutaneous abscess of limb, unspecified: Secondary | ICD-10-CM | POA: Diagnosis not present

## 2017-09-10 DIAGNOSIS — L03311 Cellulitis of abdominal wall: Secondary | ICD-10-CM | POA: Diagnosis not present

## 2017-09-10 DIAGNOSIS — S20411D Abrasion of right back wall of thorax, subsequent encounter: Secondary | ICD-10-CM

## 2017-09-10 NOTE — Patient Instructions (Addendum)
Cellulitis, Adult Cellulitis is a skin infection. The infected area is usually red and tender. This condition occurs most often in the arms and lower legs. The infection can travel to the muscles, blood, and underlying tissue and become serious. It is very important to get treated for this condition. What are the causes? Cellulitis is caused by bacteria. The bacteria enter through a break in the skin, such as a cut, burn, insect bite, open sore, or crack. What increases the risk? This condition is more likely to occur in people who:  Have a weak defense system (immune system).  Have open wounds on the skin such as cuts, burns, bites, and scrapes. Bacteria can enter the body through these open wounds.  Are older.  Have diabetes.  Have a type of long-lasting (chronic) liver disease (cirrhosis) or kidney disease.  Use IV drugs.  What are the signs or symptoms? Symptoms of this condition include:  Redness, streaking, or spotting on the skin.  Swollen area of the skin.  Tenderness or pain when an area of the skin is touched.  Warm skin.  Fever.  Chills.  Blisters.  How is this diagnosed? This condition is diagnosed based on a medical history and physical exam. You may also have tests, including:  Blood tests.  Lab tests.  Imaging tests.  How is this treated? Treatment for this condition may include:  Medicines, such as antibiotic medicines or antihistamines.  Supportive care, such as rest and application of cold or warm cloths (cold or warm compresses) to the skin.  Hospital care, if the condition is severe.  The infection usually gets better within 1-2 days of treatment. Follow these instructions at home:  Take over-the-counter and prescription medicines only as told by your health care provider.  If you were prescribed an antibiotic medicine, take it as told by your health care provider. Do not stop taking the antibiotic even if you start to feel  better.  Drink enough fluid to keep your urine clear or pale yellow.  Do not touch or rub the infected area.  Raise (elevate) the infected area above the level of your heart while you are sitting or lying down.  Apply warm or cold compresses to the affected area as told by your health care provider.  Keep all follow-up visits as told by your health care provider. This is important. These visits let your health care provider make sure a more serious infection is not developing. Contact a health care provider if:  You have a fever.  Your symptoms do not improve within 1-2 days of starting treatment.  Your bone or joint underneath the infected area becomes painful after the skin has healed.  Your infection returns in the same area or another area.  You notice a swollen bump in the infected area.  You develop new symptoms.  You have a general ill feeling (malaise) with muscle aches and pains. Get help right away if:  Your symptoms get worse.  You feel very sleepy.  You develop vomiting or diarrhea that persists.  You notice red streaks coming from the infected area.  Your red area gets larger or turns dark in color. This information is not intended to replace advice given to you by your health care provider. Make sure you discuss any questions you have with your health care provider. Document Released: 08/11/2005 Document Revised: 03/11/2016 Document Reviewed: 09/10/2015 Elsevier Interactive Patient Education  2017 [employer].      IF you received an x-ray  today, you will receive an invoice from [facility]. Please contact [facility] at [phone] with questions or concerns regarding your invoice.   IF you received labwork today, you will receive an invoice from [place]. Please contact [facility] at [phone] with questions or concerns regarding your invoice.   Our billing staff will not be able to assist you with questions regarding bills from  these companies.  You will be contacted with the lab results as soon as they are available. The fastest way to get your results is to activate your My Chart account. Instructions are located on the last page of this paperwork. If you have not heard from [place] regarding the results in 2 weeks, please contact this office.

## 2017-09-10 NOTE — Progress Notes (Signed)
    MRN: [id number] DOB: 09-15-1941  Subjective:   Brandon [name] is a 76 y.o. male presenting for follow up on abdominal abscess. At last OV, area was not amenable to I&D. Patient was advised to start doxycycline, use warm compresses. Follow up in 2 days for a recheck. Today, reports significant improvement in his symptoms. Denies fever, drainage, bleeding, warmth, red streaks. He has been taking doxycycline, using warm compresses, is keeping his wound over his elbow covered.  Brandon [name] has a current medication list which includes the following prescription(s): atorvastatin, doxycycline, escitalopram, ibuprofen, irbesartan, and omeprazole. Also has No Known Allergies.  Brandon [name]  has a past medical history of AAA (abdominal aortic aneurysm) ([place]); COPD (chronic obstructive pulmonary disease) ([place]); DIC (disseminated intravascular coagulation) ([place]) (09/09/2012); Leg wound, left (12/12/2013); Peripheral vascular disease ([place]); Respiratory failure, post-operative ([place]) (09/09/2012); and Shortness of breath. Also  has a past surgical history that includes Abdominal aortic aneurysm repair (09/08/2012) and Ganglion cyst excision.  Objective:   Vitals: BP (!) 142/70   Pulse 71   Temp 97.9 F (36.6 C) (Oral)   Resp 18   Ht 5' 11.5" (1.816 m)   Wt 232 lb (105.2 kg)   SpO2 96%   BMI 31.91 kg/m   Physical Exam  Constitutional: He is oriented to person, place, and time. He appears well-developed and well-nourished.  Cardiovascular: Normal rate.   Pulmonary/Chest: Effort normal.  Abdominal: Soft. Bowel sounds are normal. He exhibits no distension and no mass. There is no tenderness. There is no guarding.  Resolving cellulitis with central wound.  Musculoskeletal:       Right elbow: He exhibits swelling (trace). He exhibits normal range of motion, no effusion, no deformity and no laceration. No tenderness found.       Arms: Neurological: He is alert and oriented to person, place, and time.   Assessment  and Plan :   1. Cellulitis, abdominal wall 2. Abscess, elbow 3. Abrasion of right side of back, subsequent encounter - Resolving, continue wound care, warm compresses, finish antibiotic course and rtc at that point for a recheck.  Jaynee Eagles, PA-C [facility] [phone] 09/10/2017 3:42 PM

## 2017-09-11 LAB — WOUND CULTURE

## 2017-09-13 ENCOUNTER — Other Ambulatory Visit: Payer: Self-pay | Admitting: Family Medicine

## 2017-09-13 DIAGNOSIS — R12 Heartburn: Secondary | ICD-10-CM

## 2017-09-23 ENCOUNTER — Encounter: Payer: Self-pay | Admitting: Family Medicine

## 2017-09-23 ENCOUNTER — Ambulatory Visit (INDEPENDENT_AMBULATORY_CARE_PROVIDER_SITE_OTHER): Payer: Medicare HMO | Admitting: Family Medicine

## 2017-09-23 VITALS — BP 108/68 | HR 74 | Temp 97.4°F | Resp 16 | Ht 71.5 in | Wt 232.4 lb

## 2017-09-23 DIAGNOSIS — L03119 Cellulitis of unspecified part of limb: Secondary | ICD-10-CM

## 2017-09-23 DIAGNOSIS — L02211 Cutaneous abscess of abdominal wall: Secondary | ICD-10-CM

## 2017-09-23 NOTE — Progress Notes (Signed)
Subjective:  By signing my name below, I, Essence Howell, attest that this documentation has been prepared under the direction and in the presence of [name], MD Electronically Signed: Ladene Artist, ED Scribe 09/23/2017 at 1:40 PM.   Patient ID: Brandon [name], male    DOB: 12-25-1940, 76 y.o.   MRN: [id number]  Chief Complaint  Patient presents with  . Follow-up    follow up for cellulitis on R elbow   HPI Brandon [name] is a 76 y.o. male who presents to Primary Care at [facility] for cellulitis on R elbow and abdominal wall. Initially treated on 10/25. Had been expressing some exudate up until that time. He was treated with doxycycline, warm compresses. Wound culture staph aureus sensitive to doxycyline. F/u visit with Rosario Adie PA-C on 1/27. Areas were resolving.   Today, pt states that rash to his R elbow and abdomen have continued to improve significantly since starting doxycycline which he finished 3 days ago.  Patient Active Problem List   Diagnosis Date Noted  . Upper airway cough syndrome 07/08/2017  . Dyspnea on exertion 01/04/2017  . Obesity (BMI 30-39.9) 01/04/2017  . Essential hypertension 12/05/2015  . Peripheral vascular disease, unspecified ([place]) 03/12/2014  . Atherosclerosis of native arteries of the extremities with intermittent claudication 03/12/2014  . Physical deconditioning 09/19/2012  . Chronic obstructive pulmonary disease with bronchospasm ([place]) 09/11/2012  . AAA (abdominal aortic aneurysm, ruptured) ([place]) 09/09/2012  . Hyperglycemia 09/09/2012  . Former tobacco use 09/08/2012   Past Medical History:  Diagnosis Date  . AAA (abdominal aortic aneurysm) ([place])   . COPD (chronic obstructive pulmonary disease) ([place])   . DIC (disseminated intravascular coagulation) ([place]) 09/09/2012  . Leg wound, left 12/12/2013  . Peripheral vascular disease ([place])   . Respiratory failure, post-operative ([place]) 09/09/2012  . Shortness of breath    Past Surgical  History:  Procedure Laterality Date  . GANGLION CYST EXCISION     Right wrist   No Known Allergies Prior to Admission medications   Medication Sig Start Date End Date Taking? Authorizing Provider  atorvastatin (LIPITOR) 10 MG tablet  04/10/17   [provider]  doxycycline (VIBRA-TABS) 100 MG tablet Take 1 tablet (100 mg total) by mouth 2 (two) times daily. 09/08/17   [name], MD  escitalopram (LEXAPRO) 10 MG tablet Take 1 tablet by mouth daily. 12/25/16   [provider]  ibuprofen (ADVIL,MOTRIN) 200 MG tablet Take 200 mg by mouth every 6 (six) hours as needed.    [provider]  irbesartan (AVAPRO) 150 MG tablet Take 1 tablet (150 mg total) by mouth daily. 01/04/17   Tanda Rockers, MD  omeprazole (PRILOSEC) 20 MG capsule TAKE 1 CAPSULE BY MOUTH EVERY DAY 09/13/17   [name], MD   Social History   Socioeconomic History  . Marital status: Married    Spouse name: Not on file  . Number of children: Not on file  . Years of education: Not on file  . Highest education level: Not on file  Social Needs  . Financial resource strain: Not on file  . Food insecurity - worry: Not on file  . Food insecurity - inability: Not on file  . Transportation needs - medical: Not on file  . Transportation needs - non-medical: Not on file  Occupational History  . Not on file  Tobacco Use  . Smoking status: Former Smoker    Packs/day: 1.00    Years: 60.00  Pack years: 60.00    Last attempt to quit: 09/08/2012    Years since quitting: 5.0  . Smokeless tobacco: Never Used  Substance and Sexual Activity  . Alcohol use: No    Alcohol/week: 0.0 oz  . Drug use: No  . Sexual activity: Not Currently    Birth control/protection: None  Other Topics Concern  . Not on file  Social History Narrative  . Not on file   Review of Systems  Skin: Positive for rash (improving).      Objective:   Physical Exam  Constitutional: He is oriented to person, place,  and time. He appears well-developed and well-nourished. No distress.  HENT:  Head: Normocephalic and atraumatic.  Eyes: Conjunctivae and EOM are normal.  Neck: Neck supple. No tracheal deviation present.  Cardiovascular: Normal rate.  Pulmonary/Chest: Effort normal. No respiratory distress.  Musculoskeletal: Normal range of motion.  Neurological: He is alert and oriented to person, place, and time.  Skin: Skin is warm and dry.  Healing skin. Dry 3 mm eschar shallow on R extensor surface of elbow. No induration. No erythema. Healed skin over R lower abdominal well. No surrounding erythema. No open wounds.  Psychiatric: He has a normal mood and affect. His behavior is normal.  Nursing note and vitals reviewed.  Vitals:   09/23/17 1328  BP: 108/68  Pulse: 74  Resp: 16  Temp: (!) 97.4 F (36.3 C)  TempSrc: Oral  SpO2: 98%  Weight: 232 lb 6.4 oz (105.4 kg)  Height: 5' 11.5" (1.816 m)      Assessment & Plan:  Brandon [name] is a 76 y.o. male Abscess of abdominal wall  Cellulitis of elbow  Both areas appear to be healing, or healed. Status post antibiotics, no further intervention needed at present. RTC precautions if recurrence  No orders of the defined types were placed in this encounter.  Patient Instructions   Areas of skin infection look good. I do not think any other treatment is needed at this time. Let me know if you have any questions.   IF you received an x-ray today, you will receive an invoice from [facility] Radiology. Please contact [facility] Radiology at [phone] with questions or concerns regarding your invoice.   IF you received labwork today, you will receive an invoice from [place]. Please contact [facility] at [phone] with questions or concerns regarding your invoice.   Our billing staff will not be able to assist you with questions regarding bills from these companies.  You will be contacted with the lab results as soon as they are available.  The fastest way to get your results is to activate your My Chart account. Instructions are located on the last page of this paperwork. If you have not heard from [place] regarding the results in 2 weeks, please contact this office.      I personally performed the services described in this documentation, which was scribed in my presence. The recorded information has been reviewed and considered for accuracy and completeness, addended by me as needed, and agree with information above.  Signed,   [name], MD Primary Care at [place].  09/23/17 1:47 PM

## 2017-09-23 NOTE — Patient Instructions (Addendum)
Areas of skin infection look good. I do not think any other treatment is needed at this time. Let me know if you have any questions.   IF you received an x-ray today, you will receive an invoice from [facility]. Please contact [facility] Radiology at [phone] with questions or concerns regarding your invoice.   IF you received labwork today, you will receive an invoice from [place]. Please contact [facility] at [phone] with questions or concerns regarding your invoice.   Our billing staff will not be able to assist you with questions regarding bills from these companies.  You will be contacted with the lab results as soon as they are available. The fastest way to get your results is to activate your My Chart account. Instructions are located on the last page of this paperwork. If you have not heard from [place] regarding the results in 2 weeks, please contact this office.

## 2017-11-17 ENCOUNTER — Encounter: Payer: Self-pay | Admitting: Family Medicine

## 2017-11-17 ENCOUNTER — Other Ambulatory Visit: Payer: Self-pay

## 2017-11-17 ENCOUNTER — Ambulatory Visit (INDEPENDENT_AMBULATORY_CARE_PROVIDER_SITE_OTHER): Payer: Medicare HMO | Admitting: Family Medicine

## 2017-11-17 ENCOUNTER — Ambulatory Visit (INDEPENDENT_AMBULATORY_CARE_PROVIDER_SITE_OTHER): Payer: Medicare HMO

## 2017-11-17 VITALS — BP 124/68 | HR 69 | Temp 97.9°F | Resp 18 | Ht 71.5 in | Wt 235.0 lb

## 2017-11-17 DIAGNOSIS — J441 Chronic obstructive pulmonary disease with (acute) exacerbation: Secondary | ICD-10-CM | POA: Diagnosis not present

## 2017-11-17 DIAGNOSIS — Z8701 Personal history of pneumonia (recurrent): Secondary | ICD-10-CM

## 2017-11-17 DIAGNOSIS — R0609 Other forms of dyspnea: Secondary | ICD-10-CM | POA: Diagnosis not present

## 2017-11-17 DIAGNOSIS — R059 Cough, unspecified: Secondary | ICD-10-CM

## 2017-11-17 DIAGNOSIS — R05 Cough: Secondary | ICD-10-CM | POA: Diagnosis not present

## 2017-11-17 LAB — POCT CBC
Granulocyte percent: 74.3 %G (ref 37–80)
HCT, POC: 45 % (ref 43.5–53.7)
Hemoglobin: 15.3 g/dL (ref 14.1–18.1)
Lymph, poc: 1.5 (ref 0.6–3.4)
MCH, POC: 31.7 pg — AB (ref 27–31.2)
MCHC: 34 g/dL (ref 31.8–35.4)
MCV: 93.2 fL (ref 80–97)
MID (cbc): 0.6 (ref 0–0.9)
MPV: 8.2 fL (ref 0–99.8)
POC Granulocyte: 6.2 (ref 2–6.9)
POC LYMPH PERCENT: 18 %L (ref 10–50)
POC MID %: 7.7 %M (ref 0–12)
Platelet Count, POC: 259 10*3/uL (ref 142–424)
RBC: 4.82 M/uL (ref 4.69–6.13)
RDW, POC: 13.7 %
WBC: 8.4 10*3/uL (ref 4.6–10.2)

## 2017-11-17 MED ORDER — ALBUTEROL SULFATE HFA 108 (90 BASE) MCG/ACT IN AERS: 1.0000 | INHALATION_SPRAY | RESPIRATORY_TRACT | 0 refills | Status: DC | PRN

## 2017-11-17 MED ORDER — DOXYCYCLINE HYCLATE 100 MG PO TABS: 100.0000 mg | ORAL_TABLET | Freq: Two times a day (BID) | ORAL | 0 refills | Status: DC

## 2017-11-17 NOTE — Progress Notes (Signed)
Subjective:  By signing my name below, I, Brandon [name], attest that this documentation has been prepared under the direction and in the presence of Brandon Ray, MD. Electronically Signed: [name], [place]. 11/17/2017 , 6:45 PM .  Patient was seen in Room 10 .   Patient ID: Brandon [name], male    DOB: February 09, 1941, 77 y.o.   MRN: [id number] Chief Complaint  Patient presents with  . Cough    patient presents with cough and episodes of slight laryngitis. Patient states that cough is not productive. Patient states that he has had sx x 3 weeks   HPI Brandon [name] is a 77 y.o. male He has a history of COPD. Patient states his cough with congestion started about 3 weeks ago, and mentions his voice waxing and waning. He also mentions having some wheezing and shortness of breath on exertion that started about the same time, but he doesn't notice the breathing symptoms occurring everyday. He also notes legs aching occasionally, but improves after moving. He mentions his cough feels really loose sometimes. He denies history of heart failure. He denies fever, chest pain or chest tightness.   He was seen in Aug 2018 by Dr. Melvyn Novas with pulmonary, trial of stiolto and maximum treatment for GERD. He's not using any inhalers at home. He hasn't taken medications for COPD.   Patient Active Problem List   Diagnosis Date Noted  . Upper airway cough syndrome 07/08/2017  . Dyspnea on exertion 01/04/2017  . Obesity (BMI 30-39.9) 01/04/2017  . Essential hypertension 12/05/2015  . Peripheral vascular disease, unspecified ([place]) 03/12/2014  . Atherosclerosis of native arteries of the extremities with intermittent claudication 03/12/2014  . Physical deconditioning 09/19/2012  . Chronic obstructive pulmonary disease with bronchospasm ([place]) 09/11/2012  . AAA (abdominal aortic aneurysm, ruptured) ([place]) 09/09/2012  . Hyperglycemia 09/09/2012  . Former tobacco use 09/08/2012   Past Medical History:    Diagnosis Date  . AAA (abdominal aortic aneurysm) ([place])   . COPD (chronic obstructive pulmonary disease) ([place])   . DIC (disseminated intravascular coagulation) ([place]) 09/09/2012  . Leg wound, left 12/12/2013  . Peripheral vascular disease ([place])   . Respiratory failure, post-operative ([place]) 09/09/2012  . Shortness of breath    Past Surgical History:  Procedure Laterality Date  . ABDOMINAL AORTIC ANEURYSM REPAIR  09/08/2012   Procedure: ANEURYSM ABDOMINAL AORTIC REPAIR;  Surgeon: [name], MD;  Location: [place];  Service: Vascular;  Laterality: N/A;  open repair ruptured abdomenal aortic aneurysm  . GANGLION CYST EXCISION     Right wrist   No Known Allergies Prior to Admission medications   Medication Sig Start Date End Date Taking? Authorizing Provider  atorvastatin (LIPITOR) 10 MG tablet  04/10/17  Yes [provider]  ibuprofen (ADVIL,MOTRIN) 200 MG tablet Take 200 mg by mouth every 6 (six) hours as needed.   Yes [provider]  irbesartan (AVAPRO) 150 MG tablet Take 1 tablet (150 mg total) by mouth daily. 01/04/17  Yes Tanda Rockers, MD  omeprazole (PRILOSEC) 20 MG capsule TAKE 1 CAPSULE BY MOUTH EVERY DAY 09/13/17  Yes [name], MD  doxycycline (VIBRA-TABS) 100 MG tablet Take 1 tablet (100 mg total) by mouth 2 (two) times daily. Patient not taking: Reported on 11/17/2017 09/08/17   [name], MD  escitalopram (LEXAPRO) 10 MG tablet Take 1 tablet by mouth daily. 12/25/16   [provider]   Social History   Socioeconomic History  . Marital status:  Married    Spouse name: Not on file  . Number of children: Not on file  . Years of education: Not on file  . Highest education level: Not on file  Social Needs  . Financial resource strain: Not on file  . Food insecurity - worry: Not on file  . Food insecurity - inability: Not on file  . Transportation needs - medical: Not on file  . Transportation needs - non-medical: Not on file   Occupational History  . Not on file  Tobacco Use  . Smoking status: Former Smoker    Packs/day: 1.00    Years: 60.00    Pack years: 60.00    Last attempt to quit: 09/08/2012    Years since quitting: 5.1  . Smokeless tobacco: Never Used  Substance and Sexual Activity  . Alcohol use: No    Alcohol/week: 0.0 oz  . Drug use: No  . Sexual activity: Not Currently    Birth control/protection: None  Other Topics Concern  . Not on file  Social History Narrative  . Not on file   Review of Systems  Constitutional: Negative for fatigue and unexpected weight change.  HENT: Positive for congestion and voice change.   Eyes: Negative for visual disturbance.  Respiratory: Positive for cough, shortness of breath and wheezing. Negative for chest tightness.   Cardiovascular: Negative for chest pain, palpitations and leg swelling.  Gastrointestinal: Negative for abdominal pain and [name] in stool.  Neurological: Negative for dizziness, light-headedness and headaches.       Objective:   Physical Exam  Constitutional: He is oriented to person, place, and time. He appears well-developed and well-nourished.  HENT:  Head: Normocephalic and atraumatic.  Eyes: EOM are normal. Pupils are equal, round, and reactive to light.  Neck: No JVD present. Carotid bruit is not present.  Cardiovascular: Normal rate, regular rhythm and normal heart sounds.  No murmur heard. Pulmonary/Chest: Effort normal and breath sounds normal. He has no wheezes. He has no rales.  Lungs clear, but distant sounds  Musculoskeletal: He exhibits no edema.  No lower extremity edema, calves non tender  Neurological: He is alert and oriented to person, place, and time.  Skin: Skin is warm and dry.  Psychiatric: He has a normal mood and affect.  Vitals reviewed.   Vitals:   11/17/17 1753  BP: 124/68  Pulse: 69  Resp: 18  Temp: 97.9 F (36.6 C)  TempSrc: Oral  SpO2: 95%  Weight: 235 lb (106.6 kg)  Height: 5' 11.5"  (1.816 m)   Results for orders placed or performed in visit on 11/17/17  POCT CBC  Result Value Ref Range   WBC 8.4 4.6 - 10.2 K/uL   Lymph, poc 1.5 0.6 - 3.4   POC LYMPH PERCENT 18.0 10 - 50 %L   MID (cbc) 0.6 0 - 0.9   POC MID % 7.7 0 - 12 %M   POC Granulocyte 6.2 2 - 6.9   Granulocyte percent 74.3 37 - 80 %G   RBC 4.82 4.69 - 6.13 M/uL   Hemoglobin 15.3 14.1 - 18.1 g/dL   HCT, POC 45.0 43.5 - 53.7 %   MCV 93.2 80 - 97 fL   MCH, POC 31.7 (A) 27 - 31.2 pg   MCHC 34.0 31.8 - 35.4 g/dL   RDW, POC 13.7 %   Platelet Count, POC 259 142 - 424 K/uL   MPV 8.2 0 - 99.8 fL   Dg Chest 2 View  Result Date:  11/17/2017 CLINICAL DATA:  Cough for 3 weeks.  Dyspnea and times. EXAM: CHEST  2 VIEW COMPARISON:  06/02/2017 FINDINGS: Emphysematous changes in the lungs with scattered fibrosis. No airspace disease or consolidation. No blunting of costophrenic angles. No pneumothorax. Heart size and pulmonary vascularity are normal. Mediastinal contours appear intact. IMPRESSION: Emphysematous changes and fibrosis in the lungs. No evidence of active pulmonary disease. Electronically Signed   By: Lucienne Capers M.D.   On: 11/17/2017 18:57       Assessment & Plan:    ELVY BUDNY is a 77 y.o. male History of bacterial pneumonia - Plan: DG Chest 2 View, POCT CBC  Cough - Plan: POCT CBC, CANCELED: CBC  DOE (dyspnea on exertion) - Plan: POCT CBC  COPD exacerbation ([place]) - Plan: doxycycline (VIBRA-TABS) 100 MG tablet, albuterol (PROVENTIL HFA;VENTOLIN HFA) 108 (90 Base) MCG/ACT inhaler  Chest x-[name] without acute infiltrate. Suspected COPD component along with initial viral syndrome.   -Based on persistence of symptoms/phlegm, will start doxycycline.   -No wheeze on exam, but reported wheeze at home. Albuterol prescribed with RTC precautions if frequent/persistent use. Either way will recheck in 4 days to determine if prednisone will be needed. Sooner if worse. RTC/ER precautions discussed  Meds  ordered this encounter  Medications  . doxycycline (VIBRA-TABS) 100 MG tablet    Sig: Take 1 tablet (100 mg total) by mouth 2 (two) times daily.    Dispense:  20 tablet    Refill:  0  . albuterol (PROVENTIL HFA;VENTOLIN HFA) 108 (90 Base) MCG/ACT inhaler    Sig: Inhale 1-2 puffs into the lungs every 4 (four) hours as needed for wheezing or shortness of breath.    Dispense:  1 Inhaler    Refill:  0   Patient Instructions    Albuterol if needed for wheeze, but if you require that more than a few times per day, or more frequently than 4-6 hours - recheck here or emergency room.   Start doxycycline for cough and possible flare of COPD.  If there are signs of pneumonia on xray, I will let you know as a change in meds may be needed. If cough is not improving in next few days, then may need prednisone as well. Follow up if not improving in next few days.   Return to the clinic or go to the nearest emergency room if any of your symptoms worsen or new symptoms occur.    Cough, Adult A cough helps to clear your throat and lungs. A cough may last only 2-3 weeks (acute), or it may last longer than 8 weeks (chronic). Many different things can cause a cough. A cough may be a sign of an illness or another medical condition. Follow these instructions at home:  Pay attention to any changes in your cough.  Take medicines only as told by your doctor. ? If you were prescribed an antibiotic medicine, take it as told by your doctor. Do not stop taking it even if you start to feel better. ? Talk with your doctor before you try using a cough medicine.  Drink enough fluid to keep your pee (urine) clear or pale yellow.  If the air is dry, use a cold steam vaporizer or humidifier in your home.  Stay away from things that make you cough at work or at home.  If your cough is worse at night, try using extra pillows to raise your head up higher while you sleep.  Do not smoke, and  try not to be around smoke.  If you need help quitting, ask your doctor.  Do not have caffeine.  Do not drink alcohol.  Rest as needed. Contact a doctor if:  You have new problems (symptoms).  You cough up yellow fluid (pus).  Your cough does not get better after 2-3 weeks, or your cough gets worse.  Medicine does not help your cough and you are not sleeping well.  You have pain that gets worse or pain that is not helped with medicine.  You have a fever.  You are losing weight and you do not know why.  You have night sweats. Get help right away if:  You cough up [name].  You have trouble breathing.  Your heartbeat is very fast. This information is not intended to replace advice given to you by your health care provider. Make sure you discuss any questions you have with your health care provider. Document Released: 07/15/2011 Document Revised: 04/08/2016 Document Reviewed: 01/08/2015 Elsevier Interactive Patient Education  2018 [employer].   IF you received an x-[name] today, you will receive an invoice from [facility] Radiology. Please contact [facility] Radiology at [phone] with questions or concerns regarding your invoice.   IF you received labwork today, you will receive an invoice from [place]. Please contact [facility] at [phone] with questions or concerns regarding your invoice.   Our billing staff will not be able to assist you with questions regarding bills from these companies.  You will be contacted with the lab results as soon as they are available. The fastest way to get your results is to activate your My Chart account. Instructions are located on the last page of this paperwork. If you have not heard from [place] regarding the results in 2 weeks, please contact this office.      I personally performed the services described in this documentation, which was scribed in my presence. The recorded information has been reviewed and considered for accuracy and completeness, addended by me  as needed, and agree with information above.  Signed,   Brandon Ray, MD Primary Care at [place].  11/18/17 3:14 PM

## 2017-11-17 NOTE — Patient Instructions (Addendum)
Albuterol if needed for wheeze, but if you require that more than a few times per day, or more frequently than 4-6 hours - recheck here or emergency room.   Start doxycycline for cough and possible flare of COPD.  If there are signs of pneumonia on xray, I will let you know as a change in meds may be needed. If cough is not improving in next few days, then may need prednisone as well. Follow up if not improving in next few days.   Return to the clinic or go to the nearest emergency room if any of your symptoms worsen or new symptoms occur.    Cough, Adult A cough helps to clear your throat and lungs. A cough may last only 2-3 weeks (acute), or it may last longer than 8 weeks (chronic). Many different things can cause a cough. A cough may be a sign of an illness or another medical condition. Follow these instructions at home:  Pay attention to any changes in your cough.  Take medicines only as told by your doctor. ? If you were prescribed an antibiotic medicine, take it as told by your doctor. Do not stop taking it even if you start to feel better. ? Talk with your doctor before you try using a cough medicine.  Drink enough fluid to keep your pee (urine) clear or pale yellow.  If the air is dry, use a cold steam vaporizer or humidifier in your home.  Stay away from things that make you cough at work or at home.  If your cough is worse at night, try using extra pillows to raise your head up higher while you sleep.  Do not smoke, and try not to be around smoke. If you need help quitting, ask your doctor.  Do not have caffeine.  Do not drink alcohol.  Rest as needed. Contact a doctor if:  You have new problems (symptoms).  You cough up yellow fluid (pus).  Your cough does not get better after 2-3 weeks, or your cough gets worse.  Medicine does not help your cough and you are not sleeping well.  You have pain that gets worse or pain that is not helped with medicine.  You  have a fever.  You are losing weight and you do not know why.  You have night sweats. Get help right away if:  You cough up blood.  You have trouble breathing.  Your heartbeat is very fast. This information is not intended to replace advice given to you by your health care provider. Make sure you discuss any questions you have with your health care provider. Document Released: 07/15/2011 Document Revised: 04/08/2016 Document Reviewed: 01/08/2015 Elsevier Interactive Patient Education  2018 [employer].   IF you received an x-ray today, you will receive an invoice from [facility] Radiology. Please contact [facility] Radiology at [phone] with questions or concerns regarding your invoice.   IF you received labwork today, you will receive an invoice from [place]. Please contact [facility] at [phone] with questions or concerns regarding your invoice.   Our billing staff will not be able to assist you with questions regarding bills from these companies.  You will be contacted with the lab results as soon as they are available. The fastest way to get your results is to activate your My Chart account. Instructions are located on the last page of this paperwork. If you have not heard from [place] regarding the results in 2 weeks, please contact this office.

## 2017-11-18 ENCOUNTER — Encounter: Payer: Self-pay | Admitting: Family Medicine

## 2017-11-21 ENCOUNTER — Ambulatory Visit (INDEPENDENT_AMBULATORY_CARE_PROVIDER_SITE_OTHER): Payer: Medicare HMO | Admitting: Family Medicine

## 2017-11-21 ENCOUNTER — Encounter: Payer: Self-pay | Admitting: Family Medicine

## 2017-11-21 ENCOUNTER — Other Ambulatory Visit: Payer: Self-pay

## 2017-11-21 VITALS — BP 140/72 | HR 69 | Temp 98.5°F | Resp 18 | Ht 71.5 in | Wt 234.4 lb

## 2017-11-21 DIAGNOSIS — R059 Cough, unspecified: Secondary | ICD-10-CM

## 2017-11-21 DIAGNOSIS — R05 Cough: Secondary | ICD-10-CM | POA: Diagnosis not present

## 2017-11-21 DIAGNOSIS — J441 Chronic obstructive pulmonary disease with (acute) exacerbation: Secondary | ICD-10-CM

## 2017-11-21 NOTE — Patient Instructions (Addendum)
I'm glad to hear you are doing better today. Continue the antibiotic until it is completed. If any wheezing, or worsening shortness of breath - would recommend using the albuterol inhaler. Return to the clinic or go to the nearest emergency room if any of your symptoms worsen or new symptoms occur.   IF you received an x-ray today, you will receive an invoice from [facility]. Please contact [facility] Radiology at [phone] with questions or concerns regarding your invoice.   IF you received labwork today, you will receive an invoice from [place]. Please contact [facility] at [phone] with questions or concerns regarding your invoice.   Our billing staff will not be able to assist you with questions regarding bills from these companies.  You will be contacted with the lab results as soon as they are available. The fastest way to get your results is to activate your My Chart account. Instructions are located on the last page of this paperwork. If you have not heard from [place] regarding the results in 2 weeks, please contact this office.

## 2017-11-21 NOTE — Progress Notes (Signed)
Subjective:  By signing my name below, I, Brandon [name], attest that this documentation has been prepared under the direction and in the presence of Brandon Ray, MD. Electronically Signed: [name], [place]. 11/21/2017 , 5:32 PM .  Patient was seen in Room 10 .   Patient ID: ASHAWN RINEHART, male    DOB: [birth year], 77 y.o.   MRN: [id number] Chief Complaint  Patient presents with  . Cough    follow up    HPI Brandon [name] is a 77 y.o. male Here for follow up of cough. Patient was seen on Jan 3rd with 3 weeks of coughing and episodic wheeze; suspected COPD exacerbation. He was started on doxycycline with PRN albuterol. Prednisone was not started initially with lungs overall clear on initial exam. Chest xray without active pulmonary disease but emphysema changes and fibrosis.   Patient states his breathing is doing much better. He wasn't able to get the inhaler due to cost; his insurance only covered partially but would cost him $58 out of pocket. He denies fever or chills.   Patient Active Problem List   Diagnosis Date Noted  . Upper airway cough syndrome 07/08/2017  . Dyspnea on exertion 01/04/2017  . Obesity (BMI 30-39.9) 01/04/2017  . Essential hypertension 12/05/2015  . Peripheral vascular disease, unspecified ([place]) 03/12/2014  . Atherosclerosis of native arteries of the extremities with intermittent claudication 03/12/2014  . Physical deconditioning 09/19/2012  . Chronic obstructive pulmonary disease with bronchospasm ([place]) 09/11/2012  . AAA (abdominal aortic aneurysm, ruptured) ([place]) 09/09/2012  . Hyperglycemia 09/09/2012  . Former tobacco use 09/08/2012   Past Medical History:  Diagnosis Date  . AAA (abdominal aortic aneurysm) ([place])   . COPD (chronic obstructive pulmonary disease) ([place])   . DIC (disseminated intravascular coagulation) ([place]) 09/09/2012  . Leg wound, left 12/12/2013  . Peripheral vascular disease ([place])   . Respiratory failure, post-operative ([place])  09/09/2012  . Shortness of breath    Past Surgical History:  Procedure Laterality Date  . ABDOMINAL AORTIC ANEURYSM REPAIR  09/08/2012   Procedure: ANEURYSM ABDOMINAL AORTIC REPAIR;  Surgeon: [name], MD;  Location: [facility];  Service: Vascular;  Laterality: N/A;  open repair ruptured abdomenal aortic aneurysm  . GANGLION CYST EXCISION     Right wrist   No Known Allergies Prior to Admission medications   Medication Sig Start Date End Date Taking? Authorizing Provider  albuterol (PROVENTIL HFA;VENTOLIN HFA) 108 (90 Base) MCG/ACT inhaler Inhale 1-2 puffs into the lungs every 4 (four) hours as needed for wheezing or shortness of breath. 11/17/17   [name], MD  atorvastatin (LIPITOR) 10 MG tablet  04/10/17   [provider]  doxycycline (VIBRA-TABS) 100 MG tablet Take 1 tablet (100 mg total) by mouth 2 (two) times daily. 11/17/17   [name], MD  escitalopram (LEXAPRO) 10 MG tablet Take 1 tablet by mouth daily. 12/25/16   [provider]  ibuprofen (ADVIL,MOTRIN) 200 MG tablet Take 200 mg by mouth every 6 (six) hours as needed.    [provider]  irbesartan (AVAPRO) 150 MG tablet Take 1 tablet (150 mg total) by mouth daily. 01/04/17   Tanda Rockers, MD  omeprazole (PRILOSEC) 20 MG capsule TAKE 1 CAPSULE BY MOUTH EVERY DAY 09/13/17   [name], MD   Social History   Socioeconomic History  . Marital status: Married    Spouse name: Not on file  . Number of children: Not on file  . Years  of education: Not on file  . Highest education level: Not on file  Social Needs  . Financial resource strain: Not on file  . Food insecurity - worry: Not on file  . Food insecurity - inability: Not on file  . Transportation needs - medical: Not on file  . Transportation needs - non-medical: Not on file  Occupational History  . Not on file  Tobacco Use  . Smoking status: Former Smoker    Packs/day: 1.00    Years: 60.00    Pack years: 60.00    Last  attempt to quit: 09/08/2012    Years since quitting: 5.2  . Smokeless tobacco: Never Used  Substance and Sexual Activity  . Alcohol use: No    Alcohol/week: 0.0 oz  . Drug use: No  . Sexual activity: Not Currently    Birth control/protection: None  Other Topics Concern  . Not on file  Social History Narrative  . Not on file   Review of Systems  Constitutional: Negative for chills, fatigue, fever and unexpected weight change.  Eyes: Negative for visual disturbance.  Respiratory: Negative for cough, chest tightness, shortness of breath and wheezing.   Cardiovascular: Negative for chest pain, palpitations and leg swelling.  Gastrointestinal: Negative for abdominal pain and [name] in stool.  Neurological: Negative for dizziness, light-headedness and headaches.       Objective:   Physical Exam  Constitutional: He is oriented to person, place, and time. He appears well-developed and well-nourished. No distress.  HENT:  Head: Normocephalic and atraumatic.  Eyes: EOM are normal. Pupils are equal, round, and reactive to light.  Neck: Neck supple.  Cardiovascular: Normal rate.  Pulmonary/Chest: Effort normal. No respiratory distress.  Musculoskeletal: Normal range of motion.  Neurological: He is alert and oriented to person, place, and time.  Skin: Skin is warm and dry.  Psychiatric: He has a normal mood and affect. His behavior is normal.  Nursing note and vitals reviewed.   Vitals:   11/21/17 1635  BP: 140/72  Pulse: 69  Resp: 18  Temp: 98.5 F (36.9 C)  TempSrc: Oral  SpO2: 98%  Weight: 234 lb 6.4 oz (106.3 kg)  Height: 5' 11.5" (1.816 m)      Assessment & Plan:   Brandon [name] is a 77 y.o. male COPD exacerbation (HCC)  Cough  - Significantly improved cough and symptoms as above with doxycycline. No apparent wheeze on exam at present, and has not picked up the albuterol inhaler. dyspnea has also improved and vital signs overall reassuring.   - We'll hold on  prednisone or further changes for now, continue doxycycline and RTC precautions.   -Discount cards were provided to see if that helps with cost of albuterol if he does need that medication.  No orders of the defined types were placed in this encounter.  Patient Instructions   I'm glad to hear you are doing better today. Continue the antibiotic until it is completed. If any wheezing, or worsening shortness of breath - would recommend using the albuterol inhaler. Return to the clinic or go to the nearest emergency room if any of your symptoms worsen or new symptoms occur.   IF you received an x-[name] today, you will receive an invoice from [facility]. Please contact [facility] at [phone] with questions or concerns regarding your invoice.   IF you received labwork today, you will receive an invoice from [facility]. Please contact [facility] at [phone] with questions or concerns regarding your invoice.  Our billing staff will not be able to assist you with questions regarding bills from these companies.  You will be contacted with the lab results as soon as they are available. The fastest way to get your results is to activate your My Chart account. Instructions are located on the last page of this paperwork. If you have not heard from [place] regarding the results in 2 weeks, please contact this office.      I personally performed the services described in this documentation, which was scribed in my presence. The recorded information has been reviewed and considered for accuracy and completeness, addended by me as needed, and agree with information above.  Signed,   Brandon Ray, MD [facility].  11/23/17 12:39 PM

## 2017-12-05 IMAGING — CR DG CHEST 2V
2 series · 2 of 2 positions shown · non-contrast
Comparison: 10/03/2012

CLINICAL DATA: Sore throat, chills

EXAM:
CHEST  2 VIEW

[PA]
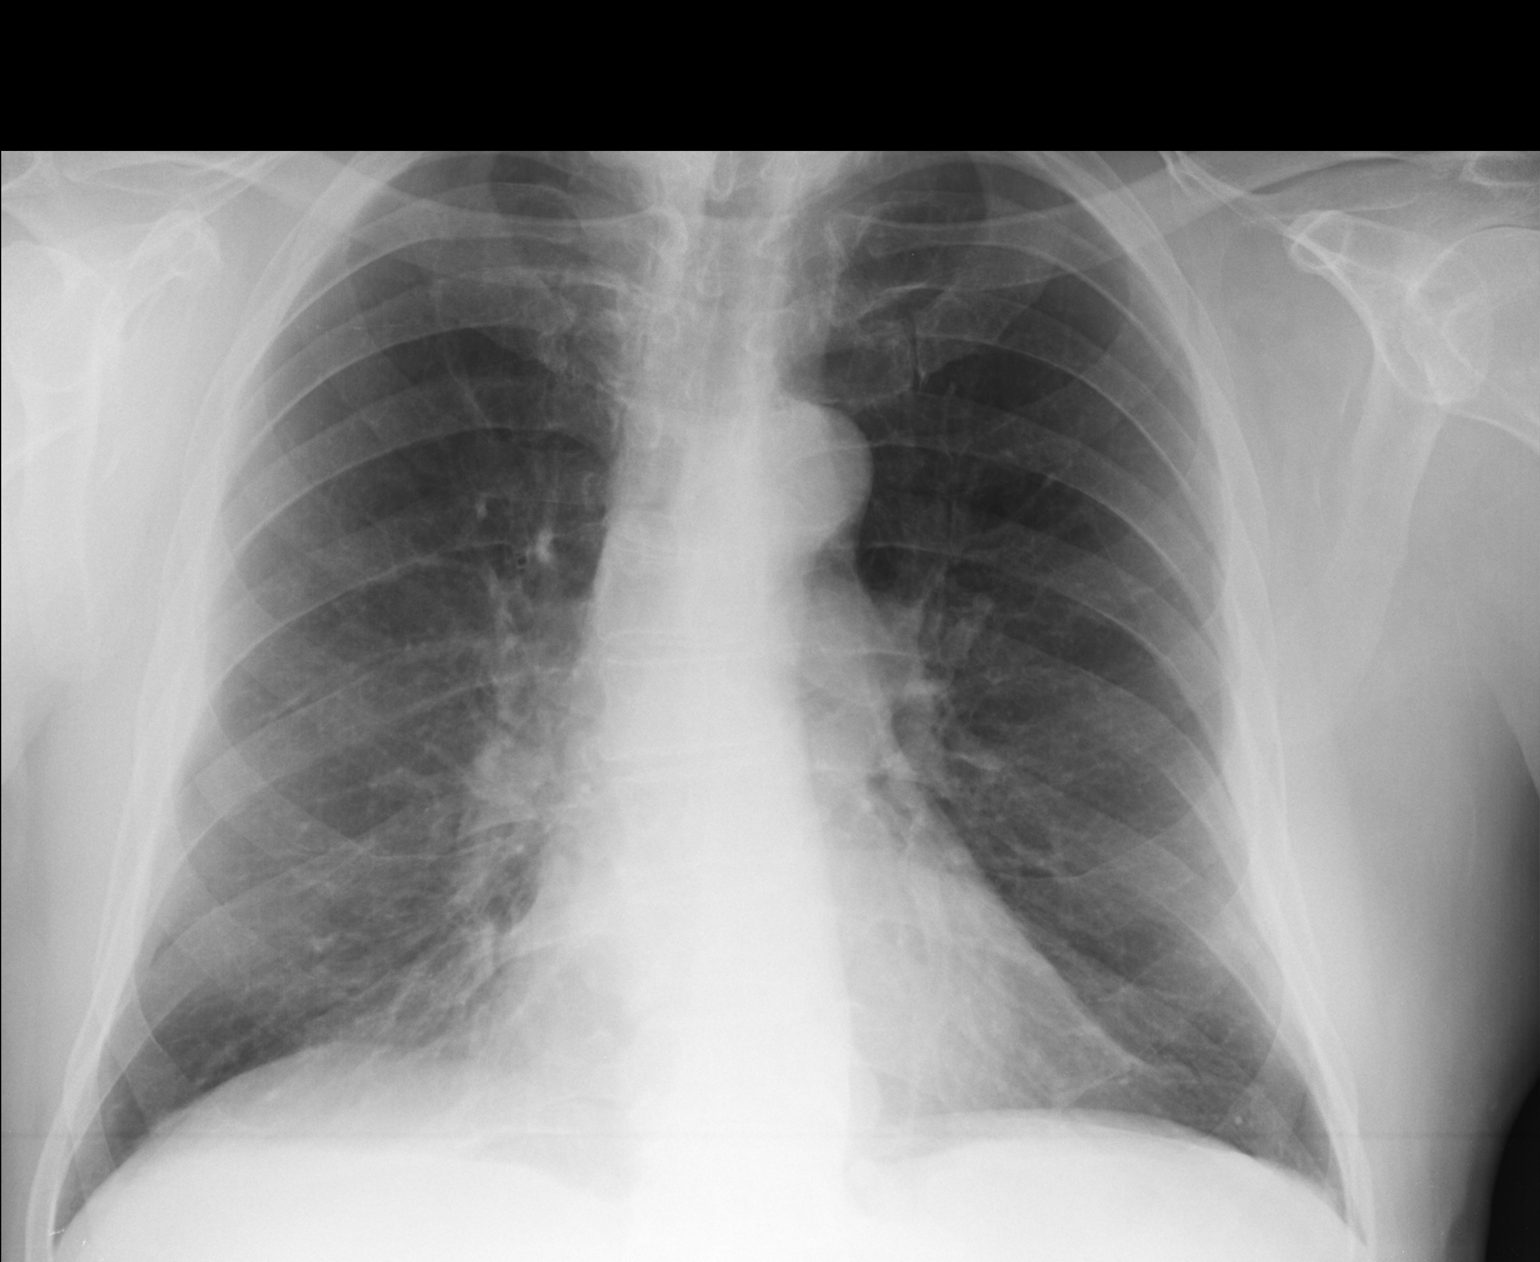

[lateral]
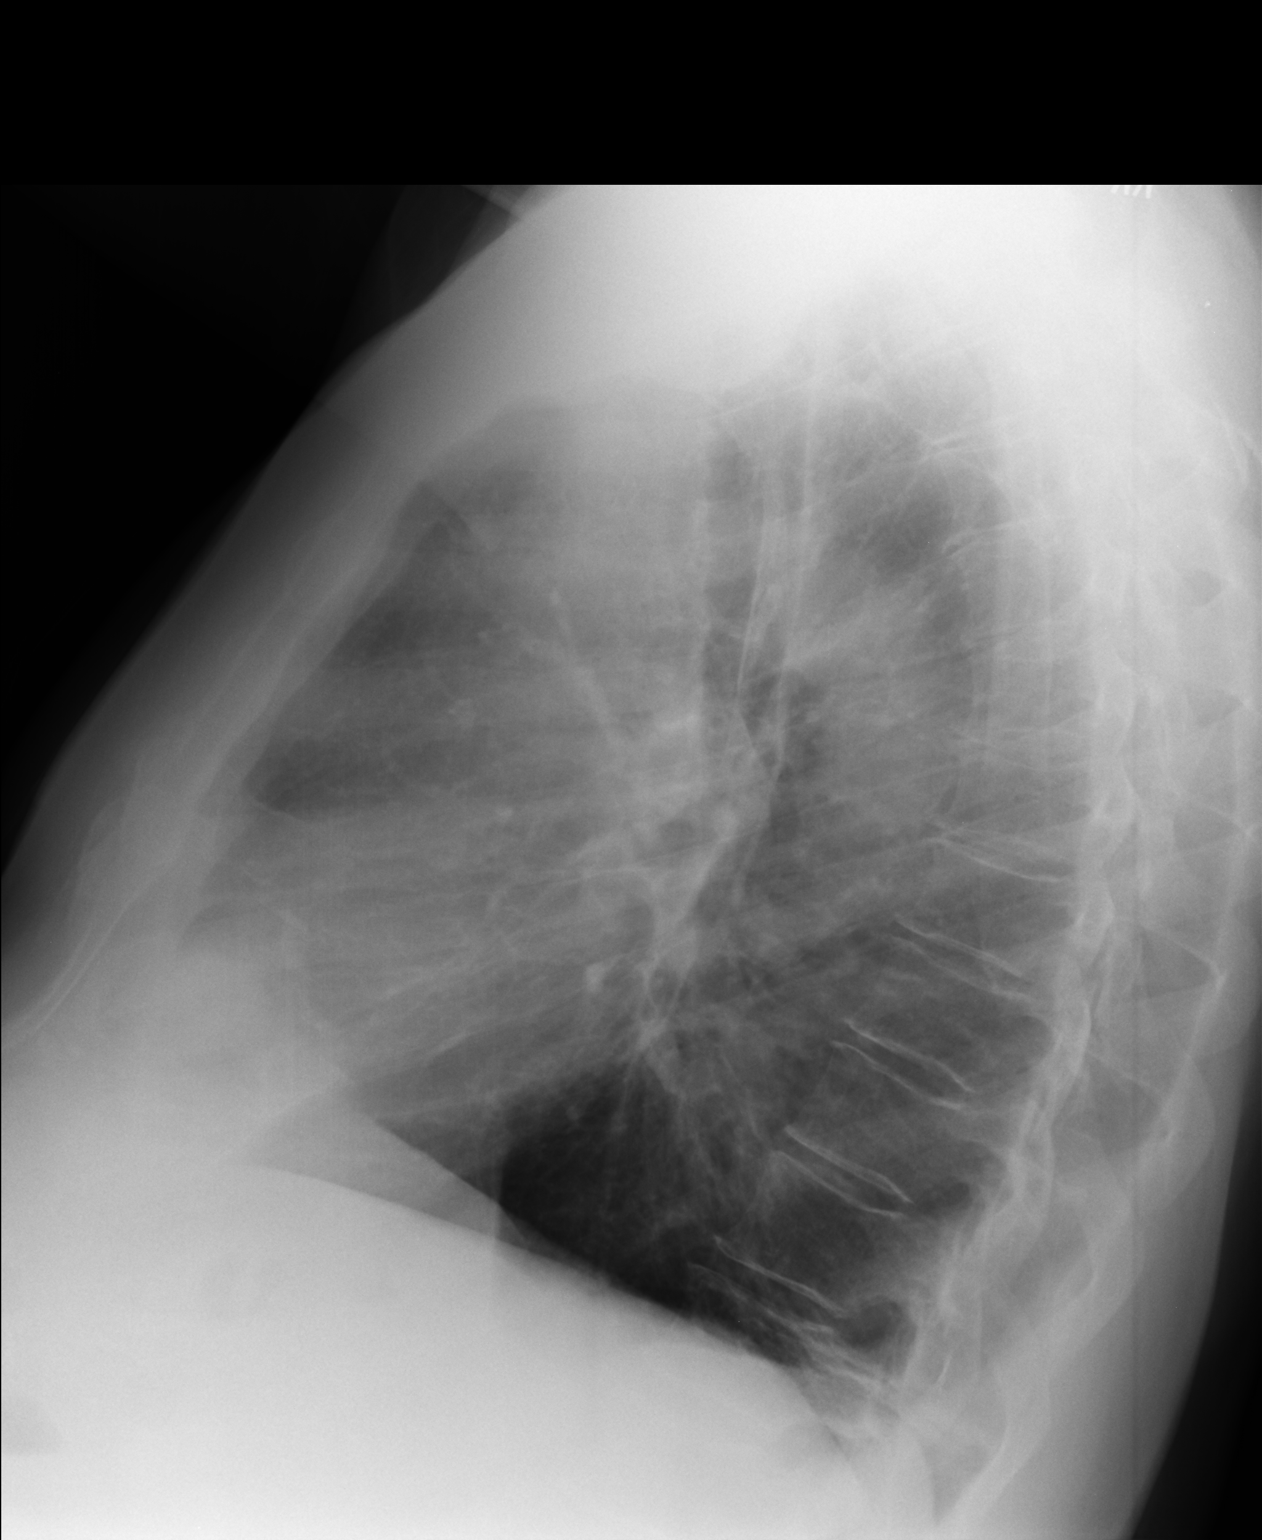

[2 of 2 positions shown; findings below may reference images not displayed]

FINDINGS: Cardiomediastinal silhouette is stable. Mild dextroscoliosis mid
thoracic spine. No acute infiltrate or pleural effusion. No
pulmonary edema. Mild degenerative changes mid thoracic spine.
IMPRESSION: No active cardiopulmonary disease. Mild dextroscoliosis and
degenerative changes mid thoracic spine.

## 2018-04-21 ENCOUNTER — Ambulatory Visit (HOSPITAL_COMMUNITY)
Admission: RE | Admit: 2018-04-21 | Discharge: 2018-04-21 | Disposition: A | Discharge status: Home / Self Care | Payer: Medicare HMO | Source: Ambulatory Visit | Attending: Family | Admitting: Family

## 2018-04-21 ENCOUNTER — Encounter: Payer: Self-pay | Admitting: Family

## 2018-04-21 ENCOUNTER — Ambulatory Visit (INDEPENDENT_AMBULATORY_CARE_PROVIDER_SITE_OTHER): Payer: Medicare HMO | Admitting: Family

## 2018-04-21 VITALS — BP 120/67 | HR 62 | Temp 97.0°F | Ht 71.5 in | Wt 228.6 lb

## 2018-04-21 DIAGNOSIS — I713 Abdominal aortic aneurysm, ruptured, unspecified: Secondary | ICD-10-CM

## 2018-04-21 DIAGNOSIS — I10 Essential (primary) hypertension: Secondary | ICD-10-CM | POA: Insufficient documentation

## 2018-04-21 DIAGNOSIS — I739 Peripheral vascular disease, unspecified: Secondary | ICD-10-CM | POA: Diagnosis not present

## 2018-04-21 DIAGNOSIS — Z8679 Personal history of other diseases of the circulatory system: Secondary | ICD-10-CM | POA: Diagnosis not present

## 2018-04-21 DIAGNOSIS — I714 Abdominal aortic aneurysm, without rupture: Secondary | ICD-10-CM | POA: Diagnosis not present

## 2018-04-21 DIAGNOSIS — Z9889 Other specified postprocedural states: Secondary | ICD-10-CM

## 2018-04-21 NOTE — Progress Notes (Signed)
[facility]  CC: Follow up open repair of ruptured AAA   History of Present Illness  Brandon [name] is a 77 y.o. ([birth year]) male who is status post repair of ruptured abdominal aortic aneurysm resection on 09/08/2012 by Dr. Kellie Simmering.  He also had bilateral extensive lower extremity thrombectomies   He has chronic COPD and did require home oxygen for a period of time following discharge from the hospital. He is off of his oxygen and has not smoked a cigarette since his surgery.  He can walk as far as he wants with no claudication is noted. He does have left knee pain occasionally. He has a good appetite.  He returns today for ABI's.  He remains tobacco free and happy to be so. He denies abdominal or back pain.  He denies any history of stroke or TIA.  Diabetic: No Tobacco use former smoker, quit in October, 2013  He does not take a daily ASA, he does take a daily statin.    Past Medical History:  Diagnosis Date  . AAA (abdominal aortic aneurysm) ([place])   . COPD (chronic obstructive pulmonary disease) ([place])   . DIC (disseminated intravascular coagulation) ([place]) 09/09/2012  . Leg wound, left 12/12/2013  . Peripheral vascular disease ([place])   . Respiratory failure, post-operative ([place]) 09/09/2012  . Shortness of breath     Past Surgical History:  Procedure Laterality Date  . ABDOMINAL AORTIC ANEURYSM REPAIR  09/08/2012   Procedure: ANEURYSM ABDOMINAL AORTIC REPAIR;  Surgeon: [name], MD;  Location: [place];  Service: Vascular;  Laterality: N/A;  open repair ruptured abdomenal aortic aneurysm  . GANGLION CYST EXCISION     Right wrist   Social History Social History   Socioeconomic History  . Marital status: Married    Spouse name: Not on file  . Number of children: Not on file  . Years of education: Not on file  . Highest education level: Not on file  Occupational History  . Not on file  Social Needs  . Financial resource strain: Not  on file  . Food insecurity:    Worry: Not on file    Inability: Not on file  . Transportation needs:    Medical: Not on file    Non-medical: Not on file  Tobacco Use  . Smoking status: Former Smoker    Packs/day: 1.00    Years: 60.00    Pack years: 60.00    Last attempt to quit: 09/08/2012    Years since quitting: 5.6  . Smokeless tobacco: Never Used  Substance and Sexual Activity  . Alcohol use: No    Alcohol/week: 0.0 oz  . Drug use: No  . Sexual activity: Not Currently    Birth control/protection: None  Lifestyle  . Physical activity:    Days per week: Not on file    Minutes per session: Not on file  . Stress: Not on file  Relationships  . Social connections:    Talks on phone: Not on file    Gets together: Not on file    Attends religious service: Not on file    Active member of club or organization: Not on file    Attends meetings of clubs or organizations: Not on file    Relationship status: Not on file  . Intimate partner violence:    Fear of current or ex partner: Not on file    Emotionally abused: Not on file    Physically abused: Not  on file    Forced sexual activity: Not on file  Other Topics Concern  . Not on file  Social History Narrative  . Not on file   Family History Family History  Problem Relation Age of Onset  . Dementia Mother   . Cancer Mother   . Cancer Father   . Heart disease Father    Current Outpatient Medications on File Prior to Visit  Medication Sig Dispense Refill  . atorvastatin (LIPITOR) 10 MG tablet     . ibuprofen (ADVIL,MOTRIN) 200 MG tablet Take 200 mg by mouth every 6 (six) hours as needed.    . irbesartan (AVAPRO) 150 MG tablet Take 1 tablet (150 mg total) by mouth daily. 30 tablet 11   No current facility-administered medications on file prior to visit.    No Known Allergies  ROS: See HPI for pertinent positives and negatives.    Physical Examination  Vitals:   04/21/18 1432  BP: 120/67  Pulse: 62  Temp: (!)  97 F (36.1 C)  TempSrc: Oral  SpO2: 96%  Weight: 228 lb 9.6 oz (103.7 kg)  Height: 5' 11.5" (1.816 m)   Body mass index is 31.44 kg/m.  General: A&O x 3, WD obese male. HEENT: WNL, no gross abnormalities.  Pulmonary: Respirations are non labored, good air movement in all fields, CTAB, no rales, rhonchi, or wheezing. Cardiac: RRR, Nl S1, S2, no detected murmur.   Carotid Bruits Left Right   Negative Negative    Abdominal aortic pulse is not palpable Radial pulses are 2+ palpable and =.                          VASCULAR EXAM:                                                                                                                                           LE Pulses LEFT RIGHT       FEMORAL 2+ palpable 2+ palpable       POPLITEAL  not palpable   not palpable       POSTERIOR TIBIAL  2+ palpable   2+ palpable        DORSALIS PEDIS      ANTERIOR TIBIAL not palpable  not palpable      Gastrointestinal: soft, NTND, -G/R, - HSM, moderate sized ventral hernia, large panus, - CVAT B. Musculoskeletal: M/S 5/5 throughout, Extremities without ischemic changes. Skin: No rash, no cellulitis, no ulcers.  Neurologic: CN 2-12 intact, Pain and light touch intact in extremities are intact, Motor exam as listed above. Psychiatric: Normal thought content, mood appropriate to clinical situation.    DATA  ABI (Date: 04/21/2018):  R:   ABI: 1.17 (was 1.14 on 04-15-17),   PT: tri  DP: tri  TBI:  0.94 (was 1.00)  L:   ABI: 1.18 (  was 1.10),   PT: tri  DP: tri  TBI: 0.96 (was 0.81)  Stable and normal bilateral ABI with all triphasic waveforms.     Medical Decision Making  Brandon [name] is a 77 y.o. male who presents s/p open AAA repair.  Pt is asymptomatic, no back or abdominal pain, no claudication sx's with walking.     The next ABI will be scheduled for 18 months.  The patient will follow up with [place] in 18 months with these studies.  I discussed  in depth with the patient the nature of atherosclerosis, and emphasized the importance of maximal medical management including strict control of blood pressure, blood glucose, and lipid levels, obtaining regular exercise, and cessation of smoking.    The patient is aware that without maximal medical management the underlying atherosclerotic disease process will progress, limiting the benefit of any interventions.   Thank you for allowing [place] to participate in this patient's care.  Clemon Chambers, RN, MSN, FNP-C [facility] Office: [phone]   Clinic Physician: Donzetta Matters   04/21/2018, 3:19 PM

## 2018-04-24 ENCOUNTER — Ambulatory Visit (INDEPENDENT_AMBULATORY_CARE_PROVIDER_SITE_OTHER): Payer: Medicare HMO | Admitting: Family Medicine

## 2018-04-24 ENCOUNTER — Encounter: Payer: Self-pay | Admitting: Family Medicine

## 2018-04-24 ENCOUNTER — Other Ambulatory Visit: Payer: Self-pay

## 2018-04-24 VITALS — BP 122/78 | HR 65 | Temp 97.3°F | Ht 71.5 in | Wt 229.8 lb

## 2018-04-24 DIAGNOSIS — L02419 Cutaneous abscess of limb, unspecified: Secondary | ICD-10-CM

## 2018-04-24 DIAGNOSIS — L03114 Cellulitis of left upper limb: Secondary | ICD-10-CM | POA: Diagnosis not present

## 2018-04-24 MED ORDER — DOXYCYCLINE HYCLATE 100 MG PO TABS: 100.0000 mg | ORAL_TABLET | Freq: Two times a day (BID) | ORAL | 0 refills | Status: DC

## 2018-04-24 MED ORDER — CEFTRIAXONE SODIUM 1 G IJ SOLR
1.0000 g | Freq: Once | INTRAMUSCULAR | Status: AC
  Administered 2018-04-24: 1 g via INTRAMUSCULAR

## 2018-04-24 NOTE — Progress Notes (Signed)
Subjective:  By signing my name below, I, [name], attest that this documentation has been prepared under the direction and in the presence of [name], MD. Electronically Signed: [name], [place]. 04/24/2018 , 6:08 PM .  Patient was seen in Room 11 .   Patient ID: Brandon [name], male    DOB: [birth year], 77 y.o.   MRN: [id number] Chief Complaint  Patient presents with  . Abscess    Pt noticed a small bump on his left forearm, he thought it was an infected hair bump and tried to remove the hair but it has grown and become more infected. He has some pain and redness around it   HPI Brandon [name] is a 77 y.o. male  Patient is here for a bump over his left forearm. He was last treated in 2018 for abdominal wall and right elbow abscess. At that time, his wound culture was positive for staph aureus, not MRSA. He responded well to doxycycline; was sulfa resistant.   Patient states his new abscess started about a week ago. He thought it was an ingrown hair, popped the area, and pulled the hair out with tweezers. However, the area grew with some surrounding redness. He has noticed some drainage into the band-aid. He's also been applying peroxide over the area, and neosporin as well, then band-aid over to cover it. He's noticed shooting pain towards his elbow yesterday and today. He denies fever or chills.   He mentions previously being on medication for agitation. He had stopped taking it because he believes he was under control; however, his family informed him that he wasn't completely under control. He denies SI/HI.   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   Patient Active Problem List   Diagnosis Date Noted  . Upper airway cough syndrome 07/08/2017  . Dyspnea on exertion 01/04/2017  . Obesity (BMI 30-39.9) 01/04/2017  . Essential hypertension 12/05/2015  . Peripheral vascular disease, unspecified ([place]) 03/12/2014  . Atherosclerosis of native  arteries of the extremities with intermittent claudication 03/12/2014  . Physical deconditioning 09/19/2012  . Chronic obstructive pulmonary disease with bronchospasm ([place]) 09/11/2012  . AAA (abdominal aortic aneurysm, ruptured) ([place]) 09/09/2012  . Hyperglycemia 09/09/2012  . Former tobacco use 09/08/2012   Past Medical History:  Diagnosis Date  . AAA (abdominal aortic aneurysm) ([place])   . COPD (chronic obstructive pulmonary disease) ([place])   . DIC (disseminated intravascular coagulation) ([place]) 09/09/2012  . Leg wound, left 12/12/2013  . Peripheral vascular disease ([place])   . Respiratory failure, post-operative ([place]) 09/09/2012  . Shortness of breath    Past Surgical History:  Procedure Laterality Date  . ABDOMINAL AORTIC ANEURYSM REPAIR  09/08/2012   Procedure: ANEURYSM ABDOMINAL AORTIC REPAIR;  Surgeon: [name], MD;  Location: [place];  Service: Vascular;  Laterality: N/A;  open repair ruptured abdomenal aortic aneurysm  . GANGLION CYST EXCISION     Right wrist   No Known Allergies Prior to Admission medications   Medication Sig Start Date End Date Taking? Authorizing Provider  atorvastatin (LIPITOR) 10 MG tablet  04/10/17   [provider]  ibuprofen (ADVIL,MOTRIN) 200 MG tablet Take 200 mg by mouth every 6 (six) hours as needed.    [provider]  irbesartan (AVAPRO) 150 MG tablet Take 1 tablet (150 mg total) by mouth daily. 01/04/17   Tanda Rockers, MD   Social History   Socioeconomic History  . Marital status: Married  Spouse name: Not on file  . Number of children: Not on file  . Years of education: Not on file  . Highest education level: Not on file  Occupational History  . Not on file  Social Needs  . Financial resource strain: Not on file  . Food insecurity:    Worry: Not on file    Inability: Not on file  . Transportation needs:    Medical: Not on file    Non-medical: Not on file  Tobacco Use  . Smoking status: Former Smoker     Packs/day: 1.00    Years: 60.00    Pack years: 60.00    Last attempt to quit: 09/08/2012    Years since quitting: 5.6  . Smokeless tobacco: Never Used  Substance and Sexual Activity  . Alcohol use: No    Alcohol/week: 0.0 oz  . Drug use: No  . Sexual activity: Not Currently    Birth control/protection: None  Lifestyle  . Physical activity:    Days per week: Not on file    Minutes per session: Not on file  . Stress: Not on file  Relationships  . Social connections:    Talks on phone: Not on file    Gets together: Not on file    Attends religious service: Not on file    Active member of club or organization: Not on file    Attends meetings of clubs or organizations: Not on file    Relationship status: Not on file  . Intimate partner violence:    Fear of current or ex partner: Not on file    Emotionally abused: Not on file    Physically abused: Not on file    Forced sexual activity: Not on file  Other Topics Concern  . Not on file  Social History Narrative  . Not on file   Review of Systems  Constitutional: Negative for fatigue and unexpected weight change.  Eyes: Negative for visual disturbance.  Respiratory: Negative for cough, chest tightness and shortness of breath.   Cardiovascular: Negative for chest pain, palpitations and leg swelling.  Gastrointestinal: Negative for abdominal pain and blood in stool.  Musculoskeletal: Positive for myalgias (left forearm).  Skin: Positive for color change. Negative for rash.       Bump, left forearm  Neurological: Negative for dizziness, weakness, light-headedness, numbness and headaches.  Psychiatric/Behavioral: Positive for agitation. Negative for self-injury and suicidal ideas.       Objective:   Physical Exam  Constitutional: He is oriented to person, place, and time. He appears well-developed and well-nourished. No distress.  HENT:  Head: Normocephalic and atraumatic.  Eyes: Pupils are equal, round, and reactive to  light. EOM are normal.  Neck: Neck supple.  Cardiovascular: Normal rate.  Pulmonary/Chest: Effort normal. No respiratory distress.  Musculoskeletal: Normal range of motion.  Lymphadenopathy:    He has no axillary adenopathy.  Neurological: He is alert and oriented to person, place, and time.  Skin: Skin is warm and dry.  Left extensor forearm: abscess over left extensor forearm, central area elevated by 1 cm, induration measures 6 cm by 6 cm, central area with whitish-yellow discharge at the surface without apparent fluctuance, surrounding erythema at the wound measures approximately 18 cm by 8 cm, he does have some soft tissue swelling and faint erythema into the medial forearm towards the elbow; I don't see any vascular streaking past the arm; with pressure around the wound, able to express more yellow-white exudate, there are 2  openings in the wound   Psychiatric: He has a normal mood and affect. His behavior is normal.  Nursing note and vitals reviewed.   Vitals:   04/24/18 1725  BP: 122/78  Pulse: 65  Temp: (!) 97.3 F (36.3 C)  TempSrc: Oral  SpO2: 96%  Weight: 229 lb 12.8 oz (104.2 kg)  Height: 5' 11.5" (1.816 m)       Assessment & Plan:    EVERTTE SONES is a 77 y.o. male Abscess of forearm - Plan: cefTRIAXone (ROCEPHIN) injection 1 g, doxycycline (VIBRA-TABS) 100 MG tablet, WOUND CULTURE  Left arm cellulitis - Plan: cefTRIAXone (ROCEPHIN) injection 1 g, doxycycline (VIBRA-TABS) 100 MG tablet, WOUND CULTURE  Abscess of left forearm, able to express exudate from opening today with improved induration.  Does have some surrounding erythema towards the elbow consistent with cellulitis.  Denies systemic symptoms.  -Rocephin 1 g given, start doxycycline 100mg  twice daily.  Warm compresses with gentle expression of exudate at home with recheck in 24 hours to examine area of erythema and determine if incision and drainage needed at that time. RTC/ER precautions if worse.   Meds  ordered this encounter  Medications  . cefTRIAXone (ROCEPHIN) injection 1 g  . doxycycline (VIBRA-TABS) 100 MG tablet    Sig: Take 1 tablet (100 mg total) by mouth 2 (two) times daily.    Dispense:  20 tablet    Refill:  0   Patient Instructions   It appears that you do have another skin infection that has formed an abscess.  Because it is open and draining at this time, can try to initially treat without further incision.  Apply warm compresses 4-5 times per day, gently express pus after the compress.  Start antibiotic pills tonight, and you were given an injection of an antibiotic while you were in the office.  Follow-up with me tomorrow afternoon to recheck the area of redness and determine if incision/drainage is needed at that time.  Return to the clinic or go to the nearest emergency room if any of your symptoms worsen or new symptoms occur.   Skin Abscess A skin abscess is an infected area on or under your skin that contains a collection of pus and other material. An abscess may also be called a furuncle, carbuncle, or boil. An abscess can occur in or on almost any part of your body. Some abscesses break open (rupture) on their own. Most continue to get worse unless they are treated. The infection can spread deeper into the body and eventually into your blood, which can make you feel ill. Treatment usually involves draining the abscess. What are the causes? An abscess occurs when germs, often bacteria, pass through your skin and cause an infection. This may be caused by:  A scrape or cut on your skin.  A puncture wound through your skin, including a needle injection.  Blocked oil or sweat glands.  Blocked and infected hair follicles.  A cyst that forms beneath your skin (sebaceous cyst) and becomes infected.  What increases the risk? This condition is more likely to develop in people who:  Have a weak body defense system (immune system).  Have diabetes.  Have dry and  irritated skin.  Get frequent injections or use illegal IV drugs.  Have a foreign body in a wound, such as a splinter.  Have problems with their lymph system or veins.  What are the signs or symptoms? An abscess may start as a painful, firm bump  under the skin. Over time, the abscess may get larger or become softer. Pus may appear at the top of the abscess, causing pressure and pain. It may eventually break through the skin and drain. Other symptoms include:  Redness.  Warmth.  Swelling.  Tenderness.  A sore on the skin.  How is this diagnosed? This condition is diagnosed based on your medical history and a physical exam. A sample of pus may be taken from the abscess to find out what is causing the infection and what antibiotics can be used to treat it. You also may have:  Blood tests to look for signs of infection or spread of an infection to your blood.  Imaging studies such as ultrasound, CT scan, or MRI if the abscess is deep.  How is this treated? Small abscesses that drain on their own may not need treatment. Treatment for an abscess that does not rupture on its own may include:  Warm compresses applied to the area several times per day.  Incision and drainage. Your health care provider will make an incision to open the abscess and will remove pus and any foreign body or dead tissue. The incision area may be packed with gauze to keep it open for a few days while it heals.  Antibiotic medicines to treat infection. For a severe abscess, you may first get antibiotics through an IV and then change to oral antibiotics.  Follow these instructions at home: Abscess Care  If you have an abscess that has not drained, place a warm, clean, wet washcloth over the abscess several times a day. Do this as told by your health care provider.  Follow instructions from your health care provider about how to take care of your abscess. Make sure you: ? Cover the abscess with a bandage  (dressing). ? Change your dressing or gauze as told by your health care provider. ? Wash your hands with soap and water before you change the dressing or gauze. If soap and water are not available, use hand sanitizer.  Check your abscess every day for signs of a worsening infection. Check for: ? More redness, swelling, or pain. ? More fluid or blood. ? Warmth. ? More pus or a bad smell. Medicines  Take over-the-counter and prescription medicines only as told by your health care provider.  If you were prescribed an antibiotic medicine, take it as told by your health care provider. Do not stop taking the antibiotic even if you start to feel better. General instructions  To avoid spreading the infection: ? Do not share personal care items, towels, or hot tubs with others. ? Avoid making skin contact with other people.  Keep all follow-up visits as told by your health care provider. This is important. Contact a health care provider if:  You have more redness, swelling, or pain around your abscess.  You have more fluid or blood coming from your abscess.  Your abscess feels warm to the touch.  You have more pus or a bad smell coming from your abscess.  You have a fever.  You have muscle aches.  You have chills or a general ill feeling. Get help right away if:  You have severe pain.  You see red streaks on your skin spreading away from the abscess. This information is not intended to replace advice given to you by your health care provider. Make sure you discuss any questions you have with your health care provider. Document Released: 08/11/2005 Document Revised: 06/27/2016 Document Reviewed:  09/10/2015 Elsevier Interactive Patient Education  2018 [employer].    IF you received an x-ray today, you will receive an invoice from [facility]. Please contact [facility] at [phone] with questions or concerns regarding your invoice.   IF you received  labwork today, you will receive an invoice from [place]. Please contact [facility] at [phone] with questions or concerns regarding your invoice.   Our billing staff will not be able to assist you with questions regarding bills from these companies.  You will be contacted with the lab results as soon as they are available. The fastest way to get your results is to activate your My Chart account. Instructions are located on the last page of this paperwork. If you have not heard from [place] regarding the results in 2 weeks, please contact this office.       I personally performed the services described in this documentation, which was scribed in my presence. The recorded information has been reviewed and considered for accuracy and completeness, addended by me as needed, and agree with information above.  Signed,   [name], MD [facility] [place].  04/24/18 6:16 PM

## 2018-04-24 NOTE — Patient Instructions (Addendum)
It appears that you do have another skin infection that has formed an abscess.  Because it is open and draining at this time, can try to initially treat without further incision.  Apply warm compresses 4-5 times per day, gently express pus after the compress.  Start antibiotic pills tonight, and you were given an injection of an antibiotic while you were in the office.  Follow-up with me tomorrow afternoon to recheck the area of redness and determine if incision/drainage is needed at that time.  Return to the clinic or go to the nearest emergency room if any of your symptoms worsen or new symptoms occur.   Skin Abscess A skin abscess is an infected area on or under your skin that contains a collection of pus and other material. An abscess may also be called a furuncle, carbuncle, or boil. An abscess can occur in or on almost any part of your body. Some abscesses break open (rupture) on their own. Most continue to get worse unless they are treated. The infection can spread deeper into the body and eventually into your blood, which can make you feel ill. Treatment usually involves draining the abscess. What are the causes? An abscess occurs when germs, often bacteria, pass through your skin and cause an infection. This may be caused by:  A scrape or cut on your skin.  A puncture wound through your skin, including a needle injection.  Blocked oil or sweat glands.  Blocked and infected hair follicles.  A cyst that forms beneath your skin (sebaceous cyst) and becomes infected.  What increases the risk? This condition is more likely to develop in people who:  Have a weak body defense system (immune system).  Have diabetes.  Have dry and irritated skin.  Get frequent injections or use illegal IV drugs.  Have a foreign body in a wound, such as a splinter.  Have problems with their lymph system or veins.  What are the signs or symptoms? An abscess may start as a painful, firm bump under  the skin. Over time, the abscess may get larger or become softer. Pus may appear at the top of the abscess, causing pressure and pain. It may eventually break through the skin and drain. Other symptoms include:  Redness.  Warmth.  Swelling.  Tenderness.  A sore on the skin.  How is this diagnosed? This condition is diagnosed based on your medical history and a physical exam. A sample of pus may be taken from the abscess to find out what is causing the infection and what antibiotics can be used to treat it. You also may have:  Blood tests to look for signs of infection or spread of an infection to your blood.  Imaging studies such as ultrasound, CT scan, or MRI if the abscess is deep.  How is this treated? Small abscesses that drain on their own may not need treatment. Treatment for an abscess that does not rupture on its own may include:  Warm compresses applied to the area several times per day.  Incision and drainage. Your health care provider will make an incision to open the abscess and will remove pus and any foreign body or dead tissue. The incision area may be packed with gauze to keep it open for a few days while it heals.  Antibiotic medicines to treat infection. For a severe abscess, you may first get antibiotics through an IV and then change to oral antibiotics.  Follow these instructions at home: Abscess Care  If you have an abscess that has not drained, place a warm, clean, wet washcloth over the abscess several times a day. Do this as told by your health care provider.  Follow instructions from your health care provider about how to take care of your abscess. Make sure you: ? Cover the abscess with a bandage (dressing). ? Change your dressing or gauze as told by your health care provider. ? Wash your hands with soap and water before you change the dressing or gauze. If soap and water are not available, use hand sanitizer.  Check your abscess every day for signs of  a worsening infection. Check for: ? More redness, swelling, or pain. ? More fluid or blood. ? Warmth. ? More pus or a bad smell. Medicines  Take over-the-counter and prescription medicines only as told by your health care provider.  If you were prescribed an antibiotic medicine, take it as told by your health care provider. Do not stop taking the antibiotic even if you start to feel better. General instructions  To avoid spreading the infection: ? Do not share personal care items, towels, or hot tubs with others. ? Avoid making skin contact with other people.  Keep all follow-up visits as told by your health care provider. This is important. Contact a health care provider if:  You have more redness, swelling, or pain around your abscess.  You have more fluid or blood coming from your abscess.  Your abscess feels warm to the touch.  You have more pus or a bad smell coming from your abscess.  You have a fever.  You have muscle aches.  You have chills or a general ill feeling. Get help right away if:  You have severe pain.  You see red streaks on your skin spreading away from the abscess. This information is not intended to replace advice given to you by your health care provider. Make sure you discuss any questions you have with your health care provider. Document Released: 08/11/2005 Document Revised: 06/27/2016 Document Reviewed: 09/10/2015 Elsevier Interactive Patient Education  2018 [employer].    IF you received an x-ray today, you will receive an invoice from [facility]. Please contact [facility] at [phone] with questions or concerns regarding your invoice.   IF you received labwork today, you will receive an invoice from [place]. Please contact [facility] at [phone] with questions or concerns regarding your invoice.   Our billing staff will not be able to assist you with questions regarding bills from these companies.  You will be  contacted with the lab results as soon as they are available. The fastest way to get your results is to activate your My Chart account. Instructions are located on the last page of this paperwork. If you have not heard from [place] regarding the results in 2 weeks, please contact this office.

## 2018-04-25 ENCOUNTER — Ambulatory Visit (INDEPENDENT_AMBULATORY_CARE_PROVIDER_SITE_OTHER): Payer: Medicare HMO | Admitting: Family Medicine

## 2018-04-25 ENCOUNTER — Encounter: Payer: Self-pay | Admitting: Family Medicine

## 2018-04-25 VITALS — BP 122/60 | HR 66 | Temp 98.3°F | Ht 71.0 in | Wt 231.4 lb

## 2018-04-25 DIAGNOSIS — L02414 Cutaneous abscess of left upper limb: Secondary | ICD-10-CM | POA: Diagnosis not present

## 2018-04-25 MED ORDER — CEFTRIAXONE SODIUM 1 G IJ SOLR
1.0000 g | Freq: Once | INTRAMUSCULAR | Status: AC
  Administered 2018-04-25: 1 g via INTRAMUSCULAR

## 2018-04-25 NOTE — Progress Notes (Signed)
Subjective:  By signing my name below, I, [name], attest that this documentation has been prepared under the direction and in the presence of [name], MD. Electronically Signed: [name], [place]. 04/25/2018 , 5:51 PM .  Patient was seen in Room 9 .   Patient ID: Brandon [name], male    DOB: 25-Mar-1941, 77 y.o.   MRN: [id number] Chief Complaint  Patient presents with  . Wound Check    1 day follow up    HPI Brandon [name] is a 77 y.o. male  Patient is here to recheck abscess located at his left forearm. He received Rocephin 1 g yesterday, and started on doxycycline 100 mg bid. Wound culture obtained. There was an opening already, and exudate was expressed without difficulty yesterday. Recommended warm compresses. Erythema was outlined.   Patient states he was able to express more drainage last night with warm compresses. He has only applied warm compress once last night. He was out working today so he wasn't able to apply warm compresses today. He describes area of redness has decreased. He also mentions unable to receive oral antibiotics yesterday because the pharmacy was closed. He picked them up today, but he hasn't started it yet.   Patient Active Problem List   Diagnosis Date Noted  . Upper airway cough syndrome 07/08/2017  . Dyspnea on exertion 01/04/2017  . Obesity (BMI 30-39.9) 01/04/2017  . Essential hypertension 12/05/2015  . Peripheral vascular disease, unspecified ([place]) 03/12/2014  . Atherosclerosis of native arteries of the extremities with intermittent claudication 03/12/2014  . Physical deconditioning 09/19/2012  . Chronic obstructive pulmonary disease with bronchospasm ([place]) 09/11/2012  . AAA (abdominal aortic aneurysm, ruptured) ([place]) 09/09/2012  . Hyperglycemia 09/09/2012  . Former tobacco use 09/08/2012   Past Medical History:  Diagnosis Date  . AAA (abdominal aortic aneurysm) ([place])   . COPD (chronic obstructive pulmonary disease) ([place])     . DIC (disseminated intravascular coagulation) ([place]) 09/09/2012  . Leg wound, left 12/12/2013  . Peripheral vascular disease ([place])   . Respiratory failure, post-operative ([place]) 09/09/2012  . Shortness of breath    Past Surgical History:  Procedure Laterality Date  . ABDOMINAL AORTIC ANEURYSM REPAIR  09/08/2012   Procedure: ANEURYSM ABDOMINAL AORTIC REPAIR;  Surgeon: [name], MD;  Location: [facility];  Service: Vascular;  Laterality: N/A;  open repair ruptured abdomenal aortic aneurysm  . GANGLION CYST EXCISION     Right wrist   No Known Allergies Prior to Admission medications   Medication Sig Start Date End Date Taking? Authorizing Provider  atorvastatin (LIPITOR) 10 MG tablet  04/10/17   [provider]  doxycycline (VIBRA-TABS) 100 MG tablet Take 1 tablet (100 mg total) by mouth 2 (two) times daily. 04/24/18   [name], MD  ibuprofen (ADVIL,MOTRIN) 200 MG tablet Take 200 mg by mouth every 6 (six) hours as needed.    [provider]  irbesartan (AVAPRO) 150 MG tablet Take 1 tablet (150 mg total) by mouth daily. 01/04/17   Tanda Rockers, MD  Lansoprazole (PREVACID PO) Take by mouth daily.    [provider]   Social History   Socioeconomic History  . Marital status: Married    Spouse name: Not on file  . Number of children: Not on file  . Years of education: Not on file  . Highest education level: Not on file  Occupational History  . Not on file  Social Needs  . Financial resource strain: Not  on file  . Food insecurity:    Worry: Not on file    Inability: Not on file  . Transportation needs:    Medical: Not on file    Non-medical: Not on file  Tobacco Use  . Smoking status: Former Smoker    Packs/day: 1.00    Years: 60.00    Pack years: 60.00    Last attempt to quit: 09/08/2012    Years since quitting: 5.6  . Smokeless tobacco: Never Used  Substance and Sexual Activity  . Alcohol use: No    Alcohol/week: 0.0 oz  . Drug use: No   . Sexual activity: Not Currently    Birth control/protection: None  Lifestyle  . Physical activity:    Days per week: Not on file    Minutes per session: Not on file  . Stress: Not on file  Relationships  . Social connections:    Talks on phone: Not on file    Gets together: Not on file    Attends religious service: Not on file    Active member of club or organization: Not on file    Attends meetings of clubs or organizations: Not on file    Relationship status: Not on file  . Intimate partner violence:    Fear of current or ex partner: Not on file    Emotionally abused: Not on file    Physically abused: Not on file    Forced sexual activity: Not on file  Other Topics Concern  . Not on file  Social History Narrative  . Not on file   Review of Systems  Constitutional: Negative for fatigue and unexpected weight change.  Eyes: Negative for visual disturbance.  Respiratory: Negative for cough, chest tightness and shortness of breath.   Cardiovascular: Negative for chest pain, palpitations and leg swelling.  Gastrointestinal: Negative for abdominal pain and blood in stool.  Skin: Positive for wound.  Neurological: Negative for dizziness, light-headedness and headaches.       Objective:   Physical Exam  Constitutional: He is oriented to person, place, and time. He appears well-developed and well-nourished. No distress.  HENT:  Head: Normocephalic and atraumatic.  Eyes: Pupils are equal, round, and reactive to light. EOM are normal.  Neck: Neck supple.  Cardiovascular: Normal rate.  Pulmonary/Chest: Effort normal. No respiratory distress.  Musculoskeletal: Normal range of motion.  Neurological: He is alert and oriented to person, place, and time.  Skin: Skin is warm and dry.  Left forearm: initially removed coban that appeared fairly tight around forearm with residual tightness in skin, marked swelling with increased swelling, swelling into left wrist and appears to have  marked swelling into left forearm and elbow area; previous outlining has mostly been cleared/wiped off, and there is barely any outline remaining; some recession of erythema distally, similar proximally without vascular streaking of his upper arm; still able to express some exudate from the wound on his forearm which is white-yellow exudate with a small amount of blood  Psychiatric: He has a normal mood and affect. His behavior is normal.  Nursing note and vitals reviewed.   Vitals:   04/25/18 1706  BP: 122/60  Pulse: 66  Temp: 98.3 F (36.8 C)  TempSrc: Oral  SpO2: 97%  Weight: 231 lb 6.4 oz (105 kg)  Height: '5\' 11"'$  (1.803 m)       Assessment & Plan:    Brandon [name] is a 77 y.o. male Abscess of left forearm - Plan: cefTRIAXone (ROCEPHIN)  injection 1 g  -Overall stable, do not appreciate significant spread of erythema.  Unfortunately had not yet started oral antibiotics.  Rocephin 1 g was given again to allow time for him to start on antibiotics.  Recommended increase timing of warm compresses or soaks with gentle pressure, after soap and water cleansing, recheck in 2 days.  Sooner if worse  Meds ordered this encounter  Medications  . cefTRIAXone (ROCEPHIN) injection 1 g   Patient Instructions   Warm compresses with gentle pressure as discussed 4 times per day. Start doxycycline as soon as possible. Recheck with me Thursday. Return to the clinic or go to the nearest emergency room if any of your symptoms worsen or new symptoms occur.   Skin Abscess A skin abscess is an infected area on or under your skin that contains a collection of pus and other material. An abscess may also be called a furuncle, carbuncle, or boil. An abscess can occur in or on almost any part of your body. Some abscesses break open (rupture) on their own. Most continue to get worse unless they are treated. The infection can spread deeper into the body and eventually into your blood, which can make you feel  ill. Treatment usually involves draining the abscess. What are the causes? An abscess occurs when germs, often bacteria, pass through your skin and cause an infection. This may be caused by:  A scrape or cut on your skin.  A puncture wound through your skin, including a needle injection.  Blocked oil or sweat glands.  Blocked and infected hair follicles.  A cyst that forms beneath your skin (sebaceous cyst) and becomes infected.  What increases the risk? This condition is more likely to develop in people who:  Have a weak body defense system (immune system).  Have diabetes.  Have dry and irritated skin.  Get frequent injections or use illegal IV drugs.  Have a foreign body in a wound, such as a splinter.  Have problems with their lymph system or veins.  What are the signs or symptoms? An abscess may start as a painful, firm bump under the skin. Over time, the abscess may get larger or become softer. Pus may appear at the top of the abscess, causing pressure and pain. It may eventually break through the skin and drain. Other symptoms include:  Redness.  Warmth.  Swelling.  Tenderness.  A sore on the skin.  How is this diagnosed? This condition is diagnosed based on your medical history and a physical exam. A sample of pus may be taken from the abscess to find out what is causing the infection and what antibiotics can be used to treat it. You also may have:  Blood tests to look for signs of infection or spread of an infection to your blood.  Imaging studies such as ultrasound, CT scan, or MRI if the abscess is deep.  How is this treated? Small abscesses that drain on their own may not need treatment. Treatment for an abscess that does not rupture on its own may include:  Warm compresses applied to the area several times per day.  Incision and drainage. Your health care provider will make an incision to open the abscess and will remove pus and any foreign body or  dead tissue. The incision area may be packed with gauze to keep it open for a few days while it heals.  Antibiotic medicines to treat infection. For a severe abscess, you may first get antibiotics through an  IV and then change to oral antibiotics.  Follow these instructions at home: Abscess Care  If you have an abscess that has not drained, place a warm, clean, wet washcloth over the abscess several times a day. Do this as told by your health care provider.  Follow instructions from your health care provider about how to take care of your abscess. Make sure you: ? Cover the abscess with a bandage (dressing). ? Change your dressing or gauze as told by your health care provider. ? Wash your hands with soap and water before you change the dressing or gauze. If soap and water are not available, use hand sanitizer.  Check your abscess every day for signs of a worsening infection. Check for: ? More redness, swelling, or pain. ? More fluid or blood. ? Warmth. ? More pus or a bad smell. Medicines  Take over-the-counter and prescription medicines only as told by your health care provider.  If you were prescribed an antibiotic medicine, take it as told by your health care provider. Do not stop taking the antibiotic even if you start to feel better. General instructions  To avoid spreading the infection: ? Do not share personal care items, towels, or hot tubs with others. ? Avoid making skin contact with other people.  Keep all follow-up visits as told by your health care provider. This is important. Contact a health care provider if:  You have more redness, swelling, or pain around your abscess.  You have more fluid or blood coming from your abscess.  Your abscess feels warm to the touch.  You have more pus or a bad smell coming from your abscess.  You have a fever.  You have muscle aches.  You have chills or a general ill feeling. Get help right away if:  You have severe  pain.  You see red streaks on your skin spreading away from the abscess. This information is not intended to replace advice given to you by your health care provider. Make sure you discuss any questions you have with your health care provider. Document Released: 08/11/2005 Document Revised: 06/27/2016 Document Reviewed: 09/10/2015 Elsevier Interactive Patient Education  2018 [employer].    IF you received an x-ray today, you will receive an invoice from [facility]. Please contact [facility] at [phone] with questions or concerns regarding your invoice.   IF you received labwork today, you will receive an invoice from [facility]. Please contact [facility] at [phone] with questions or concerns regarding your invoice.   Our billing staff will not be able to assist you with questions regarding bills from these companies.  You will be contacted with the lab results as soon as they are available. The fastest way to get your results is to activate your My Chart account. Instructions are located on the last page of this paperwork. If you have not heard from [place] regarding the results in 2 weeks, please contact this office.       I personally performed the services described in this documentation, which was scribed in my presence. The recorded information has been reviewed and considered for accuracy and completeness, addended by me as needed, and agree with information above.  Signed,   [name], MD [facility].  04/29/18 11:13 PM

## 2018-04-25 NOTE — Patient Instructions (Addendum)
Warm compresses with gentle pressure as discussed 4 times per day. Start doxycycline as soon as possible. Recheck with me Thursday. Return to the clinic or go to the nearest emergency room if any of your symptoms worsen or new symptoms occur.   Skin Abscess A skin abscess is an infected area on or under your skin that contains a collection of pus and other material. An abscess may also be called a furuncle, carbuncle, or boil. An abscess can occur in or on almost any part of your body. Some abscesses break open (rupture) on their own. Most continue to get worse unless they are treated. The infection can spread deeper into the body and eventually into your blood, which can make you feel ill. Treatment usually involves draining the abscess. What are the causes? An abscess occurs when germs, often bacteria, pass through your skin and cause an infection. This may be caused by:  A scrape or cut on your skin.  A puncture wound through your skin, including a needle injection.  Blocked oil or sweat glands.  Blocked and infected hair follicles.  A cyst that forms beneath your skin (sebaceous cyst) and becomes infected.  What increases the risk? This condition is more likely to develop in people who:  Have a weak body defense system (immune system).  Have diabetes.  Have dry and irritated skin.  Get frequent injections or use illegal IV drugs.  Have a foreign body in a wound, such as a splinter.  Have problems with their lymph system or veins.  What are the signs or symptoms? An abscess may start as a painful, firm bump under the skin. Over time, the abscess may get larger or become softer. Pus may appear at the top of the abscess, causing pressure and pain. It may eventually break through the skin and drain. Other symptoms include:  Redness.  Warmth.  Swelling.  Tenderness.  A sore on the skin.  How is this diagnosed? This condition is diagnosed based on your medical history  and a physical exam. A sample of pus may be taken from the abscess to find out what is causing the infection and what antibiotics can be used to treat it. You also may have:  Blood tests to look for signs of infection or spread of an infection to your blood.  Imaging studies such as ultrasound, CT scan, or MRI if the abscess is deep.  How is this treated? Small abscesses that drain on their own may not need treatment. Treatment for an abscess that does not rupture on its own may include:  Warm compresses applied to the area several times per day.  Incision and drainage. Your health care provider will make an incision to open the abscess and will remove pus and any foreign body or dead tissue. The incision area may be packed with gauze to keep it open for a few days while it heals.  Antibiotic medicines to treat infection. For a severe abscess, you may first get antibiotics through an IV and then change to oral antibiotics.  Follow these instructions at home: Abscess Care  If you have an abscess that has not drained, place a warm, clean, wet washcloth over the abscess several times a day. Do this as told by your health care provider.  Follow instructions from your health care provider about how to take care of your abscess. Make sure you: ? Cover the abscess with a bandage (dressing). ? Change your dressing or gauze as told by  your health care provider. ? Wash your hands with soap and water before you change the dressing or gauze. If soap and water are not available, use hand sanitizer.  Check your abscess every day for signs of a worsening infection. Check for: ? More redness, swelling, or pain. ? More fluid or blood. ? Warmth. ? More pus or a bad smell. Medicines  Take over-the-counter and prescription medicines only as told by your health care provider.  If you were prescribed an antibiotic medicine, take it as told by your health care provider. Do not stop taking the antibiotic  even if you start to feel better. General instructions  To avoid spreading the infection: ? Do not share personal care items, towels, or hot tubs with others. ? Avoid making skin contact with other people.  Keep all follow-up visits as told by your health care provider. This is important. Contact a health care provider if:  You have more redness, swelling, or pain around your abscess.  You have more fluid or blood coming from your abscess.  Your abscess feels warm to the touch.  You have more pus or a bad smell coming from your abscess.  You have a fever.  You have muscle aches.  You have chills or a general ill feeling. Get help right away if:  You have severe pain.  You see red streaks on your skin spreading away from the abscess. This information is not intended to replace advice given to you by your health care provider. Make sure you discuss any questions you have with your health care provider. Document Released: 08/11/2005 Document Revised: 06/27/2016 Document Reviewed: 09/10/2015 Elsevier Interactive Patient Education  2018 [employer].    IF you received an x-ray today, you will receive an invoice from [facility]. Please contact [facility] at [phone] with questions or concerns regarding your invoice.   IF you received labwork today, you will receive an invoice from [facility]. Please contact [facility] at [phone] with questions or concerns regarding your invoice.   Our billing staff will not be able to assist you with questions regarding bills from these companies.  You will be contacted with the lab results as soon as they are available. The fastest way to get your results is to activate your My Chart account. Instructions are located on the last page of this paperwork. If you have not heard from [place] regarding the results in 2 weeks, please contact this office.

## 2018-04-26 LAB — WOUND CULTURE

## 2018-04-27 ENCOUNTER — Other Ambulatory Visit: Payer: Self-pay

## 2018-04-27 ENCOUNTER — Ambulatory Visit (INDEPENDENT_AMBULATORY_CARE_PROVIDER_SITE_OTHER): Payer: Medicare HMO | Admitting: Family Medicine

## 2018-04-27 ENCOUNTER — Encounter: Payer: Self-pay | Admitting: Family Medicine

## 2018-04-27 VITALS — BP 128/78 | HR 67 | Temp 97.3°F | Resp 16 | Ht 71.0 in | Wt 233.0 lb

## 2018-04-27 DIAGNOSIS — L02414 Cutaneous abscess of left upper limb: Secondary | ICD-10-CM | POA: Diagnosis not present

## 2018-04-27 NOTE — Progress Notes (Signed)
Subjective:  By signing my name below, I, [name], attest that this documentation has been prepared under the direction and in the presence of Brandon Agreste, MD. Electronically Signed: [name], Medical Scribe 04/27/18 at 5:59 PM.   Patient ID: Brandon [name], male    DOB: Dec 05, 1940, 77 y.o.   MRN: [id number]  Chief Complaint  Patient presents with  . Wound Check    left forearm   HPI Brandon [name] is a 77 y.o. male who presents to Primary Care at [facility] for a wound check of his left forearm. See previous note. He was given two doses of Rocephin and started on doxycycline at that time. He took his first dose yesterday morning, 04/26/2018. He is status post three doses. Brandon [name] has been soaking the wound as advised since Tuesday, 04/25/2018. He reports his pain resolved after the abscess started draining. The patient denies pain and fever.   Wound culture: Staph sensitive to doxy  Patient Active Problem List   Diagnosis Date Noted  . Upper airway cough syndrome 07/08/2017  . Dyspnea on exertion 01/04/2017  . Obesity (BMI 30-39.9) 01/04/2017  . Essential hypertension 12/05/2015  . Peripheral vascular disease, unspecified ([place]) 03/12/2014  . Atherosclerosis of native arteries of the extremities with intermittent claudication 03/12/2014  . Physical deconditioning 09/19/2012  . Chronic obstructive pulmonary disease with bronchospasm ([place]) 09/11/2012  . AAA (abdominal aortic aneurysm, ruptured) ([place]) 09/09/2012  . Hyperglycemia 09/09/2012  . Former tobacco use 09/08/2012   Past Medical History:  Diagnosis Date  . AAA (abdominal aortic aneurysm) ([place])   . COPD (chronic obstructive pulmonary disease) ([place])   . DIC (disseminated intravascular coagulation) ([place]) 09/09/2012  . Leg wound, left 12/12/2013  . Peripheral vascular disease ([place])   . Respiratory failure, post-operative ([place]) 09/09/2012  . Shortness of breath    Past Surgical History:  Procedure Laterality  Date  . ABDOMINAL AORTIC ANEURYSM REPAIR  09/08/2012   Procedure: ANEURYSM ABDOMINAL AORTIC REPAIR;  Surgeon: [name], MD;  Location: [facility];  Service: Vascular;  Laterality: N/A;  open repair ruptured abdomenal aortic aneurysm  . GANGLION CYST EXCISION     Right wrist   No Known Allergies Prior to Admission medications   Medication Sig Start Date End Date Taking? Authorizing Provider  atorvastatin (LIPITOR) 10 MG tablet  04/10/17   [provider]  doxycycline (VIBRA-TABS) 100 MG tablet Take 1 tablet (100 mg total) by mouth 2 (two) times daily. 04/24/18   Brandon Agreste, MD  ibuprofen (ADVIL,MOTRIN) 200 MG tablet Take 200 mg by mouth every 6 (six) hours as needed.    [provider]  irbesartan (AVAPRO) 150 MG tablet Take 1 tablet (150 mg total) by mouth daily. 01/04/17   Tanda Rockers, MD  Lansoprazole (PREVACID PO) Take by mouth daily.    [provider]   Social History   Socioeconomic History  . Marital status: Married    Spouse name: Not on file  . Number of children: Not on file  . Years of education: Not on file  . Highest education level: Not on file  Occupational History  . Not on file  Social Needs  . Financial resource strain: Not on file  . Food insecurity:    Worry: Not on file    Inability: Not on file  . Transportation needs:    Medical: Not on file    Non-medical: Not on file  Tobacco Use  . Smoking status: Former Smoker  Packs/day: 1.00    Years: 60.00    Pack years: 60.00    Last attempt to quit: 09/08/2012    Years since quitting: 5.6  . Smokeless tobacco: Never Used  Substance and Sexual Activity  . Alcohol use: No    Alcohol/week: 0.0 oz  . Drug use: No  . Sexual activity: Not Currently    Birth control/protection: None  Lifestyle  . Physical activity:    Days per week: Not on file    Minutes per session: Not on file  . Stress: Not on file  Relationships  . Social connections:    Talks on phone: Not on  file    Gets together: Not on file    Attends religious service: Not on file    Active member of club or organization: Not on file    Attends meetings of clubs or organizations: Not on file    Relationship status: Not on file  . Intimate partner violence:    Fear of current or ex partner: Not on file    Emotionally abused: Not on file    Physically abused: Not on file    Forced sexual activity: Not on file  Other Topics Concern  . Not on file  Social History Narrative  . Not on file    Review of Systems  Constitutional: Negative for fever.  Skin: Positive for wound.  All other systems reviewed and are negative.      Objective:   Physical Exam  Constitutional: He is oriented to person, place, and time. He appears well-developed and well-nourished. No distress.  HENT:  Head: Normocephalic and atraumatic.  Cardiovascular: Normal rate.  Pulmonary/Chest: Effort normal.  Musculoskeletal:  Faint erythema extending from medial elbow to proximal forearm near wound on the extensive surface. Minimal induration. Surface wound with small amount of yellow discharge at surface only. Pain free ROM in left elbow.   Neurological: He is alert and oriented to person, place, and time.  Psychiatric: He has a normal mood and affect.     Vitals:   04/27/18 1731  BP: 128/78  Pulse: 67  Resp: 16  Temp: (!) 97.3 F (36.3 C)  TempSrc: Oral  SpO2: 99%  Weight: 233 lb (105.7 kg)  Height: '5\' 11"'$  (1.803 m)   Results for orders placed or performed in visit on 04/24/18  WOUND CULTURE  Result Value Ref Range   Gram Stain Result Final report    Organism ID, Bacteria Comment    Organism ID, Bacteria Comment    Organism ID, Bacteria Comment    Aerobic Bacterial Culture Final report (A)    Organism ID, Bacteria Staphylococcus aureus (A)    Antimicrobial Susceptibility Comment        Assessment & Plan:    [name] is a 77 y.o. male Abscess of left forearm  -Appears to be improving  with home treatment as well as doxycycline.  Currently tolerating the antibiotic.  Wound culture was reviewed, with sensitivity to doxycycline.  Continue home treatment with warm soaks or compresses, gentle pressure for express exudate, recheck in 4 days.  If worsening symptoms during that time, return sooner.  No orders of the defined types were placed in this encounter.  Patient Instructions   Arm infection appears to be getting better.  Continue the antibiotic twice per day, warm soaks or hot compresses 3-4 times per day, keep area clean with soap and water and covered when it may be exposed to dirt.  Recheck in  4 days, sooner if any increasing redness swelling or worsening symptoms.  Thank you for coming in today.   Skin Abscess A skin abscess is an infected area on or under your skin that contains a collection of pus and other material. An abscess may also be called a furuncle, carbuncle, or boil. An abscess can occur in or on almost any part of your body. Some abscesses break open (rupture) on their own. Most continue to get worse unless they are treated. The infection can spread deeper into the body and eventually into your blood, which can make you feel ill. Treatment usually involves draining the abscess. What are the causes? An abscess occurs when germs, often bacteria, pass through your skin and cause an infection. This may be caused by:  A scrape or cut on your skin.  A puncture wound through your skin, including a needle injection.  Blocked oil or sweat glands.  Blocked and infected hair follicles.  A cyst that forms beneath your skin (sebaceous cyst) and becomes infected.  What increases the risk? This condition is more likely to develop in people who:  Have a weak body defense system (immune system).  Have diabetes.  Have dry and irritated skin.  Get frequent injections or use illegal IV drugs.  Have a foreign body in a wound, such as a splinter.  Have problems  with their lymph system or veins.  What are the signs or symptoms? An abscess may start as a painful, firm bump under the skin. Over time, the abscess may get larger or become softer. Pus may appear at the top of the abscess, causing pressure and pain. It may eventually break through the skin and drain. Other symptoms include:  Redness.  Warmth.  Swelling.  Tenderness.  A sore on the skin.  How is this diagnosed? This condition is diagnosed based on your medical history and a physical exam. A sample of pus may be taken from the abscess to find out what is causing the infection and what antibiotics can be used to treat it. You also may have:  Blood tests to look for signs of infection or spread of an infection to your blood.  Imaging studies such as ultrasound, CT scan, or MRI if the abscess is deep.  How is this treated? Small abscesses that drain on their own may not need treatment. Treatment for an abscess that does not rupture on its own may include:  Warm compresses applied to the area several times per day.  Incision and drainage. Your health care provider will make an incision to open the abscess and will remove pus and any foreign body or dead tissue. The incision area may be packed with gauze to keep it open for a few days while it heals.  Antibiotic medicines to treat infection. For a severe abscess, you may first get antibiotics through an IV and then change to oral antibiotics.  Follow these instructions at home: Abscess Care  If you have an abscess that has not drained, place a warm, clean, wet washcloth over the abscess several times a day. Do this as told by your health care provider.  Follow instructions from your health care provider about how to take care of your abscess. Make sure you: ? Cover the abscess with a bandage (dressing). ? Change your dressing or gauze as told by your health care provider. ? Wash your hands with soap and water before you change the  dressing or gauze. If soap and water  are not available, use hand sanitizer.  Check your abscess every day for signs of a worsening infection. Check for: ? More redness, swelling, or pain. ? More fluid or blood. ? Warmth. ? More pus or a bad smell. Medicines  Take over-the-counter and prescription medicines only as told by your health care provider.  If you were prescribed an antibiotic medicine, take it as told by your health care provider. Do not stop taking the antibiotic even if you start to feel better. General instructions  To avoid spreading the infection: ? Do not share personal care items, towels, or hot tubs with others. ? Avoid making skin contact with other people.  Keep all follow-up visits as told by your health care provider. This is important. Contact a health care provider if:  You have more redness, swelling, or pain around your abscess.  You have more fluid or blood coming from your abscess.  Your abscess feels warm to the touch.  You have more pus or a bad smell coming from your abscess.  You have a fever.  You have muscle aches.  You have chills or a general ill feeling. Get help right away if:  You have severe pain.  You see red streaks on your skin spreading away from the abscess. This information is not intended to replace advice given to you by your health care provider. Make sure you discuss any questions you have with your health care provider. Document Released: 08/11/2005 Document Revised: 06/27/2016 Document Reviewed: 09/10/2015 Elsevier Interactive Patient Education  2018 [employer].    IF you received an x-ray today, you will receive an invoice from [facility]. Please contact [facility] at [phone] with questions or concerns regarding your invoice.   IF you received labwork today, you will receive an invoice from [place]. Please contact [facility] at [phone] with questions or concerns regarding your invoice.     Our billing staff will not be able to assist you with questions regarding bills from these companies.  You will be contacted with the lab results as soon as they are available. The fastest way to get your results is to activate your My Chart account. Instructions are located on the last page of this paperwork. If you have not heard from [place] regarding the results in 2 weeks, please contact this office.       I personally performed the services described in this documentation, which was scribed in my presence. The recorded information has been reviewed and considered for accuracy and completeness, addended by me as needed, and agree with information above.  Signed,   [name], MD Primary Care at [facility].  04/29/18 11:17 PM

## 2018-04-27 NOTE — Patient Instructions (Addendum)
Arm infection appears to be getting better.  Continue the antibiotic twice per day, warm soaks or hot compresses 3-4 times per day, keep area clean with soap and water and covered when it may be exposed to dirt.  Recheck in 4 days, sooner if any increasing redness swelling or worsening symptoms.  Thank you for coming in today.   Skin Abscess A skin abscess is an infected area on or under your skin that contains a collection of pus and other material. An abscess may also be called a furuncle, carbuncle, or boil. An abscess can occur in or on almost any part of your body. Some abscesses break open (rupture) on their own. Most continue to get worse unless they are treated. The infection can spread deeper into the body and eventually into your blood, which can make you feel ill. Treatment usually involves draining the abscess. What are the causes? An abscess occurs when germs, often bacteria, pass through your skin and cause an infection. This may be caused by:  A scrape or cut on your skin.  A puncture wound through your skin, including a needle injection.  Blocked oil or sweat glands.  Blocked and infected hair follicles.  A cyst that forms beneath your skin (sebaceous cyst) and becomes infected.  What increases the risk? This condition is more likely to develop in people who:  Have a weak body defense system (immune system).  Have diabetes.  Have dry and irritated skin.  Get frequent injections or use illegal IV drugs.  Have a foreign body in a wound, such as a splinter.  Have problems with their lymph system or veins.  What are the signs or symptoms? An abscess may start as a painful, firm bump under the skin. Over time, the abscess may get larger or become softer. Pus may appear at the top of the abscess, causing pressure and pain. It may eventually break through the skin and drain. Other symptoms include:  Redness.  Warmth.  Swelling.  Tenderness.  A sore on the  skin.  How is this diagnosed? This condition is diagnosed based on your medical history and a physical exam. A sample of pus may be taken from the abscess to find out what is causing the infection and what antibiotics can be used to treat it. You also may have:  Blood tests to look for signs of infection or spread of an infection to your blood.  Imaging studies such as ultrasound, CT scan, or MRI if the abscess is deep.  How is this treated? Small abscesses that drain on their own may not need treatment. Treatment for an abscess that does not rupture on its own may include:  Warm compresses applied to the area several times per day.  Incision and drainage. Your health care provider will make an incision to open the abscess and will remove pus and any foreign body or dead tissue. The incision area may be packed with gauze to keep it open for a few days while it heals.  Antibiotic medicines to treat infection. For a severe abscess, you may first get antibiotics through an IV and then change to oral antibiotics.  Follow these instructions at home: Abscess Care  If you have an abscess that has not drained, place a warm, clean, wet washcloth over the abscess several times a day. Do this as told by your health care provider.  Follow instructions from your health care provider about how to take care of your abscess. Make sure  you: ? Cover the abscess with a bandage (dressing). ? Change your dressing or gauze as told by your health care provider. ? Wash your hands with soap and water before you change the dressing or gauze. If soap and water are not available, use hand sanitizer.  Check your abscess every day for signs of a worsening infection. Check for: ? More redness, swelling, or pain. ? More fluid or blood. ? Warmth. ? More pus or a bad smell. Medicines  Take over-the-counter and prescription medicines only as told by your health care provider.  If you were prescribed an antibiotic  medicine, take it as told by your health care provider. Do not stop taking the antibiotic even if you start to feel better. General instructions  To avoid spreading the infection: ? Do not share personal care items, towels, or hot tubs with others. ? Avoid making skin contact with other people.  Keep all follow-up visits as told by your health care provider. This is important. Contact a health care provider if:  You have more redness, swelling, or pain around your abscess.  You have more fluid or blood coming from your abscess.  Your abscess feels warm to the touch.  You have more pus or a bad smell coming from your abscess.  You have a fever.  You have muscle aches.  You have chills or a general ill feeling. Get help right away if:  You have severe pain.  You see red streaks on your skin spreading away from the abscess. This information is not intended to replace advice given to you by your health care provider. Make sure you discuss any questions you have with your health care provider. Document Released: 08/11/2005 Document Revised: 06/27/2016 Document Reviewed: 09/10/2015 Elsevier Interactive Patient Education  2018 [employer].    IF you received an x-ray today, you will receive an invoice from [facility]. Please contact [facility] at [phone] with questions or concerns regarding your invoice.   IF you received labwork today, you will receive an invoice from [place]. Please contact [facility] at [phone] with questions or concerns regarding your invoice.   Our billing staff will not be able to assist you with questions regarding bills from these companies.  You will be contacted with the lab results as soon as they are available. The fastest way to get your results is to activate your My Chart account. Instructions are located on the last page of this paperwork. If you have not heard from [place] regarding the results in 2 weeks, please contact  this office.

## 2018-04-29 ENCOUNTER — Encounter: Payer: Self-pay | Admitting: Family Medicine

## 2018-05-01 ENCOUNTER — Encounter: Payer: Self-pay | Admitting: Family Medicine

## 2018-05-01 ENCOUNTER — Ambulatory Visit (INDEPENDENT_AMBULATORY_CARE_PROVIDER_SITE_OTHER): Payer: Medicare HMO | Admitting: Family Medicine

## 2018-05-01 VITALS — BP 112/71 | HR 75 | Temp 98.3°F | Ht 71.0 in | Wt 228.8 lb

## 2018-05-01 DIAGNOSIS — R454 Irritability and anger: Secondary | ICD-10-CM | POA: Diagnosis not present

## 2018-05-01 DIAGNOSIS — L03114 Cellulitis of left upper limb: Secondary | ICD-10-CM | POA: Diagnosis not present

## 2018-05-01 DIAGNOSIS — L02419 Cutaneous abscess of limb, unspecified: Secondary | ICD-10-CM

## 2018-05-01 MED ORDER — CITALOPRAM HYDROBROMIDE 20 MG PO TABS: 20.0000 mg | ORAL_TABLET | Freq: Every day | ORAL | 1 refills | Status: DC

## 2018-05-01 NOTE — Progress Notes (Signed)
Subjective:  By signing my name below, I, Brandon [name], attest that this documentation has been prepared under the direction and in the presence of [name], MD. Electronically Signed: [name], [place]. 05/01/2018 , 5:59 PM .  Patient was seen in Room 8 .   Patient ID: Brandon [name], male    DOB: December 31, 1940, 77 y.o.   MRN: [id number] Chief Complaint  Patient presents with  . Wound Check    4 day check    HPI Brandon [name] is a 77 y.o. male  Here for follow up of left elbow abscess, see previous visits. He had continued to improve with soaks, although infrequent. He was tolerating doxycycline 4 days ago; started on June 12th.   Patient states he was able to get some fluid drainage out this morning; denies any pus drained this morning. He's been applying warm wash rag about 2-3 times a day. He applied a warm wash rag this morning.   Aggravated Patient describes some irritability and on edge, having "road rage". He noticed becoming upset and aggravated about 6 months ago. He denies feeling depressed. He had taken Lexapro in the past, but he's been off of it for a while now. He states he was doing well on it initially, but loss effectiveness gradually. He denies having side effects with it. He denies SI/HI. He denies drinking alcohol. He's quit smoking.   Patient Active Problem List   Diagnosis Date Noted  . Upper airway cough syndrome 07/08/2017  . Dyspnea on exertion 01/04/2017  . Obesity (BMI 30-39.9) 01/04/2017  . Essential hypertension 12/05/2015  . Peripheral vascular disease, unspecified ([place]) 03/12/2014  . Atherosclerosis of native arteries of the extremities with intermittent claudication 03/12/2014  . Physical deconditioning 09/19/2012  . Chronic obstructive pulmonary disease with bronchospasm ([place]) 09/11/2012  . AAA (abdominal aortic aneurysm, ruptured) ([place]) 09/09/2012  . Hyperglycemia 09/09/2012  . Former tobacco use 09/08/2012   Past Medical History:    Diagnosis Date  . AAA (abdominal aortic aneurysm) ([place])   . COPD (chronic obstructive pulmonary disease) ([place])   . DIC (disseminated intravascular coagulation) ([place]) 09/09/2012  . Leg wound, left 12/12/2013  . Peripheral vascular disease ([place])   . Respiratory failure, post-operative ([place]) 09/09/2012  . Shortness of breath    Past Surgical History:  Procedure Laterality Date  . ABDOMINAL AORTIC ANEURYSM REPAIR  09/08/2012   Procedure: ANEURYSM ABDOMINAL AORTIC REPAIR;  Surgeon: [name], MD;  Location: [facility];  Service: Vascular;  Laterality: N/A;  open repair ruptured abdomenal aortic aneurysm  . GANGLION CYST EXCISION     Right wrist   No Known Allergies Prior to Admission medications   Medication Sig Start Date End Date Taking? Authorizing Provider  atorvastatin (LIPITOR) 10 MG tablet  04/10/17   [provider]  doxycycline (VIBRA-TABS) 100 MG tablet Take 1 tablet (100 mg total) by mouth 2 (two) times daily. 04/24/18   [name], MD  ibuprofen (ADVIL,MOTRIN) 200 MG tablet Take 200 mg by mouth every 6 (six) hours as needed.    [provider]  irbesartan (AVAPRO) 150 MG tablet Take 1 tablet (150 mg total) by mouth daily. 01/04/17   Tanda Rockers, MD  Lansoprazole (PREVACID PO) Take by mouth daily.    [provider]   Social History   Socioeconomic History  . Marital status: Married    Spouse name: Not on file  . Number of children: Not on file  . Years of education:  Not on file  . Highest education level: Not on file  Occupational History  . Not on file  Social Needs  . Financial resource strain: Not on file  . Food insecurity:    Worry: Not on file    Inability: Not on file  . Transportation needs:    Medical: Not on file    Non-medical: Not on file  Tobacco Use  . Smoking status: Former Smoker    Packs/day: 1.00    Years: 60.00    Pack years: 60.00    Last attempt to quit: 09/08/2012    Years since quitting: 5.6  .  Smokeless tobacco: Never Used  Substance and Sexual Activity  . Alcohol use: No    Alcohol/week: 0.0 oz  . Drug use: No  . Sexual activity: Not Currently    Birth control/protection: None  Lifestyle  . Physical activity:    Days per week: Not on file    Minutes per session: Not on file  . Stress: Not on file  Relationships  . Social connections:    Talks on phone: Not on file    Gets together: Not on file    Attends religious service: Not on file    Active member of club or organization: Not on file    Attends meetings of clubs or organizations: Not on file    Relationship status: Not on file  . Intimate partner violence:    Fear of current or ex partner: Not on file    Emotionally abused: Not on file    Physically abused: Not on file    Forced sexual activity: Not on file  Other Topics Concern  . Not on file  Social History Narrative  . Not on file   Review of Systems  Constitutional: Negative for fatigue and unexpected weight change.  Eyes: Negative for visual disturbance.  Respiratory: Negative for cough, chest tightness and shortness of breath.   Cardiovascular: Negative for chest pain, palpitations and leg swelling.  Gastrointestinal: Negative for abdominal pain and [name] in stool.  Skin: Positive for color change (improving) and wound (healing).  Neurological: Negative for dizziness, light-headedness and headaches.  Psychiatric/Behavioral: Positive for agitation. Negative for dysphoric mood, self-injury and suicidal ideas. The patient is not nervous/anxious.        Objective:   Physical Exam  Constitutional: He is oriented to person, place, and time. He appears well-developed and well-nourished. No distress.  HENT:  Head: Normocephalic and atraumatic.  Eyes: Pupils are equal, round, and reactive to light. EOM are normal.  Neck: Neck supple.  Cardiovascular: Normal rate.  Pulmonary/Chest: Effort normal. No respiratory distress.  Musculoskeletal: Normal range of  motion.  Neurological: He is alert and oriented to person, place, and time.  Skin: Skin is warm and dry.  Left arm, forearm: erythema seems to have faded from his forearm, slight pink appearance surrounding the wound on the extensor forearm approximately 2.5 cm out, slight induration of that area without central fluctuance, center wound has a small eschar 3 mm, no discharge  Psychiatric: He has a normal mood and affect. His behavior is normal.  Nursing note and vitals reviewed.   Vitals:   05/01/18 1722  BP: 112/71  Pulse: 75  Temp: 98.3 F (36.8 C)  TempSrc: Oral  SpO2: 95%  Weight: 228 lb 12.8 oz (103.8 kg)  Height: '5\' 11"'$  (1.803 m)       Assessment & Plan:   Brandon [name] is a 77 y.o. male Abscess  of arm Cellulitis of left upper extremity  -Abscess appears to be healing significantly with home treatment.  Still recommended continued soaks/warm compresses for another 1 to 2 days, then if area continues to lessen with induration can stop compresses at that time.  RTC precautions discussed  Irritability  -Prior irritability treated with Lexapro.  Suspect some component of anxiety versus depression, denies SI/HI.  Initially started to treat his Lexapro, but may be cost prohibitive with current insurance plan.  Started on Celexa 20 mg daily with recheck in 3 to 4 weeks.  Potential side effects discussed, RTC precautions if worse  Meds ordered this encounter  Medications  . citalopram (CELEXA) 20 MG tablet    Sig: Take 1 tablet (20 mg total) by mouth daily.    Dispense:  30 tablet    Refill:  1   Patient Instructions   Ok to continue antibiotic until it is gone. Warm washcloth a few times per day for another few days, then can stop if no more swelling.   For irritability, restart medication once per day. I prescribed Celexa which is very similar to your previous Lexapro but may be more cost effective.  Take 1/day and follow-up with me in the next 3 to 4 weeks to let me know  how that is doing.   Return to the clinic or go to the nearest emergency room if any of your symptoms worsen or new symptoms occur.   IF you received an x-ray today, you will receive an invoice from [facility]. Please contact [facility] at [phone] with questions or concerns regarding your invoice.   IF you received labwork today, you will receive an invoice from [place]. Please contact [facility] at [phone] with questions or concerns regarding your invoice.   Our billing staff will not be able to assist you with questions regarding bills from these companies.  You will be contacted with the lab results as soon as they are available. The fastest way to get your results is to activate your My Chart account. Instructions are located on the last page of this paperwork. If you have not heard from [place] regarding the results in 2 weeks, please contact this office.      I personally performed the services described in this documentation, which was scribed in my presence. The recorded information has been reviewed and considered for accuracy and completeness, addended by me as needed, and agree with information above.  Signed,   [name], MD [facility] at [place].  05/03/18 12:59 PM

## 2018-05-01 NOTE — Patient Instructions (Addendum)
Ok to continue antibiotic until it is gone. Warm washcloth a few times per day for another few days, then can stop if no more swelling.   For irritability, restart medication once per day. I prescribed Celexa which is very similar to your previous Lexapro but may be more cost effective.  Take 1/day and follow-up with me in the next 3 to 4 weeks to let me know how that is doing.   Return to the clinic or go to the nearest emergency room if any of your symptoms worsen or new symptoms occur.   IF you received an x-ray today, you will receive an invoice from [facility]. Please contact [facility] at [phone] with questions or concerns regarding your invoice.   IF you received labwork today, you will receive an invoice from [facility]. Please contact [facility] at [phone] with questions or concerns regarding your invoice.   Our billing staff will not be able to assist you with questions regarding bills from these companies.  You will be contacted with the lab results as soon as they are available. The fastest way to get your results is to activate your My Chart account. Instructions are located on the last page of this paperwork. If you have not heard from [place] regarding the results in 2 weeks, please contact this office.

## 2018-05-11 ENCOUNTER — Ambulatory Visit: Payer: Medicare HMO | Admitting: Family Medicine

## 2018-05-25 ENCOUNTER — Other Ambulatory Visit: Payer: Self-pay | Admitting: Family Medicine

## 2018-05-25 NOTE — Telephone Encounter (Signed)
Patient is requesting a 90 day supply  Celexa refill Last OV:05/01/18 Last refill:05/01/18 30 tab/1 refill CVE:[id number] Pharmacy: [id number] - [place], [place] [phone] (Phone) [phone] (Fax)

## 2018-05-30 ENCOUNTER — Other Ambulatory Visit: Payer: Self-pay

## 2018-05-30 ENCOUNTER — Ambulatory Visit (INDEPENDENT_AMBULATORY_CARE_PROVIDER_SITE_OTHER): Payer: Medicare HMO | Admitting: Family Medicine

## 2018-05-30 ENCOUNTER — Encounter: Payer: Self-pay | Admitting: Family Medicine

## 2018-05-30 VITALS — BP 121/70 | HR 63 | Temp 98.0°F | Ht 72.0 in | Wt 225.0 lb

## 2018-05-30 DIAGNOSIS — L02414 Cutaneous abscess of left upper limb: Secondary | ICD-10-CM

## 2018-05-30 DIAGNOSIS — R454 Irritability and anger: Secondary | ICD-10-CM | POA: Diagnosis not present

## 2018-05-30 NOTE — Patient Instructions (Addendum)
  I'm glad to hear that the Celexa is improving your symptoms. Remain on same dose for now. Follow up with your primary provider or myself in next 2-3 months to discuss that med further.   Arm looks good today. If any return of redness or new wound - be seen right away.    IF you received an x-ray today, you will receive an invoice from [facility]. Please contact [facility] at [phone] with questions or concerns regarding your invoice.   IF you received labwork today, you will receive an invoice from [place]. Please contact [facility] at [phone] with questions or concerns regarding your invoice.   Our billing staff will not be able to assist you with questions regarding bills from these companies.  You will be contacted with the lab results as soon as they are available. The fastest way to get your results is to activate your My Chart account. Instructions are located on the last page of this paperwork. If you have not heard from [place] regarding the results in 2 weeks, please contact this office.

## 2018-05-30 NOTE — Progress Notes (Signed)
Subjective:  By signing my name below, I, Delton Coombes, attest that this documentation has been prepared under the direction and in the presence of [name], MD Electronically Signed: Delton Coombes Medical Scribe 05/30/2018 at 5:27 PM.   Patient ID: Brandon [name], male    DOB: 04-24-1941, 77 y.o.   MRN: [id number] Chief Complaint  Patient presents with  . check up arm  . medication check    HPI Brandon [name] is a 77 y.o. male who presents to Primary Care at [facility] for a follow up. He was started Celexa for irritability/ depression, one month ago. Patient reports that Celexa medication working well for him. Denies any knew side effects. Patient reported that previous left arm abscess that has resolved and no longer taking doxycyline.    Depression screen [facility] 2/9 05/30/2018 05/01/2018 04/27/2018 04/25/2018 04/24/2018  Decreased Interest 0 0 0 0 0  Down, Depressed, Hopeless 0 0 0 0 0  PHQ - 2 Score 0 0 0 0 0    Patient Active Problem List   Diagnosis Date Noted  . Upper airway cough syndrome 07/08/2017  . Dyspnea on exertion 01/04/2017  . Obesity (BMI 30-39.9) 01/04/2017  . Essential hypertension 12/05/2015  . Peripheral vascular disease, unspecified ([place]) 03/12/2014  . Atherosclerosis of native arteries of the extremities with intermittent claudication 03/12/2014  . Physical deconditioning 09/19/2012  . Chronic obstructive pulmonary disease with bronchospasm ([place]) 09/11/2012  . AAA (abdominal aortic aneurysm, ruptured) ([place]) 09/09/2012  . Hyperglycemia 09/09/2012  . Former tobacco use 09/08/2012   Past Medical History:  Diagnosis Date  . AAA (abdominal aortic aneurysm) ([place])   . COPD (chronic obstructive pulmonary disease) ([place])   . DIC (disseminated intravascular coagulation) ([place]) 09/09/2012  . Leg wound, left 12/12/2013  . Peripheral vascular disease ([place])   . Respiratory failure, post-operative ([place]) 09/09/2012  . Shortness of breath    Past Surgical History:   Procedure Laterality Date  . ABDOMINAL AORTIC ANEURYSM REPAIR  09/08/2012   Procedure: ANEURYSM ABDOMINAL AORTIC REPAIR;  Surgeon: [name], MD;  Location: [place];  Service: Vascular;  Laterality: N/A;  open repair ruptured abdomenal aortic aneurysm  . GANGLION CYST EXCISION     Right wrist   No Known Allergies Prior to Admission medications   Medication Sig Start Date End Date Taking? Authorizing Provider  atorvastatin (LIPITOR) 10 MG tablet  04/10/17  Yes [provider]  citalopram (CELEXA) 20 MG tablet TAKE 1 TABLET BY MOUTH EVERY DAY 05/26/18  Yes [name], MD  ibuprofen (ADVIL,MOTRIN) 200 MG tablet Take 200 mg by mouth every 6 (six) hours as needed.   Yes [provider]  irbesartan (AVAPRO) 150 MG tablet Take 1 tablet (150 mg total) by mouth daily. 01/04/17  Yes Tanda Rockers, MD  Lansoprazole (PREVACID PO) Take by mouth daily.   Yes [provider]   Social History   Socioeconomic History  . Marital status: Married    Spouse name: Not on file  . Number of children: Not on file  . Years of education: Not on file  . Highest education level: Not on file  Occupational History  . Not on file  Social Needs  . Financial resource strain: Not on file  . Food insecurity:    Worry: Not on file    Inability: Not on file  . Transportation needs:    Medical: Not on file    Non-medical: Not on file  Tobacco Use  .  Smoking status: Former Smoker    Packs/day: 1.00    Years: 60.00    Pack years: 60.00    Last attempt to quit: 09/08/2012    Years since quitting: 5.7  . Smokeless tobacco: Never Used  Substance and Sexual Activity  . Alcohol use: No    Alcohol/week: 0.0 oz  . Drug use: No  . Sexual activity: Not Currently    Birth control/protection: None  Lifestyle  . Physical activity:    Days per week: Not on file    Minutes per session: Not on file  . Stress: Not on file  Relationships  . Social connections:    Talks on phone: Not  on file    Gets together: Not on file    Attends religious service: Not on file    Active member of club or organization: Not on file    Attends meetings of clubs or organizations: Not on file    Relationship status: Not on file  . Intimate partner violence:    Fear of current or ex partner: Not on file    Emotionally abused: Not on file    Physically abused: Not on file    Forced sexual activity: Not on file  Other Topics Concern  . Not on file  Social History Narrative  . Not on file     Review of Systems     Objective:   Physical Exam  Constitutional: He is oriented to person, place, and time. He appears well-developed and well-nourished.  HENT:  Head: Normocephalic and atraumatic.  Eyes: Pupils are equal, round, and reactive to light. EOM are normal.  Neck: No JVD present. Carotid bruit is not present.  Cardiovascular: Normal rate, regular rhythm and normal heart sounds.  No murmur heard. Pulmonary/Chest: Effort normal and breath sounds normal. He has no rales.  Musculoskeletal: He exhibits no edema.  Neurological: He is alert and oriented to person, place, and time.  Skin: Skin is warm and dry.  Psychiatric: He has a normal mood and affect.  Vitals reviewed.    Left arm, there is a well healed scar, with no erythema and no significant induration.  Vitals:   05/30/18 1718  BP: 121/70  Pulse: 63  Temp: 98 F (36.7 C)  TempSrc: Oral  SpO2: 99%  Weight: 225 lb (102.1 kg)  Height: 6' (1.829 m)       Assessment & Plan:  Brandon [name] is a 77 y.o. male Irritability  -Irritability, possible depression.  Improved with Celexa.  Refill recently placed.  Can follow-up with his primary care provider in the next 3 to 6 months or I can see him during that time as well.  No change in meds for now  Abscess of left forearm  -Resolved, completed antibiotic.  RTC precautions if symptoms recur  No orders of the defined types were placed in this encounter.  Patient  Instructions    I'm glad to hear that the Celexa is improving your symptoms. Remain on same dose for now. Follow up with your primary provider or myself in next 2-3 months to discuss that med further.   Arm looks good today. If any return of redness or new wound - be seen right away.    IF you received an x-ray today, you will receive an invoice from [facility]. Please contact [facility] at [phone] with questions or concerns regarding your invoice.   IF you received labwork today, you will receive an invoice from [place]. Please  contact [facility] at [phone] with questions or concerns regarding your invoice.   Our billing staff will not be able to assist you with questions regarding bills from these companies.  You will be contacted with the lab results as soon as they are available. The fastest way to get your results is to activate your My Chart account. Instructions are located on the last page of this paperwork. If you have not heard from [place] regarding the results in 2 weeks, please contact this office.       I personally performed the services described in this documentation, which was scribed in my presence. The recorded information has been reviewed and considered for accuracy and completeness, addended by me as needed, and agree with information above.  Signed,   [name], MD [facility] at [place].  06/02/18 2:03 PM

## 2018-06-02 ENCOUNTER — Encounter: Payer: Self-pay | Admitting: Family Medicine

## 2019-10-18 ENCOUNTER — Other Ambulatory Visit: Payer: Self-pay

## 2019-10-18 ENCOUNTER — Telehealth (HOSPITAL_COMMUNITY): Payer: Self-pay | Admitting: *Deleted

## 2019-10-18 DIAGNOSIS — I739 Peripheral vascular disease, unspecified: Secondary | ICD-10-CM

## 2019-10-18 NOTE — Telephone Encounter (Signed)

## 2019-10-19 ENCOUNTER — Encounter: Payer: Self-pay | Admitting: Family

## 2019-10-19 ENCOUNTER — Ambulatory Visit (HOSPITAL_COMMUNITY)
Admission: RE | Admit: 2019-10-19 | Discharge: 2019-10-19 | Disposition: A | Discharge status: Home / Self Care | Payer: Medicare HMO | Source: Ambulatory Visit | Attending: Family | Admitting: Family

## 2019-10-19 ENCOUNTER — Ambulatory Visit (INDEPENDENT_AMBULATORY_CARE_PROVIDER_SITE_OTHER): Payer: Medicare HMO | Admitting: Family

## 2019-10-19 ENCOUNTER — Other Ambulatory Visit: Payer: Self-pay

## 2019-10-19 VITALS — BP 120/76 | HR 60 | Temp 97.6°F | Resp 22 | Ht 72.0 in | Wt 248.0 lb

## 2019-10-19 DIAGNOSIS — I739 Peripheral vascular disease, unspecified: Secondary | ICD-10-CM

## 2019-10-19 DIAGNOSIS — I713 Abdominal aortic aneurysm, ruptured, unspecified: Secondary | ICD-10-CM

## 2019-10-19 DIAGNOSIS — Z9889 Other specified postprocedural states: Secondary | ICD-10-CM

## 2019-10-19 NOTE — Progress Notes (Signed)
[facility] [place]  CC: Follow up Open AAA Repair  History of Present Illness  Brandon [name] is a 78 y.o. (12-29-1940) male who is status post repair of ruptured abdominal aortic aneurysm resection on 09/08/2012 by Dr. Kellie Simmering.  He also had bilateral extensive lower extremity thrombectomies   He has chronic COPD and did require home oxygen for a period of time following discharge from the hospital. He is off of his oxygen and has not smoked a cigarette since his surgery.  He can walk as far as he wants with no claudication is noted. He has worked, and still works outdoors as a Development worker, international aid.   He does have left knee pain occasionally, helped significantly by a topical hemp product.  He has a good appetite.  He returns today for ABI's.  He remains tobacco free and happy to be so. He denies abdominal or back pain.  He denies any history of stroke or TIA.  Diabetic: No Tobacco use former smoker, quit in October, 2013  He does not take a daily ASA, he does take a daily statin.   Past Medical History:  Diagnosis Date  . AAA (abdominal aortic aneurysm) ([place])   . COPD (chronic obstructive pulmonary disease) ([place])   . DIC (disseminated intravascular coagulation) ([place]) 09/09/2012  . Leg wound, left 12/12/2013  . Peripheral vascular disease ([place])   . Respiratory failure, post-operative ([place]) 09/09/2012  . Shortness of breath     Past Surgical History:  Procedure Laterality Date  . ABDOMINAL AORTIC ANEURYSM REPAIR  09/08/2012   Procedure: ANEURYSM ABDOMINAL AORTIC REPAIR;  Surgeon: [name], MD;  Location: [facility];  Service: Vascular;  Laterality: N/A;  open repair ruptured abdomenal aortic aneurysm  . GANGLION CYST EXCISION     Right wrist   Social History Social History   Socioeconomic History  . Marital status: Married    Spouse name: Not on file  . Number of children: Not on file  . Years of education: Not on file  . Highest education level:  Not on file  Occupational History  . Not on file  Social Needs  . Financial resource strain: Not on file  . Food insecurity    Worry: Not on file    Inability: Not on file  . Transportation needs    Medical: Not on file    Non-medical: Not on file  Tobacco Use  . Smoking status: Former Smoker    Packs/day: 1.00    Years: 60.00    Pack years: 60.00    Quit date: 09/08/2012    Years since quitting: 7.1  . Smokeless tobacco: Never Used  Substance and Sexual Activity  . Alcohol use: No    Alcohol/week: 0.0 standard drinks  . Drug use: No  . Sexual activity: Not Currently    Birth control/protection: None  Lifestyle  . Physical activity    Days per week: Not on file    Minutes per session: Not on file  . Stress: Not on file  Relationships  . Social Herbalist on phone: Not on file    Gets together: Not on file    Attends religious service: Not on file    Active member of club or organization: Not on file    Attends meetings of clubs or organizations: Not on file    Relationship status: Not on file  . Intimate partner violence    Fear of current or ex partner: Not on  file    Emotionally abused: Not on file    Physically abused: Not on file    Forced sexual activity: Not on file  Other Topics Concern  . Not on file  Social History Narrative  . Not on file   Family History Family History  Problem Relation Age of Onset  . Dementia Mother   . Cancer Mother   . Cancer Father   . Heart disease Father    Current Outpatient Medications on File Prior to Visit  Medication Sig Dispense Refill  . atorvastatin (LIPITOR) 10 MG tablet     . citalopram (CELEXA) 20 MG tablet TAKE 1 TABLET BY MOUTH EVERY DAY 90 tablet 0  . irbesartan (AVAPRO) 150 MG tablet Take 1 tablet (150 mg total) by mouth daily. 30 tablet 11  . Lansoprazole (PREVACID PO) Take by mouth daily.     No current facility-administered medications on file prior to visit.    No Known Allergies  ROS:  See HPI for pertinent positives and negatives.    Physical Examination  Vitals:   10/19/19 1429 10/19/19 1434  BP: 131/82 120/76  Pulse: 60   Resp: (!) 22   Temp: 97.6 F (36.4 C)   TempSrc: Oral   SpO2: 96%   Weight: 248 lb (112.5 kg)   Height: 6' (1.829 m)    Body mass index is 33.63 kg/m.  General: A&O x 3, WD, obese male in NAD. HEENT: no gross abnormalities  Pulmonary: Sym exp, some pursed lips expirations, wearing a mask, fair air movement in all fields, no rales, rhonchi, or wheezing.  Cardiac: RRR, Nl S1, S2, no detected murmur.  Carotid Bruits Right Left   Negative Negative   Abdominal aortic pulse is not palpable. Radial pulses are and equal.                         VASCULAR EXAM: Extremities without ischemic changes, without Gangrene; without open wounds.                                                                                                         LE Pulses Right Left       POPLITEAL  not palpable   not palpable       POSTERIOR TIBIAL   palpable    palpable        DORSALIS PEDIS      ANTERIOR TIBIAL not palpable  not palpable    Gastrointestinal: soft, NTND, -G/R, - HSM, - masses, - CVAT B, large panus Musculoskeletal: M/S 5/5 throughout, Extremities without ischemic changes. Skin: No rash, no cellulitis, no ulcers.   Neurologic: Pain and light touch intact in extremities, Motor exam as listed above.   DATA  ABI Findings (10-19-19): +---------+------------------+-----+---------+--------+ Right    Rt Pressure (mmHg)IndexWaveform Comment  +---------+------------------+-----+---------+--------+ Brachial 131                                      +---------+------------------+-----+---------+--------+  PTA      162               1.20 triphasic         +---------+------------------+-----+---------+--------+ DP       154               1.14 biphasic          +---------+------------------+-----+---------+--------+ Great  Toe129               0.96 Normal            +---------+------------------+-----+---------+--------+  +---------+------------------+-----+---------+-------+ Left     Lt Pressure (mmHg)IndexWaveform Comment +---------+------------------+-----+---------+-------+ Brachial 135                                     +---------+------------------+-----+---------+-------+ PTA      150               1.11 triphasic        +---------+------------------+-----+---------+-------+ DP       151               1.12 biphasic         +---------+------------------+-----+---------+-------+ Great Toe97                0.72 Normal           +---------+------------------+-----+---------+-------+  +-------+-----------+-----------+------------+------------+ ABI/TBIToday's ABIToday's TBIPrevious ABIPrevious TBI +-------+-----------+-----------+------------+------------+ Right  1.20       0.96       1.17        0.94         +-------+-----------+-----------+------------+------------+ Left   1.12       0.72       1.18        0.96         +-------+-----------+-----------+------------+------------+ Summary: Right: Resting right ankle-brachial index is within normal range. No evidence of significant right lower extremity arterial disease. The right toe-brachial index is normal.  Left: Resting left ankle-brachial index is within normal range. No evidence of significant left lower extremity arterial disease. The left toe-brachial index is abnormal.   Medical Decision Making  Brandon [name] is a 78 y.o. male who presents s/p open AAA repair.  Pt is asymptomatic, no back or abdominal pain, no claudication sx's with walking.  He works as a Development worker, international aid, enjoys his work.   ABI's remain normal with all triphasic waveforms.     The next ABI will be scheduled for 18 months.  The patient will follow up with [place] in 18 months with these studies   I discussed in depth  with the patient the nature of atherosclerosis, and emphasized the importance of maximal medical management including strict control of blood pressure, blood glucose, and lipid levels, obtaining regular exercise, and cessation of smoking.    The patient is aware that without maximal medical management the underlying atherosclerotic disease process will progress, limiting the benefit of any interventions.   Thank you for allowing [place] to participate in this patient's care.  Clemon Chambers, RN, MSN, FNP-C [facility] [name] Office: [phone]   Clinic Physician: Donzetta Matters   10/19/2019, 2:48 PM

## 2019-11-12 ENCOUNTER — Other Ambulatory Visit: Payer: Self-pay | Admitting: *Deleted

## 2019-11-12 DIAGNOSIS — I739 Peripheral vascular disease, unspecified: Secondary | ICD-10-CM

## 2020-02-19 DIAGNOSIS — L0201 Cutaneous abscess of face: Secondary | ICD-10-CM | POA: Diagnosis not present

## 2020-02-26 DIAGNOSIS — J449 Chronic obstructive pulmonary disease, unspecified: Secondary | ICD-10-CM | POA: Diagnosis not present

## 2020-02-26 DIAGNOSIS — E7800 Pure hypercholesterolemia, unspecified: Secondary | ICD-10-CM | POA: Diagnosis not present

## 2020-02-26 DIAGNOSIS — I10 Essential (primary) hypertension: Secondary | ICD-10-CM | POA: Diagnosis not present

## 2020-05-30 DIAGNOSIS — J449 Chronic obstructive pulmonary disease, unspecified: Secondary | ICD-10-CM | POA: Diagnosis not present

## 2020-05-30 DIAGNOSIS — I10 Essential (primary) hypertension: Secondary | ICD-10-CM | POA: Diagnosis not present

## 2020-05-30 DIAGNOSIS — E7800 Pure hypercholesterolemia, unspecified: Secondary | ICD-10-CM | POA: Diagnosis not present

## 2020-06-09 DIAGNOSIS — K219 Gastro-esophageal reflux disease without esophagitis: Secondary | ICD-10-CM | POA: Diagnosis not present

## 2020-06-09 DIAGNOSIS — Z7189 Other specified counseling: Secondary | ICD-10-CM | POA: Diagnosis not present

## 2020-06-09 DIAGNOSIS — E7800 Pure hypercholesterolemia, unspecified: Secondary | ICD-10-CM | POA: Diagnosis not present

## 2020-06-09 DIAGNOSIS — I10 Essential (primary) hypertension: Secondary | ICD-10-CM | POA: Diagnosis not present

## 2020-06-09 DIAGNOSIS — R454 Irritability and anger: Secondary | ICD-10-CM | POA: Diagnosis not present

## 2020-06-09 DIAGNOSIS — L309 Dermatitis, unspecified: Secondary | ICD-10-CM | POA: Diagnosis not present

## 2020-06-09 DIAGNOSIS — J449 Chronic obstructive pulmonary disease, unspecified: Secondary | ICD-10-CM | POA: Diagnosis not present

## 2020-07-03 DIAGNOSIS — J449 Chronic obstructive pulmonary disease, unspecified: Secondary | ICD-10-CM | POA: Diagnosis not present

## 2020-07-03 DIAGNOSIS — E7800 Pure hypercholesterolemia, unspecified: Secondary | ICD-10-CM | POA: Diagnosis not present

## 2020-07-03 DIAGNOSIS — I10 Essential (primary) hypertension: Secondary | ICD-10-CM | POA: Diagnosis not present

## 2020-07-24 DIAGNOSIS — J449 Chronic obstructive pulmonary disease, unspecified: Secondary | ICD-10-CM | POA: Diagnosis not present

## 2020-07-24 DIAGNOSIS — E7800 Pure hypercholesterolemia, unspecified: Secondary | ICD-10-CM | POA: Diagnosis not present

## 2020-07-24 DIAGNOSIS — I10 Essential (primary) hypertension: Secondary | ICD-10-CM | POA: Diagnosis not present

## 2020-12-07 ENCOUNTER — Ambulatory Visit (HOSPITAL_COMMUNITY)
Admission: EM | Admit: 2020-12-07 | Discharge: 2020-12-07 | Disposition: A | Discharge status: Nursing Home (Includes ICF) | Payer: Medicare Other | Attending: Emergency Medicine | Admitting: Emergency Medicine

## 2020-12-07 ENCOUNTER — Other Ambulatory Visit: Payer: Self-pay

## 2020-12-07 ENCOUNTER — Emergency Department (HOSPITAL_COMMUNITY): Payer: Medicare Other

## 2020-12-07 ENCOUNTER — Encounter (HOSPITAL_COMMUNITY): Payer: Self-pay

## 2020-12-07 ENCOUNTER — Emergency Department (HOSPITAL_COMMUNITY)
Admission: EM | Admit: 2020-12-07 | Discharge: 2020-12-08 | Disposition: A | Discharge status: Home / Self Care | Payer: Medicare Other | Attending: Emergency Medicine | Admitting: Emergency Medicine

## 2020-12-07 DIAGNOSIS — Z7951 Long term (current) use of inhaled steroids: Secondary | ICD-10-CM | POA: Diagnosis not present

## 2020-12-07 DIAGNOSIS — Z7982 Long term (current) use of aspirin: Secondary | ICD-10-CM | POA: Diagnosis not present

## 2020-12-07 DIAGNOSIS — J029 Acute pharyngitis, unspecified: Secondary | ICD-10-CM | POA: Diagnosis not present

## 2020-12-07 DIAGNOSIS — J069 Acute upper respiratory infection, unspecified: Secondary | ICD-10-CM

## 2020-12-07 DIAGNOSIS — R509 Fever, unspecified: Secondary | ICD-10-CM

## 2020-12-07 DIAGNOSIS — I10 Essential (primary) hypertension: Secondary | ICD-10-CM | POA: Diagnosis not present

## 2020-12-07 DIAGNOSIS — R0602 Shortness of breath: Secondary | ICD-10-CM

## 2020-12-07 DIAGNOSIS — R059 Cough, unspecified: Secondary | ICD-10-CM | POA: Diagnosis present

## 2020-12-07 DIAGNOSIS — J449 Chronic obstructive pulmonary disease, unspecified: Secondary | ICD-10-CM | POA: Diagnosis not present

## 2020-12-07 DIAGNOSIS — Z87891 Personal history of nicotine dependence: Secondary | ICD-10-CM | POA: Insufficient documentation

## 2020-12-07 DIAGNOSIS — Z20822 Contact with and (suspected) exposure to covid-19: Secondary | ICD-10-CM | POA: Insufficient documentation

## 2020-12-07 DIAGNOSIS — Z79899 Other long term (current) drug therapy: Secondary | ICD-10-CM | POA: Insufficient documentation

## 2020-12-07 LAB — CBC WITH DIFFERENTIAL/PLATELET
Abs Immature Granulocytes: 0.15 10*3/uL — ABNORMAL HIGH (ref 0.00–0.07)
Basophils Absolute: 0 10*3/uL (ref 0.0–0.1)
Basophils Relative: 0 %
Eosinophils Absolute: 0 10*3/uL (ref 0.0–0.5)
Eosinophils Relative: 0 %
HCT: 49 % (ref 39.0–52.0)
Hemoglobin: 16.1 g/dL (ref 13.0–17.0)
Immature Granulocytes: 1 %
Lymphocytes Relative: 10 %
Lymphs Abs: 1.1 10*3/uL (ref 0.7–4.0)
MCH: 32.5 pg (ref 26.0–34.0)
MCHC: 32.9 g/dL (ref 30.0–36.0)
MCV: 98.8 fL (ref 80.0–100.0)
Monocytes Absolute: 1.1 10*3/uL — ABNORMAL HIGH (ref 0.1–1.0)
Monocytes Relative: 10 %
Neutro Abs: 8.4 10*3/uL — ABNORMAL HIGH (ref 1.7–7.7)
Neutrophils Relative %: 79 %
Platelets: 153 10*3/uL (ref 150–400)
RBC: 4.96 MIL/uL (ref 4.22–5.81)
RDW: 12.9 % (ref 11.5–15.5)
WBC: 10.7 10*3/uL — ABNORMAL HIGH (ref 4.0–10.5)
nRBC: 0 % (ref 0.0–0.2)

## 2020-12-07 LAB — COMPREHENSIVE METABOLIC PANEL
ALT: 42 U/L (ref 0–44)
AST: 36 U/L (ref 15–41)
Albumin: 3.4 g/dL — ABNORMAL LOW (ref 3.5–5.0)
Alkaline Phosphatase: 55 U/L (ref 38–126)
Anion gap: 9 (ref 5–15)
BUN: 23 mg/dL (ref 8–23)
CO2: 24 mmol/L (ref 22–32)
Calcium: 8.6 mg/dL — ABNORMAL LOW (ref 8.9–10.3)
Chloride: 104 mmol/L (ref 98–111)
Creatinine, Ser: 1.21 mg/dL (ref 0.61–1.24)
GFR, Estimated: >60 mL/min (ref >60)
Glucose, Bld: 120 mg/dL — ABNORMAL HIGH (ref 70–99)
Potassium: 4.1 mmol/L (ref 3.5–5.1)
Sodium: 137 mmol/L (ref 135–145)
Total Bilirubin: 1.3 mg/dL — ABNORMAL HIGH (ref 0.3–1.2)
Total Protein: 6.5 g/dL (ref 6.5–8.1)

## 2020-12-07 LAB — SARS CORONAVIRUS 2 BY RT PCR (HOSPITAL ORDER, PERFORMED IN ~~LOC~~ HOSPITAL LAB): SARS Coronavirus 2: NEGATIVE

## 2020-12-07 MED ORDER — ACETAMINOPHEN 325 MG PO TABS
650.0000 mg | ORAL_TABLET | Freq: Once | ORAL | Status: AC
  Administered 2020-12-07: 650 mg via ORAL

## 2020-12-07 MED ORDER — ACETAMINOPHEN 325 MG PO TABS
ORAL_TABLET | ORAL | Status: AC
  Filled 2020-12-07: qty 2

## 2020-12-07 NOTE — ED Notes (Signed)
Report given to Rondel Oh, NP.

## 2020-12-07 NOTE — ED Triage Notes (Signed)
Pt presents with cough, shortness of breath and fever 100.7 F x 1 day.

## 2020-12-07 NOTE — ED Triage Notes (Signed)
C/O cough and fever since yesterday morning.

## 2020-12-07 NOTE — ED Notes (Signed)
Discussed pt with Dr. Alphonzo Cruise.

## 2020-12-07 NOTE — Discharge Instructions (Signed)
I am concerned that you have a bacterial, COVID or influenza pneumonia.  I think we need more information including labs and a chest x-ray, to make sure that you are safe to be treated as an outpatient.  You may require some breathing treatments as well.  Let them know if your shortness of breath gets worse, or for any other concerns.

## 2020-12-07 NOTE — ED Provider Notes (Signed)
HPI  SUBJECTIVE:  Brandon [name] is a 80 y.o. male who presents with body aches, sore throat, cough, worsening shortness of breath starting yesterday.  Reports fever of 100.1 at home. No headache, nasal congestion, rhinorrhea, loss of sense of smell or taste.  He states that he had a single episode where he coughed up yellowish phlegm which is different than his baseline color, but he has not coughed anything else up since then.  No wheezing, chest pain, nausea, vomiting, diarrhea, abdominal pain.  No antibiotics in the past 3 months.  No antipyretic at home.  No known COVID or flu exposure.  He got the second dose of [employer] vaccine in November 21, he got his flu vaccine this year.  He tried Mucinex DM with improvement in his cough.  No aggravating factors.  He has a past medical history of COPD and does not know what his baseline oxygen saturation is.  He has a history of aortic abdominal aneurysm, coronary disease, peripheral vascular disease, hypertension, DIC, remote history of pneumonia.  No history of diabetes, chronic kidney disease, smoking.  LOV:FIEPPI, Shanon Brow, MD   Past Medical History:  Diagnosis Date  . AAA (abdominal aortic aneurysm) ([place])   . COPD (chronic obstructive pulmonary disease) ([place])   . DIC (disseminated intravascular coagulation) ([place]) 09/09/2012  . Leg wound, left 12/12/2013  . Peripheral vascular disease ([place])   . Respiratory failure, post-operative ([place]) 09/09/2012  . Shortness of breath     Past Surgical History:  Procedure Laterality Date  . ABDOMINAL AORTIC ANEURYSM REPAIR  09/08/2012   Procedure: ANEURYSM ABDOMINAL AORTIC REPAIR;  Surgeon: [name], MD;  Location: [facility];  Service: Vascular;  Laterality: N/A;  open repair ruptured abdomenal aortic aneurysm  . GANGLION CYST EXCISION     Right wrist    Family History  Problem Relation Age of Onset  . Dementia Mother   . Cancer Mother   . Cancer Father   . Heart disease Father     Social History    Tobacco Use  . Smoking status: Former Smoker    Packs/day: 1.00    Years: 60.00    Pack years: 60.00    Quit date: 09/08/2012    Years since quitting: 8.2  . Smokeless tobacco: Never Used  Substance Use Topics  . Alcohol use: No    Alcohol/week: 0.0 standard drinks  . Drug use: No    No current facility-administered medications for this encounter.  Current Outpatient Medications:  .  Fluticasone-Umeclidin-Vilant (TRELEGY ELLIPTA) 200-62.5-25 MCG/INH AEPB, 1 puff, Disp: , Rfl:  .  aspirin 81 MG chewable tablet, 1 tablet, Disp: , Rfl:  .  atorvastatin (LIPITOR) 10 MG tablet, , Disp: , Rfl:  .  citalopram (CELEXA) 20 MG tablet, TAKE 1 TABLET BY MOUTH EVERY DAY, Disp: 90 tablet, Rfl: 0 .  irbesartan (AVAPRO) 150 MG tablet, Take 1 tablet (150 mg total) by mouth daily., Disp: 30 tablet, Rfl: 11 .  Lansoprazole (PREVACID PO), Take by mouth daily., Disp: , Rfl:   Allergies  Allergen Reactions  . Umeclidinium-Vilanterol Other (See Comments)     ROS  As noted in HPI.   Physical Exam  BP 101/71   Pulse (!) 109   Temp (!) 101.8 F (38.8 C) (Oral)   Resp (!) 28   SpO2 91%    Constitutional: Well developed, well nourished, increased inspiratory effort.  Slightly dyspneic with speaking Eyes:  EOMI, conjunctiva normal bilaterally HENT: Normocephalic, atraumatic,mucus membranes moist  Respiratory: Normal inspiratory effort, rhonchi throughout, poor air movement. Cardiovascular: Regular tachycardia, no murmurs rubs or gallops GI: nondistended skin: No rash, skin intact Musculoskeletal: no deformities Neurologic: Alert & oriented x 3, no focal neuro deficits Psychiatric: Speech and behavior appropriate   ED Course   Medications  acetaminophen (TYLENOL) tablet 650 mg (650 mg Oral Given 12/07/20 1301)    No orders of the defined types were placed in this encounter.   No results found for this or any previous visit (from the past 24 hour(s)). No results found.  ED  Clinical Impression  1. Fever, unspecified fever cause   2. Shortness of breath      ED Assessment/Plan  Concern for COVID, flu vs bacterial pneumonia.  Given his multiple comorbidities, feel that he needs labs, imaging prior to being considered for outpatient treatment.  Suspect that he may need some nebulized albuterol as well as he has some increased inspiratory effort, however he does not have any signs of impending respiratory failure. He was given Tylenol here.  Unable to find record of previous O2 saturations.  He desats to 89% while talking.  He does not know what his baseline oxygen saturation is.  Feel that he is stable to go by private vehicle.  He states that his family can take him to the [facility] ED right now.  Discussed rationale for transfer to the emergency department with the patient.  He agrees to go.  Meds ordered this encounter  Medications  . acetaminophen (TYLENOL) tablet 650 mg    *This clinic note was created using [employer]. Therefore, there may be occasional mistakes despite careful proofreading.   ?    Melynda Ripple, MD 12/07/20 1342

## 2020-12-08 MED ORDER — AEROCHAMBER PLUS FLO-VU MISC: 2 refills | Status: DC

## 2020-12-08 MED ORDER — ALBUTEROL SULFATE HFA 108 (90 BASE) MCG/ACT IN AERS: 2.0000 | INHALATION_SPRAY | RESPIRATORY_TRACT | 1 refills | Status: AC | PRN

## 2020-12-08 NOTE — ED Provider Notes (Signed)
[facility] EMERGENCY DEPARTMENT Provider Note   CSN: [id number] Arrival date & time: 12/07/20  1352     History Chief Complaint  Patient presents with  . Cough    Brandon [name] is a 80 y.o. male.  HPI Patient reports Saturday, day before yesterday, he started to get a sore throat with hoarse voice.  He denies headache or generalized body aches.  He sent his wife to get some Mucinex DM.  He reports he started to clear some mucus.  He reports some cough moved down to his chest but he felt like the Mucinex DM helped quite a bit.  He reports his voice is still hoarse but his throat is less sore.  He denies chest pain or feeling short of breath.  He reports over the past day he did notice some increased shortness of breath with exertion but not at rest.  No swelling of the legs.  No calf pain.  Patient denies sick contact that he is aware of.  Patient was seen at urgent care and referred to the emergency department for concern for possible Covid versus flu versus bacterial pneumonia.  Sent for further diagnostic evaluation.    Past Medical History:  Diagnosis Date  . AAA (abdominal aortic aneurysm) ([place])   . COPD (chronic obstructive pulmonary disease) ([place])   . DIC (disseminated intravascular coagulation) ([place]) 09/09/2012  . Leg wound, left 12/12/2013  . Peripheral vascular disease ([facility])   . Respiratory failure, post-operative ([place]) 09/09/2012  . Shortness of breath     Patient Active Problem List   Diagnosis Date Noted  . Upper airway cough syndrome 07/08/2017  . Dyspnea on exertion 01/04/2017  . Obesity (BMI 30-39.9) 01/04/2017  . Essential hypertension 12/05/2015  . Peripheral vascular disease, unspecified ([place]) 03/12/2014  . Atherosclerosis of native arteries of the extremities with intermittent claudication 03/12/2014  . Physical deconditioning 09/19/2012  . Chronic obstructive pulmonary disease with bronchospasm ([place]) 09/11/2012  . AAA (abdominal  aortic aneurysm, ruptured) ([place]) 09/09/2012  . Hyperglycemia 09/09/2012  . Former tobacco use 09/08/2012    Past Surgical History:  Procedure Laterality Date  . ABDOMINAL AORTIC ANEURYSM REPAIR  09/08/2012   Procedure: ANEURYSM ABDOMINAL AORTIC REPAIR;  Surgeon: [name], MD;  Location: [facility];  Service: Vascular;  Laterality: N/A;  open repair ruptured abdomenal aortic aneurysm  . GANGLION CYST EXCISION     Right wrist       Family History  Problem Relation Age of Onset  . Dementia Mother   . Cancer Mother   . Cancer Father   . Heart disease Father     Social History   Tobacco Use  . Smoking status: Former Smoker    Packs/day: 1.00    Years: 60.00    Pack years: 60.00    Quit date: 09/08/2012    Years since quitting: 8.2  . Smokeless tobacco: Never Used  Substance Use Topics  . Alcohol use: No    Alcohol/week: 0.0 standard drinks  . Drug use: No    Home Medications Prior to Admission medications   Medication Sig Start Date End Date Taking? Authorizing Provider  albuterol (VENTOLIN HFA) 108 (90 Base) MCG/ACT inhaler Inhale 2 puffs into the lungs every 4 (four) hours as needed for wheezing or shortness of breath. 12/08/20  Yes Charlesetta Shanks, MD  Spacer/Aero-Holding Chambers (AEROCHAMBER PLUS WITH MASK) inhaler Use as instructed 12/08/20  Yes Charlesetta Shanks, MD  aspirin 81 MG chewable tablet 1 tablet  [provider]  atorvastatin (LIPITOR) 10 MG tablet  04/10/17   [provider]  citalopram (CELEXA) 20 MG tablet TAKE 1 TABLET BY MOUTH EVERY DAY 05/26/18   [name], MD  Fluticasone-Umeclidin-Vilant (TRELEGY ELLIPTA) 200-62.5-25 MCG/INH AEPB 1 puff 06/09/20   [provider]  irbesartan (AVAPRO) 150 MG tablet Take 1 tablet (150 mg total) by mouth daily. 01/04/17   Tanda Rockers, MD  Lansoprazole (PREVACID PO) Take by mouth daily.    [provider]    Allergies    Umeclidinium-vilanterol  Review of Systems    Review of Systems 10 systems reviewed and negative except as per HPI Physical Exam Updated Vital Signs BP 134/78   Pulse 81   Temp 98.9 F (37.2 C)   Resp 17   SpO2 95%   Physical Exam Constitutional:      Appearance: He is well-developed and well-nourished.  HENT:     Head: Normocephalic and atraumatic.     Mouth/Throat:     Mouth: Mucous membranes are moist.     Pharynx: Oropharynx is clear.     Comments: Posterior airway widely patent.  No tonsillar exudates or posterior airway swelling or erythema. Eyes:     Extraocular Movements: EOM normal.     Pupils: Pupils are equal, round, and reactive to light.  Cardiovascular:     Rate and Rhythm: Normal rate and regular rhythm.     Pulses: Intact distal pulses.     Heart sounds: Normal heart sounds.  Pulmonary:     Effort: Pulmonary effort is normal.     Breath sounds: Normal breath sounds.     Comments: Breath sounds soft at bases.  No gross crackle or rhonchi. Abdominal:     General: Bowel sounds are normal. There is no distension.     Palpations: Abdomen is soft.     Tenderness: There is no abdominal tenderness.  Musculoskeletal:        General: No edema. Normal range of motion.     Cervical back: Neck supple.  Skin:    General: Skin is warm, dry and intact.  Neurological:     Mental Status: He is alert and oriented to person, place, and time.     GCS: GCS eye subscore is 4. GCS verbal subscore is 5. GCS motor subscore is 6.     Coordination: Coordination normal.     Deep Tendon Reflexes: Strength normal.  Psychiatric:        Mood and Affect: Mood and affect and mood normal.     ED Results / Procedures / Treatments   Labs (all labs ordered are listed, but only abnormal results are displayed) Labs Reviewed  CBC WITH DIFFERENTIAL/PLATELET - Abnormal; Notable for the following components:      Result Value   WBC 10.7 (*)    Neutro Abs 8.4 (*)    Monocytes Absolute 1.1 (*)    Abs Immature Granulocytes 0.15 (*)     All other components within normal limits  COMPREHENSIVE METABOLIC PANEL - Abnormal; Notable for the following components:   Glucose, Bld 120 (*)    Calcium 8.6 (*)    Albumin 3.4 (*)    Total Bilirubin 1.3 (*)    All other components within normal limits  SARS CORONAVIRUS 2 BY RT PCR [facility] ORDER, [place] LAB)    EKG EKG Interpretation  Date/Time:  Sunday December 07 2020 16:08:05 EST Ventricular Rate:  83 PR Interval:  140  QRS Duration: 128 QT Interval:  382 QTC Calculation: 448 R Axis:   -20 Text Interpretation: Normal sinus rhythm Right bundle branch block Inferior infarct , age undetermined Abnormal ECG no sig change from previous Confirmed by Charlesetta Shanks [phone]) on 12/08/2020 9:02:58 AM   Radiology DG Chest Portable 1 View  Result Date: 12/07/2020 CLINICAL DATA:  80 year old male with history of cough and fever since yesterday morning. Shortness of breath. EXAM: PORTABLE CHEST 1 VIEW COMPARISON:  Chest x-ray 11/17/2017. FINDINGS: Lung volumes are normal. No consolidative airspace disease. No pleural effusions. No pneumothorax. No pulmonary nodule or mass noted. Pulmonary vasculature and the cardiomediastinal silhouette are within normal limits. Atherosclerosis in the thoracic aorta. IMPRESSION: 1.  No radiographic evidence of acute cardiopulmonary disease. 2. Aortic atherosclerosis. Electronically Signed   By: Vinnie Langton M.D.   On: 12/07/2020 17:12    Procedures Procedures (including critical care time)  Medications Ordered in ED Medications - No data to display  ED Course  I have reviewed the triage vital signs and the nursing notes.  Pertinent labs & imaging results that were available during my care of the patient were reviewed by me and considered in my medical decision making (see chart for details).    MDM Rules/Calculators/A&P                          Patient presents with symptoms consistent with URI.  Patient has  tested negative for Covid.  Chest x-ray is clear.  Oxygen saturation at rest is mid 90s to 100%.  Patient does not have respiratory distress at rest.  He has had low-grade fever, pharyngitis and cough.  Patient has had influenza and Covid vaccinations.  He quit smoking 8 years ago.  At this time will treat for viral URI.  Recommendations for Tylenol for body ache and fever, salt water gargle for sore throat, albuterol inhaler every 4-6 hours for cough or wheeze, continued Mucinex as needed and close follow-up with PCP this week for recheck. Final Clinical Impression(s) / ED Diagnoses Final diagnoses:  Upper respiratory tract infection, unspecified type  Pharyngitis, unspecified etiology    Rx / DC Orders ED Discharge Orders         Ordered    albuterol (VENTOLIN HFA) 108 (90 Base) MCG/ACT inhaler  Every 4 hours PRN        12/08/20 1011    Spacer/Aero-Holding Chambers (AEROCHAMBER PLUS WITH MASK) inhaler        12/08/20 1011           Charlesetta Shanks, MD 12/08/20 1012

## 2020-12-08 NOTE — Discharge Instructions (Signed)
1.  Take extra strength Tylenol every 6 hours as needed for body aches or fever. 2.  Use albuterol inhaler with a spacer chamber, 2 puffs every 4-6 hours for wheezing or cough. 3.  You may continue to use over-the-counter Mucinex as needed. 4.  See your family doctor for recheck this week. 5.  Return to the emergency department if you get any worsening of shortness of breath, chest pain or other concerning symptoms.

## 2021-07-03 ENCOUNTER — Other Ambulatory Visit: Payer: Self-pay | Admitting: *Deleted

## 2021-07-03 DIAGNOSIS — Z87891 Personal history of nicotine dependence: Secondary | ICD-10-CM

## 2021-07-27 ENCOUNTER — Encounter: Payer: Self-pay | Admitting: Acute Care

## 2021-07-27 ENCOUNTER — Ambulatory Visit (INDEPENDENT_AMBULATORY_CARE_PROVIDER_SITE_OTHER): Payer: Medicare (Managed Care) | Admitting: Acute Care

## 2021-07-27 ENCOUNTER — Other Ambulatory Visit: Payer: Self-pay

## 2021-07-27 DIAGNOSIS — Z87891 Personal history of nicotine dependence: Secondary | ICD-10-CM

## 2021-07-27 NOTE — Patient Instructions (Signed)

## 2021-07-27 NOTE — Progress Notes (Signed)
Virtual Visit via Telephone Note  I connected with [name] on 07/27/21 at  4:30 PM EDT by telephone and verified that I am speaking with the correct person using two identifiers.  Location: Patient: At home Provider: [place], [place], [place], [address]    I discussed the limitations, risks, security and privacy concerns of performing an evaluation and management service by telephone and the availability of in person appointments. I also discussed with the patient that there may be a patient responsible charge related to this service. The patient expressed understanding and agreed to proceed.   Shared Decision Making Visit Lung Cancer Screening Program [phone])   Eligibility: Age 80 y.o. Pack Years Smoking History Calculation 52 pack year smoking history (# packs/per year x # years smoked) Recent History of coughing up blood  no Unexplained weight loss? no ( >Than 15 pounds within the last 6 months ) Prior History Lung / other cancer no (Diagnosis within the last 5 years already requiring surveillance chest CT Scans). Smoking Status Former Smoker Former Smokers: Years since quit: 9 years  Quit Date: 2013  Visit Components: Discussion included one or more decision making aids. yes Discussion included risk/benefits of screening. yes Discussion included potential follow up diagnostic testing for abnormal scans. yes Discussion included meaning and risk of over diagnosis. yes Discussion included meaning and risk of False Positives. yes Discussion included meaning of total radiation exposure. yes  Counseling Included: Importance of adherence to annual lung cancer LDCT screening. yes Impact of comorbidities on ability to participate in the program. yes Ability and willingness to under diagnostic treatment. yes  Smoking Cessation Counseling: Current Smokers:  Discussed importance of smoking cessation. yes Information about tobacco cessation classes and  interventions provided to patient. yes Patient provided with "ticket" for LDCT Scan. yes Symptomatic Patient. no  Counseling Diagnosis Code: Tobacco Use Z72.0 Asymptomatic Patient yes  Counseling (Intermediate counseling: > three minutes counseling) [id number] Former Smokers:  Discussed the importance of maintaining cigarette abstinence. yes Diagnosis Code: Personal History of Nicotine Dependence. [id number] Information about tobacco cessation classes and interventions provided to patient. Yes Patient provided with "ticket" for LDCT Scan. yes Written Order for Lung Cancer Screening with LDCT placed in Epic. Yes (CT Chest Lung Cancer Screening Low Dose W/O CM) [id number] Z12.2-Screening of respiratory organs Z87.891-Personal history of nicotine dependence  I spent 25 minutes of face to face time with [name] discussing the risks and benefits of lung cancer screening. We viewed a power point together that explained in detail the above noted topics. We took the time to pause the power point at intervals to allow for questions to be asked and answered to ensure understanding. We discussed that he had taken the single most powerful action possible to decrease his risk of developing lung cancer when he quit smoking. I counseled him to remain smoke free, and to contact me if he ever had the desire to smoke again so that I can provide resources and tools to help support the effort to remain smoke free. We discussed the time and location of the scan, and that either  Doroteo Glassman RN or I will call with the results within  24-48 hours of receiving them. He has my card and contact information in the event he needs to speak with me, in addition to a copy of the power point we reviewed as a resource. He verbalized understanding of all of the above and had no further questions upon leaving the office.  I explained to the patient that there has been a high incidence of coronary artery disease noted on these  exams. I explained that this is a non-gated exam therefore degree or severity cannot be determined. This patient is currently on statin therapy. I have asked the patient to follow-up with their PCP regarding any incidental finding of coronary artery disease and management with diet or medication as they feel is clinically indicated. The patient verbalized understanding of the above and had no further questions.    Magdalen Spatz, NP 07/27/2021

## 2021-07-28 ENCOUNTER — Ambulatory Visit
Admission: RE | Admit: 2021-07-28 | Discharge: 2021-07-28 | Disposition: A | Discharge status: Home / Self Care | Payer: Medicare (Managed Care) | Source: Ambulatory Visit | Attending: Acute Care | Admitting: Acute Care

## 2021-07-28 DIAGNOSIS — Z87891 Personal history of nicotine dependence: Secondary | ICD-10-CM

## 2021-08-03 ENCOUNTER — Other Ambulatory Visit: Payer: Self-pay | Admitting: *Deleted

## 2021-08-03 DIAGNOSIS — Z87891 Personal history of nicotine dependence: Secondary | ICD-10-CM

## 2022-02-05 ENCOUNTER — Other Ambulatory Visit: Payer: Self-pay | Admitting: Family Medicine

## 2022-02-05 ENCOUNTER — Ambulatory Visit
Admission: RE | Admit: 2022-02-05 | Discharge: 2022-02-05 | Disposition: A | Discharge status: Home / Self Care | Payer: Medicare PPO | Source: Ambulatory Visit | Attending: Family Medicine | Admitting: Family Medicine

## 2022-02-05 DIAGNOSIS — J441 Chronic obstructive pulmonary disease with (acute) exacerbation: Secondary | ICD-10-CM

## 2022-02-05 DIAGNOSIS — R059 Cough, unspecified: Secondary | ICD-10-CM | POA: Diagnosis not present

## 2022-02-05 DIAGNOSIS — I10 Essential (primary) hypertension: Secondary | ICD-10-CM | POA: Diagnosis not present

## 2022-02-05 DIAGNOSIS — K219 Gastro-esophageal reflux disease without esophagitis: Secondary | ICD-10-CM | POA: Diagnosis not present

## 2022-07-06 DIAGNOSIS — R454 Irritability and anger: Secondary | ICD-10-CM | POA: Diagnosis not present

## 2022-07-06 DIAGNOSIS — E7800 Pure hypercholesterolemia, unspecified: Secondary | ICD-10-CM | POA: Diagnosis not present

## 2022-07-06 DIAGNOSIS — Z1331 Encounter for screening for depression: Secondary | ICD-10-CM | POA: Diagnosis not present

## 2022-07-06 DIAGNOSIS — I10 Essential (primary) hypertension: Secondary | ICD-10-CM | POA: Diagnosis not present

## 2022-07-06 DIAGNOSIS — K219 Gastro-esophageal reflux disease without esophagitis: Secondary | ICD-10-CM | POA: Diagnosis not present

## 2022-07-06 DIAGNOSIS — J449 Chronic obstructive pulmonary disease, unspecified: Secondary | ICD-10-CM | POA: Diagnosis not present

## 2022-07-06 DIAGNOSIS — I700 Atherosclerosis of aorta: Secondary | ICD-10-CM | POA: Diagnosis not present

## 2022-07-06 DIAGNOSIS — R7303 Prediabetes: Secondary | ICD-10-CM | POA: Diagnosis not present

## 2022-07-06 DIAGNOSIS — Z0000 Encounter for general adult medical examination without abnormal findings: Secondary | ICD-10-CM | POA: Diagnosis not present

## 2022-07-27 DIAGNOSIS — R7303 Prediabetes: Secondary | ICD-10-CM | POA: Diagnosis not present

## 2022-07-27 DIAGNOSIS — E7800 Pure hypercholesterolemia, unspecified: Secondary | ICD-10-CM | POA: Diagnosis not present

## 2022-07-28 ENCOUNTER — Other Ambulatory Visit: Payer: Medicare PPO

## 2022-08-05 ENCOUNTER — Encounter (HOSPITAL_COMMUNITY): Payer: Self-pay | Admitting: Emergency Medicine

## 2022-08-05 ENCOUNTER — Inpatient Hospital Stay (HOSPITAL_COMMUNITY)
Admission: EM | Admit: 2022-08-05 | Discharge: 2022-08-19 | DRG: 085 | Disposition: A | Discharge status: Skilled Nursing Facility | Payer: Medicare HMO | Attending: General Surgery | Admitting: General Surgery

## 2022-08-05 ENCOUNTER — Emergency Department (HOSPITAL_COMMUNITY): Payer: Medicare HMO

## 2022-08-05 ENCOUNTER — Other Ambulatory Visit: Payer: Self-pay

## 2022-08-05 DIAGNOSIS — I629 Nontraumatic intracranial hemorrhage, unspecified: Secondary | ICD-10-CM

## 2022-08-05 DIAGNOSIS — S065XAA Traumatic subdural hemorrhage with loss of consciousness status unknown, initial encounter: Secondary | ICD-10-CM | POA: Diagnosis present

## 2022-08-05 DIAGNOSIS — S060X9A Concussion with loss of consciousness of unspecified duration, initial encounter: Secondary | ICD-10-CM | POA: Diagnosis not present

## 2022-08-05 DIAGNOSIS — S0232XA Fracture of orbital floor, left side, initial encounter for closed fracture: Secondary | ICD-10-CM | POA: Diagnosis present

## 2022-08-05 DIAGNOSIS — M5126 Other intervertebral disc displacement, lumbar region: Secondary | ICD-10-CM | POA: Diagnosis not present

## 2022-08-05 DIAGNOSIS — Z87891 Personal history of nicotine dependence: Secondary | ICD-10-CM

## 2022-08-05 DIAGNOSIS — S0240FA Zygomatic fracture, left side, initial encounter for closed fracture: Secondary | ICD-10-CM | POA: Diagnosis not present

## 2022-08-05 DIAGNOSIS — E669 Obesity, unspecified: Secondary | ICD-10-CM | POA: Diagnosis present

## 2022-08-05 DIAGNOSIS — R531 Weakness: Secondary | ICD-10-CM | POA: Diagnosis not present

## 2022-08-05 DIAGNOSIS — K4090 Unilateral inguinal hernia, without obstruction or gangrene, not specified as recurrent: Secondary | ICD-10-CM | POA: Diagnosis present

## 2022-08-05 DIAGNOSIS — Z818 Family history of other mental and behavioral disorders: Secondary | ICD-10-CM

## 2022-08-05 DIAGNOSIS — S2241XA Multiple fractures of ribs, right side, initial encounter for closed fracture: Secondary | ICD-10-CM | POA: Diagnosis not present

## 2022-08-05 DIAGNOSIS — S2222XA Fracture of body of sternum, initial encounter for closed fracture: Secondary | ICD-10-CM | POA: Diagnosis present

## 2022-08-05 DIAGNOSIS — S0231XA Fracture of orbital floor, right side, initial encounter for closed fracture: Secondary | ICD-10-CM | POA: Diagnosis not present

## 2022-08-05 DIAGNOSIS — R058 Other specified cough: Secondary | ICD-10-CM | POA: Diagnosis not present

## 2022-08-05 DIAGNOSIS — I4510 Unspecified right bundle-branch block: Secondary | ICD-10-CM | POA: Diagnosis present

## 2022-08-05 DIAGNOSIS — S02400A Malar fracture unspecified, initial encounter for closed fracture: Secondary | ICD-10-CM | POA: Diagnosis not present

## 2022-08-05 DIAGNOSIS — S022XXA Fracture of nasal bones, initial encounter for closed fracture: Secondary | ICD-10-CM | POA: Diagnosis present

## 2022-08-05 DIAGNOSIS — Z23 Encounter for immunization: Secondary | ICD-10-CM

## 2022-08-05 DIAGNOSIS — S2220XA Unspecified fracture of sternum, initial encounter for closed fracture: Secondary | ICD-10-CM | POA: Diagnosis present

## 2022-08-05 DIAGNOSIS — I4891 Unspecified atrial fibrillation: Secondary | ICD-10-CM | POA: Diagnosis not present

## 2022-08-05 DIAGNOSIS — R402252 Coma scale, best verbal response, oriented, at arrival to emergency department: Secondary | ICD-10-CM | POA: Diagnosis present

## 2022-08-05 DIAGNOSIS — S0636AA Traumatic hemorrhage of cerebrum, unspecified, with loss of consciousness status unknown, initial encounter: Secondary | ICD-10-CM | POA: Diagnosis present

## 2022-08-05 DIAGNOSIS — R7881 Bacteremia: Secondary | ICD-10-CM | POA: Diagnosis not present

## 2022-08-05 DIAGNOSIS — I10 Essential (primary) hypertension: Secondary | ICD-10-CM | POA: Diagnosis not present

## 2022-08-05 DIAGNOSIS — R402142 Coma scale, eyes open, spontaneous, at arrival to emergency department: Secondary | ICD-10-CM | POA: Diagnosis present

## 2022-08-05 DIAGNOSIS — R21 Rash and other nonspecific skin eruption: Secondary | ICD-10-CM | POA: Diagnosis present

## 2022-08-05 DIAGNOSIS — H0520 Unspecified exophthalmos: Secondary | ICD-10-CM | POA: Diagnosis not present

## 2022-08-05 DIAGNOSIS — R58 Hemorrhage, not elsewhere classified: Secondary | ICD-10-CM | POA: Diagnosis not present

## 2022-08-05 DIAGNOSIS — S065X1A Traumatic subdural hemorrhage with loss of consciousness of 30 minutes or less, initial encounter: Secondary | ICD-10-CM | POA: Diagnosis not present

## 2022-08-05 DIAGNOSIS — Z8249 Family history of ischemic heart disease and other diseases of the circulatory system: Secondary | ICD-10-CM

## 2022-08-05 DIAGNOSIS — S0993XA Unspecified injury of face, initial encounter: Secondary | ICD-10-CM | POA: Diagnosis not present

## 2022-08-05 DIAGNOSIS — Z888 Allergy status to other drugs, medicaments and biological substances status: Secondary | ICD-10-CM

## 2022-08-05 DIAGNOSIS — N281 Cyst of kidney, acquired: Secondary | ICD-10-CM | POA: Diagnosis not present

## 2022-08-05 DIAGNOSIS — A4151 Sepsis due to Escherichia coli [E. coli]: Secondary | ICD-10-CM | POA: Diagnosis not present

## 2022-08-05 DIAGNOSIS — S0181XA Laceration without foreign body of other part of head, initial encounter: Secondary | ICD-10-CM | POA: Diagnosis present

## 2022-08-05 DIAGNOSIS — Z7401 Bed confinement status: Secondary | ICD-10-CM | POA: Diagnosis not present

## 2022-08-05 DIAGNOSIS — R609 Edema, unspecified: Secondary | ICD-10-CM | POA: Diagnosis not present

## 2022-08-05 DIAGNOSIS — S01412A Laceration without foreign body of left cheek and temporomandibular area, initial encounter: Secondary | ICD-10-CM | POA: Diagnosis present

## 2022-08-05 DIAGNOSIS — H05239 Hemorrhage of unspecified orbit: Secondary | ICD-10-CM | POA: Diagnosis present

## 2022-08-05 DIAGNOSIS — J449 Chronic obstructive pulmonary disease, unspecified: Secondary | ICD-10-CM | POA: Diagnosis present

## 2022-08-05 DIAGNOSIS — J9811 Atelectasis: Secondary | ICD-10-CM | POA: Diagnosis not present

## 2022-08-05 DIAGNOSIS — I491 Atrial premature depolarization: Secondary | ICD-10-CM | POA: Diagnosis present

## 2022-08-05 DIAGNOSIS — S0240DA Maxillary fracture, left side, initial encounter for closed fracture: Secondary | ICD-10-CM | POA: Diagnosis not present

## 2022-08-05 DIAGNOSIS — S06369A Traumatic hemorrhage of cerebrum, unspecified, with loss of consciousness of unspecified duration, initial encounter: Secondary | ICD-10-CM | POA: Diagnosis not present

## 2022-08-05 DIAGNOSIS — R339 Retention of urine, unspecified: Secondary | ICD-10-CM | POA: Diagnosis not present

## 2022-08-05 DIAGNOSIS — S01112A Laceration without foreign body of left eyelid and periocular area, initial encounter: Secondary | ICD-10-CM | POA: Diagnosis not present

## 2022-08-05 DIAGNOSIS — Z4789 Encounter for other orthopedic aftercare: Secondary | ICD-10-CM | POA: Diagnosis not present

## 2022-08-05 DIAGNOSIS — Z7982 Long term (current) use of aspirin: Secondary | ICD-10-CM

## 2022-08-05 DIAGNOSIS — N509 Disorder of male genital organs, unspecified: Secondary | ICD-10-CM | POA: Diagnosis present

## 2022-08-05 DIAGNOSIS — R509 Fever, unspecified: Secondary | ICD-10-CM | POA: Diagnosis not present

## 2022-08-05 DIAGNOSIS — H05232 Hemorrhage of left orbit: Secondary | ICD-10-CM | POA: Diagnosis not present

## 2022-08-05 DIAGNOSIS — Y92410 Unspecified street and highway as the place of occurrence of the external cause: Secondary | ICD-10-CM

## 2022-08-05 DIAGNOSIS — I700 Atherosclerosis of aorta: Secondary | ICD-10-CM | POA: Diagnosis not present

## 2022-08-05 DIAGNOSIS — M5134 Other intervertebral disc degeneration, thoracic region: Secondary | ICD-10-CM | POA: Diagnosis not present

## 2022-08-05 DIAGNOSIS — I739 Peripheral vascular disease, unspecified: Secondary | ICD-10-CM | POA: Diagnosis present

## 2022-08-05 DIAGNOSIS — S199XXA Unspecified injury of neck, initial encounter: Secondary | ICD-10-CM | POA: Diagnosis not present

## 2022-08-05 DIAGNOSIS — R402362 Coma scale, best motor response, obeys commands, at arrival to emergency department: Secondary | ICD-10-CM | POA: Diagnosis present

## 2022-08-05 DIAGNOSIS — H532 Diplopia: Secondary | ICD-10-CM | POA: Diagnosis present

## 2022-08-05 DIAGNOSIS — N390 Urinary tract infection, site not specified: Secondary | ICD-10-CM | POA: Diagnosis not present

## 2022-08-05 DIAGNOSIS — Z041 Encounter for examination and observation following transport accident: Secondary | ICD-10-CM | POA: Diagnosis not present

## 2022-08-05 DIAGNOSIS — R0789 Other chest pain: Secondary | ICD-10-CM | POA: Diagnosis not present

## 2022-08-05 DIAGNOSIS — R911 Solitary pulmonary nodule: Secondary | ICD-10-CM | POA: Diagnosis not present

## 2022-08-05 DIAGNOSIS — Z7951 Long term (current) use of inhaled steroids: Secondary | ICD-10-CM

## 2022-08-05 DIAGNOSIS — Z1152 Encounter for screening for COVID-19: Secondary | ICD-10-CM

## 2022-08-05 DIAGNOSIS — Z809 Family history of malignant neoplasm, unspecified: Secondary | ICD-10-CM

## 2022-08-05 DIAGNOSIS — S0285XA Fracture of orbit, unspecified, initial encounter for closed fracture: Secondary | ICD-10-CM | POA: Diagnosis not present

## 2022-08-05 DIAGNOSIS — S065X9A Traumatic subdural hemorrhage with loss of consciousness of unspecified duration, initial encounter: Secondary | ICD-10-CM | POA: Diagnosis not present

## 2022-08-05 DIAGNOSIS — R55 Syncope and collapse: Secondary | ICD-10-CM | POA: Diagnosis not present

## 2022-08-05 DIAGNOSIS — Z6833 Body mass index (BMI) 33.0-33.9, adult: Secondary | ICD-10-CM

## 2022-08-05 DIAGNOSIS — Z79899 Other long term (current) drug therapy: Secondary | ICD-10-CM

## 2022-08-05 DIAGNOSIS — I517 Cardiomegaly: Secondary | ICD-10-CM | POA: Diagnosis not present

## 2022-08-05 DIAGNOSIS — S299XXA Unspecified injury of thorax, initial encounter: Secondary | ICD-10-CM | POA: Diagnosis not present

## 2022-08-05 LAB — COMPREHENSIVE METABOLIC PANEL
ALT: 29 U/L (ref 0–44)
AST: 34 U/L (ref 15–41)
Albumin: 3.6 g/dL (ref 3.5–5.0)
Alkaline Phosphatase: 66 U/L (ref 38–126)
Anion gap: 9 (ref 5–15)
BUN: 13 mg/dL (ref 8–23)
CO2: 22 mmol/L (ref 22–32)
Calcium: 9.2 mg/dL (ref 8.9–10.3)
Chloride: 107 mmol/L (ref 98–111)
Creatinine, Ser: 1 mg/dL (ref 0.61–1.24)
GFR, Estimated: >60 mL/min (ref >60)
Glucose, Bld: 136 mg/dL — ABNORMAL HIGH (ref 70–99)
Potassium: 4.5 mmol/L (ref 3.5–5.1)
Sodium: 138 mmol/L (ref 135–145)
Total Bilirubin: 1.3 mg/dL — ABNORMAL HIGH (ref 0.3–1.2)
Total Protein: 6.4 g/dL — ABNORMAL LOW (ref 6.5–8.1)

## 2022-08-05 LAB — ETHANOL: Alcohol, Ethyl (B): <10 mg/dL (ref <10)

## 2022-08-05 LAB — CBC
HCT: 47.9 % (ref 39.0–52.0)
Hemoglobin: 16.1 g/dL (ref 13.0–17.0)
MCH: 33.3 pg (ref 26.0–34.0)
MCHC: 33.6 g/dL (ref 30.0–36.0)
MCV: 99 fL (ref 80.0–100.0)
Platelets: 232 10*3/uL (ref 150–400)
RBC: 4.84 MIL/uL (ref 4.22–5.81)
RDW: 12.9 % (ref 11.5–15.5)
WBC: 9.7 10*3/uL (ref 4.0–10.5)
nRBC: 0 % (ref 0.0–0.2)

## 2022-08-05 LAB — I-STAT CHEM 8, ED
BUN: 14 mg/dL (ref 8–23)
Calcium, Ion: 1.1 mmol/L — ABNORMAL LOW (ref 1.15–1.40)
Chloride: 106 mmol/L (ref 98–111)
Creatinine, Ser: 0.9 mg/dL (ref 0.61–1.24)
Glucose, Bld: 129 mg/dL — ABNORMAL HIGH (ref 70–99)
HCT: 48 % (ref 39.0–52.0)
Hemoglobin: 16.3 g/dL (ref 13.0–17.0)
Potassium: 4.5 mmol/L (ref 3.5–5.1)
Sodium: 140 mmol/L (ref 135–145)
TCO2: 22 mmol/L (ref 22–32)

## 2022-08-05 LAB — PROTIME-INR
INR: 1.2 (ref 0.8–1.2)
Prothrombin Time: 15.4 seconds — ABNORMAL HIGH (ref 11.4–15.2)

## 2022-08-05 LAB — SAMPLE TO BLOOD BANK

## 2022-08-05 LAB — LACTIC ACID, PLASMA: Lactic Acid, Venous: 2 mmol/L (ref 0.5–1.9)

## 2022-08-05 LAB — TROPONIN I (HIGH SENSITIVITY)
Troponin I (High Sensitivity): 6 ng/L (ref <18)
Troponin I (High Sensitivity): 5 ng/L (ref <18)

## 2022-08-05 LAB — RESP PANEL BY RT-PCR (FLU A&B, COVID) ARPGX2
Influenza A by PCR: NEGATIVE
Influenza B by PCR: NEGATIVE
SARS Coronavirus 2 by RT PCR: NEGATIVE

## 2022-08-05 MED ORDER — ONDANSETRON HCL 4 MG/2ML IJ SOLN: 4.0000 mg | Freq: Four times a day (QID) | INTRAMUSCULAR | Status: DC | PRN

## 2022-08-05 MED ORDER — ALBUTEROL SULFATE (2.5 MG/3ML) 0.083% IN NEBU: 3.0000 mL | INHALATION_SOLUTION | RESPIRATORY_TRACT | Status: DC | PRN

## 2022-08-05 MED ORDER — IRBESARTAN 150 MG PO TABS
150.0000 mg | ORAL_TABLET | Freq: Every day | ORAL | Status: DC
  Administered 2022-08-06 – 2022-08-19 (×12): 150 mg via ORAL
  Filled 2022-08-05 (×14): qty 1

## 2022-08-05 MED ORDER — TETRACAINE HCL 0.5 % OP SOLN
2.0000 [drp] | Freq: Once | OPHTHALMIC | Status: AC
  Administered 2022-08-05: 2 [drp] via OPHTHALMIC

## 2022-08-05 MED ORDER — IOHEXOL 350 MG/ML SOLN
100.0000 mL | Freq: Once | INTRAVENOUS | Status: AC | PRN
  Administered 2022-08-05: 75 mL via INTRAVENOUS

## 2022-08-05 MED ORDER — FLUTICASONE FUROATE-VILANTEROL 200-25 MCG/ACT IN AEPB
1.0000 | INHALATION_SPRAY | Freq: Every day | RESPIRATORY_TRACT | Status: DC
  Administered 2022-08-06 – 2022-08-19 (×14): 1 via RESPIRATORY_TRACT
  Filled 2022-08-05 (×2): qty 28

## 2022-08-05 MED ORDER — POLYETHYLENE GLYCOL 3350 17 G PO PACK
17.0000 g | PACK | Freq: Every day | ORAL | Status: DC | PRN
  Administered 2022-08-15 (×2): 17 g via ORAL
  Filled 2022-08-05 (×2): qty 1

## 2022-08-05 MED ORDER — PANTOPRAZOLE SODIUM 40 MG IV SOLR
40.0000 mg | Freq: Every day | INTRAVENOUS | Status: DC
  Administered 2022-08-05: 40 mg via INTRAVENOUS
  Filled 2022-08-05 (×2): qty 10

## 2022-08-05 MED ORDER — PANTOPRAZOLE SODIUM 40 MG PO TBEC
40.0000 mg | DELAYED_RELEASE_TABLET | Freq: Every day | ORAL | Status: DC
  Administered 2022-08-06 – 2022-08-19 (×14): 40 mg via ORAL
  Filled 2022-08-05 (×13): qty 1

## 2022-08-05 MED ORDER — LIDOCAINE HCL (PF) 1 % IJ SOLN
10.0000 mL | Freq: Once | INTRAMUSCULAR | Status: AC
  Administered 2022-08-05: 10 mL via INTRADERMAL
  Filled 2022-08-05: qty 10

## 2022-08-05 MED ORDER — MORPHINE SULFATE (PF) 4 MG/ML IV SOLN
4.0000 mg | Freq: Once | INTRAVENOUS | Status: AC
  Administered 2022-08-05: 4 mg via INTRAVENOUS
  Filled 2022-08-05: qty 1

## 2022-08-05 MED ORDER — PROCHLORPERAZINE EDISYLATE 10 MG/2ML IJ SOLN: 5.0000 mg | Freq: Four times a day (QID) | INTRAMUSCULAR | Status: DC | PRN

## 2022-08-05 MED ORDER — UMECLIDINIUM BROMIDE 62.5 MCG/ACT IN AEPB
1.0000 | INHALATION_SPRAY | Freq: Every day | RESPIRATORY_TRACT | Status: DC
  Administered 2022-08-06 – 2022-08-19 (×14): 1 via RESPIRATORY_TRACT
  Filled 2022-08-05 (×2): qty 7

## 2022-08-05 MED ORDER — LIDOCAINE-EPINEPHRINE (PF) 2 %-1:200000 IJ SOLN
INTRAMUSCULAR | Status: AC
  Filled 2022-08-05: qty 20

## 2022-08-05 MED ORDER — SODIUM CHLORIDE 0.9 % IV SOLN: INTRAVENOUS | Status: DC

## 2022-08-05 MED ORDER — DIPHENHYDRAMINE HCL 50 MG/ML IJ SOLN
12.5000 mg | Freq: Four times a day (QID) | INTRAMUSCULAR | Status: DC | PRN
  Administered 2022-08-13: 12.5 mg via INTRAVENOUS
  Filled 2022-08-05: qty 1

## 2022-08-05 MED ORDER — ACETAMINOPHEN 325 MG PO TABS
650.0000 mg | ORAL_TABLET | Freq: Four times a day (QID) | ORAL | Status: DC
  Administered 2022-08-05 – 2022-08-19 (×54): 650 mg via ORAL
  Filled 2022-08-05 (×55): qty 2

## 2022-08-05 MED ORDER — CITALOPRAM HYDROBROMIDE 20 MG PO TABS
20.0000 mg | ORAL_TABLET | Freq: Every day | ORAL | Status: DC
  Administered 2022-08-06 – 2022-08-19 (×14): 20 mg via ORAL
  Filled 2022-08-05 (×14): qty 1

## 2022-08-05 MED ORDER — CEFAZOLIN SODIUM-DEXTROSE 2-4 GM/100ML-% IV SOLN
2.0000 g | Freq: Once | INTRAVENOUS | Status: AC
  Administered 2022-08-05: 2 g via INTRAVENOUS
  Filled 2022-08-05: qty 100

## 2022-08-05 MED ORDER — TETANUS-DIPHTH-ACELL PERTUSSIS 5-2.5-18.5 LF-MCG/0.5 IM SUSY
0.5000 mL | PREFILLED_SYRINGE | Freq: Once | INTRAMUSCULAR | Status: AC
  Administered 2022-08-05: 0.5 mL via INTRAMUSCULAR
  Filled 2022-08-05: qty 0.5

## 2022-08-05 MED ORDER — METHOCARBAMOL 1000 MG/10ML IJ SOLN: 500.0000 mg | Freq: Three times a day (TID) | INTRAVENOUS | Status: DC | PRN

## 2022-08-05 MED ORDER — PROCHLORPERAZINE MALEATE 10 MG PO TABS: 10.0000 mg | ORAL_TABLET | Freq: Four times a day (QID) | ORAL | Status: DC | PRN

## 2022-08-05 MED ORDER — ONDANSETRON 4 MG PO TBDP: 4.0000 mg | ORAL_TABLET | Freq: Four times a day (QID) | ORAL | Status: DC | PRN

## 2022-08-05 MED ORDER — HYDRALAZINE HCL 20 MG/ML IJ SOLN
10.0000 mg | INTRAMUSCULAR | Status: DC | PRN
  Administered 2022-08-14: 10 mg via INTRAVENOUS
  Filled 2022-08-05: qty 1

## 2022-08-05 MED ORDER — MORPHINE SULFATE (PF) 2 MG/ML IV SOLN
2.0000 mg | INTRAVENOUS | Status: DC | PRN
  Administered 2022-08-14: 2 mg via INTRAVENOUS
  Administered 2022-08-14: 4 mg via INTRAVENOUS
  Administered 2022-08-14 – 2022-08-16 (×2): 2 mg via INTRAVENOUS
  Filled 2022-08-05 (×3): qty 1
  Filled 2022-08-05: qty 2

## 2022-08-05 MED ORDER — OXYCODONE HCL 5 MG PO TABS
2.5000 mg | ORAL_TABLET | ORAL | Status: DC | PRN
  Administered 2022-08-05 – 2022-08-06 (×2): 5 mg via ORAL
  Administered 2022-08-06: 2.5 mg via ORAL
  Administered 2022-08-07: 5 mg via ORAL
  Administered 2022-08-07: 2.5 mg via ORAL
  Administered 2022-08-08: 5 mg via ORAL
  Administered 2022-08-08 – 2022-08-09 (×2): 2.5 mg via ORAL
  Administered 2022-08-10 – 2022-08-18 (×18): 5 mg via ORAL
  Filled 2022-08-05 (×28): qty 1

## 2022-08-05 MED ORDER — FLUORESCEIN SODIUM 1 MG OP STRP
1.0000 | ORAL_STRIP | Freq: Once | OPHTHALMIC | Status: AC
  Administered 2022-08-05: 1 via OPHTHALMIC
  Filled 2022-08-05: qty 1

## 2022-08-05 MED ORDER — FENTANYL CITRATE PF 50 MCG/ML IJ SOSY
50.0000 ug | PREFILLED_SYRINGE | Freq: Once | INTRAMUSCULAR | Status: AC
  Administered 2022-08-05: 50 ug via INTRAVENOUS
  Filled 2022-08-05: qty 1

## 2022-08-05 MED ORDER — LEVETIRACETAM IN NACL 500 MG/100ML IV SOLN
500.0000 mg | Freq: Two times a day (BID) | INTRAVENOUS | Status: DC
  Administered 2022-08-05 – 2022-08-09 (×8): 500 mg via INTRAVENOUS
  Filled 2022-08-05 (×9): qty 100

## 2022-08-05 MED ORDER — DOCUSATE SODIUM 100 MG PO CAPS
100.0000 mg | ORAL_CAPSULE | Freq: Two times a day (BID) | ORAL | Status: DC
  Administered 2022-08-05 – 2022-08-19 (×23): 100 mg via ORAL
  Filled 2022-08-05 (×25): qty 1

## 2022-08-05 MED ORDER — LIDOCAINE 5 % EX PTCH
1.0000 | MEDICATED_PATCH | CUTANEOUS | Status: DC
  Administered 2022-08-06 – 2022-08-19 (×14): 1 via TRANSDERMAL
  Filled 2022-08-05 (×15): qty 1

## 2022-08-05 MED ORDER — METHOCARBAMOL 500 MG PO TABS
500.0000 mg | ORAL_TABLET | Freq: Three times a day (TID) | ORAL | Status: DC | PRN
  Administered 2022-08-08 – 2022-08-18 (×12): 500 mg via ORAL
  Filled 2022-08-05 (×14): qty 1
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
## 2022-08-05 NOTE — ED Notes (Signed)
Transition of Care [facility] [place]) - CAGE-AID Screening   Patient Details  Name: [name] MRN: [id number] Date of Birth: [birth year]   Elvina Sidle, RN Trauma Response Nurse Phone Number: [phone] 08/05/2022, 7:31 PM   CAGE-AID Screening:    Have You Ever Felt You Ought to Cut Down on Your Drinking or Drug Use?: No Have People Annoyed You By Critizing Your Drinking Or Drug Use?: No Have You Felt Bad Or Guilty About Your Drinking Or Drug Use?: No Have You Ever Had a Drink or Used Drugs First Thing In The Morning to Steady Your Nerves or to Get Rid of a Hangover?: No CAGE-AID Score: 0  Substance Abuse Education Offered: No

## 2022-08-05 NOTE — ED Notes (Signed)
Pt has three lacerations to upper lid of left eye.  He also has a laceration to left cheek.  MD Trifan aware.  No new orders at this time.
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
## 2022-08-05 NOTE — ED Notes (Signed)
Patient transported to CT 1 with Rolene Arbour, TRN

## 2022-08-05 NOTE — Procedures (Signed)
Procedure note:  Pre-op diagnosis: Multiple facial lacerations Post-op diagnosis: Multiple facial lacerations Indications: Deep facial lacerations  EBL: Minimal Specimen: None Complications: None  Technique: After RBAI discussed the patient was prepped in the typical ophthalmic fashion and local administered.  A single suture was placed in the lateral temple to close the brow laceration further. Attention was then turned to the superior lid laceration. After irrigation, 6-0 vicyrl sutures were used to close the wound in interrupted fashion. Care was taken to stay superficial to the exposed tarsal plate. A single suture was also placed in a small laceration int the lower lid.   The patient tolerated the procedure well and I recommend erythromycin tid until follow up.

## 2022-08-05 NOTE — ED Notes (Signed)
Critical lactic acid 2.0.  MD Trifan notified.  No new orders at this time.
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
## 2022-08-05 NOTE — ED Notes (Signed)
Ophthalmology at bedside

## 2022-08-05 NOTE — ED Notes (Signed)
Ophthalmology here at bedside
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
## 2022-08-06 ENCOUNTER — Inpatient Hospital Stay (HOSPITAL_COMMUNITY): Payer: Medicare HMO

## 2022-08-06 ENCOUNTER — Institutional Professional Consult (permissible substitution): Payer: Medicare HMO | Admitting: Internal Medicine

## 2022-08-06 LAB — BASIC METABOLIC PANEL
Anion gap: 6 (ref 5–15)
BUN: 15 mg/dL (ref 8–23)
CO2: 24 mmol/L (ref 22–32)
Calcium: 8.3 mg/dL — ABNORMAL LOW (ref 8.9–10.3)
Chloride: 108 mmol/L (ref 98–111)
Creatinine, Ser: 0.93 mg/dL (ref 0.61–1.24)
GFR, Estimated: >60 mL/min (ref >60)
Glucose, Bld: 107 mg/dL — ABNORMAL HIGH (ref 70–99)
Potassium: 4.6 mmol/L (ref 3.5–5.1)
Sodium: 138 mmol/L (ref 135–145)

## 2022-08-06 LAB — CBC
HCT: 43.8 % (ref 39.0–52.0)
Hemoglobin: 14.4 g/dL (ref 13.0–17.0)
MCH: 32.8 pg (ref 26.0–34.0)
MCHC: 32.9 g/dL (ref 30.0–36.0)
MCV: 99.8 fL (ref 80.0–100.0)
Platelets: 206 10*3/uL (ref 150–400)
RBC: 4.39 MIL/uL (ref 4.22–5.81)
RDW: 13.1 % (ref 11.5–15.5)
WBC: 12.4 10*3/uL — ABNORMAL HIGH (ref 4.0–10.5)
nRBC: 0 % (ref 0.0–0.2)

## 2022-08-06 LAB — ECHOCARDIOGRAM COMPLETE
AR max vel: 2.69 cm2
AV Area VTI: 3.5 cm2
AV Area mean vel: 2.45 cm2
AV Mean grad: 3 mmHg
AV Peak grad: 6.2 mmHg
Ao pk vel: 1.24 m/s
Area-P 1/2: 4.49 cm2
Height: 72 in
S' Lateral: 2.7 cm
Weight: 3920 oz

## 2022-08-06 LAB — BLOOD GAS, ARTERIAL
Acid-base deficit: 1.6 mmol/L (ref 0.0–2.0)
Bicarbonate: 21.5 mmol/L (ref 20.0–28.0)
Drawn by: 31394
O2 Saturation: 97.3 %
Patient temperature: 36.6
pCO2 arterial: 30 mmHg — ABNORMAL LOW (ref 32–48)
pH, Arterial: 7.46 — ABNORMAL HIGH (ref 7.35–7.45)
pO2, Arterial: 77 mmHg — ABNORMAL LOW (ref 83–108)

## 2022-08-06 MED ORDER — TAMSULOSIN HCL 0.4 MG PO CAPS
0.4000 mg | ORAL_CAPSULE | Freq: Every day | ORAL | Status: DC
  Administered 2022-08-06 – 2022-08-19 (×14): 0.4 mg via ORAL
  Filled 2022-08-06 (×14): qty 1

## 2022-08-06 MED ORDER — ERYTHROMYCIN 5 MG/GM OP OINT
TOPICAL_OINTMENT | Freq: Three times a day (TID) | OPHTHALMIC | Status: DC
  Administered 2022-08-09 – 2022-08-14 (×6): 1 via OPHTHALMIC
  Filled 2022-08-06 (×2): qty 3.5

## 2022-08-06 MED ORDER — LACTATED RINGERS IV BOLUS
1000.0000 mL | Freq: Once | INTRAVENOUS | Status: AC
  Administered 2022-08-06: 1000 mL via INTRAVENOUS

## 2022-08-06 MED ORDER — SALINE SPRAY 0.65 % NA SOLN: 1.0000 | NASAL | Status: DC | PRN

## 2022-08-06 NOTE — Progress Notes (Signed)
  NEUROSURGERY PROGRESS NOTE   I have personally reviewed this case in consultation for the trauma service.  My management recommendation is for repeat CT imaging in 12 hours  If the patient is admitted to the hospital,   - from a neurosurgical perspective, this patient may be started on DVT prophylaxis with: lovenox to start 24h from stable repeat head CT  - after initiation of DVT prophylaxis, I recommend the following additional neurosurgical imaging: none  If the patient was on a therapeutic anticoagulation and/or anti-platelet therapy pre-injury, it may be resumed 72h from stable repeat head CT  Jairo Ben
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
## 2022-08-06 NOTE — Plan of Care (Signed)
  Problem: Education: Goal: Knowledge of General Education information will improve Description: Including pain rating scale, medication(s)/side effects and non-pharmacologic comfort measures Outcome: Progressing   Problem: Clinical Measurements: Goal: Ability to maintain clinical measurements within normal limits will improve Outcome: Progressing Goal: Will remain free from infection Outcome: Progressing Goal: Respiratory complications will improve Outcome: Progressing Goal: Cardiovascular complication will be avoided Outcome: Progressing   Problem: Nutrition: Goal: Adequate nutrition will be maintained Outcome: Progressing   Problem: Coping: Goal: Level of anxiety will decrease Outcome: Progressing   Problem: Pain Managment: Goal: General experience of comfort will improve Outcome: Progressing

## 2022-08-07 LAB — CBC
HCT: 41.5 % (ref 39.0–52.0)
Hemoglobin: 13.9 g/dL (ref 13.0–17.0)
MCH: 32.9 pg (ref 26.0–34.0)
MCHC: 33.5 g/dL (ref 30.0–36.0)
MCV: 98.3 fL (ref 80.0–100.0)
Platelets: 174 10*3/uL (ref 150–400)
RBC: 4.22 MIL/uL (ref 4.22–5.81)
RDW: 13 % (ref 11.5–15.5)
WBC: 8.6 10*3/uL (ref 4.0–10.5)
nRBC: 0 % (ref 0.0–0.2)

## 2022-08-07 MED ORDER — ENOXAPARIN SODIUM 30 MG/0.3ML IJ SOSY
30.0000 mg | PREFILLED_SYRINGE | Freq: Two times a day (BID) | INTRAMUSCULAR | Status: DC
  Administered 2022-08-07 – 2022-08-19 (×25): 30 mg via SUBCUTANEOUS
  Filled 2022-08-07 (×25): qty 0.3

## 2022-08-07 NOTE — Plan of Care (Signed)
°  Problem: Education: °Goal: Knowledge of General Education information will improve °Description: Including pain rating scale, medication(s)/side effects and non-pharmacologic comfort measures °Outcome: Progressing °  °Problem: Clinical Measurements: °Goal: Ability to maintain clinical measurements within normal limits will improve °Outcome: Progressing °Goal: Will remain free from infection °Outcome: Progressing °Goal: Respiratory complications will improve °Outcome: Progressing °  °Problem: Activity: °Goal: Risk for activity intolerance will decrease °Outcome: Progressing °  °Problem: Coping: °Goal: Level of anxiety will decrease °Outcome: Progressing °  °Problem: Pain Managment: °Goal: General experience of comfort will improve °Outcome: Progressing °  °
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
## 2022-08-07 NOTE — Plan of Care (Signed)
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
## 2022-08-08 LAB — CBC
HCT: 38.5 % — ABNORMAL LOW (ref 39.0–52.0)
Hemoglobin: 13.2 g/dL (ref 13.0–17.0)
MCH: 33.5 pg (ref 26.0–34.0)
MCHC: 34.3 g/dL (ref 30.0–36.0)
MCV: 97.7 fL (ref 80.0–100.0)
Platelets: 182 10*3/uL (ref 150–400)
RBC: 3.94 MIL/uL — ABNORMAL LOW (ref 4.22–5.81)
RDW: 12.7 % (ref 11.5–15.5)
WBC: 9.6 10*3/uL (ref 4.0–10.5)
nRBC: 0 % (ref 0.0–0.2)

## 2022-08-08 MED ORDER — BISACODYL 10 MG RE SUPP
10.0000 mg | Freq: Once | RECTAL | Status: AC
  Administered 2022-08-08: 10 mg via RECTAL
  Filled 2022-08-08: qty 1

## 2022-08-08 MED ORDER — MENTHOL 3 MG MT LOZG
1.0000 | LOZENGE | OROMUCOSAL | Status: DC | PRN
  Administered 2022-08-08 – 2022-08-09 (×2): 3 mg via ORAL
  Filled 2022-08-08: qty 9

## 2022-08-08 MED ORDER — CHLORHEXIDINE GLUCONATE CLOTH 2 % EX PADS
6.0000 | MEDICATED_PAD | Freq: Every day | CUTANEOUS | Status: DC
  Administered 2022-08-08 – 2022-08-19 (×11): 6 via TOPICAL
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
## 2022-08-08 NOTE — Plan of Care (Signed)
  Problem: Education: Goal: Knowledge of General Education information will improve Description: Including pain rating scale, medication(s)/side effects and non-pharmacologic comfort measures Outcome: Progressing   Problem: Clinical Measurements: Goal: Ability to maintain clinical measurements within normal limits will improve Outcome: Progressing Goal: Will remain free from infection Outcome: Progressing Goal: Diagnostic test results will improve Outcome: Progressing Goal: Cardiovascular complication will be avoided Outcome: Progressing   Problem: Activity: Goal: Risk for activity intolerance will decrease Outcome: Progressing   Problem: Nutrition: Goal: Adequate nutrition will be maintained Outcome: Progressing

## 2022-08-09 LAB — CBC
HCT: 37.9 % — ABNORMAL LOW (ref 39.0–52.0)
Hemoglobin: 13.2 g/dL (ref 13.0–17.0)
MCH: 33.7 pg (ref 26.0–34.0)
MCHC: 34.8 g/dL (ref 30.0–36.0)
MCV: 96.7 fL (ref 80.0–100.0)
Platelets: 175 10*3/uL (ref 150–400)
RBC: 3.92 MIL/uL — ABNORMAL LOW (ref 4.22–5.81)
RDW: 12.7 % (ref 11.5–15.5)
WBC: 8.9 10*3/uL (ref 4.0–10.5)
nRBC: 0 % (ref 0.0–0.2)

## 2022-08-09 MED ORDER — LEVETIRACETAM 500 MG PO TABS
500.0000 mg | ORAL_TABLET | Freq: Two times a day (BID) | ORAL | Status: AC
  Administered 2022-08-09 – 2022-08-12 (×6): 500 mg via ORAL
  Filled 2022-08-09 (×6): qty 1
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
## 2022-08-09 NOTE — Care Management Important Message (Signed)
Important Message  Patient Details  Name: Brandon [name] MRN: [id number] Date of Birth: Feb 15, 1941   Medicare Important Message Given:  Yes     Hannah Beat 08/09/2022, 3:38 PM

## 2022-08-09 NOTE — TOC Progression Note (Signed)
Transition of Care [facility]) - Progression Note    Patient Details  Name: Brandon [name] MRN: [id number] Date of Birth: [birth year]  Transition of Care [facility]) CM/SW Contact  Oren Section Cleta Alberts, RN Phone Number: 08/09/2022, 5:06 PM  Clinical Narrative:    Confirmed with patient and family that they are now preferring SNF for short term rehab over [facility] f/u.  Patient faxed out to area SNFs, per request; will provide bed offers when available.    Expected Discharge Plan: [place] Barriers to Discharge: Continued Medical Work up  Expected Discharge Plan and Services Expected Discharge Plan: [place]   Discharge Planning Services: CM Consult   Living arrangements for the past 2 months: Single Family Home                                       Social Determinants of Health (SDOH) Interventions    Readmission Risk Interventions     No data to display         Reinaldo Raddle, RN, BSN  Trauma/Neuro ICU Case Manager [phone]

## 2022-08-10 ENCOUNTER — Inpatient Hospital Stay (HOSPITAL_COMMUNITY): Payer: Medicare HMO

## 2022-08-10 LAB — CBC
HCT: 40.8 % (ref 39.0–52.0)
Hemoglobin: 13.7 g/dL (ref 13.0–17.0)
MCH: 33 pg (ref 26.0–34.0)
MCHC: 33.6 g/dL (ref 30.0–36.0)
MCV: 98.3 fL (ref 80.0–100.0)
Platelets: 203 10*3/uL (ref 150–400)
RBC: 4.15 MIL/uL — ABNORMAL LOW (ref 4.22–5.81)
RDW: 12.8 % (ref 11.5–15.5)
WBC: 8.7 10*3/uL (ref 4.0–10.5)
nRBC: 0 % (ref 0.0–0.2)

## 2022-08-10 LAB — URINALYSIS, ROUTINE W REFLEX MICROSCOPIC
Bilirubin Urine: NEGATIVE
Glucose, UA: NEGATIVE mg/dL
Ketones, ur: NEGATIVE mg/dL
Nitrite: POSITIVE — AB
Protein, ur: 100 mg/dL — AB
Specific Gravity, Urine: 1.02 (ref 1.005–1.030)
pH: 5.5 (ref 5.0–8.0)

## 2022-08-10 LAB — URINALYSIS, MICROSCOPIC (REFLEX): RBC / HPF: >50 RBC/hpf (ref 0–5)

## 2022-08-10 MED ORDER — IPRATROPIUM-ALBUTEROL 0.5-2.5 (3) MG/3ML IN SOLN
3.0000 mL | RESPIRATORY_TRACT | Status: DC | PRN
  Filled 2022-08-10: qty 3
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
## 2022-08-10 NOTE — TOC Progression Note (Signed)
Transition of Care [facility]) - Progression Note    Patient Details  Name: Brandon [name] MRN: [id number] Date of Birth: [birth year]  [facility]) CM/SW Contact  Oren Section Cleta Alberts, RN Phone Number: 08/10/2022, 1:45pm  Clinical Narrative:    SNF bed offers given to family, with Medicare ratings.  Will follow up with sister in AM regarding family choice; plan dc to SNF pending medical stability/ insurance authorization.    Expected Discharge Plan: [place] Barriers to Discharge: Continued Medical Work up  Expected Discharge Plan and Services Expected Discharge Plan: [place]   Discharge Planning Services: CM Consult   Living arrangements for the past 2 months: Single Family Home                                       Social Determinants of Health (SDOH) Interventions    Readmission Risk Interventions     No data to display         Reinaldo Raddle, RN, BSN  Trauma/Neuro ICU Case Manager [phone]
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
## 2022-08-10 NOTE — Plan of Care (Signed)
  Problem: Education: Goal: Knowledge of General Education information will improve Description: Including pain rating scale, medication(s)/side effects and non-pharmacologic comfort measures Outcome: Progressing   Problem: Health Behavior/Discharge Planning: Goal: Ability to manage health-related needs will improve Outcome: Progressing   Problem: Clinical Measurements: Goal: Respiratory complications will improve Outcome: Progressing   

## 2022-08-11 LAB — CBC
HCT: 39.8 % (ref 39.0–52.0)
Hemoglobin: 13.9 g/dL (ref 13.0–17.0)
MCH: 33.3 pg (ref 26.0–34.0)
MCHC: 34.9 g/dL (ref 30.0–36.0)
MCV: 95.4 fL (ref 80.0–100.0)
Platelets: 189 10*3/uL (ref 150–400)
RBC: 4.17 MIL/uL — ABNORMAL LOW (ref 4.22–5.81)
RDW: 13 % (ref 11.5–15.5)
WBC: 9.7 10*3/uL (ref 4.0–10.5)
nRBC: 0 % (ref 0.0–0.2)

## 2022-08-11 LAB — BASIC METABOLIC PANEL
Anion gap: 5 (ref 5–15)
BUN: 14 mg/dL (ref 8–23)
CO2: 25 mmol/L (ref 22–32)
Calcium: 8.1 mg/dL — ABNORMAL LOW (ref 8.9–10.3)
Chloride: 105 mmol/L (ref 98–111)
Creatinine, Ser: 0.76 mg/dL (ref 0.61–1.24)
GFR, Estimated: >60 mL/min (ref >60)
Glucose, Bld: 102 mg/dL — ABNORMAL HIGH (ref 70–99)
Potassium: 3.2 mmol/L — ABNORMAL LOW (ref 3.5–5.1)
Sodium: 135 mmol/L (ref 135–145)

## 2022-08-11 MED ORDER — POTASSIUM CHLORIDE CRYS ER 20 MEQ PO TBCR
40.0000 meq | EXTENDED_RELEASE_TABLET | Freq: Once | ORAL | Status: AC
  Administered 2022-08-11: 40 meq via ORAL
  Filled 2022-08-11: qty 2

## 2022-08-11 NOTE — TOC Progression Note (Addendum)
Transition of Care [facility]) - Progression Note    Patient Details  Name: Brandon [name] MRN: [id number] Date of Birth: 02/25/1941  Transition of [facility]) CM/SW Contact  Oren Section Cleta Alberts, RN Phone Number: 08/11/2022, 2:14 PM  Clinical Narrative:    Family accepting of bed offer at [facility]; left message for [employer] in admissions at facility.    Addendum: 2:14pm Spoke with [employer] in admissions at [place]; she will start insurance authorization.  Hopeful for discharge tomorrow pending insurance auth, bed availability, and medical stability.   Expected Discharge Plan: [place] Barriers to Discharge: Continued Medical Work up  Expected Discharge Plan and Services Expected Discharge Plan: [place]   Discharge Planning Services: CM Consult   Living arrangements for the past 2 months: Single Family Home                                       Social Determinants of Health (SDOH) Interventions    Readmission Risk Interventions     No data to display         Reinaldo Raddle, RN, BSN  Trauma/Neuro ICU Case Manager [phone]
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
## 2022-08-12 DIAGNOSIS — S0636AA Traumatic hemorrhage of cerebrum, unspecified, with loss of consciousness status unknown, initial encounter: Secondary | ICD-10-CM | POA: Diagnosis present

## 2022-08-12 DIAGNOSIS — S0285XA Fracture of orbit, unspecified, initial encounter for closed fracture: Secondary | ICD-10-CM | POA: Diagnosis present

## 2022-08-12 DIAGNOSIS — H05239 Hemorrhage of unspecified orbit: Secondary | ICD-10-CM | POA: Diagnosis present

## 2022-08-12 DIAGNOSIS — S2220XA Unspecified fracture of sternum, initial encounter for closed fracture: Secondary | ICD-10-CM | POA: Diagnosis present

## 2022-08-12 DIAGNOSIS — S022XXA Fracture of nasal bones, initial encounter for closed fracture: Secondary | ICD-10-CM | POA: Diagnosis present

## 2022-08-12 DIAGNOSIS — S2241XA Multiple fractures of ribs, right side, initial encounter for closed fracture: Secondary | ICD-10-CM | POA: Diagnosis present
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
## 2022-08-12 NOTE — Plan of Care (Signed)
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
## 2022-08-12 NOTE — Care Management Important Message (Signed)
Important Message  Patient Details  Name: Brandon [name] MRN: [id number] Date of Birth: [birth year]   Medicare Important Message Given:  Yes     Hannah Beat 08/12/2022, 12:31 PM
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
## 2022-08-12 NOTE — TOC Progression Note (Addendum)
Transition of Care [facility]) - Progression Note    Patient Details  Name: Brandon [name] MRN: [id number] Date of Birth: April 27, 1941  Transition of Care [facility]) CM/SW Contact  Oren Section Cleta Alberts, RN Phone Number: 08/12/2022, 11:45am  Clinical Narrative:    Left messaged for Crystal Boyd Kerbs in admissions, requesting update on insurance auth.   12:27pm Addendum: Authorization is still pending, per Crystal in admissions at facility.   Addendum: 3:56pm Authorization remains pending per Crystal at [place].    Expected Discharge Plan: [place] Barriers to Discharge: Continued Medical Work up  Expected Discharge Plan and Services Expected Discharge Plan: [place]   Discharge Planning Services: CM Consult   Living arrangements for the past 2 months: Single Family Home                                       Social Determinants of Health (SDOH) Interventions    Readmission Risk Interventions     No data to display         Reinaldo Raddle, RN, BSN  Trauma/Neuro ICU Case Manager [phone]
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
## 2022-08-13 NOTE — Progress Notes (Signed)
PT Cancellation Note  Patient Details Name: Brandon [name] MRN: [id number] DOB: [birth year]   Cancelled Treatment:    Reason Eval/Treat Not Completed: Fatigue/lethargy limiting ability to participate. Pt sleeping on arrival. Pt politely declining PT due to fatigue. States he has been up all morning and just recently returned to bed. Pt asleep again before therapist exited room.   Lorriane Shire 08/13/2022, 10:50 AM
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
## 2022-08-13 NOTE — TOC Progression Note (Addendum)
Transition of Care [facility]) - Progression Note    Patient Details  Name: Brandon [name] MRN: [id number] Date of Birth: [birth year]  [facility] CM/SW Contact  Oren Section Cleta Alberts, RN Phone Number: 08/13/2022, 12:00 PM  Clinical Narrative:    Authorization still pending for SNF, per Crystal at [facility].  Updated patient's wife and sister; will follow with updates as available.   Addendum: 2:00-4:00pm Left multiple messages and texts for Crystal in admissions at [facility], requesting update on insurance auth, with no response.  Able to speak with another admissions representative, who stated she would email her and ask her to call Case Manager.  Awaiting call back.   Expected Discharge Plan: [place] Barriers to Discharge: Continued Medical Work up  Expected Discharge Plan and Services Expected Discharge Plan: [place]   Discharge Planning Services: CM Consult   Living arrangements for the past 2 months: Single Family Home                                       Social Determinants of Health (SDOH) Interventions    Readmission Risk Interventions     No data to display         Reinaldo Raddle, RN, BSN  Trauma/Neuro ICU Case Manager [phone]

## 2022-08-14 ENCOUNTER — Inpatient Hospital Stay (HOSPITAL_COMMUNITY): Payer: Medicare HMO

## 2022-08-14 MED ORDER — METOPROLOL TARTRATE 5 MG/5ML IV SOLN
INTRAVENOUS | Status: AC
  Administered 2022-08-14: 5 mg via INTRAVENOUS
  Filled 2022-08-14: qty 5

## 2022-08-14 MED ORDER — FUROSEMIDE 10 MG/ML IJ SOLN: 40.0000 mg | Freq: Once | INTRAMUSCULAR | Status: AC

## 2022-08-14 MED ORDER — SODIUM CHLORIDE 0.9 % IV SOLN: 1.0000 g | INTRAVENOUS | Status: DC

## 2022-08-14 MED ORDER — FUROSEMIDE 10 MG/ML IJ SOLN
INTRAMUSCULAR | Status: AC
  Administered 2022-08-14: 40 mg via INTRAVENOUS
  Filled 2022-08-14: qty 4

## 2022-08-14 MED ORDER — METOPROLOL TARTRATE 5 MG/5ML IV SOLN
2.5000 mg | Freq: Once | INTRAVENOUS | Status: AC
  Administered 2022-08-14: 2.5 mg via INTRAVENOUS
  Filled 2022-08-14: qty 5

## 2022-08-14 MED ORDER — METOPROLOL TARTRATE 5 MG/5ML IV SOLN: 5.0000 mg | Freq: Once | INTRAVENOUS | Status: AC

## 2022-08-14 MED ORDER — SODIUM CHLORIDE 0.9 % IV SOLN
2.0000 g | Freq: Two times a day (BID) | INTRAVENOUS | Status: DC
  Administered 2022-08-14: 2 g via INTRAVENOUS
  Filled 2022-08-14 (×2): qty 12.5

## 2022-08-14 MED ORDER — LEVALBUTEROL HCL 0.63 MG/3ML IN NEBU
0.6300 mg | INHALATION_SOLUTION | Freq: Four times a day (QID) | RESPIRATORY_TRACT | Status: DC | PRN
  Filled 2022-08-14: qty 3
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
## 2022-08-14 NOTE — Progress Notes (Signed)
Sister reported pt shivering. V/S taken and Rapid nurse called for consult. Dr. Zenia Resides, Trauma notified. V/s repeated. Stated she would put in orders. Safety precautions maintained.

## 2022-08-14 NOTE — Plan of Care (Signed)

## 2022-08-14 NOTE — Progress Notes (Signed)
PHARMACY NOTE:  ANTIMICROBIAL RENAL DOSAGE ADJUSTMENT  Current antimicrobial regimen includes a mismatch between antimicrobial dosage and estimated renal function.  As per policy approved by the Pharmacy & Therapeutics and Medical Executive Committees, the antimicrobial dosage will be adjusted accordingly.  Current antimicrobial dosage:  Cefepime 1gm IV q24  Indication: UTI  Renal Function:  Estimated Creatinine Clearance: 96.4 mL/min (by C-G formula based on SCr of 0.76 mg/dL).  Antimicrobial dosage has been changed to:  Cefepime 2gm IV q12   Thank you for allowing pharmacy to be a part of this patient's care.  Sherlon Handing, PharmD, BCPS Please see amion for complete clinical pharmacist phone list 08/14/2022 8:37 PM

## 2022-08-15 ENCOUNTER — Encounter (HOSPITAL_COMMUNITY): Payer: Self-pay

## 2022-08-15 LAB — BLOOD CULTURE ID PANEL (REFLEXED) - BCID2
A.calcoaceticus-baumannii: NOT DETECTED
Bacteroides fragilis: NOT DETECTED
CTX-M ESBL: NOT DETECTED
Candida albicans: NOT DETECTED
Candida auris: NOT DETECTED
Candida glabrata: NOT DETECTED
Candida krusei: NOT DETECTED
Candida parapsilosis: NOT DETECTED
Candida tropicalis: NOT DETECTED
Carbapenem resist OXA 48 LIKE: NOT DETECTED
Carbapenem resistance IMP: NOT DETECTED
Carbapenem resistance KPC: NOT DETECTED
Carbapenem resistance NDM: NOT DETECTED
Carbapenem resistance VIM: NOT DETECTED
Cryptococcus neoformans/gattii: NOT DETECTED
Enterobacter cloacae complex: NOT DETECTED
Enterobacterales: DETECTED — AB
Enterococcus Faecium: NOT DETECTED
Enterococcus faecalis: NOT DETECTED
Escherichia coli: DETECTED — AB
Haemophilus influenzae: NOT DETECTED
Klebsiella aerogenes: NOT DETECTED
Klebsiella oxytoca: NOT DETECTED
Klebsiella pneumoniae: NOT DETECTED
Listeria monocytogenes: NOT DETECTED
Neisseria meningitidis: NOT DETECTED
Proteus species: NOT DETECTED
Pseudomonas aeruginosa: DETECTED — AB
Salmonella species: NOT DETECTED
Serratia marcescens: NOT DETECTED
Staphylococcus aureus (BCID): NOT DETECTED
Staphylococcus epidermidis: NOT DETECTED
Staphylococcus lugdunensis: NOT DETECTED
Staphylococcus species: NOT DETECTED
Stenotrophomonas maltophilia: NOT DETECTED
Streptococcus agalactiae: NOT DETECTED
Streptococcus pneumoniae: NOT DETECTED
Streptococcus pyogenes: NOT DETECTED
Streptococcus species: NOT DETECTED

## 2022-08-15 LAB — CBC
HCT: 34.2 % — ABNORMAL LOW (ref 39.0–52.0)
Hemoglobin: 12.3 g/dL — ABNORMAL LOW (ref 13.0–17.0)
MCH: 34.1 pg — ABNORMAL HIGH (ref 26.0–34.0)
MCHC: 36 g/dL (ref 30.0–36.0)
MCV: 94.7 fL (ref 80.0–100.0)
Platelets: 165 10*3/uL (ref 150–400)
RBC: 3.61 MIL/uL — ABNORMAL LOW (ref 4.22–5.81)
RDW: 13.5 % (ref 11.5–15.5)
WBC: 11.2 10*3/uL — ABNORMAL HIGH (ref 4.0–10.5)
nRBC: 0 % (ref 0.0–0.2)

## 2022-08-15 LAB — BASIC METABOLIC PANEL
Anion gap: 11 (ref 5–15)
BUN: 43 mg/dL — ABNORMAL HIGH (ref 8–23)
CO2: 21 mmol/L — ABNORMAL LOW (ref 22–32)
Calcium: 7.8 mg/dL — ABNORMAL LOW (ref 8.9–10.3)
Chloride: 101 mmol/L (ref 98–111)
Creatinine, Ser: 1.63 mg/dL — ABNORMAL HIGH (ref 0.61–1.24)
GFR, Estimated: 43 mL/min — ABNORMAL LOW (ref >60)
Glucose, Bld: 102 mg/dL — ABNORMAL HIGH (ref 70–99)
Potassium: 3.1 mmol/L — ABNORMAL LOW (ref 3.5–5.1)
Sodium: 133 mmol/L — ABNORMAL LOW (ref 135–145)

## 2022-08-15 MED ORDER — SODIUM CHLORIDE 0.9 % IV SOLN
2.0000 g | Freq: Three times a day (TID) | INTRAVENOUS | Status: DC
  Administered 2022-08-15: 2 g via INTRAVENOUS
  Filled 2022-08-15 (×3): qty 12.5

## 2022-08-15 MED ORDER — PNEUMOCOCCAL 20-VAL CONJ VACC 0.5 ML IM SUSY
0.5000 mL | PREFILLED_SYRINGE | INTRAMUSCULAR | Status: AC
  Administered 2022-08-16: 0.5 mL via INTRAMUSCULAR
  Filled 2022-08-15: qty 0.5

## 2022-08-15 MED ORDER — POTASSIUM CHLORIDE CRYS ER 20 MEQ PO TBCR
40.0000 meq | EXTENDED_RELEASE_TABLET | Freq: Once | ORAL | Status: AC
  Administered 2022-08-15: 40 meq via ORAL
  Filled 2022-08-15: qty 2

## 2022-08-15 MED ORDER — INFLUENZA VAC A&B SA ADJ QUAD 0.5 ML IM PRSY
0.5000 mL | PREFILLED_SYRINGE | INTRAMUSCULAR | Status: AC
  Administered 2022-08-16: 0.5 mL via INTRAMUSCULAR
  Filled 2022-08-15: qty 0.5

## 2022-08-15 MED ORDER — SODIUM CHLORIDE 0.9 % IV SOLN
2.0000 g | Freq: Two times a day (BID) | INTRAVENOUS | Status: DC
  Administered 2022-08-16: 2 g via INTRAVENOUS
  Filled 2022-08-15: qty 12.5

## 2022-08-15 NOTE — Progress Notes (Signed)
PHARMACY NOTE:  ANTIMICROBIAL RENAL DOSAGE ADJUSTMENT  Current antimicrobial regimen includes a mismatch between antimicrobial dosage and estimated renal function.  As per policy approved by the Pharmacy & Therapeutics and Medical Executive Committees, the antimicrobial dosage will be adjusted accordingly.  Current antimicrobial dosage:  Cefepime 2g IV every 8 hours  Indication: Pseudomonas and E.coli bacteremia  Renal Function:  Estimated Creatinine Clearance: 47.3 mL/min (A) (by C-G formula based on SCr of 1.63 mg/dL (H)). '[]'$      On intermittent HD, scheduled: '[]'$      On CRRT    Antimicrobial dosage has been changed to:  Cefepime 2g IV every 12 hours  Additional comments: Will check renal function in the AM to determine need for additional dose adjustments  Thank you for allowing pharmacy to be a part of this patient's care.  Alycia Rossetti, PharmD, BCPS Infectious Diseases Clinical Pharmacist 08/15/2022 3:21 PM   **Pharmacist phone directory can now be found on [email or web address] ([email or web address]).  Listed under [place].
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
## 2022-08-16 DIAGNOSIS — R7881 Bacteremia: Secondary | ICD-10-CM | POA: Diagnosis not present

## 2022-08-16 DIAGNOSIS — R21 Rash and other nonspecific skin eruption: Secondary | ICD-10-CM | POA: Diagnosis not present

## 2022-08-16 LAB — BASIC METABOLIC PANEL
Anion gap: 9 (ref 5–15)
BUN: 46 mg/dL — ABNORMAL HIGH (ref 8–23)
CO2: 22 mmol/L (ref 22–32)
Calcium: 7.9 mg/dL — ABNORMAL LOW (ref 8.9–10.3)
Chloride: 100 mmol/L (ref 98–111)
Creatinine, Ser: 1.29 mg/dL — ABNORMAL HIGH (ref 0.61–1.24)
GFR, Estimated: 56 mL/min — ABNORMAL LOW (ref >60)
Glucose, Bld: 101 mg/dL — ABNORMAL HIGH (ref 70–99)
Potassium: 3.1 mmol/L — ABNORMAL LOW (ref 3.5–5.1)
Sodium: 131 mmol/L — ABNORMAL LOW (ref 135–145)

## 2022-08-16 LAB — TROPONIN I (HIGH SENSITIVITY)
Troponin I (High Sensitivity): 30 ng/L — ABNORMAL HIGH (ref <18)
Troponin I (High Sensitivity): 32 ng/L — ABNORMAL HIGH (ref <18)

## 2022-08-16 MED ORDER — POTASSIUM CHLORIDE 10 MEQ/100ML IV SOLN
10.0000 meq | INTRAVENOUS | Status: AC
  Administered 2022-08-16 (×6): 10 meq via INTRAVENOUS
  Filled 2022-08-16 (×6): qty 100

## 2022-08-16 MED ORDER — SODIUM CHLORIDE 0.9 % IV BOLUS
1000.0000 mL | Freq: Once | INTRAVENOUS | Status: AC
  Administered 2022-08-16: 1000 mL via INTRAVENOUS

## 2022-08-16 NOTE — Progress Notes (Signed)
Pt foley inserted per protocol and order with RN Gretta Cool. Foley care was performed prior to foley insertion. Will continue to closely monitor pt. Delia Heady RN
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
## 2022-08-16 NOTE — Progress Notes (Signed)
Pt vitals after receiving I liter bolus of fluid. Delia Heady RN   08/16/22 1618  Vitals  Temp 97.7 F (36.5 C)  Temp Source Oral  BP (!) 103/58  MAP (mmHg) 71  BP Location Left Arm  BP Method Automatic  Patient Position (if appropriate) Lying  Pulse Rate 95  Pulse Rate Source Monitor  ECG Heart Rate 97  Resp 16  Level of Consciousness  Level of Consciousness Alert  MEWS COLOR  MEWS Score Color Green  Oxygen Therapy  SpO2 94 %  O2 Device Nasal Cannula  O2 Flow Rate (L/min) 3 L/min  MEWS Score  MEWS Temp 0  MEWS Systolic 0  MEWS Pulse 0  MEWS RR 0  MEWS LOC 0  MEWS Score 0
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
## 2022-08-16 NOTE — TOC Progression Note (Signed)
Transition of Care [facility]) - Progression Note    Patient Details  Name: Brandon [name] MRN: [id number] Date of Birth: 12-13-1940  Transition of Care [facility]) CM/SW Contact  Oren Section Cleta Alberts, RN Phone Number: 08/16/2022, 4:13 PM  Clinical Narrative:    [name] has received insurance authorization, but patient is not medically stable for discharge to SNF today.  Notified Crystal Jagger in admissions at [facility]; per MD, patient may be ready as soon as Wednesday.  Will likely have to [employer] authorization.  Will follow progress.  Expected Discharge Plan: [place] Barriers to Discharge: Continued Medical Work up  Expected Discharge Plan and Services Expected Discharge Plan: [place]   Discharge Planning Services: CM Consult   Living arrangements for the past 2 months: Single Family Home                                       Social Determinants of Health (SDOH) Interventions    Readmission Risk Interventions     No data to display         Reinaldo Raddle, RN, BSN  Trauma/Neuro ICU Case Manager [phone]

## 2022-08-17 LAB — URINE CULTURE: Culture: >=100000 — AB

## 2022-08-17 LAB — BASIC METABOLIC PANEL
Anion gap: 10 (ref 5–15)
BUN: 36 mg/dL — ABNORMAL HIGH (ref 8–23)
CO2: 21 mmol/L — ABNORMAL LOW (ref 22–32)
Calcium: 8 mg/dL — ABNORMAL LOW (ref 8.9–10.3)
Chloride: 103 mmol/L (ref 98–111)
Creatinine, Ser: 0.93 mg/dL (ref 0.61–1.24)
GFR, Estimated: >60 mL/min (ref >60)
Glucose, Bld: 98 mg/dL (ref 70–99)
Potassium: 3.2 mmol/L — ABNORMAL LOW (ref 3.5–5.1)
Sodium: 134 mmol/L — ABNORMAL LOW (ref 135–145)

## 2022-08-17 LAB — RPR: RPR Ser Ql: NONREACTIVE

## 2022-08-17 MED ORDER — POTASSIUM CHLORIDE 10 MEQ/100ML IV SOLN
10.0000 meq | INTRAVENOUS | Status: AC
  Administered 2022-08-17 (×6): 10 meq via INTRAVENOUS
  Filled 2022-08-17 (×6): qty 100

## 2022-08-17 MED ORDER — SODIUM CHLORIDE 0.9 % IV SOLN
2.0000 g | Freq: Three times a day (TID) | INTRAVENOUS | Status: DC
  Administered 2022-08-17 – 2022-08-18 (×4): 2 g via INTRAVENOUS
  Filled 2022-08-17 (×4): qty 12.5

## 2022-08-17 MED ORDER — OXYCODONE HCL 5 MG PO TABS: 5.0000 mg | ORAL_TABLET | Freq: Four times a day (QID) | ORAL | 0 refills | Status: DC | PRN

## 2022-08-17 NOTE — Progress Notes (Signed)
Per verbal order Richard Miu, PA-C) single flesh color suture removed from under left eye. Pt tolerated well. Incision clean dry and intact.

## 2022-08-17 NOTE — Progress Notes (Signed)
Trauma Event Note    TRN to bedside to round. Pt up to chair at the time of my rounds, primary RN at bedside. Denies needs at this time. Foley placed today for retention. POC SNF when medically stable.  Last imported Vital Signs BP 114/68 (BP Location: Left Arm)   Pulse 94   Temp 98.2 F (36.8 C) (Oral)   Resp (!) 23   Ht 6' (1.829 m)   Wt 245 lb (111.1 kg)   SpO2 93%   BMI 33.23 kg/m   Trending CBC Recent Labs    08/15/22 0759  WBC 11.2*  HGB 12.3*  HCT 34.2*  PLT 165    Trending Coag's No results for input(s): "APTT", "INR" in the last 72 hours.  Trending BMET Recent Labs    08/15/22 0759 08/16/22 0221  NA 133* 131*  K 3.1* 3.1*  CL 101 100  CO2 21* 22  BUN 43* 46*  CREATININE 1.63* 1.29*  GLUCOSE 102* 101*      Brandon [name] Brandon [name]  Trauma Response RN  Please call TRN at [phone] for further assistance.
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
## 2022-08-17 NOTE — Progress Notes (Signed)
PT Cancellation Note  Patient Details Name: Brandon [name] MRN: [id number] DOB: [birth year]   Cancelled Treatment:    Reason Eval/Treat Not Completed: Fatigue/lethargy limiting ability to participate. Pt declining due to just returning to bed after eating lunch.   Lorriane Shire 08/17/2022, 1:41 PM

## 2022-08-17 NOTE — Progress Notes (Signed)
PHARMACY NOTE:  ANTIMICROBIAL RENAL DOSAGE ADJUSTMENT  Current antimicrobial regimen includes a mismatch between antimicrobial dosage and estimated renal function.  As per policy approved by the Pharmacy & Therapeutics and Medical Executive Committees, the antimicrobial dosage will be adjusted accordingly.  Current antimicrobial dosage:  Cefepime 2g IV Q12H   Indication: PsA/E coli bacteremia   Renal Function:  Estimated Creatinine Clearance: 82.9 mL/min (by C-G formula based on SCr of 0.93 mg/dL). '[]'$      On intermittent HD, scheduled: '[]'$      On CRRT    Antimicrobial dosage has been changed to:  Cefepime 2g IV Q8H    Thank you for allowing pharmacy to be a part of this patient's care.  Adria Dill, PharmD PGY-2 Infectious Diseases Resident  08/17/2022 8:00 AM

## 2022-08-18 DIAGNOSIS — R7881 Bacteremia: Secondary | ICD-10-CM | POA: Diagnosis not present

## 2022-08-18 LAB — CULTURE, BLOOD (ROUTINE X 2)
Special Requests: ADEQUATE
Special Requests: ADEQUATE

## 2022-08-18 LAB — BASIC METABOLIC PANEL
Anion gap: 8 (ref 5–15)
BUN: 21 mg/dL (ref 8–23)
CO2: 23 mmol/L (ref 22–32)
Calcium: 8 mg/dL — ABNORMAL LOW (ref 8.9–10.3)
Chloride: 107 mmol/L (ref 98–111)
Creatinine, Ser: 0.79 mg/dL (ref 0.61–1.24)
GFR, Estimated: >60 mL/min (ref >60)
Glucose, Bld: 110 mg/dL — ABNORMAL HIGH (ref 70–99)
Potassium: 3.4 mmol/L — ABNORMAL LOW (ref 3.5–5.1)
Sodium: 138 mmol/L (ref 135–145)

## 2022-08-18 LAB — FLUORESCENT TREPONEMAL AB(FTA)-IGG-BLD: Fluorescent Treponemal Ab, IgG: NONREACTIVE

## 2022-08-18 MED ORDER — CIPROFLOXACIN HCL 500 MG PO TABS
500.0000 mg | ORAL_TABLET | Freq: Two times a day (BID) | ORAL | Status: DC
  Administered 2022-08-18 – 2022-08-19 (×2): 500 mg via ORAL
  Filled 2022-08-18 (×4): qty 1

## 2022-08-18 MED ORDER — POTASSIUM CHLORIDE 10 MEQ/100ML IV SOLN
10.0000 meq | INTRAVENOUS | Status: AC
  Administered 2022-08-18 (×5): 10 meq via INTRAVENOUS
  Filled 2022-08-18 (×5): qty 100
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
## 2022-08-18 NOTE — TOC Progression Note (Addendum)
Transition of Care [facility]) - Progression Note    Patient Details  Name: Brandon [name] MRN: [id number] Date of Birth: [birth year]  Transition of Care [facility]) CM/SW Contact  Oren Section Cleta Alberts, RN Phone Number: 08/18/2022, 1:50 PM  Clinical Narrative:    Patient medically stable for discharge to SNF, per PA.  Will resubmit for insurance authorization, as it has expired.  Per Crystal in admissions, [name] will have bed available for patient when auth received.  Will provide updates as available.     Expected Discharge Plan: [place] Barriers to Discharge: Continued Medical Work up  Expected Discharge Plan and Services Expected Discharge Plan: [place]   Discharge Planning Services: CM Consult   Living arrangements for the past 2 months: Single Family Home                                       Social Determinants of Health (SDOH) Interventions    Readmission Risk Interventions     No data to display         Reinaldo Raddle, RN, BSN  Trauma/Neuro ICU Case Manager [phone]
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
## 2022-08-19 DIAGNOSIS — Z7982 Long term (current) use of aspirin: Secondary | ICD-10-CM | POA: Diagnosis not present

## 2022-08-19 DIAGNOSIS — S065X9A Traumatic subdural hemorrhage with loss of consciousness of unspecified duration, initial encounter: Secondary | ICD-10-CM | POA: Diagnosis not present

## 2022-08-19 DIAGNOSIS — N509 Disorder of male genital organs, unspecified: Secondary | ICD-10-CM | POA: Diagnosis not present

## 2022-08-19 DIAGNOSIS — R600 Localized edema: Secondary | ICD-10-CM | POA: Diagnosis not present

## 2022-08-19 DIAGNOSIS — S0181XA Laceration without foreign body of other part of head, initial encounter: Secondary | ICD-10-CM | POA: Diagnosis present

## 2022-08-19 DIAGNOSIS — N489 Disorder of penis, unspecified: Secondary | ICD-10-CM | POA: Diagnosis not present

## 2022-08-19 DIAGNOSIS — R339 Retention of urine, unspecified: Secondary | ICD-10-CM | POA: Diagnosis not present

## 2022-08-19 DIAGNOSIS — D649 Anemia, unspecified: Secondary | ICD-10-CM | POA: Diagnosis not present

## 2022-08-19 DIAGNOSIS — I629 Nontraumatic intracranial hemorrhage, unspecified: Secondary | ICD-10-CM

## 2022-08-19 DIAGNOSIS — Z7401 Bed confinement status: Secondary | ICD-10-CM | POA: Diagnosis not present

## 2022-08-19 DIAGNOSIS — I10 Essential (primary) hypertension: Secondary | ICD-10-CM | POA: Diagnosis not present

## 2022-08-19 DIAGNOSIS — I739 Peripheral vascular disease, unspecified: Secondary | ICD-10-CM | POA: Diagnosis not present

## 2022-08-19 DIAGNOSIS — J9600 Acute respiratory failure, unspecified whether with hypoxia or hypercapnia: Secondary | ICD-10-CM | POA: Diagnosis not present

## 2022-08-19 DIAGNOSIS — I872 Venous insufficiency (chronic) (peripheral): Secondary | ICD-10-CM | POA: Diagnosis not present

## 2022-08-19 DIAGNOSIS — F3341 Major depressive disorder, recurrent, in partial remission: Secondary | ICD-10-CM | POA: Diagnosis not present

## 2022-08-19 DIAGNOSIS — S065X0D Traumatic subdural hemorrhage without loss of consciousness, subsequent encounter: Secondary | ICD-10-CM | POA: Diagnosis not present

## 2022-08-19 DIAGNOSIS — S2220XA Unspecified fracture of sternum, initial encounter for closed fracture: Secondary | ICD-10-CM | POA: Diagnosis not present

## 2022-08-19 DIAGNOSIS — Z960 Presence of urogenital implants: Secondary | ICD-10-CM | POA: Diagnosis not present

## 2022-08-19 DIAGNOSIS — S0285XD Fracture of orbit, unspecified, subsequent encounter for fracture with routine healing: Secondary | ICD-10-CM | POA: Diagnosis not present

## 2022-08-19 DIAGNOSIS — S065XAA Traumatic subdural hemorrhage with loss of consciousness status unknown, initial encounter: Secondary | ICD-10-CM | POA: Diagnosis present

## 2022-08-19 DIAGNOSIS — R058 Other specified cough: Secondary | ICD-10-CM | POA: Diagnosis not present

## 2022-08-19 DIAGNOSIS — R531 Weakness: Secondary | ICD-10-CM | POA: Diagnosis not present

## 2022-08-19 DIAGNOSIS — S01412A Laceration without foreign body of left cheek and temporomandibular area, initial encounter: Secondary | ICD-10-CM | POA: Diagnosis not present

## 2022-08-19 DIAGNOSIS — S2241XA Multiple fractures of ribs, right side, initial encounter for closed fracture: Secondary | ICD-10-CM | POA: Diagnosis not present

## 2022-08-19 DIAGNOSIS — Z09 Encounter for follow-up examination after completed treatment for conditions other than malignant neoplasm: Secondary | ICD-10-CM | POA: Diagnosis not present

## 2022-08-19 DIAGNOSIS — S0285XA Fracture of orbit, unspecified, initial encounter for closed fracture: Secondary | ICD-10-CM | POA: Diagnosis not present

## 2022-08-19 DIAGNOSIS — D490 Neoplasm of unspecified behavior of digestive system: Secondary | ICD-10-CM | POA: Diagnosis not present

## 2022-08-19 DIAGNOSIS — M79662 Pain in left lower leg: Secondary | ICD-10-CM | POA: Diagnosis not present

## 2022-08-19 DIAGNOSIS — M79661 Pain in right lower leg: Secondary | ICD-10-CM | POA: Diagnosis not present

## 2022-08-19 DIAGNOSIS — S2249XD Multiple fractures of ribs, unspecified side, subsequent encounter for fracture with routine healing: Secondary | ICD-10-CM | POA: Diagnosis not present

## 2022-08-19 DIAGNOSIS — R338 Other retention of urine: Secondary | ICD-10-CM | POA: Diagnosis not present

## 2022-08-19 DIAGNOSIS — Z4789 Encounter for other orthopedic aftercare: Secondary | ICD-10-CM | POA: Diagnosis not present

## 2022-08-19 DIAGNOSIS — E876 Hypokalemia: Secondary | ICD-10-CM | POA: Diagnosis not present

## 2022-08-19 DIAGNOSIS — K469 Unspecified abdominal hernia without obstruction or gangrene: Secondary | ICD-10-CM | POA: Diagnosis not present

## 2022-08-19 DIAGNOSIS — S022XXD Fracture of nasal bones, subsequent encounter for fracture with routine healing: Secondary | ICD-10-CM | POA: Diagnosis not present

## 2022-08-19 DIAGNOSIS — R2243 Localized swelling, mass and lump, lower limb, bilateral: Secondary | ICD-10-CM | POA: Diagnosis not present

## 2022-08-19 DIAGNOSIS — E785 Hyperlipidemia, unspecified: Secondary | ICD-10-CM | POA: Diagnosis not present

## 2022-08-19 DIAGNOSIS — S0292XD Unspecified fracture of facial bones, subsequent encounter for fracture with routine healing: Secondary | ICD-10-CM | POA: Diagnosis not present

## 2022-08-19 DIAGNOSIS — S065XAD Traumatic subdural hemorrhage with loss of consciousness status unknown, subsequent encounter: Secondary | ICD-10-CM | POA: Diagnosis not present

## 2022-08-19 DIAGNOSIS — Y92410 Unspecified street and highway as the place of occurrence of the external cause: Secondary | ICD-10-CM | POA: Diagnosis not present

## 2022-08-19 DIAGNOSIS — Z87898 Personal history of other specified conditions: Secondary | ICD-10-CM | POA: Diagnosis not present

## 2022-08-19 DIAGNOSIS — Z8709 Personal history of other diseases of the respiratory system: Secondary | ICD-10-CM | POA: Diagnosis not present

## 2022-08-19 DIAGNOSIS — S022XXA Fracture of nasal bones, initial encounter for closed fracture: Secondary | ICD-10-CM | POA: Diagnosis not present

## 2022-08-19 DIAGNOSIS — J449 Chronic obstructive pulmonary disease, unspecified: Secondary | ICD-10-CM | POA: Diagnosis not present

## 2022-08-19 DIAGNOSIS — Z23 Encounter for immunization: Secondary | ICD-10-CM | POA: Diagnosis not present

## 2022-08-19 DIAGNOSIS — H2513 Age-related nuclear cataract, bilateral: Secondary | ICD-10-CM | POA: Diagnosis not present

## 2022-08-19 DIAGNOSIS — R609 Edema, unspecified: Secondary | ICD-10-CM | POA: Diagnosis not present

## 2022-08-19 DIAGNOSIS — S2220XD Unspecified fracture of sternum, subsequent encounter for fracture with routine healing: Secondary | ICD-10-CM | POA: Diagnosis not present

## 2022-08-19 DIAGNOSIS — S2222XA Fracture of body of sternum, initial encounter for closed fracture: Secondary | ICD-10-CM | POA: Diagnosis not present

## 2022-08-19 MED ORDER — CIPROFLOXACIN HCL 500 MG PO TABS: 500.0000 mg | ORAL_TABLET | Freq: Two times a day (BID) | ORAL | 0 refills | Status: AC

## 2022-08-19 MED ORDER — ACETAMINOPHEN 325 MG PO TABS: 650.0000 mg | ORAL_TABLET | Freq: Four times a day (QID) | ORAL | 0 refills | Status: AC | PRN

## 2022-08-19 MED ORDER — METHOCARBAMOL 500 MG PO TABS: 500.0000 mg | ORAL_TABLET | Freq: Three times a day (TID) | ORAL | 0 refills | Status: AC | PRN

## 2022-08-19 MED ORDER — TAMSULOSIN HCL 0.4 MG PO CAPS: 0.4000 mg | ORAL_CAPSULE | Freq: Every day | ORAL | 0 refills | Status: AC

## 2022-08-19 MED ORDER — ERYTHROMYCIN 5 MG/GM OP OINT: TOPICAL_OINTMENT | Freq: Three times a day (TID) | OPHTHALMIC | 0 refills | Status: AC

## 2022-08-19 MED ORDER — LIDOCAINE 5 % EX PTCH: 1.0000 | MEDICATED_PATCH | CUTANEOUS | 0 refills | Status: AC

## 2022-08-19 MED ORDER — OXYCODONE HCL 5 MG PO TABS: 5.0000 mg | ORAL_TABLET | Freq: Four times a day (QID) | ORAL | 0 refills | Status: AC | PRN

## 2022-08-19 MED ORDER — DOCUSATE SODIUM 100 MG PO CAPS: 100.0000 mg | ORAL_CAPSULE | Freq: Every day | ORAL | 0 refills | Status: AC | PRN
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
## 2022-08-19 NOTE — TOC Transition Note (Signed)
Transition of Care [facility]) - CM/SW Discharge Note   Patient Details  Name: Brandon [name] MRN: [id number] Date of Birth: 11-Apr-1941  [facility]) CM/SW Contact:  Amador Cunas, [place] Phone Number: 08/19/2022, 3:29 PM   Clinical Narrative:  Pt for dc to [facility] today. Spoke to [facility] in admissions who confirmed they are prepared to admit pt to room 115. Pt's sister Brandon [name] aware of dc and reprts agreeable. Sister requesting medical update, SW notified provider. RN provided with number for report and PT[facility] called for transport. SW signing off at dc.   Wandra Feinstein, MSW, LCSW [phone] (coverage)       Final next level of care: Skilled Nursing Facility Barriers to Discharge: No Barriers Identified   Patient Goals and CMS Choice Patient states their goals for this hospitalization and ongoing recovery are:: to go home   Choice offered to / list presented to : Sibling, Spouse  Discharge Placement              Patient chooses bed at: [facility] Patient to be transferred to facility by: [place] Name of family member notified: Brandon [name]/Sister Patient and family notified of of transfer: 08/19/22  Discharge Plan and Services   Discharge Planning Services: CM Consult                                 Social Determinants of Health (SDOH) Interventions     Readmission Risk Interventions     No data to display

## 2022-08-19 NOTE — Discharge Instructions (Signed)
RIB FRACTURES  HOME INSTRUCTIONS   PAIN CONTROL:  Pain is best controlled by a usual combination of three different methods TOGETHER:  Ice/Heat Over the counter pain medication Prescription pain medication You may experience some swelling and bruising in area of broken ribs. Ice packs or heating pads (30-60 minutes up to 6 times a day) will help. Use ice for the first few days to help decrease swelling and bruising, then switch to heat to help relax tight/sore spots and speed recovery. Some people prefer to use ice alone, heat alone, alternating between ice & heat. Experiment to what works for you. Swelling and bruising can take several weeks to resolve.  It is helpful to take an over-the-counter pain medication regularly for the first few weeks. Choose one of the following that works best for you:  Naproxen (Aleve, etc) Two 280m tabs twice a day Ibuprofen (Advil, etc) Three 2043mtabs four times a day (every meal & bedtime) Acetaminophen (Tylenol, etc) 500-6503mour times a day (every meal & bedtime) A prescription for pain medication (such as oxycodone, hydrocodone, etc) may be given to you upon discharge. Take your pain medication as prescribed.  If you are having problems/concerns with the prescription medicine (does not control pain, nausea, vomiting, rash, itching, etc), please call us [phone] see if we need to switch you to a different pain medicine that will work better for you and/or control your side effect better. If you need a refill on your pain medication, please contact your pharmacy. They will contact our office to request authorization. Prescriptions will not be filled after 5 pm or on week-ends. Avoid getting constipated. When taking pain medications, it is common to experience some constipation. Increasing fluid intake and taking a fiber supplement (such as Metamucil, Citrucel, FiberCon, MiraLax, etc) 1-2 times a day regularly will usually help prevent this problem  from occurring. A mild laxative (prune juice, Milk of Magnesia, MiraLax, etc) should be taken according to package directions if there are no bowel movements after 48 hours.  Watch out for diarrhea. If you have many loose bowel movements, simplify your diet to bland foods & liquids for a few days. Stop any stool softeners and decrease your fiber supplement. Switching to mild anti-diarrheal medications (Kayopectate, Pepto Bismol) can help. If this worsens or does not improve, please call us.KoreaOLLOW UP  If a follow up appointment is needed one will be scheduled for you. If none is needed with our trauma team, please follow up with your primary care provider within 2-3 weeks from discharge. Please call [facility] at [phone] if you have any questions about follow up.  If you have any orthopedic or other injuries you will need to follow up as outlined in your follow up instructions.   WHEN TO CALL US [phone] pain control Reactions / problems with new medications (rash/itching, nausea, etc)  Fever over 101.5 F (38.5 C) Worsening swelling or bruising Worsening pain, productive cough, difficulty breathing or any other concerning symptoms  The clinic staff is available to answer your questions during regular business hours (8:30am-5pm). Please don't hesitate to call and ask to speak to one of our nurses for clinical concerns.  If you have a medical emergency, go to the nearest emergency room or call 911.  A surgeon from [facility] is always on call at the [facility] [place] [id number] MAIN: [phone]  [phone] ? TOLL FREE: [phone] ?  FAX [phone]  [email or web address]      Information on Rib Fractures  A rib fracture is a break or crack in one of the bones of the ribs. The ribs are long, curved bones that wrap around your chest and attach to your spine and your breastbone. The  ribs protect your heart, lungs, and other organs in the chest. A broken or cracked rib is often painful but is not usually serious. Most rib fractures heal on their own over time. However, rib fractures can be more serious if multiple ribs are broken or if broken ribs move out of place and push against other structures or organs. What are the causes? This condition is caused by: Repetitive movements with high force, such as pitching a baseball or having severe coughing spells. A direct blow to the chest, such as a sports injury, a car accident, or a fall. Cancer that has spread to the bones, which can weaken bones and cause them to break. What are the signs or symptoms? Symptoms of this condition include: Pain when you breathe in or cough. Pain when someone presses on the injured area. Feeling short of breath. How is this diagnosed? This condition is diagnosed with a physical exam and medical history. Imaging tests may also be done, such as: Chest X-ray. CT scan. MRI. Lohn scan. Chest ultrasound. How is this treated? Treatment for this condition depends on the severity of the fracture. Most rib fractures usually heal on their own in 1-3 months. Sometimes healing takes longer if there is a cough that does not stop or if there are other activities that make the injury worse (aggravating factors). While you heal, you will be given medicines to control the pain. You will also be taught deep breathing exercises. Severe injuries may require hospitalization or surgery. Follow these instructions at home: Managing pain, stiffness, and swelling If directed, apply ice to the injured area. Put ice in a plastic bag. Place a towel between your skin and the bag. Leave the ice on for 20 minutes, 2-3 times a day. Take over-the-counter and prescription medicines only as told by your health care provider. Activity Avoid a lot of activity and any activities or movements that cause pain. Be careful during  activities and avoid bumping the injured rib. Slowly increase your activity as told by your health care provider. General instructions Do deep breathing exercises as told by your health care provider. This helps prevent pneumonia, which is a common complication of a broken rib. Your health care provider may instruct you to: Take deep breaths several times a day. Try to cough several times a day, holding a pillow against the injured area. Use a device called incentive spirometer to practice deep breathing several times a day. Drink enough fluid to keep your urine pale yellow. Do not wear a rib belt or binder. These restrict breathing, which can lead to pneumonia. Keep all follow-up visits as told by your health care provider. This is important. Contact a health care provider if: You have a fever. Get help right away if: You have difficulty breathing or you are short of breath. You develop a cough that does not stop, or you cough up thick or bloody sputum. You have nausea, vomiting, or pain in your abdomen. Your pain gets worse and medicine does not help. Summary A rib fracture is a break or crack in one of the bones of the ribs. A broken or cracked rib is  often painful but is not usually serious. Most rib fractures heal on their own over time. Treatment for this condition depends on the severity of the fracture. Avoid a lot of activity and any activities or movements that cause pain. This information is not intended to replace advice given to you by your health care provider. Make sure you discuss any questions you have with your health care provider. Document Released: 11/01/2005 Document Revised: 01/31/2017 Document Reviewed: 01/31/2017 Elsevier Interactive Patient Education  2019 [employer] Inc.  

## 2022-08-19 NOTE — Progress Notes (Signed)
Report called to [place] at [facility]. All questions answered. Discharge packet printed and placed in chart.

## 2022-08-19 NOTE — TOC Progression Note (Signed)
Transition of Care [facility]) - Progression Note    Patient Details  Name: Brandon [name] MRN: [id number] Date of Birth: 1941-04-08  Transition of [facility]) CM/SW [place], [place] Phone Number: 08/19/2022, 11:43 AM  Clinical Narrative:     Aida Puffer for SNF  # [id number] reference # [id number] 10/4-10/6  Expected Discharge Plan: Skilled Nursing Facility Barriers to Discharge: Barriers Resolved  Expected Discharge Plan and Services Expected Discharge Plan: [place]   Discharge Planning Services: CM Consult   Living arrangements for the past 2 months: Single Family Home                                       Social Determinants of Health (SDOH) Interventions    Readmission Risk Interventions     No data to display

## 2022-08-19 NOTE — Progress Notes (Signed)
[facility] on unit to transport pt. Discharge packet and prescription given to PT[facility].
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
## 2022-08-25 DIAGNOSIS — E876 Hypokalemia: Secondary | ICD-10-CM | POA: Diagnosis not present

## 2022-08-25 DIAGNOSIS — S0292XD Unspecified fracture of facial bones, subsequent encounter for fracture with routine healing: Secondary | ICD-10-CM | POA: Diagnosis not present

## 2022-08-25 DIAGNOSIS — I739 Peripheral vascular disease, unspecified: Secondary | ICD-10-CM | POA: Diagnosis not present

## 2022-08-25 DIAGNOSIS — Z7982 Long term (current) use of aspirin: Secondary | ICD-10-CM | POA: Diagnosis not present

## 2022-08-25 DIAGNOSIS — S2220XD Unspecified fracture of sternum, subsequent encounter for fracture with routine healing: Secondary | ICD-10-CM | POA: Diagnosis not present

## 2022-08-25 DIAGNOSIS — S065X0D Traumatic subdural hemorrhage without loss of consciousness, subsequent encounter: Secondary | ICD-10-CM | POA: Diagnosis not present

## 2022-08-25 DIAGNOSIS — S2249XD Multiple fractures of ribs, unspecified side, subsequent encounter for fracture with routine healing: Secondary | ICD-10-CM | POA: Diagnosis not present

## 2022-08-25 DIAGNOSIS — Z960 Presence of urogenital implants: Secondary | ICD-10-CM | POA: Diagnosis not present

## 2022-08-25 DIAGNOSIS — R609 Edema, unspecified: Secondary | ICD-10-CM | POA: Diagnosis not present

## 2022-08-25 DIAGNOSIS — K469 Unspecified abdominal hernia without obstruction or gangrene: Secondary | ICD-10-CM | POA: Diagnosis not present

## 2022-08-25 DIAGNOSIS — I10 Essential (primary) hypertension: Secondary | ICD-10-CM | POA: Diagnosis not present

## 2022-08-25 DIAGNOSIS — S0285XD Fracture of orbit, unspecified, subsequent encounter for fracture with routine healing: Secondary | ICD-10-CM | POA: Diagnosis not present

## 2022-08-25 DIAGNOSIS — R339 Retention of urine, unspecified: Secondary | ICD-10-CM | POA: Diagnosis not present

## 2022-08-25 DIAGNOSIS — D490 Neoplasm of unspecified behavior of digestive system: Secondary | ICD-10-CM | POA: Diagnosis not present

## 2022-08-25 DIAGNOSIS — Z87898 Personal history of other specified conditions: Secondary | ICD-10-CM | POA: Diagnosis not present

## 2022-08-25 DIAGNOSIS — S022XXD Fracture of nasal bones, subsequent encounter for fracture with routine healing: Secondary | ICD-10-CM | POA: Diagnosis not present

## 2022-08-26 DIAGNOSIS — M79661 Pain in right lower leg: Secondary | ICD-10-CM | POA: Diagnosis not present

## 2022-08-26 DIAGNOSIS — M79662 Pain in left lower leg: Secondary | ICD-10-CM | POA: Diagnosis not present

## 2022-08-26 DIAGNOSIS — R2243 Localized swelling, mass and lump, lower limb, bilateral: Secondary | ICD-10-CM | POA: Diagnosis not present

## 2022-09-01 DIAGNOSIS — N509 Disorder of male genital organs, unspecified: Secondary | ICD-10-CM | POA: Diagnosis not present

## 2022-09-01 DIAGNOSIS — Z7982 Long term (current) use of aspirin: Secondary | ICD-10-CM | POA: Diagnosis not present

## 2022-09-01 DIAGNOSIS — R339 Retention of urine, unspecified: Secondary | ICD-10-CM | POA: Diagnosis not present

## 2022-09-01 DIAGNOSIS — I739 Peripheral vascular disease, unspecified: Secondary | ICD-10-CM | POA: Diagnosis not present

## 2022-09-01 DIAGNOSIS — R600 Localized edema: Secondary | ICD-10-CM | POA: Diagnosis not present

## 2022-09-01 DIAGNOSIS — R338 Other retention of urine: Secondary | ICD-10-CM | POA: Diagnosis not present

## 2022-09-02 DIAGNOSIS — H2513 Age-related nuclear cataract, bilateral: Secondary | ICD-10-CM | POA: Diagnosis not present

## 2022-09-03 DIAGNOSIS — F3341 Major depressive disorder, recurrent, in partial remission: Secondary | ICD-10-CM | POA: Diagnosis not present

## 2022-09-03 DIAGNOSIS — S0285XD Fracture of orbit, unspecified, subsequent encounter for fracture with routine healing: Secondary | ICD-10-CM | POA: Diagnosis not present

## 2022-09-03 DIAGNOSIS — R339 Retention of urine, unspecified: Secondary | ICD-10-CM | POA: Diagnosis not present

## 2022-09-06 DIAGNOSIS — R600 Localized edema: Secondary | ICD-10-CM | POA: Diagnosis not present

## 2022-09-06 DIAGNOSIS — I872 Venous insufficiency (chronic) (peripheral): Secondary | ICD-10-CM | POA: Diagnosis not present

## 2022-09-06 DIAGNOSIS — Z7982 Long term (current) use of aspirin: Secondary | ICD-10-CM | POA: Diagnosis not present

## 2022-09-06 DIAGNOSIS — I739 Peripheral vascular disease, unspecified: Secondary | ICD-10-CM | POA: Diagnosis not present

## 2022-09-06 DIAGNOSIS — I10 Essential (primary) hypertension: Secondary | ICD-10-CM | POA: Diagnosis not present

## 2022-09-10 DIAGNOSIS — J449 Chronic obstructive pulmonary disease, unspecified: Secondary | ICD-10-CM | POA: Diagnosis not present

## 2022-09-10 DIAGNOSIS — S2220XD Unspecified fracture of sternum, subsequent encounter for fracture with routine healing: Secondary | ICD-10-CM | POA: Diagnosis not present

## 2022-09-10 DIAGNOSIS — S022XXD Fracture of nasal bones, subsequent encounter for fracture with routine healing: Secondary | ICD-10-CM | POA: Diagnosis not present

## 2022-09-10 DIAGNOSIS — Z8709 Personal history of other diseases of the respiratory system: Secondary | ICD-10-CM | POA: Diagnosis not present

## 2022-09-10 DIAGNOSIS — F3341 Major depressive disorder, recurrent, in partial remission: Secondary | ICD-10-CM | POA: Diagnosis not present

## 2022-09-10 DIAGNOSIS — S0285XD Fracture of orbit, unspecified, subsequent encounter for fracture with routine healing: Secondary | ICD-10-CM | POA: Diagnosis not present

## 2022-09-10 DIAGNOSIS — Z7982 Long term (current) use of aspirin: Secondary | ICD-10-CM | POA: Diagnosis not present

## 2022-09-10 DIAGNOSIS — S0292XD Unspecified fracture of facial bones, subsequent encounter for fracture with routine healing: Secondary | ICD-10-CM | POA: Diagnosis not present

## 2022-09-10 DIAGNOSIS — S2249XD Multiple fractures of ribs, unspecified side, subsequent encounter for fracture with routine healing: Secondary | ICD-10-CM | POA: Diagnosis not present

## 2022-09-10 DIAGNOSIS — Z09 Encounter for follow-up examination after completed treatment for conditions other than malignant neoplasm: Secondary | ICD-10-CM | POA: Diagnosis not present

## 2022-09-10 DIAGNOSIS — Z87898 Personal history of other specified conditions: Secondary | ICD-10-CM | POA: Diagnosis not present

## 2022-09-10 DIAGNOSIS — I10 Essential (primary) hypertension: Secondary | ICD-10-CM | POA: Diagnosis not present

## 2022-09-10 DIAGNOSIS — S065XAD Traumatic subdural hemorrhage with loss of consciousness status unknown, subsequent encounter: Secondary | ICD-10-CM | POA: Diagnosis not present

## 2022-09-15 DIAGNOSIS — S022XXD Fracture of nasal bones, subsequent encounter for fracture with routine healing: Secondary | ICD-10-CM | POA: Diagnosis not present

## 2022-09-15 DIAGNOSIS — R338 Other retention of urine: Secondary | ICD-10-CM | POA: Diagnosis not present

## 2022-09-15 DIAGNOSIS — S2220XD Unspecified fracture of sternum, subsequent encounter for fracture with routine healing: Secondary | ICD-10-CM | POA: Diagnosis not present

## 2022-09-15 DIAGNOSIS — I745 Embolism and thrombosis of iliac artery: Secondary | ICD-10-CM | POA: Diagnosis not present

## 2022-09-15 DIAGNOSIS — I700 Atherosclerosis of aorta: Secondary | ICD-10-CM | POA: Diagnosis not present

## 2022-09-15 DIAGNOSIS — S0636AD Traumatic hemorrhage of cerebrum, unspecified, with loss of consciousness status unknown, subsequent encounter: Secondary | ICD-10-CM | POA: Diagnosis not present

## 2022-09-15 DIAGNOSIS — H05239 Hemorrhage of unspecified orbit: Secondary | ICD-10-CM | POA: Diagnosis not present

## 2022-09-15 DIAGNOSIS — I701 Atherosclerosis of renal artery: Secondary | ICD-10-CM | POA: Diagnosis not present

## 2022-09-15 DIAGNOSIS — S0285XD Fracture of orbit, unspecified, subsequent encounter for fracture with routine healing: Secondary | ICD-10-CM | POA: Diagnosis not present

## 2022-09-15 DIAGNOSIS — S2249XD Multiple fractures of ribs, unspecified side, subsequent encounter for fracture with routine healing: Secondary | ICD-10-CM | POA: Diagnosis not present

## 2022-09-21 DIAGNOSIS — H524 Presbyopia: Secondary | ICD-10-CM | POA: Diagnosis not present

## 2022-09-21 DIAGNOSIS — D290 Benign neoplasm of penis: Secondary | ICD-10-CM | POA: Diagnosis not present

## 2022-09-21 DIAGNOSIS — H5213 Myopia, bilateral: Secondary | ICD-10-CM | POA: Diagnosis not present

## 2022-09-21 DIAGNOSIS — R3121 Asymptomatic microscopic hematuria: Secondary | ICD-10-CM | POA: Diagnosis not present

## 2022-09-21 DIAGNOSIS — H52209 Unspecified astigmatism, unspecified eye: Secondary | ICD-10-CM | POA: Diagnosis not present

## 2022-09-22 ENCOUNTER — Other Ambulatory Visit: Payer: Self-pay

## 2022-09-22 ENCOUNTER — Ambulatory Visit (INDEPENDENT_AMBULATORY_CARE_PROVIDER_SITE_OTHER): Payer: Medicare HMO | Admitting: Internal Medicine

## 2022-09-22 VITALS — BP 131/81 | HR 75 | Ht 70.5 in | Wt 234.0 lb

## 2022-09-22 DIAGNOSIS — R7881 Bacteremia: Secondary | ICD-10-CM
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
## 2022-09-22 NOTE — Patient Instructions (Signed)
You are doing very well, I don't think infection is lingering any more   If focal anterior chest pain, redness, swelling with or without fever/chill let me know   F/u urology for penile lesion removal

## 2022-09-30 DIAGNOSIS — D72829 Elevated white blood cell count, unspecified: Secondary | ICD-10-CM | POA: Diagnosis not present

## 2022-10-05 ENCOUNTER — Encounter: Payer: Self-pay | Admitting: Cardiology

## 2022-10-05 ENCOUNTER — Ambulatory Visit: Payer: Medicare HMO | Admitting: Cardiology

## 2022-10-05 VITALS — BP 123/64 | HR 76 | Temp 97.6°F | Resp 16 | Ht 70.5 in | Wt 236.0 lb

## 2022-10-05 DIAGNOSIS — I10 Essential (primary) hypertension: Secondary | ICD-10-CM

## 2022-10-05 DIAGNOSIS — Z9889 Other specified postprocedural states: Secondary | ICD-10-CM

## 2022-10-05 DIAGNOSIS — E7800 Pure hypercholesterolemia, unspecified: Secondary | ICD-10-CM | POA: Diagnosis not present

## 2022-10-05 DIAGNOSIS — R072 Precordial pain: Secondary | ICD-10-CM
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
## 2022-10-06 ENCOUNTER — Encounter: Payer: Self-pay | Admitting: Cardiology

## 2022-10-11 DIAGNOSIS — R319 Hematuria, unspecified: Secondary | ICD-10-CM | POA: Diagnosis not present

## 2022-10-11 DIAGNOSIS — R3121 Asymptomatic microscopic hematuria: Secondary | ICD-10-CM | POA: Diagnosis not present

## 2022-10-13 DIAGNOSIS — S0285XS Fracture of orbit, unspecified, sequela: Secondary | ICD-10-CM | POA: Diagnosis not present

## 2022-10-13 DIAGNOSIS — S0240FS Zygomatic fracture, left side, sequela: Secondary | ICD-10-CM | POA: Diagnosis not present

## 2022-10-13 DIAGNOSIS — H05402 Unspecified enophthalmos, left eye: Secondary | ICD-10-CM | POA: Diagnosis not present

## 2022-10-26 ENCOUNTER — Other Ambulatory Visit: Payer: Medicare HMO

## 2022-11-04 ENCOUNTER — Ambulatory Visit: Payer: Medicare HMO

## 2022-11-04 DIAGNOSIS — R072 Precordial pain: Secondary | ICD-10-CM

## 2022-11-04 DIAGNOSIS — E7800 Pure hypercholesterolemia, unspecified: Secondary | ICD-10-CM | POA: Diagnosis not present

## 2022-11-25 ENCOUNTER — Encounter: Payer: Self-pay | Admitting: Cardiology

## 2022-11-25 ENCOUNTER — Ambulatory Visit: Payer: Medicare PPO | Admitting: Cardiology

## 2022-11-25 VITALS — BP 147/74 | HR 66 | Ht 70.5 in | Wt 247.2 lb

## 2022-11-25 DIAGNOSIS — E7800 Pure hypercholesterolemia, unspecified: Secondary | ICD-10-CM | POA: Diagnosis not present

## 2022-11-25 DIAGNOSIS — I10 Essential (primary) hypertension: Secondary | ICD-10-CM | POA: Diagnosis not present

## 2022-11-25 DIAGNOSIS — I209 Angina pectoris, unspecified: Secondary | ICD-10-CM

## 2022-11-25 DIAGNOSIS — R9439 Abnormal result of other cardiovascular function study: Secondary | ICD-10-CM | POA: Diagnosis not present

## 2022-11-25 MED ORDER — AMLODIPINE BESYLATE 5 MG PO TABS: 5.0000 mg | ORAL_TABLET | Freq: Every day | ORAL | 2 refills | Status: DC

## 2022-11-25 MED ORDER — NITROGLYCERIN 0.4 MG SL SUBL: 0.4000 mg | SUBLINGUAL_TABLET | SUBLINGUAL | 3 refills | Status: DC | PRN
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
## 2022-11-25 NOTE — Patient Instructions (Signed)
Please go to any [facility] on a fasting state, do not eat or drink anything after midnight and get blood work done.  I will see you back here in about 10 weeks.

## 2022-12-14 DIAGNOSIS — I10 Essential (primary) hypertension: Secondary | ICD-10-CM | POA: Diagnosis not present

## 2022-12-15 LAB — BASIC METABOLIC PANEL
BUN/Creatinine Ratio: 17 (ref 10–24)
BUN: 13 mg/dL (ref 8–27)
CO2: 23 mmol/L (ref 20–29)
Calcium: 9.6 mg/dL (ref 8.6–10.2)
Chloride: 102 mmol/L (ref 96–106)
Creatinine, Ser: 0.77 mg/dL (ref 0.76–1.27)
Glucose: 89 mg/dL (ref 70–99)
Potassium: 4.1 mmol/L (ref 3.5–5.2)
Sodium: 139 mmol/L (ref 134–144)
eGFR: 91 mL/min/{1.73_m2} (ref >59)

## 2022-12-29 DIAGNOSIS — D290 Benign neoplasm of penis: Secondary | ICD-10-CM | POA: Diagnosis not present

## 2022-12-29 DIAGNOSIS — R3121 Asymptomatic microscopic hematuria: Secondary | ICD-10-CM | POA: Diagnosis not present

## 2022-12-30 ENCOUNTER — Other Ambulatory Visit: Payer: Self-pay | Admitting: Urology

## 2023-01-06 ENCOUNTER — Encounter (HOSPITAL_COMMUNITY): Payer: Self-pay

## 2023-01-06 ENCOUNTER — Encounter (HOSPITAL_COMMUNITY)
Admission: RE | Admit: 2023-01-06 | Discharge: 2023-01-06 | Disposition: A | Discharge status: Home / Self Care | Payer: Medicare HMO | Source: Ambulatory Visit | Attending: Urology | Admitting: Urology

## 2023-01-06 VITALS — BP 150/92 | HR 69 | Temp 97.8°F | Resp 16 | Ht 70.5 in | Wt 247.0 lb

## 2023-01-06 DIAGNOSIS — I10 Essential (primary) hypertension: Secondary | ICD-10-CM | POA: Insufficient documentation

## 2023-01-06 DIAGNOSIS — Z01812 Encounter for preprocedural laboratory examination: Secondary | ICD-10-CM | POA: Diagnosis not present

## 2023-01-06 LAB — BASIC METABOLIC PANEL
Anion gap: 8 (ref 5–15)
BUN: 11 mg/dL (ref 8–23)
CO2: 23 mmol/L (ref 22–32)
Calcium: 8.4 mg/dL — ABNORMAL LOW (ref 8.9–10.3)
Chloride: 107 mmol/L (ref 98–111)
Creatinine, Ser: 0.78 mg/dL (ref 0.61–1.24)
GFR, Estimated: >60 mL/min (ref >60)
Glucose, Bld: 98 mg/dL (ref 70–99)
Potassium: 4.4 mmol/L (ref 3.5–5.1)
Sodium: 138 mmol/L (ref 135–145)

## 2023-01-06 LAB — CBC
HCT: 47 % (ref 39.0–52.0)
Hemoglobin: 15.6 g/dL (ref 13.0–17.0)
MCH: 32.8 pg (ref 26.0–34.0)
MCHC: 33.2 g/dL (ref 30.0–36.0)
MCV: 98.9 fL (ref 80.0–100.0)
Platelets: 230 10*3/uL (ref 150–400)
RBC: 4.75 MIL/uL (ref 4.22–5.81)
RDW: 12.8 % (ref 11.5–15.5)
WBC: 9.2 10*3/uL (ref 4.0–10.5)
nRBC: 0 % (ref 0.0–0.2)

## 2023-01-06 NOTE — Progress Notes (Addendum)
COVID Vaccine Completed: yes  Date of COVID positive in last 90 days: no  PCP - Antony Contras, MD Cardiologist - Adrian Prows, MD  Chest x-ray - 08/14/22 Epic EKG - 10/05/22 Epic Stress Test - 11/04/22 Epic ECHO - 08/06/22 Epic Cardiac Cath - n/a Pacemaker/ICD device last checked: n/a Spinal Cord Stimulator: n/a  Bowel Prep - no  Sleep Study - n/a CPAP -   Fasting Blood Sugar - n/a Checks Blood Sugar _____ times a day  Last dose of GLP1 agonist-  N/A GLP1 instructions:  N/A   Last dose of SGLT-2 inhibitors-  N/A SGLT-2 instructions: N/A   Blood Thinner Instructions: Aspirin Instructions: ASA 81, no instructions. Instructed pt to call Last Dose:  Activity level: Can go up a flight of stairs and perform activities of daily living without stopping and without symptoms of chest pain. Sob with exertion  Anesthesia review: AAA, PVD, atherosclerosis, HTN, COPD, subdural hematoma, CAD  Patient denies shortness of breath, fever, cough and chest pain at PAT appointment  Patient verbalized understanding of instructions that were given to them at the PAT appointment. Patient was also instructed that they will need to review over the PAT instructions again at home before surgery.

## 2023-01-06 NOTE — Patient Instructions (Addendum)
SURGICAL WAITING ROOM VISITATION  Patients having surgery or a procedure may have no more than 2 support people in the waiting area - these visitors may rotate.    Children under the age of 41 must have an adult with them who is not the patient.  Due to an increase in RSV and influenza rates and associated hospitalizations, children ages 30 and under may not visit patients in [place].  If the patient needs to stay at the hospital during part of their recovery, the visitor guidelines for inpatient rooms apply. Pre-op nurse will coordinate an appropriate time for 1 support person to accompany patient in pre-op.  This support person may not rotate.    Please refer to the [facility] website for the visitor guidelines for Inpatients (after your surgery is over and you are in a regular room).    Your procedure is scheduled on: 01/24/23   Report to [facility] Main Entrance    Report to admitting at 11:00 AM   Call this number if you have problems the morning of surgery [phone]   Do not eat food or drink liquids :After Midnight.          If you have questions, please contact your surgeon's office.   FOLLOW BOWEL PREP AND ANY ADDITIONAL PRE OP INSTRUCTIONS YOU RECEIVED FROM YOUR SURGEON'S OFFICE!!!     Oral Hygiene is also important to reduce your risk of infection.                                    Remember - BRUSH YOUR TEETH THE MORNING OF SURGERY WITH YOUR REGULAR TOOTHPASTE  DENTURES WILL BE REMOVED PRIOR TO SURGERY PLEASE DO NOT APPLY "Poly grip" OR ADHESIVES!!!   Take these medicines the morning of surgery with A SIP OF WATER: Amlodipine, Atorvastatin, Citalopram                               You may not have any metal on your body including jewelry, and body piercing             Do not wear lotions, powders, cologne, or deodorant  Do not shave  48 hours prior to surgery.               Men may shave face and neck.   Do not bring valuables to the  hospital. [place].   Contacts, glasses, dentures or bridgework may not be worn into surgery.  DO NOT [place]. PHARMACY WILL DISPENSE MEDICATIONS LISTED ON YOUR MEDICATION LIST TO YOU DURING YOUR ADMISSION [place]!    Patients discharged on the day of surgery will not be allowed to drive home.  Someone NEEDS to stay with you for the first 24 hours after anesthesia.              Please read over the following fact sheets you were given: IF [place] [phone] Schneiders   If you received a COVID test during your pre-op visit  it is requested that you wear a mask when out in public, stay away from anyone that may not be feeling well and notify your surgeon if  you develop symptoms. If you test positive for Covid or have been in contact with anyone that has tested positive in the last 10 days please notify you surgeon.    [facility] - Preparing for Surgery Before surgery, you can play an important role.  Because skin is not sterile, your skin needs to be as free of germs as possible.  You can reduce the number of germs on your skin by washing with CHG (chlorahexidine gluconate) soap before surgery.  CHG is an antiseptic cleaner which kills germs and bonds with the skin to continue killing germs even after washing. Please DO NOT use if you have an allergy to CHG or antibacterial soaps.  If your skin becomes reddened/irritated stop using the CHG and inform your nurse when you arrive at Short Stay. Do not shave (including legs and underarms) for at least 48 hours prior to the first CHG shower.  You may shave your face/neck.  Please follow these instructions carefully:  1.  Shower with CHG Soap the night before surgery and the  morning of surgery.  2.  If you choose to wash your hair, wash your hair first as usual with your normal  shampoo.  3.  After you  shampoo, rinse your hair and body thoroughly to remove the shampoo.                             4.  Use CHG as you would any other liquid soap.  You can apply chg directly to the skin and wash.  Gently with a scrungie or clean washcloth.  5.  Apply the CHG Soap to your body ONLY FROM THE NECK DOWN.   Do   not use on face/ open                           Wound or open sores. Avoid contact with eyes, ears mouth and   genitals (private parts).                       Wash face,  Genitals (private parts) with your normal soap.             6.  Wash thoroughly, paying special attention to the area where your    surgery  will be performed.  7.  Thoroughly rinse your body with warm water from the neck down.  8.  DO NOT shower/wash with your normal soap after using and rinsing off the CHG Soap.                9.  Pat yourself dry with a clean towel.            10.  Wear clean pajamas.            11.  Place clean sheets on your bed the night of your first shower and do not  sleep with pets. Day of Surgery : Do not apply any lotions/deodorants the morning of surgery.  Please wear clean clothes to the hospital/surgery center.  FAILURE TO FOLLOW THESE INSTRUCTIONS MAY RESULT IN THE CANCELLATION OF YOUR SURGERY  PATIENT SIGNATURE_________________________________  NURSE SIGNATURE__________________________________  ________________________________________________________________________

## 2023-01-13 NOTE — Progress Notes (Signed)
Anesthesia Chart Review   Case: [id number] Date/Time: 01/24/23 1300   Procedures:      CYSTOSCOPY WITH BILATERAL RETROGRADE PYELOGRAM (Bilateral) - 60 MINS     EXCISION PENILE  MASS   Anesthesia type: Choice   Pre-op diagnosis:      PENILE MASS      MICROSCOPIC HEMATURIA   Location: [facility]   Surgeons: Lucas Mallow, MD       DISCUSSION:82 y.o. former smoker with h/o COPD, HTN, RBBB, AAA s/p repair 2013, PVD, penile mass, microscopic hematuria scheduled for above procedure 01/24/23 with Dr. Link Snuffer.   Pt last seen by cardiology 11/25/22. Per OV note in regards to recent stress test, "I reviewed the results of the nuclear stress test and images personally reviewed as well, he has a fixed defect in the inferior wall with inferior wall hypokinesis however his LVEF is preserved.  As there is no significant peri-infarct ischemia, will continue medical therapy.  I will start him on amlodipine for angina pectoris, he will continue with aspirin and statin as well. S/L NTG was prescribed and explained how to and when to use it and to notify us if there is change in frequency of use. Interaction with cialis-like agents (if applicable was discussed)."  Anticipate pt can proceed with planned procedure barring acute status change.   VS: There were no vitals taken for this visit.  PROVIDERS: Antony Contras, MD is PCP   Cardiologist - Adrian Prows, MD  LABS: Labs reviewed: Acceptable for surgery. (all labs ordered are listed, but only abnormal results are displayed)  Labs Reviewed - No data to display   IMAGES:   EKG:   CV: Exercise Myoview stress test 11/04/2022: Exercise nuclear stress test was performed using Bruce protocol.   1 Day Rest and Stress images. Exercise time 3 minutes 52 seconds on Bruce protocol, achieved 5.7 METS, 94% of age-predicted maximum heart rate (APMHR).  Hypertensive response to exercise: No (rest 136/78, peak 180/70) Stress ECG  nondiagnostic for ischemia (RBBB at baseline and uninterpretable ST-T due to motion artifact at peak exercise/recovery).  Medium size, moderate intensity, predominantly fixed perfusion defect involving the basal to mid inferior and inferolateral segments but peri-infarct ischemia cannot be ruled out. Left ventricular size is upper limit of normal, low normal LVEF calculated at 52%, with regional wall motion abnormalities. No prior studies available for comparison. Intermediate risk study, clinical correlation required.  Echo 08/06/22 1. Technically difficult study with very limited visualization of  intracardiac structures.  2. The left ventricle appears grossly normal in size and funciton with an  estimated EF of 55%-60%.  3. The right ventricle appears grossly normal in size and function.  FINDINGS   Left Ventricle: Left ventricular ejection fraction, by estimation, is 55  to 60%. The left ventricle has normal function. The left ventricle has no  regional wall motion abnormalities. The left ventricular internal cavity  size was normal in size. There is   no left ventricular hypertrophy. Left ventricular diastolic function  could not be evaluated.  Past Medical History:  Diagnosis Date   AAA (abdominal aortic aneurysm) (HCC)    COPD (chronic obstructive pulmonary disease) ([place])    DIC (disseminated intravascular coagulation) ([place]) 09/09/2012   Hyperlipidemia    Hypertension    Leg wound, left 12/12/2013   Peripheral vascular disease ([place])    Respiratory failure, post-operative ([place]) 09/09/2012   Shortness of breath     Past Surgical History:  Procedure Laterality Date   ABDOMINAL AORTIC ANEURYSM REPAIR  09/08/2012   Procedure: ANEURYSM ABDOMINAL AORTIC REPAIR;  Surgeon: [name], MD;  Location: [facility];  Service: Vascular;  Laterality: N/A;  open repair ruptured abdomenal aortic aneurysm   GANGLION CYST EXCISION     Right wrist    MEDICATIONS: No current  facility-administered medications for this encounter.    amLODipine (NORVASC) 5 MG tablet   aspirin 81 MG chewable tablet   atorvastatin (LIPITOR) 10 MG tablet   citalopram (CELEXA) 20 MG tablet   irbesartan (AVAPRO) 150 MG tablet   nitroGLYCERIN (NITROSTAT) 0.4 MG SL tablet   OVER THE COUNTER MEDICATION   OVER THE COUNTER MEDICATION   albuterol (VENTOLIN HFA) 108 (90 Base) MCG/ACT inhaler   Spacer/Aero-Holding Chambers (AEROCHAMBER PLUS WITH MASK) inhaler   Konrad Felix Ward, PA-C [facility] Pre-Surgical Testing [phone]

## 2023-01-24 ENCOUNTER — Other Ambulatory Visit: Payer: Self-pay

## 2023-01-24 ENCOUNTER — Encounter (HOSPITAL_COMMUNITY): Payer: Self-pay | Admitting: Urology

## 2023-01-24 ENCOUNTER — Ambulatory Visit (HOSPITAL_COMMUNITY): Payer: Medicare HMO

## 2023-01-24 ENCOUNTER — Ambulatory Visit (HOSPITAL_COMMUNITY)
Admission: RE | Admit: 2023-01-24 | Discharge: 2023-01-24 | Disposition: A | Discharge status: Home / Self Care | Payer: Medicare HMO | Source: Ambulatory Visit | Attending: Urology | Admitting: Urology

## 2023-01-24 ENCOUNTER — Encounter (HOSPITAL_COMMUNITY)
Admission: RE | Disposition: A | Discharge status: Home / Self Care | Payer: Self-pay | Source: Ambulatory Visit | Attending: Urology

## 2023-01-24 ENCOUNTER — Ambulatory Visit (HOSPITAL_BASED_OUTPATIENT_CLINIC_OR_DEPARTMENT_OTHER): Payer: Medicare HMO | Admitting: Physician Assistant

## 2023-01-24 ENCOUNTER — Ambulatory Visit (HOSPITAL_COMMUNITY): Payer: Medicare HMO | Admitting: Physician Assistant

## 2023-01-24 DIAGNOSIS — R3129 Other microscopic hematuria: Secondary | ICD-10-CM | POA: Insufficient documentation

## 2023-01-24 DIAGNOSIS — R69 Illness, unspecified: Secondary | ICD-10-CM | POA: Diagnosis not present

## 2023-01-24 DIAGNOSIS — J449 Chronic obstructive pulmonary disease, unspecified: Secondary | ICD-10-CM | POA: Diagnosis not present

## 2023-01-24 DIAGNOSIS — I10 Essential (primary) hypertension: Secondary | ICD-10-CM | POA: Diagnosis not present

## 2023-01-24 DIAGNOSIS — N4889 Other specified disorders of penis: Secondary | ICD-10-CM

## 2023-01-24 DIAGNOSIS — R3121 Asymptomatic microscopic hematuria: Secondary | ICD-10-CM | POA: Diagnosis not present

## 2023-01-24 DIAGNOSIS — Z79899 Other long term (current) drug therapy: Secondary | ICD-10-CM | POA: Insufficient documentation

## 2023-01-24 DIAGNOSIS — F17210 Nicotine dependence, cigarettes, uncomplicated: Secondary | ICD-10-CM | POA: Insufficient documentation

## 2023-01-24 DIAGNOSIS — C602 Malignant neoplasm of body of penis: Secondary | ICD-10-CM | POA: Insufficient documentation

## 2023-01-24 DIAGNOSIS — Z87891 Personal history of nicotine dependence: Secondary | ICD-10-CM

## 2023-01-24 DIAGNOSIS — Z8679 Personal history of other diseases of the circulatory system: Secondary | ICD-10-CM | POA: Diagnosis not present

## 2023-01-24 DIAGNOSIS — I739 Peripheral vascular disease, unspecified: Secondary | ICD-10-CM | POA: Diagnosis not present

## 2023-01-24 DIAGNOSIS — C609 Malignant neoplasm of penis, unspecified: Secondary | ICD-10-CM | POA: Diagnosis not present

## 2023-01-24 HISTORY — PX: CYSTOSCOPY W/ RETROGRADES: SHX1426

## 2023-01-24 HISTORY — PX: MASS EXCISION: SHX2000

## 2023-01-24 SURGERY — CYSTOSCOPY, WITH RETROGRADE PYELOGRAM
Anesthesia: General

## 2023-01-24 MED ORDER — SODIUM CHLORIDE 0.9 % IR SOLN
Status: DC | PRN
  Administered 2023-01-24: 3000 mL via INTRAVESICAL

## 2023-01-24 MED ORDER — LIDOCAINE 2% (20 MG/ML) 5 ML SYRINGE
INTRAMUSCULAR | Status: DC | PRN
  Administered 2023-01-24: 60 mg via INTRAVENOUS

## 2023-01-24 MED ORDER — PROPOFOL 10 MG/ML IV BOLUS
INTRAVENOUS | Status: AC
  Filled 2023-01-24: qty 20

## 2023-01-24 MED ORDER — HYDROCODONE-ACETAMINOPHEN 5-325 MG PO TABS: 1.0000 | ORAL_TABLET | ORAL | 0 refills | Status: DC | PRN

## 2023-01-24 MED ORDER — BUPIVACAINE HCL 0.25 % IJ SOLN
INTRAMUSCULAR | Status: AC
  Filled 2023-01-24: qty 1

## 2023-01-24 MED ORDER — DEXAMETHASONE SODIUM PHOSPHATE 10 MG/ML IJ SOLN
INTRAMUSCULAR | Status: DC | PRN
  Administered 2023-01-24: 10 mg via INTRAVENOUS

## 2023-01-24 MED ORDER — LACTATED RINGERS IV SOLN: INTRAVENOUS | Status: DC

## 2023-01-24 MED ORDER — ORAL CARE MOUTH RINSE: 15.0000 mL | Freq: Once | OROMUCOSAL | Status: AC

## 2023-01-24 MED ORDER — BUPIVACAINE HCL (PF) 0.25 % IJ SOLN
INTRAMUSCULAR | Status: DC | PRN
  Administered 2023-01-24: 10 mL

## 2023-01-24 MED ORDER — LIDOCAINE HCL (PF) 2 % IJ SOLN
INTRAMUSCULAR | Status: AC
  Filled 2023-01-24: qty 5

## 2023-01-24 MED ORDER — CEFAZOLIN SODIUM-DEXTROSE 2-4 GM/100ML-% IV SOLN
2.0000 g | INTRAVENOUS | Status: AC
  Administered 2023-01-24: 2 g via INTRAVENOUS
  Filled 2023-01-24: qty 100

## 2023-01-24 MED ORDER — CHLORHEXIDINE GLUCONATE 0.12 % MT SOLN
15.0000 mL | Freq: Once | OROMUCOSAL | Status: AC
  Administered 2023-01-24: 15 mL via OROMUCOSAL

## 2023-01-24 MED ORDER — FENTANYL CITRATE PF 50 MCG/ML IJ SOSY: 25.0000 ug | PREFILLED_SYRINGE | INTRAMUSCULAR | Status: DC | PRN

## 2023-01-24 MED ORDER — ONDANSETRON HCL 4 MG/2ML IJ SOLN
INTRAMUSCULAR | Status: AC
  Filled 2023-01-24: qty 2

## 2023-01-24 MED ORDER — AMISULPRIDE (ANTIEMETIC) 5 MG/2ML IV SOLN: 10.0000 mg | Freq: Once | INTRAVENOUS | Status: DC | PRN

## 2023-01-24 MED ORDER — ONDANSETRON HCL 4 MG/2ML IJ SOLN: 4.0000 mg | Freq: Once | INTRAMUSCULAR | Status: DC | PRN

## 2023-01-24 MED ORDER — BACITRACIN ZINC 500 UNIT/GM EX OINT
TOPICAL_OINTMENT | CUTANEOUS | Status: AC
  Filled 2023-01-24: qty 28.35

## 2023-01-24 MED ORDER — DEXAMETHASONE SODIUM PHOSPHATE 10 MG/ML IJ SOLN
INTRAMUSCULAR | Status: AC
  Filled 2023-01-24: qty 1

## 2023-01-24 MED ORDER — PROPOFOL 10 MG/ML IV BOLUS
INTRAVENOUS | Status: DC | PRN
  Administered 2023-01-24: 200 mg via INTRAVENOUS

## 2023-01-24 MED ORDER — ONDANSETRON HCL 4 MG/2ML IJ SOLN
INTRAMUSCULAR | Status: DC | PRN
  Administered 2023-01-24: 4 mg via INTRAVENOUS

## 2023-01-24 MED ORDER — FENTANYL CITRATE (PF) 100 MCG/2ML IJ SOLN
INTRAMUSCULAR | Status: DC | PRN
  Administered 2023-01-24: 50 ug via INTRAVENOUS

## 2023-01-24 MED ORDER — 0.9 % SODIUM CHLORIDE (POUR BTL) OPTIME
TOPICAL | Status: DC | PRN
  Administered 2023-01-24: 1000 mL

## 2023-01-24 MED ORDER — BACITRACIN ZINC 500 UNIT/GM EX OINT
TOPICAL_OINTMENT | CUTANEOUS | Status: DC | PRN
  Administered 2023-01-24: 1 via TOPICAL

## 2023-01-24 MED ORDER — FENTANYL CITRATE (PF) 100 MCG/2ML IJ SOLN
INTRAMUSCULAR | Status: AC
  Filled 2023-01-24: qty 2

## 2023-01-24 MED ORDER — ACETAMINOPHEN 10 MG/ML IV SOLN: 1000.0000 mg | Freq: Once | INTRAVENOUS | Status: DC | PRN

## 2023-01-24 SURGICAL SUPPLY — 37 items
BAG COUNTER SPONGE SURGICOUNT (BAG) IMPLANT
BAG SPNG CNTER NS LX DISP (BAG)
BAG URO CATCHER STRL LF (MISCELLANEOUS) ×2 IMPLANT
BLADE SURG 15 STRL LF DISP TIS (BLADE) ×2 IMPLANT
BLADE SURG 15 STRL SS (BLADE) ×2
BNDG COHESIVE 1X5 TAN STRL LF (GAUZE/BANDAGES/DRESSINGS) ×2 IMPLANT
CATH URETL OPEN END 6FR 70 (CATHETERS) IMPLANT
CLOTH BEACON ORANGE TIMEOUT ST (SAFETY) ×2 IMPLANT
COVER SURGICAL LIGHT HANDLE (MISCELLANEOUS) ×2 IMPLANT
DRAPE LAPAROTOMY T 98X78 PEDS (DRAPES) ×2 IMPLANT
ELECT REM PT RETURN 15FT ADLT (MISCELLANEOUS) ×2 IMPLANT
GAUZE 4X4 16PLY ~~LOC~~+RFID DBL (SPONGE) ×2 IMPLANT
GAUZE PETROLATUM 1 X8 (GAUZE/BANDAGES/DRESSINGS) ×2 IMPLANT
GLOVE BIO SURGEON STRL SZ7.5 (GLOVE) ×2 IMPLANT
GLOVE BIO SURGEON STRL SZ8 (GLOVE) ×2 IMPLANT
GOWN STRL REUS W/ TWL XL LVL3 (GOWN DISPOSABLE) ×2 IMPLANT
GOWN STRL REUS W/TWL XL LVL3 (GOWN DISPOSABLE) ×2
GUIDEWIRE STR DUAL SENSOR (WIRE) ×2 IMPLANT
KIT BASIN OR (CUSTOM PROCEDURE TRAY) ×2 IMPLANT
KIT TURNOVER KIT A (KITS) IMPLANT
NDL HYPO 22X1.5 SAFETY MO (MISCELLANEOUS) ×2 IMPLANT
NEEDLE HYPO 22X1.5 SAFETY MO (MISCELLANEOUS) ×2 IMPLANT
NEEDLE SAFETY HYPO 22GAX1.5 (MISCELLANEOUS) ×2
NS IRRIG 1000ML POUR BTL (IV SOLUTION) IMPLANT
PACK BASIC VI WITH GOWN DISP (CUSTOM PROCEDURE TRAY) ×2 IMPLANT
PACK CYSTO (CUSTOM PROCEDURE TRAY) ×2 IMPLANT
PENCIL SMOKE EVACUATOR (MISCELLANEOUS) IMPLANT
SPIKE FLUID TRANSFER (MISCELLANEOUS) ×2 IMPLANT
SUT CHROMIC 3 0 PS 2 (SUTURE) IMPLANT
SUT CHROMIC 3 0 SH 27 (SUTURE) ×4 IMPLANT
SUT SILK 2 0 (SUTURE)
SUT SILK 2-0 18XBRD TIE 12 (SUTURE) IMPLANT
SYR CONTROL 10ML LL (SYRINGE) IMPLANT
TOWEL OR 17X26 10 PK STRL BLUE (TOWEL DISPOSABLE) ×2 IMPLANT
TOWEL OR NON WOVEN STRL DISP B (DISPOSABLE) ×2 IMPLANT
TUBING CONNECTING 10 (TUBING) ×2 IMPLANT
WATER STERILE IRR 1000ML POUR (IV SOLUTION) IMPLANT

## 2023-01-24 NOTE — Discharge Instructions (Addendum)
Apply Neosporin or bacitracin to the stitch line twice daily.  You may shower tomorrow and wash with soap and water but do not scrub at it.  Do not submerge in water/take a bath or swim.  You may notice some bleeding from the incision line.  This is okay in small amounts.

## 2023-01-24 NOTE — Anesthesia Preprocedure Evaluation (Addendum)
Anesthesia Evaluation  Patient identified by MRN, date of birth, ID band Patient awake    Reviewed: Allergy & Precautions, NPO status , Patient's Chart, lab work & pertinent test results  Airway Mallampati: II  TM Distance: >3 FB Neck ROM: Full    Dental  (+) Edentulous Lower, Edentulous Upper   Pulmonary COPD,  COPD inhaler, former smoker   Pulmonary exam normal        Cardiovascular hypertension, Pt. on medications + Peripheral Vascular Disease  Normal cardiovascular exam     Neuro/Psych negative neurological ROS  negative psych ROS   GI/Hepatic negative GI ROS, Neg liver ROS,,,  Endo/Other  negative endocrine ROS    Renal/GU negative Renal ROS     Musculoskeletal negative musculoskeletal ROS (+)    Abdominal  (+) + obese  Peds  Hematology negative hematology ROS (+)   Anesthesia Other Findings PENILE MASS  MICROSCOPIC HEMATURIA    Reproductive/Obstetrics                             Anesthesia Physical Anesthesia Plan  ASA: 3  Anesthesia Plan: General   Post-op Pain Management:    Induction: Intravenous  PONV Risk Score and Plan: 2 and Ondansetron, Dexamethasone and Treatment may vary due to age or medical condition  Airway Management Planned: LMA  Additional Equipment:   Intra-op Plan:   Post-operative Plan: Extubation in OR  Informed Consent: I have reviewed the patients History and Physical, chart, labs and discussed the procedure including the risks, benefits and alternatives for the proposed anesthesia with the patient or authorized representative who has indicated his/her understanding and acceptance.       Plan Discussed with: CRNA  Anesthesia Plan Comments:        Anesthesia Quick Evaluation

## 2023-01-24 NOTE — Transfer of Care (Signed)
Immediate Anesthesia Transfer of Care Note  Patient: BRONC BUCKBEE  Procedure(s) Performed: FLEXIBLE CYSTOSCOPY (Bilateral) EXCISION PENILE  MASS  Patient Location: PACU  Anesthesia Type:General  Level of Consciousness: drowsy  Airway & Oxygen Therapy: Patient Spontanous Breathing and Patient connected to face mask  Post-op Assessment: Report given to RN and Post -op Vital signs reviewed and stable  Post vital signs: Reviewed and stable  Last Vitals:  Vitals Value Taken Time  BP 117/74 01/24/23 1100  Temp    Pulse 65 01/24/23 1100  Resp 17 01/24/23 1100  SpO2 100 % 01/24/23 1100  Vitals shown include unvalidated device data.  Last Pain:  Vitals:   01/24/23 0859  TempSrc:   PainSc: 0-No pain      Patients Stated Pain Goal: 3 ([id number])  Complications: No notable events documented.

## 2023-01-24 NOTE — Anesthesia Procedure Notes (Signed)
Procedure Name: LMA Insertion Date/Time: 01/24/2023 10:10 AM  Performed by: Lind Covert, CRNAPre-anesthesia Checklist: Patient identified, Emergency Drugs available, Suction available, Patient being monitored and Timeout performed Patient Re-evaluated:Patient Re-evaluated prior to induction Oxygen Delivery Method: Circle system utilized Preoxygenation: Pre-oxygenation with 100% oxygen LMA: LMA with gastric port inserted LMA Size: 5.0 Tube type: Oral Number of attempts: 1 Placement Confirmation: positive ETCO2 and breath sounds checked- equal and bilateral Tube secured with: Tape Dental Injury: Teeth and Oropharynx as per pre-operative assessment

## 2023-01-24 NOTE — Anesthesia Postprocedure Evaluation (Signed)
Anesthesia Post Note  Patient: Brandon [name]  Procedure(s) Performed: FLEXIBLE CYSTOSCOPY (Bilateral) EXCISION PENILE  MASS     Patient location during evaluation: PACU Anesthesia Type: General Level of consciousness: awake Pain management: pain level controlled Vital Signs Assessment: post-procedure vital signs reviewed and stable Respiratory status: spontaneous breathing, nonlabored ventilation and respiratory function stable Cardiovascular status: blood pressure returned to baseline and stable Postop Assessment: no apparent nausea or vomiting Anesthetic complications: no   No notable events documented.  Last Vitals:  Vitals:   01/24/23 1145 01/24/23 1200  BP: 138/83 (!) 147/90  Pulse: (!) 59 61  Resp: 18 20  Temp:  (!) 36.4 C  SpO2: 98% 97%    Last Pain:  Vitals:   01/24/23 1200  TempSrc:   PainSc: 0-No pain                 Ayaat Jansma P Nasiyah Laverdiere

## 2023-01-24 NOTE — Op Note (Signed)
Operative Note  Preoperative diagnosis:  1.  Penile mass 2.  Microscopic hematuria  Postoperative diagnosis: 1.  Same  Procedure(s): 1.  Diagnostic cystoscopy 2.  Excision of penile mass 4 cm x3cm  Surgeon: Link Snuffer, MD  Assistants: None  Anesthesia: General  Complications: None immediate  EBL: Minimal  Specimens: 1.  Penile mass  Drains/Catheters: 1.  None  Intraoperative findings: Penile mass, completely excised.  Cystoscopy revealed normal urethra.  Borderline obstructing prostate.  Bladder mucosa with mild trabeculation, no tumors.  Bilateral ureteral orifices were orthotopic.  Normal cystoscopy  Indication: 82 year old male with a penile mass presents for excision.  He also has microscopic hematuria.  CT hematuria protocol was negative.  Ureters were well-opacified and without any filling defect.  Description of procedure:  The patient was identified and consent was obtained.  The patient was taken to the operating room and placed in the supine position.  The patient was placed under general anesthesia.  Perioperative antibiotics were administered.  The patient was placed in supine position.  Patient was prepped and draped in a standard sterile fashion and a timeout was performed.  A 17 French flexible cystoscope was advanced into the urethra and into the bladder.  Complete cystoscopy was performed with no abnormal findings.  Scope was withdrawn.  I sharply incised circumferentially around the mass.  I then used Bovie electrocautery to remainder of the mass subcutaneously.  The mass did not appear invasive.  Entire mass was excised.  Spot electrocautery was used for hemostasis.  Skin was reapproximated with interrupted 3-0 chromic.  Quarter percent Marcaine was instilled for anesthetic effect.  Bacitracin was applied to the wound.  This concluded the operation.  Patient tolerated the procedure well and was stable postoperatively.  Plan: Follow-up in 1 to 2 weeks for  pathology review
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
## 2023-01-25 ENCOUNTER — Ambulatory Visit: Payer: Medicare HMO | Admitting: Cardiology

## 2023-01-25 ENCOUNTER — Encounter (HOSPITAL_COMMUNITY): Payer: Self-pay | Admitting: Urology

## 2023-01-26 LAB — SURGICAL PATHOLOGY

## 2023-02-03 ENCOUNTER — Ambulatory Visit: Payer: Medicare HMO | Admitting: Cardiology

## 2023-02-03 ENCOUNTER — Encounter: Payer: Self-pay | Admitting: Cardiology

## 2023-02-03 VITALS — BP 122/76 | HR 86 | Resp 16 | Ht 70.5 in | Wt 252.4 lb

## 2023-02-03 DIAGNOSIS — I209 Angina pectoris, unspecified: Secondary | ICD-10-CM | POA: Diagnosis not present

## 2023-02-03 DIAGNOSIS — I10 Essential (primary) hypertension: Secondary | ICD-10-CM

## 2023-02-03 DIAGNOSIS — E7800 Pure hypercholesterolemia, unspecified: Secondary | ICD-10-CM | POA: Diagnosis not present

## 2023-02-03 MED ORDER — AMLODIPINE BESYLATE 5 MG PO TABS: 5.0000 mg | ORAL_TABLET | Freq: Every day | ORAL | 3 refills | Status: DC
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
## 2023-02-04 LAB — LIPID PANEL WITH LDL/HDL RATIO
Cholesterol, Total: 141 mg/dL (ref 100–199)
HDL: 40 mg/dL (ref >39)
LDL Chol Calc (NIH): 79 mg/dL (ref 0–99)
LDL/HDL Ratio: 2 ratio (ref 0.0–3.6)
Triglycerides: 123 mg/dL (ref 0–149)
VLDL Cholesterol Cal: 22 mg/dL (ref 5–40)

## 2023-02-04 LAB — TSH: TSH: 2.26 u[IU]/mL (ref 0.450–4.500)

## 2023-02-04 NOTE — Progress Notes (Signed)
Let patient know his cholesterol is good

## 2023-02-08 DIAGNOSIS — C602 Malignant neoplasm of body of penis: Secondary | ICD-10-CM | POA: Diagnosis not present

## 2023-03-04 DIAGNOSIS — H2513 Age-related nuclear cataract, bilateral: Secondary | ICD-10-CM | POA: Diagnosis not present

## 2023-03-26 IMAGING — CT CT CHEST LUNG CANCER SCREENING LOW DOSE W/O CM
2 of 5 series · 15 of 40 positions shown, 18 images · non-contrast
Comparison: 09/20/2012.

CLINICAL DATA: Former smoker, quit 9 years ago, 52 pack-year
history.

EXAM:
CT CHEST WITHOUT CONTRAST LOW-DOSE FOR LUNG CANCER SCREENING
TECHNIQUE: Multidetector CT imaging of the chest was performed following the
standard protocol without IV contrast.

[Series 4: lung 1.00 br44 cor · coronal · 0.65mm/px · 3 of 447 slices shown]
[im 90/447  lung]
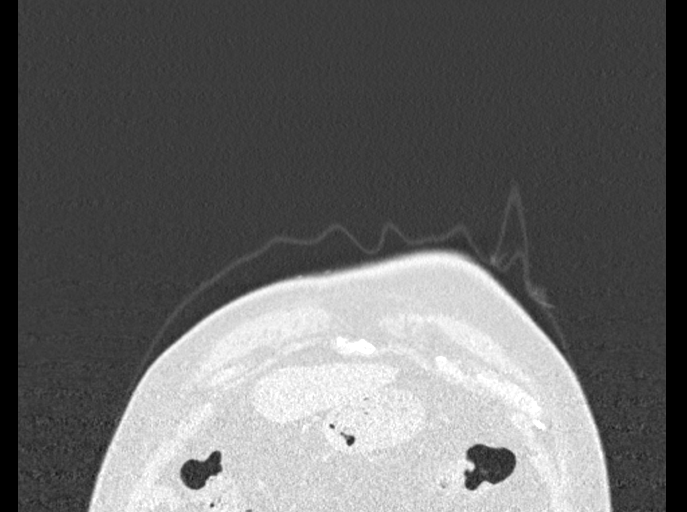
[im 179/447  lung]
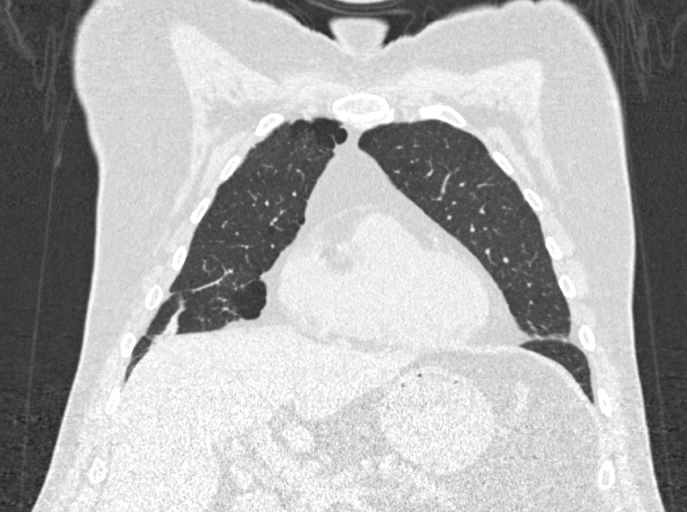
[im 268/447  lung]
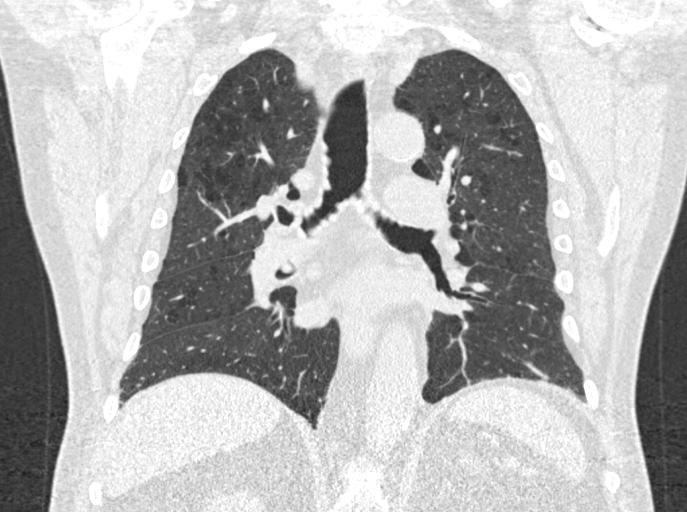

[Series 9: lung 1.00 br60 axial · axial · 0.87mm/px · z∈[-959,-658]mm · 12 of 333 slices shown, 15 images]
[im 16/333  mediastinal]
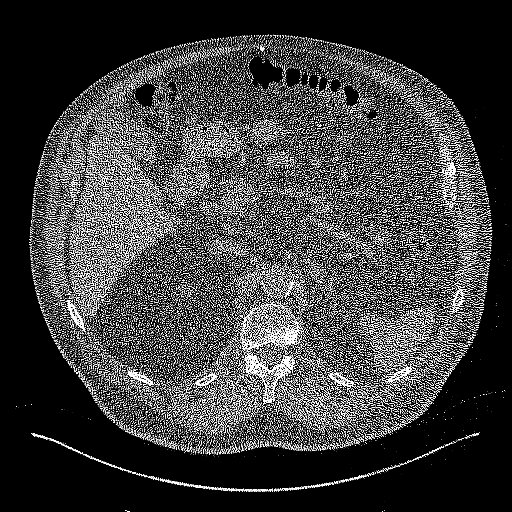
[im 16/333  lung]
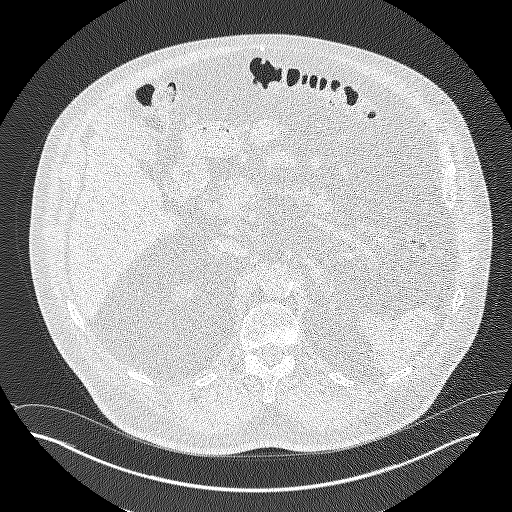
[im 46/333  lung]
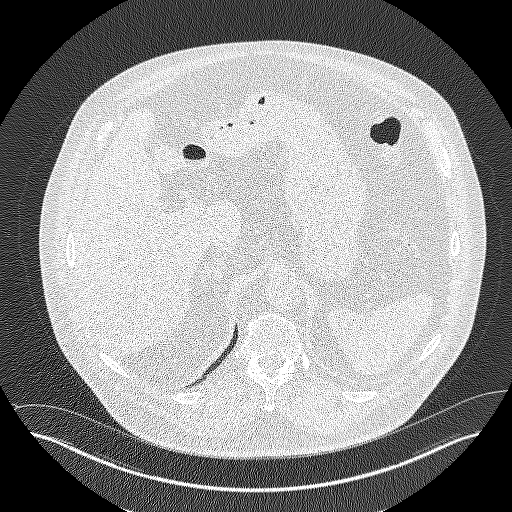
[im 76/333  lung]
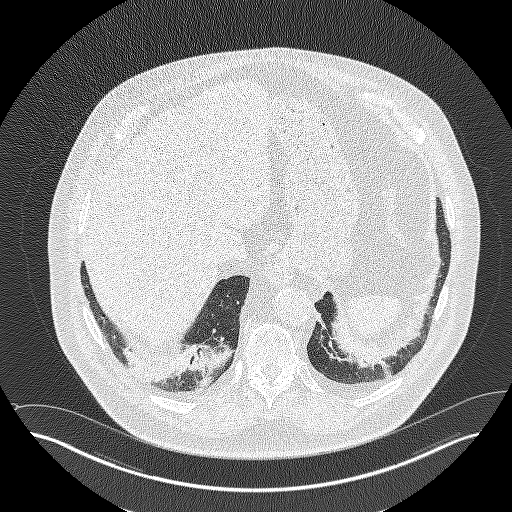
[im 106/333  lung]
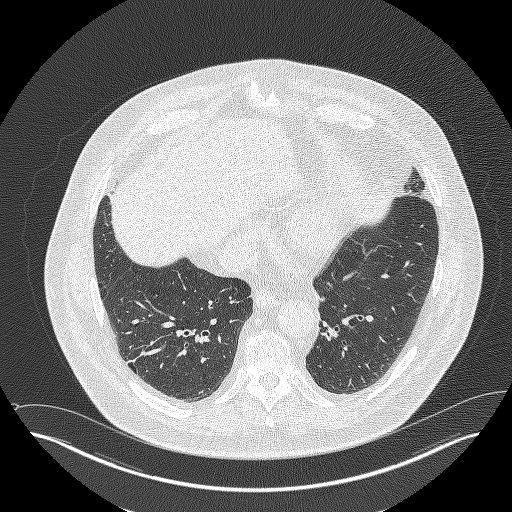
[im 121/333  mediastinal]
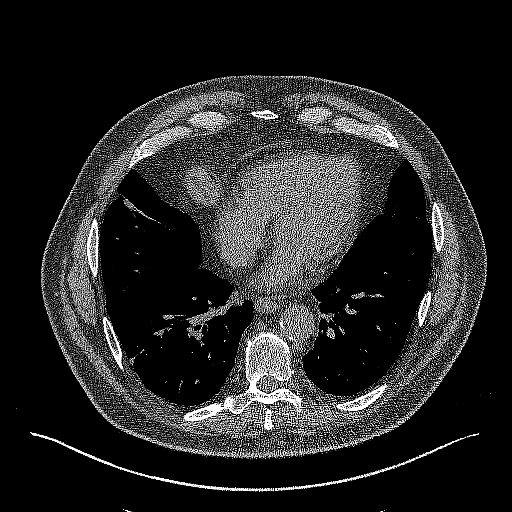
[im 121/333  lung]
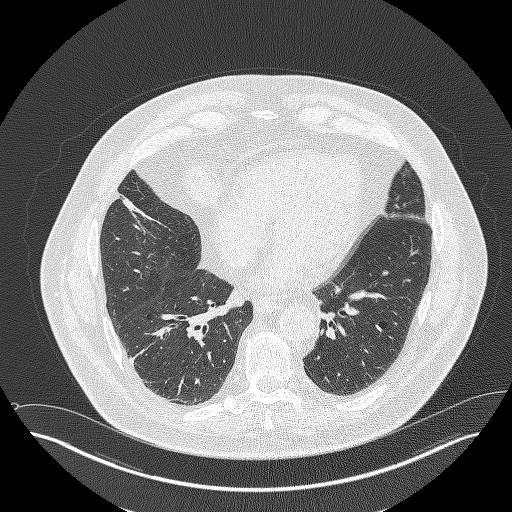
[im 151/333  lung]
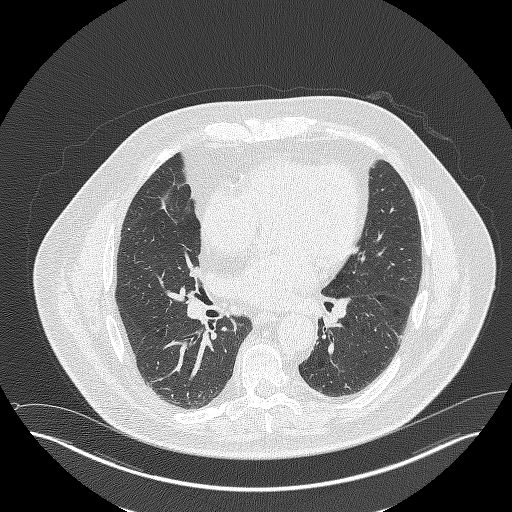
[im 182/333  lung]
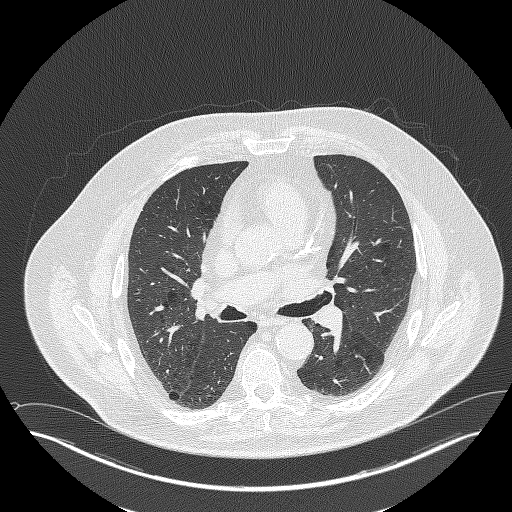
[im 212/333  lung]
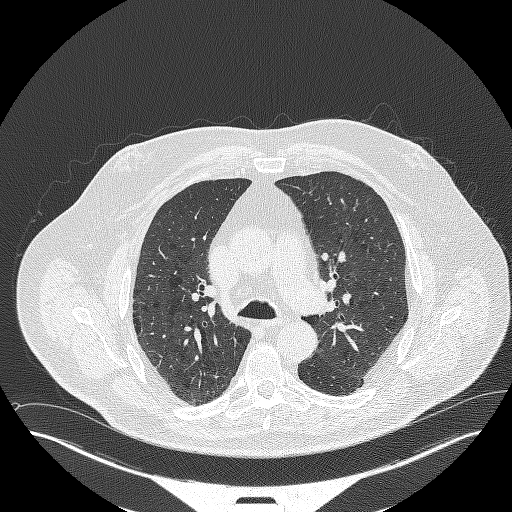
[im 227/333  mediastinal]
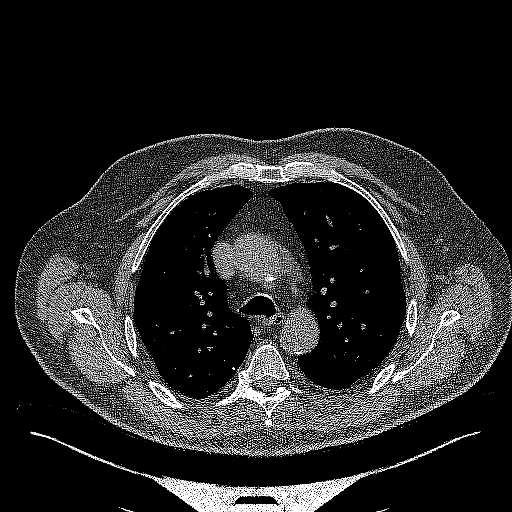
[im 227/333  lung]
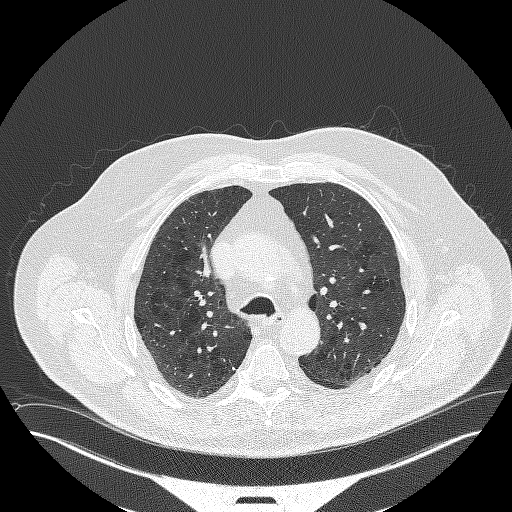
[im 257/333  lung]
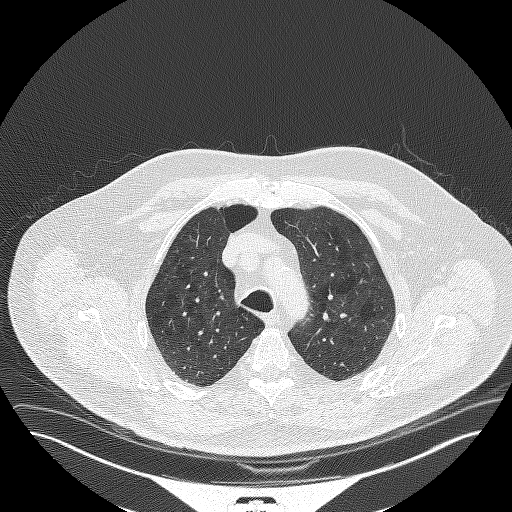
[im 287/333  lung]
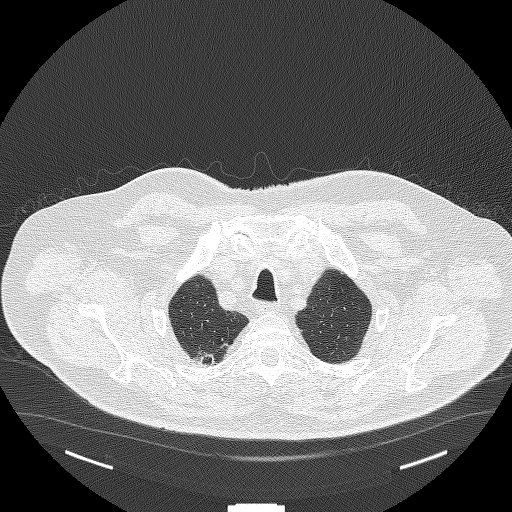
[im 317/333  lung]
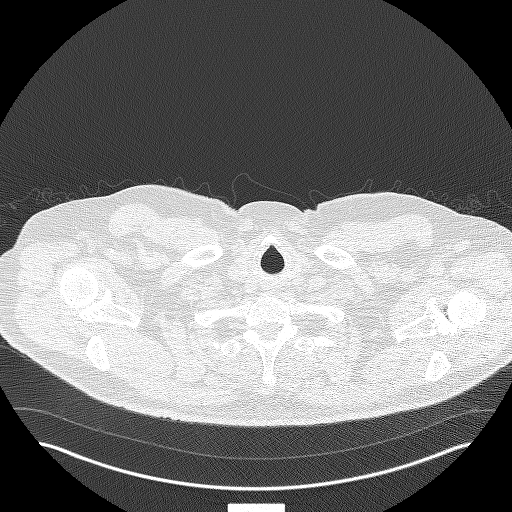

[15 of 40 positions shown; findings below may reference images not displayed]

FINDINGS: Cardiovascular: Atherosclerotic calcification of the aorta and
coronary arteries. Heart is enlarged. No pericardial effusion.

Mediastinum/Nodes: No pathologically enlarged mediastinal or
axillary lymph nodes. Hilar regions are difficult to definitively
evaluate without IV contrast but appear grossly unremarkable.
Esophagus is grossly unremarkable.

Lungs/Pleura: Centrilobular and paraseptal emphysema. Scattered
pulmonary parenchymal scarring, worst in the lower lobes. Pulmonary
nodules measure 4.4 mm less in size. No suspicious pulmonary
nodules. No pleural fluid. Adherent debris and/or nodularity along
the tracheal and mainstem bronchi walls.

Upper Abdomen: Visualized portions of the liver, gallbladder and
right adrenal gland are unremarkable. Nodularity of the left adrenal
gland. Visualized portions of the right kidney, spleen, pancreas,
stomach and bowel are grossly unremarkable.

Musculoskeletal: Degenerative changes in the spine. No worrisome
lytic or sclerotic lesions.
IMPRESSION: 1. Lung-RADS 2, benign appearance or behavior. Continue annual
screening with low-dose chest CT without contrast in 12 months.
2. Aortic atherosclerosis ([id number].Y). Coronary artery
calcification.
3.  Emphysema ([id number].X).

## 2023-04-27 DIAGNOSIS — C602 Malignant neoplasm of body of penis: Secondary | ICD-10-CM | POA: Diagnosis not present

## 2023-05-04 DIAGNOSIS — C609 Malignant neoplasm of penis, unspecified: Secondary | ICD-10-CM | POA: Diagnosis not present

## 2023-05-04 DIAGNOSIS — C602 Malignant neoplasm of body of penis: Secondary | ICD-10-CM | POA: Diagnosis not present

## 2023-05-11 DIAGNOSIS — C602 Malignant neoplasm of body of penis: Secondary | ICD-10-CM | POA: Diagnosis not present

## 2023-08-04 DIAGNOSIS — J449 Chronic obstructive pulmonary disease, unspecified: Secondary | ICD-10-CM | POA: Diagnosis not present

## 2023-08-04 DIAGNOSIS — Z1331 Encounter for screening for depression: Secondary | ICD-10-CM | POA: Diagnosis not present

## 2023-08-04 DIAGNOSIS — R7303 Prediabetes: Secondary | ICD-10-CM | POA: Diagnosis not present

## 2023-08-04 DIAGNOSIS — E7800 Pure hypercholesterolemia, unspecified: Secondary | ICD-10-CM | POA: Diagnosis not present

## 2023-08-04 DIAGNOSIS — Z0000 Encounter for general adult medical examination without abnormal findings: Secondary | ICD-10-CM | POA: Diagnosis not present

## 2023-08-04 DIAGNOSIS — I700 Atherosclerosis of aorta: Secondary | ICD-10-CM | POA: Diagnosis not present

## 2023-08-04 DIAGNOSIS — J439 Emphysema, unspecified: Secondary | ICD-10-CM | POA: Diagnosis not present

## 2023-08-04 DIAGNOSIS — I10 Essential (primary) hypertension: Secondary | ICD-10-CM | POA: Diagnosis not present

## 2023-08-04 DIAGNOSIS — Z23 Encounter for immunization: Secondary | ICD-10-CM | POA: Diagnosis not present

## 2023-08-08 ENCOUNTER — Ambulatory Visit: Payer: Medicare HMO | Admitting: Cardiology

## 2023-08-08 DIAGNOSIS — C602 Malignant neoplasm of body of penis: Secondary | ICD-10-CM | POA: Diagnosis not present

## 2023-08-17 DIAGNOSIS — C602 Malignant neoplasm of body of penis: Secondary | ICD-10-CM | POA: Diagnosis not present

## 2023-08-17 DIAGNOSIS — N281 Cyst of kidney, acquired: Secondary | ICD-10-CM | POA: Diagnosis not present

## 2023-08-17 DIAGNOSIS — K8020 Calculus of gallbladder without cholecystitis without obstruction: Secondary | ICD-10-CM | POA: Diagnosis not present

## 2023-08-17 DIAGNOSIS — I2510 Atherosclerotic heart disease of native coronary artery without angina pectoris: Secondary | ICD-10-CM | POA: Diagnosis not present

## 2023-08-23 ENCOUNTER — Encounter: Payer: Self-pay | Admitting: Cardiology

## 2023-08-23 ENCOUNTER — Ambulatory Visit: Payer: Medicare HMO | Attending: Cardiology | Admitting: Cardiology

## 2023-08-23 VITALS — BP 130/80 | HR 97 | Resp 16 | Ht 70.0 in | Wt 262.2 lb

## 2023-08-23 DIAGNOSIS — E7800 Pure hypercholesterolemia, unspecified: Secondary | ICD-10-CM | POA: Diagnosis not present

## 2023-08-23 DIAGNOSIS — I10 Essential (primary) hypertension: Secondary | ICD-10-CM | POA: Diagnosis not present

## 2023-08-23 DIAGNOSIS — R9439 Abnormal result of other cardiovascular function study: Secondary | ICD-10-CM | POA: Diagnosis not present

## 2023-08-23 DIAGNOSIS — I25118 Atherosclerotic heart disease of native coronary artery with other forms of angina pectoris: Secondary | ICD-10-CM

## 2023-08-23 MED ORDER — ATORVASTATIN CALCIUM 20 MG PO TABS: 20.0000 mg | ORAL_TABLET | Freq: Every day | ORAL | 3 refills | Status: DC
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
## 2023-08-23 NOTE — Patient Instructions (Signed)
Medication Instructions:  Your physician recommends that you continue on your current medications as directed. Please refer to the Current Medication list given to you today.  *If you need a refill on your cardiac medications before your next appointment, please call your pharmacy*  Lab Work: If you have labs (blood work) drawn today and your tests are completely normal, you will receive your results only by: MyChart Message (if you have MyChart) OR A paper copy in the mail If you have any lab test that is abnormal or we need to change your treatment, we will call you to review the results.  Testing/Procedures: None ordered today.   Follow-Up: At [facility], you and your health needs are our priority.  As part of our continuing mission to provide you with exceptional heart care, we have created designated Provider Care Teams.  These Care Teams include your primary Cardiologist (physician) and Advanced Practice Providers (APPs -  Physician Assistants and Nurse Practitioners) who all work together to provide you with the care you need, when you need it.  We recommend signing up for the patient portal called "MyChart".  Sign up information is provided on this After Visit Summary.  MyChart is used to connect with patients for Virtual Visits (Telemedicine).  Patients are able to view lab/test results, encounter notes, upcoming appointments, etc.  Non-urgent messages can be sent to your provider as well.   To learn more about what you can do with MyChart, go to [email or web address].    Your next appointment:   1 year(s)  Provider:   Yates Decamp, MD     Other Instructions

## 2023-09-20 DIAGNOSIS — H903 Sensorineural hearing loss, bilateral: Secondary | ICD-10-CM | POA: Diagnosis not present

## 2023-10-04 IMAGING — DX DG CHEST 2V
2 series · 2 of 2 positions shown · non-contrast
Comparison: 12/07/2020

CLINICAL DATA: 80-year-old male with a history shortness of breath
and dry cough

EXAM:
CHEST - 2 VIEW

[dg chest 2 view (1 of 2)]
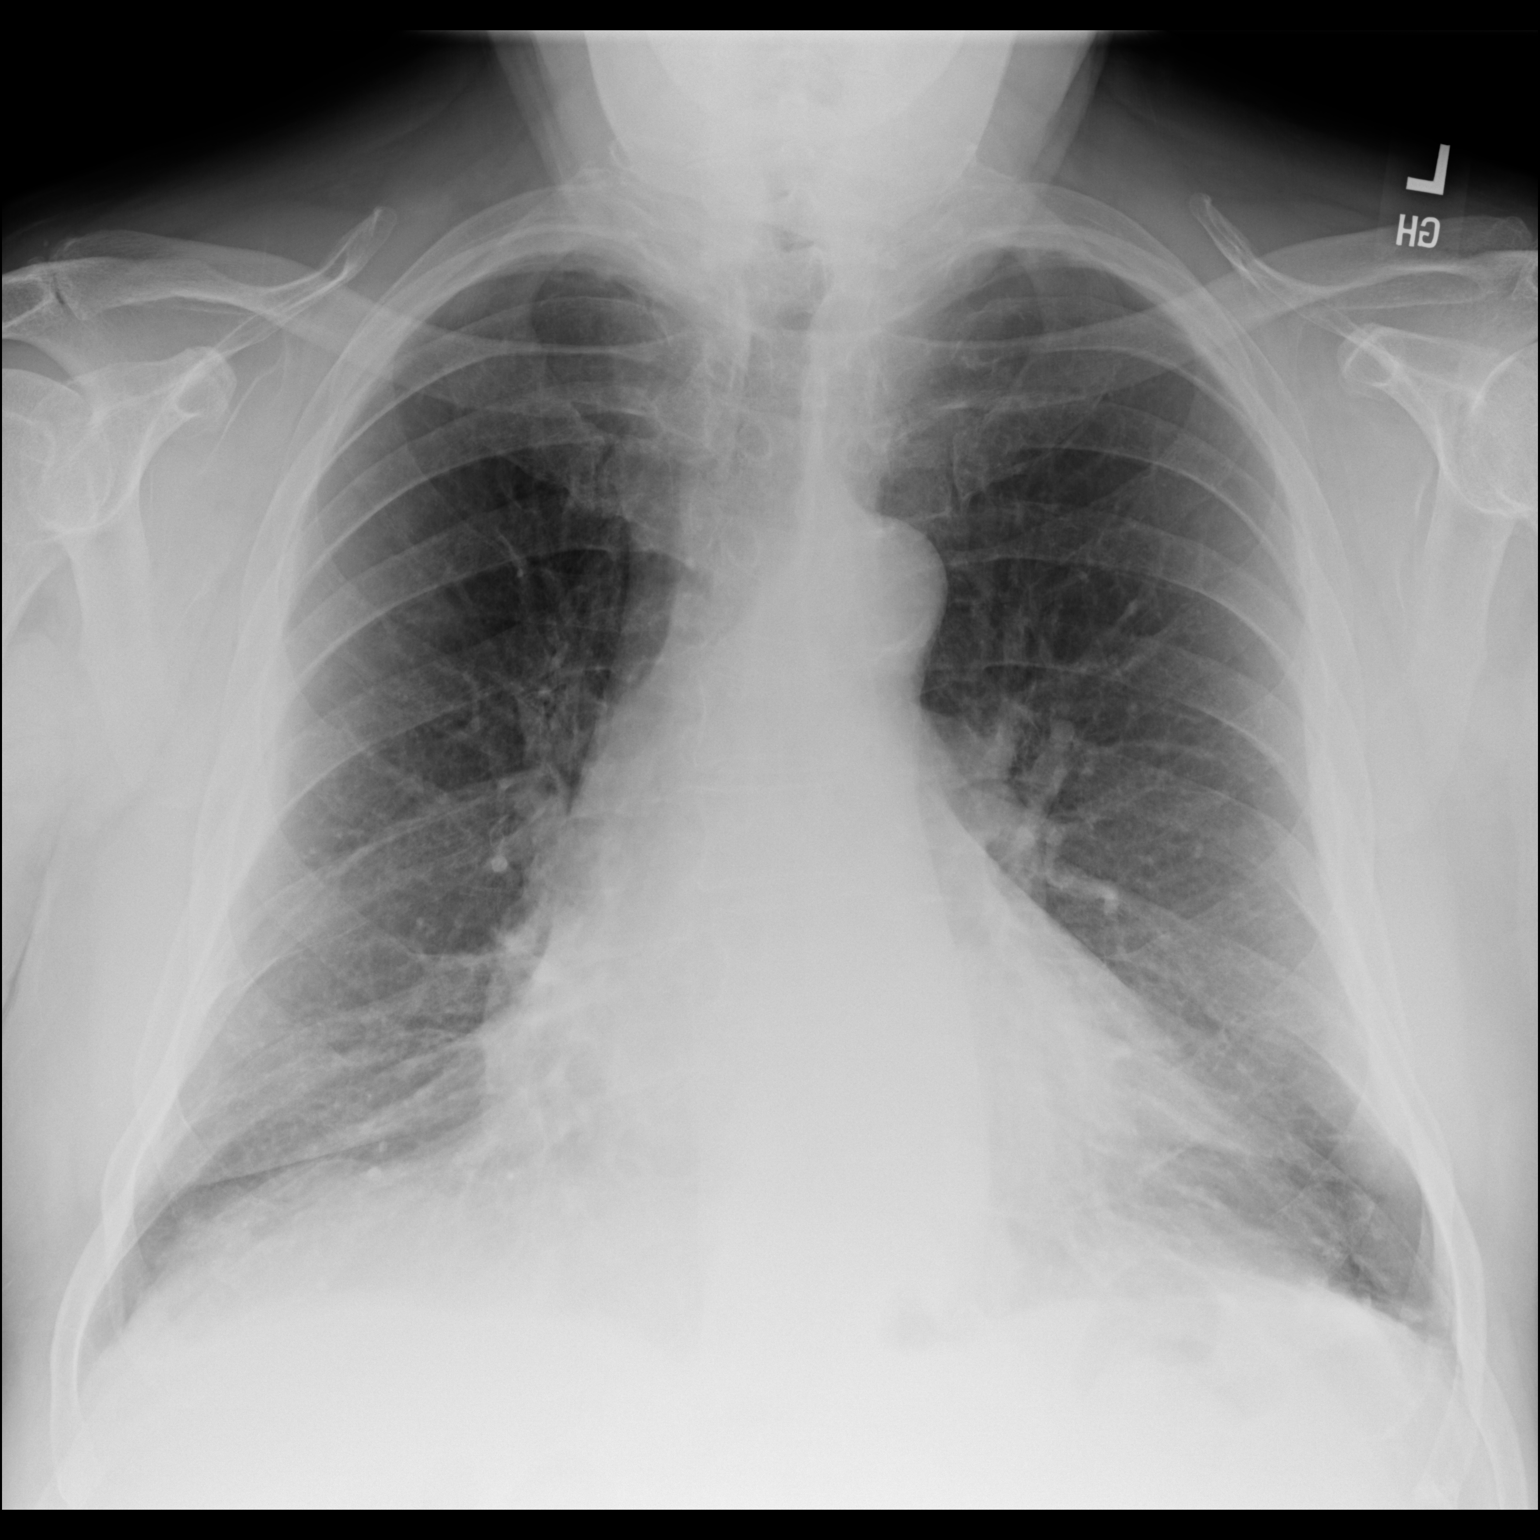

[dg chest 2 view (2 of 2)]
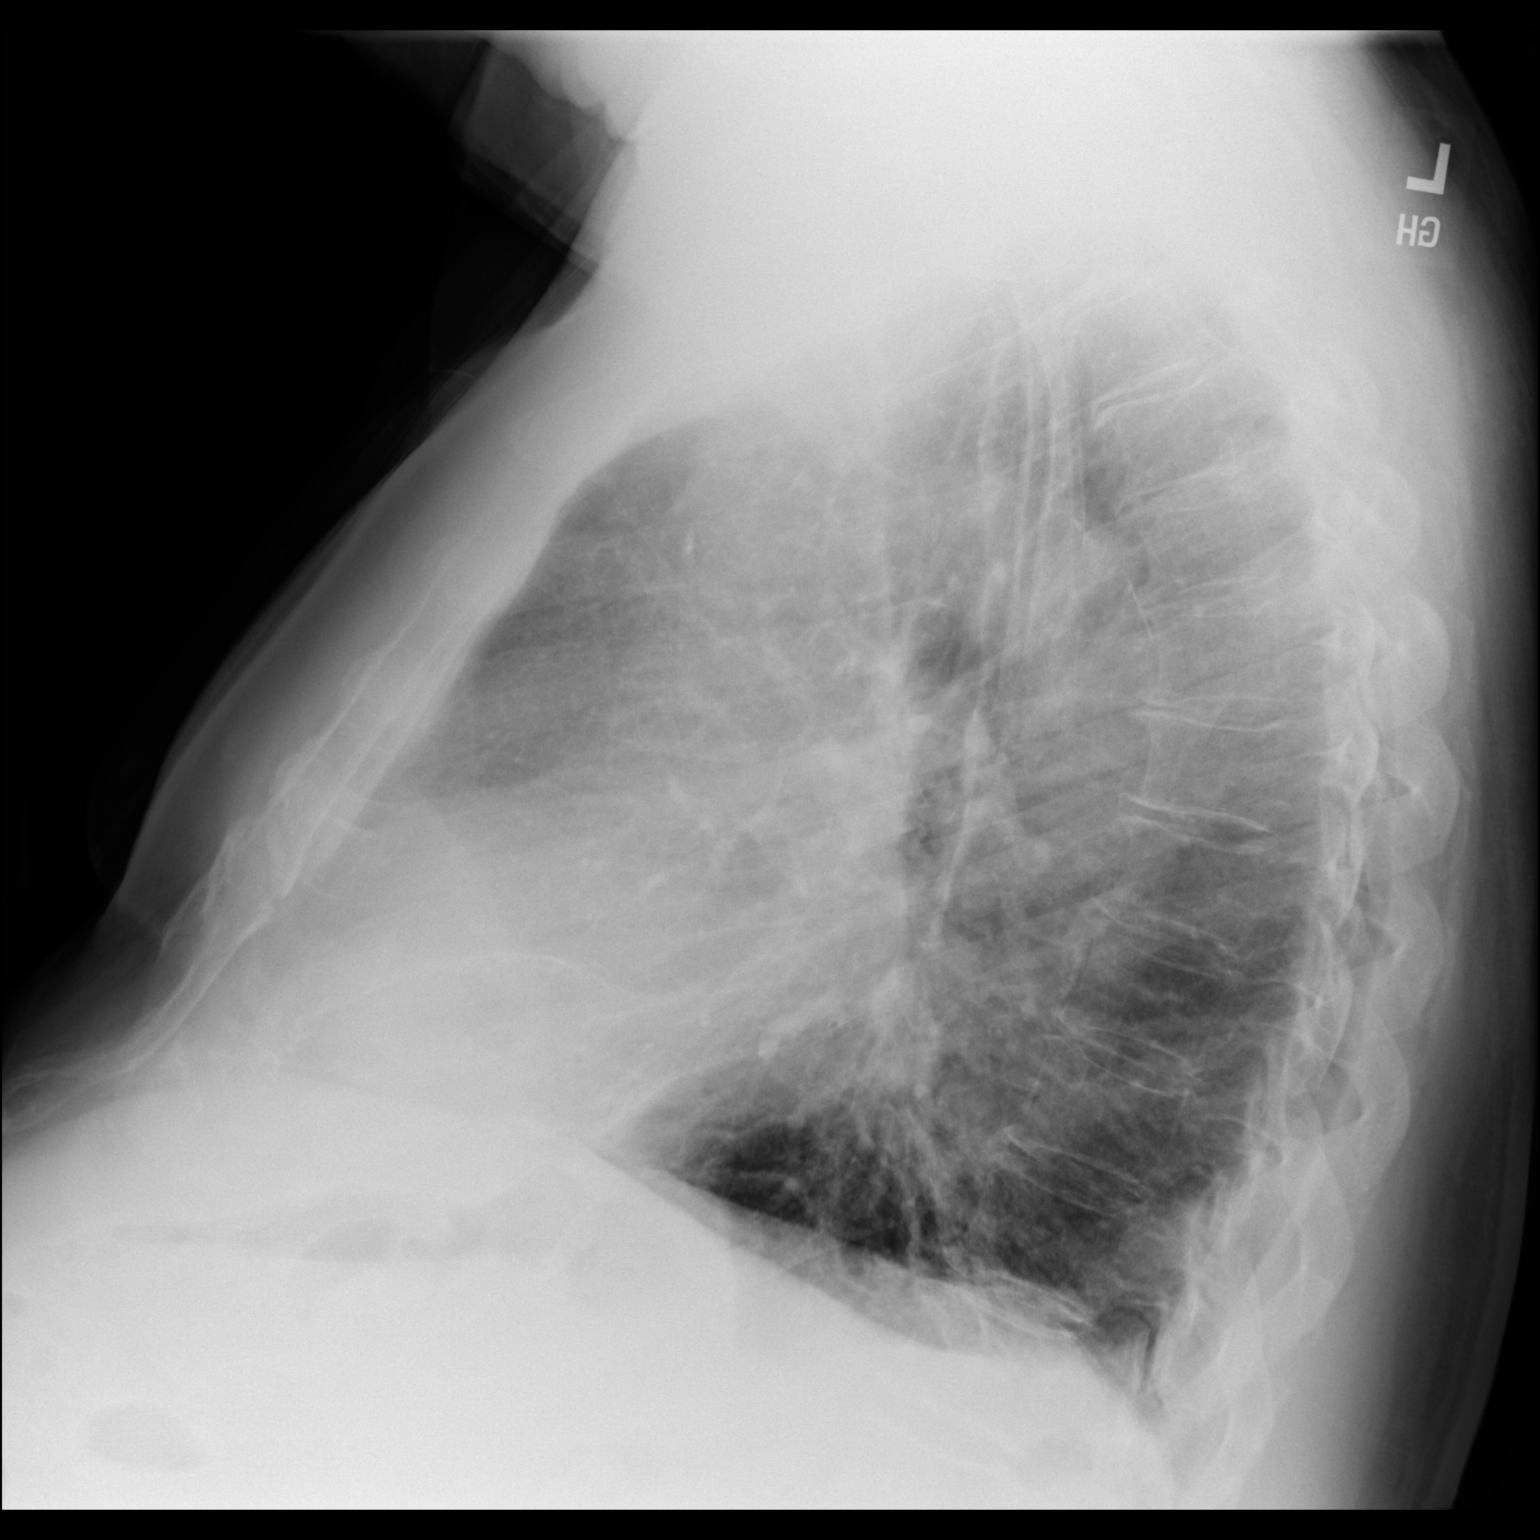

[2 of 2 positions shown; findings below may reference images not displayed]

FINDINGS: Cardiomediastinal silhouette unchanged in size and contour. No
evidence of central vascular congestion. No interlobular septal
thickening.

Stigmata of emphysema, with increased retrosternal airspace,
flattened hemidiaphragms, increased AP diameter, and hyperinflation
on the AP view.

No pneumothorax or pleural effusion. Coarsened interstitial
markings, with no confluent airspace disease.

No acute displaced fracture. Degenerative changes of the spine.
IMPRESSION: Emphysema and chronic changes without definite evidence of acute
cardiopulmonary disease

## 2023-10-19 DIAGNOSIS — H903 Sensorineural hearing loss, bilateral: Secondary | ICD-10-CM | POA: Diagnosis not present

## 2024-02-21 DIAGNOSIS — I10 Essential (primary) hypertension: Secondary | ICD-10-CM | POA: Diagnosis not present

## 2024-02-21 DIAGNOSIS — J449 Chronic obstructive pulmonary disease, unspecified: Secondary | ICD-10-CM | POA: Diagnosis not present

## 2024-02-21 DIAGNOSIS — K219 Gastro-esophageal reflux disease without esophagitis: Secondary | ICD-10-CM | POA: Diagnosis not present

## 2024-02-21 DIAGNOSIS — R7303 Prediabetes: Secondary | ICD-10-CM | POA: Diagnosis not present

## 2024-02-21 DIAGNOSIS — R454 Irritability and anger: Secondary | ICD-10-CM | POA: Diagnosis not present

## 2024-02-21 DIAGNOSIS — J439 Emphysema, unspecified: Secondary | ICD-10-CM | POA: Diagnosis not present

## 2024-02-21 DIAGNOSIS — E7800 Pure hypercholesterolemia, unspecified: Secondary | ICD-10-CM | POA: Diagnosis not present

## 2024-02-21 DIAGNOSIS — I700 Atherosclerosis of aorta: Secondary | ICD-10-CM | POA: Diagnosis not present

## 2024-06-24 ENCOUNTER — Emergency Department (HOSPITAL_COMMUNITY)

## 2024-06-24 ENCOUNTER — Emergency Department (HOSPITAL_COMMUNITY)
Admission: EM | Admit: 2024-06-24 | Discharge: 2024-06-24 | Disposition: A | Discharge status: Home / Self Care | Attending: Emergency Medicine | Admitting: Emergency Medicine

## 2024-06-24 ENCOUNTER — Encounter (HOSPITAL_COMMUNITY): Payer: Self-pay

## 2024-06-24 ENCOUNTER — Other Ambulatory Visit: Payer: Self-pay

## 2024-06-24 DIAGNOSIS — I2510 Atherosclerotic heart disease of native coronary artery without angina pectoris: Secondary | ICD-10-CM | POA: Diagnosis not present

## 2024-06-24 DIAGNOSIS — L03115 Cellulitis of right lower limb: Secondary | ICD-10-CM | POA: Diagnosis not present

## 2024-06-24 DIAGNOSIS — K8020 Calculus of gallbladder without cholecystitis without obstruction: Secondary | ICD-10-CM | POA: Diagnosis not present

## 2024-06-24 DIAGNOSIS — Z720 Tobacco use: Secondary | ICD-10-CM | POA: Diagnosis not present

## 2024-06-24 DIAGNOSIS — J441 Chronic obstructive pulmonary disease with (acute) exacerbation: Secondary | ICD-10-CM | POA: Diagnosis not present

## 2024-06-24 DIAGNOSIS — Z7982 Long term (current) use of aspirin: Secondary | ICD-10-CM | POA: Diagnosis not present

## 2024-06-24 DIAGNOSIS — I10 Essential (primary) hypertension: Secondary | ICD-10-CM | POA: Diagnosis not present

## 2024-06-24 DIAGNOSIS — Z79899 Other long term (current) drug therapy: Secondary | ICD-10-CM | POA: Diagnosis not present

## 2024-06-24 DIAGNOSIS — R609 Edema, unspecified: Secondary | ICD-10-CM | POA: Diagnosis not present

## 2024-06-24 DIAGNOSIS — J439 Emphysema, unspecified: Secondary | ICD-10-CM | POA: Diagnosis not present

## 2024-06-24 DIAGNOSIS — R918 Other nonspecific abnormal finding of lung field: Secondary | ICD-10-CM | POA: Diagnosis not present

## 2024-06-24 DIAGNOSIS — I517 Cardiomegaly: Secondary | ICD-10-CM | POA: Diagnosis not present

## 2024-06-24 DIAGNOSIS — R0602 Shortness of breath: Secondary | ICD-10-CM | POA: Diagnosis not present

## 2024-06-24 DIAGNOSIS — R0609 Other forms of dyspnea: Secondary | ICD-10-CM

## 2024-06-24 LAB — CBC WITH DIFFERENTIAL/PLATELET
Abs Immature Granulocytes: 0.03 K/uL (ref 0.00–0.07)
Basophils Absolute: 0.1 K/uL (ref 0.0–0.1)
Basophils Relative: 1 %
Eosinophils Absolute: 0.1 K/uL (ref 0.0–0.5)
Eosinophils Relative: 1 %
HCT: 50.3 % (ref 39.0–52.0)
Hemoglobin: 17 g/dL (ref 13.0–17.0)
Immature Granulocytes: 0 %
Lymphocytes Relative: 15 %
Lymphs Abs: 1.5 K/uL (ref 0.7–4.0)
MCH: 33.1 pg (ref 26.0–34.0)
MCHC: 33.8 g/dL (ref 30.0–36.0)
MCV: 97.9 fL (ref 80.0–100.0)
Monocytes Absolute: 0.9 K/uL (ref 0.1–1.0)
Monocytes Relative: 9 %
Neutro Abs: 7.2 K/uL (ref 1.7–7.7)
Neutrophils Relative %: 74 %
Platelets: 235 K/uL (ref 150–400)
RBC: 5.14 MIL/uL (ref 4.22–5.81)
RDW: 12.7 % (ref 11.5–15.5)
WBC: 9.8 K/uL (ref 4.0–10.5)
nRBC: 0 % (ref 0.0–0.2)

## 2024-06-24 LAB — BASIC METABOLIC PANEL WITH GFR
Anion gap: 11 (ref 5–15)
BUN: 16 mg/dL (ref 8–23)
CO2: 24 mmol/L (ref 22–32)
Calcium: 8.9 mg/dL (ref 8.9–10.3)
Chloride: 106 mmol/L (ref 98–111)
Creatinine, Ser: 0.89 mg/dL (ref 0.61–1.24)
GFR, Estimated: >60 mL/min (ref >60)
Glucose, Bld: 90 mg/dL (ref 70–99)
Potassium: 4.2 mmol/L (ref 3.5–5.1)
Sodium: 141 mmol/L (ref 135–145)

## 2024-06-24 LAB — TROPONIN I (HIGH SENSITIVITY)
Troponin I (High Sensitivity): 7 ng/L (ref <18)
Troponin I (High Sensitivity): 5 ng/L (ref <18)

## 2024-06-24 LAB — BRAIN NATRIURETIC PEPTIDE: B Natriuretic Peptide: 57 pg/mL (ref 0.0–100.0)

## 2024-06-24 LAB — D-DIMER, QUANTITATIVE: D-Dimer, Quant: 1.01 ug{FEU}/mL — ABNORMAL HIGH (ref 0.00–0.50)

## 2024-06-24 MED ORDER — CEPHALEXIN 250 MG PO CAPS
500.0000 mg | ORAL_CAPSULE | Freq: Once | ORAL | Status: AC
  Administered 2024-06-24: 500 mg via ORAL
  Filled 2024-06-24: qty 2

## 2024-06-24 MED ORDER — CEPHALEXIN 500 MG PO CAPS: 500.0000 mg | ORAL_CAPSULE | Freq: Four times a day (QID) | ORAL | 0 refills | Status: DC

## 2024-06-24 MED ORDER — IOHEXOL 350 MG/ML SOLN
75.0000 mL | Freq: Once | INTRAVENOUS | Status: AC | PRN
  Administered 2024-06-24: 75 mL via INTRAVENOUS

## 2024-06-24 NOTE — ED Triage Notes (Signed)
 Pt reports 2 weeks of right foot swelling and redness that radiates up to his calf, denies pain. Pt also reports some SOB. No hx of blood clots. Pedal pulse found with doppler.

## 2024-06-24 NOTE — Progress Notes (Signed)
 RLE venous exam is completed. Hason Ofarrell, RVT

## 2024-06-24 NOTE — ED Notes (Signed)
 CCMD called.

## 2024-06-24 NOTE — ED Notes (Signed)
 The patient verbalized understanding of d/c instructions, prescription and follow up care. Pt wheeled out of ED via wheelchair.

## 2024-06-24 NOTE — ED Notes (Signed)
 The patient was able to ambulate around the nurses station twice with independent steady gait. O2 sats maintained between 94-96% on room air and HR was 88.

## 2024-06-24 NOTE — Discharge Instructions (Addendum)
 You were seen in the emergency room for worsening shortness of breath and leg swelling.  We screened you for a blood clot in the leg and lungs.  The CT scan of your lung does not show any evidence of pneumonia, blood clot.  It shows chronic scarring from COPD.  The ultrasound does not show any definitive findings concerning for clot.  However, the ultrasound quality was not the best, therefore, we recommend that you follow-up with your primary care doctor.  Start taking the antibiotics that are prescribed.  If the swelling in the leg does not improve with the antibiotics, then your PCP will have to order repeat ultrasound.  We also recommend following up with PCP about the shortness of breath you are experiencing, so that they can determine if you need to see cardiologist or pulmonary service.  Finally, we noticed that you have diminished pulse in your leg.  If the primary care doctor has concerns about vascular insufficiency, they probably also need to refer you to vascular surgeon.  Return to the emergency room if your symptoms get worse.

## 2024-06-24 NOTE — ED Provider Notes (Addendum)
 [place] EMERGENCY DEPARTMENT AT [facility] Provider Note   CSN: [id number] Arrival date & time: 06/24/24  1401     Patient presents with: Foot Swelling and Shortness of Breath   Brandon [name] is a 83 y.o. male.   HPI     83 year old patient comes in with chief complaint of shortness of breath. Patient accompanied by his sisters.  Patient has past medical history of AAA s/p repair, COPD, hypertension.  According to the patient's sister, in the last 2 weeks, patient started developing increased redness and swelling to the right leg.  Additionally they have also noted that patient has worsening exertional shortness of breath.  Patient denies any new cough.  He does not have any orthopnea or night time symptoms of shortness of breath.  He states that walking from his door to the car will get him winded now, which was not the case in the beginning of summer.  Pt has no hx of PE, DVT and denies any exogenous hormone (testosterone / estrogen) use, long distance travels or surgery in the past 6 weeks, active cancer, recent immobilization.  Patient denies any blood loss, chest pain.  Prior to Admission medications   Medication Sig Start Date End Date Taking? Authorizing Provider  cephALEXin  (KEFLEX ) 500 MG capsule Take 1 capsule (500 mg total) by mouth 4 (four) times daily. 06/24/24  Yes Charlyn Sora, MD  albuterol  (VENTOLIN  HFA) 108 (90 Base) MCG/ACT inhaler Inhale 2 puffs into the lungs every 4 (four) hours as needed for wheezing or shortness of breath. 12/08/20   Armenta Canning, MD  amLODipine  (NORVASC ) 5 MG tablet Take 1 tablet (5 mg total) by mouth daily. 02/03/23 01/29/24  Ladona Heinz, MD  aspirin 81 MG chewable tablet Chew 81 mg by mouth daily.    [provider]  atorvastatin  (LIPITOR) 20 MG tablet Take 1 tablet (20 mg total) by mouth daily. 08/23/23   Ladona Heinz, MD  citalopram  (CELEXA ) 20 MG tablet TAKE 1 TABLET BY MOUTH EVERY DAY 05/26/18   Levora Reyes SAUNDERS, MD  OVER THE COUNTER MEDICATION Take 1 capsule by mouth daily. Juice Plus fruit blend    [provider]  OVER THE COUNTER MEDICATION Take 1 capsule by mouth daily in the afternoon. Juice Plus vegetable blend    [provider]  Spacer/Aero-Holding Chambers (AEROCHAMBER PLUS WITH MASK) inhaler Use as instructed 12/08/20   Armenta Canning, MD  TRELEGY ELLIPTA 200-62.5-25 MCG/ACT AEPB Inhale 1 puff into the lungs daily.    [provider]    Allergies: Umeclidinium-vilanterol    Review of Systems  All other systems reviewed and are negative.   Updated Vital Signs BP (!) 156/94   Pulse 68   Temp 98 F (36.7 C) (Oral)   Resp 16   SpO2 98%   Physical Exam Vitals and nursing note reviewed.  Constitutional:      Appearance: He is well-developed.  HENT:     Head: Atraumatic.  Cardiovascular:     Rate and Rhythm: Normal rate.  Pulmonary:     Effort: Pulmonary effort is normal.     Breath sounds: No wheezing, rhonchi or rales.  Musculoskeletal:     Cervical back: Neck supple.  Skin:    General: Skin is warm.  Neurological:     Mental Status: He is alert and oriented to person, place, and time.     (all labs ordered are listed, but only abnormal results are displayed) Labs Reviewed  D-DIMER,  QUANTITATIVE - Abnormal; Notable for the following components:      Result Value   D-Dimer, Quant 1.01 (*)    All other components within normal limits  BASIC METABOLIC PANEL WITH GFR  CBC WITH DIFFERENTIAL/PLATELET  BRAIN NATRIURETIC PEPTIDE  TROPONIN I (HIGH SENSITIVITY)  TROPONIN I (HIGH SENSITIVITY)    EKG: EKG Interpretation Date/Time:  Sunday June 24 2024 14:23:19 EDT Ventricular Rate:  70 PR Interval:  144 QRS Duration:  122 QT Interval:  426 QTC Calculation: 460 R Axis:   -23  Text Interpretation: Sinus rhythm with occasional Premature ventricular complexes Right bundle branch block Inferior infarct , age undetermined Abnormal ECG When  compared with ECG of 23-Aug-2023 16:47, PREVIOUS ECG IS PRESENT Confirmed by Charlyn Sora ([id number]) on 06/24/2024 3:18:58 PM  Radiology: CT Angio Chest PE W and/or Wo Contrast Result Date: 06/24/2024 CLINICAL DATA:  Pulmonary embolism suspected, low to intermediate probability. Positive D-dimer. EXAM: CT ANGIOGRAPHY CHEST WITH CONTRAST TECHNIQUE: Multidetector CT imaging of the chest was performed using the standard protocol during bolus administration of intravenous contrast. Multiplanar CT image reconstructions and MIPs were obtained to evaluate the vascular anatomy. RADIATION DOSE REDUCTION: This exam was performed according to the departmental dose-optimization program which includes automated exposure control, adjustment of the mA and/or kV according to patient size and/or use of iterative reconstruction technique. CONTRAST:  75mL OMNIPAQUE  IOHEXOL  350 MG/ML SOLN COMPARISON:  08/17/2023. FINDINGS: Cardiovascular: The heart is borderline enlarged and there is a trace pericardial effusion. Multi-vessel coronary artery calcifications are noted. There is atherosclerotic calcification the aorta a without evidence of aneurysm. The pulmonary trunk is normal in caliber. No evidence of pulmonary embolism is seen. Mediastinum/Nodes: No enlarged mediastinal, hilar, or axillary lymph nodes. Thyroid  gland, trachea, and esophagus demonstrate no significant findings. There is a small hiatal hernia. Lungs/Pleura: Paraseptal and centrilobular emphysematous changes are present in the lungs. Strandy atelectasis or infiltrate is noted at the lung bases. No effusion or pneumothorax is seen. Stable nodules are present in the left lower lobe measuring up to 4 mm, axial image 84. Upper Abdomen: There is fatty infiltration of the liver. Hyperdense material is noted in the dependent portion of the gallbladder, possible stones or sludge. No acute abnormality. Musculoskeletal: Degenerative changes are present in the thoracic spine.  Scattered old healed fracture deformities of the sternum and right ribs. No acute osseous abnormality is seen. Review of the MIP images confirms the above findings. IMPRESSION: 1. No evidence of pulmonary embolism. 2. Strandy atelectasis or infiltrate at the lung bases. 3. Stable left pulmonary nodules measuring up to 4 mm. 4. Emphysema. 5. Cholelithiasis. 6. Hepatic steatosis. 7. Coronary artery calcifications and aortic atherosclerosis. Electronically Signed   By: Leita Birmingham M.D.   On: 06/24/2024 17:47   VAS US  LOWER EXTREMITY VENOUS (DVT) (7a-7p) Result Date: 06/24/2024  Lower Venous DVT Study Patient Name:  KELVEN FLATER  Date of Exam:   06/24/2024 Medical Rec #: [id number]        Accession #:    [id number] Date of Birth: Dec 28, 1940        Patient Gender: M Patient Age:   37 years Exam Location:  [facility] Procedure:      VAS US  LOWER EXTREMITY VENOUS (DVT) Referring Phys: SORA Davaun Quintela --------------------------------------------------------------------------------  Indications: Edema.  Performing Technologist: Elmarie Lindau, RVT  Examination Guidelines: A complete evaluation includes B-mode imaging, spectral Doppler, color Doppler, and power Doppler as needed of all accessible portions of each vessel. Bilateral testing is considered  an integral part of a complete examination. Limited examinations for reoccurring indications may be performed as noted. The reflux portion of the exam is performed with the patient in reverse Trendelenburg.  +---------+---------------+---------+-----------+----------+-------------------+ RIGHT    CompressibilityPhasicitySpontaneityPropertiesThrombus Aging      +---------+---------------+---------+-----------+----------+-------------------+ CFV      Full           Yes      Yes                                      +---------+---------------+---------+-----------+----------+-------------------+ SFJ      Full                                                              +---------+---------------+---------+-----------+----------+-------------------+ FV Prox  Full                                                             +---------+---------------+---------+-----------+----------+-------------------+ FV Mid   Full                                                             +---------+---------------+---------+-----------+----------+-------------------+ FV DistalFull                                                             +---------+---------------+---------+-----------+----------+-------------------+ PFV      Full                                                             +---------+---------------+---------+-----------+----------+-------------------+ POP      Full           Yes      Yes                                      +---------+---------------+---------+-----------+----------+-------------------+ PTV      Full                                                             +---------+---------------+---------+-----------+----------+-------------------+ PERO  Not well visualized +---------+---------------+---------+-----------+----------+-------------------+   +----+---------------+---------+-----------+----------+------------------------+ LEFTCompressibilityPhasicitySpontaneityPropertiesThrombus Aging           +----+---------------+---------+-----------+----------+------------------------+ CFV                                              pt didn't want to take                                                    off shorts on and                                                         difficult to get in                                                       groin.                   +----+---------------+---------+-----------+----------+------------------------+    Summary: RIGHT: - There is no evidence of deep vein thrombosis  in the lower extremity. However, portions of this examination were limited- see technologist comments above.  - No cystic structure found in the popliteal fossa.   *See table(s) above for measurements and observations.    Preliminary    DG Chest Port 1 View Result Date: 06/24/2024 CLINICAL DATA:  Shortness of breath EXAM: PORTABLE CHEST 1 VIEW COMPARISON:  08/14/2022 FINDINGS: Cardiac shadow is enlarged. The lungs are clear bilaterally. No acute bony abnormality is noted. IMPRESSION: No acute abnormality noted Electronically Signed   By: Oneil Devonshire M.D.   On: 06/24/2024 15:45     Procedures   Medications Ordered in the ED  iohexol  (OMNIPAQUE ) 350 MG/ML injection 75 mL (75 mLs Intravenous Contrast Given 06/24/24 1730)  cephALEXin  (KEFLEX ) capsule 500 mg (500 mg Oral Given 06/24/24 2029)    Clinical Course as of 06/24/24 2206  Sun Jun 24, 2024  2039 Patient reassessed.  Results of the ER workup discussed with him. BNP is normal.  Troponin x 2 are reassuring.  Patient has diminished dorsalis pedis.  With the Doppler, we have equal dopplerable pulse, but it is faint.  We are questioning if patient might have some arterial insufficiency.  I do not know if he needs ABIs right now or vascular surgery follow-up.  It is clear that he does not have critical limb ischemia at this time.  Skin is warm to touch and red and not dusky.  Return precautions have been discussed. [AN]  2040 The sisters are more comfortable with us  sending a message to the primary care doctor about possible consultations and outpatient follow-up.  I think that is likely the best scenario as well.  I will send a separate message to patient's PCP. [AN]    Clinical Course User Index [AN] Charlyn Sora, MD  Medical Decision Making Amount and/or Complexity of Data Reviewed Labs: ordered. Radiology: ordered.  Risk Prescription drug management.   83 year old patient comes in with chief  complaint of shortness of breath.  Patient has history of AAA, status post repair, COPD and prior tobacco use disorder.  He has chronic dyspnea.  No known CAD and patient has preserved EF per echocardiogram from 07-2022.  It appears that he has chronic dyspnea, but more recently the shortness of breath has worsened.  Collateral history provided by patient's sisters.  I also reviewed patient's records including echocardiogram, cardiology note in the outpatient setting and CT dissection study from 2 years ago.  Differential diagnosis for this patient includes pulmonary embolism, acute DVT, worsening COPD, CHF, valvular disorder and acute coronary syndrome, severe anemia.  Plan is to get basic labs, D-dimer and ultrasound DVT.  We will also get cardiac biomarkers.    10:06 PM Patient's ultrasound is negative.  The study is suboptimal. CT scan is negative for blood clot, pneumonia.  Results of the ER workup discussed with the patient.  He is made aware, that the ultrasound is suboptimal, therefore if his swelling does not get better with antibiotics, then he needs to see his PCP who will likely have to order a repeat ultrasound if there is high enough suspicion for DVT.  On my exam, patient has dopplerable pulse in the lower extremity only.  I doubt that he has acute arterial insufficiency, but chronic insufficiency is possible.  I have sent a message to patient's PCP upon the request.  The patient appears reasonably screened and/or stabilized for discharge and I doubt any other medical condition or other [facility] requiring further screening, evaluation, or treatment in the ED at this time prior to discharge.   Results from the ER workup discussed with the patient face to face and all questions answered to the best of my ability. The patient is safe for discharge with strict return precautions.  10:13 PM Turns out, I do not have ability to send an epic inbox message to the PCP.  However, my discharge  summary was pretty comprehensive.  Final diagnoses:  Exertional dyspnea  COPD exacerbation (HCC)  Cellulitis of right lower extremity    ED Discharge Orders          Ordered    cephALEXin  (KEFLEX ) 500 MG capsule  4 times daily        06/24/24 2039                Charlyn Sora, MD 06/24/24 2210    Charlyn Sora, MD 06/24/24 2213

## 2024-06-25 DIAGNOSIS — R454 Irritability and anger: Secondary | ICD-10-CM | POA: Diagnosis not present

## 2024-06-25 DIAGNOSIS — I739 Peripheral vascular disease, unspecified: Secondary | ICD-10-CM | POA: Diagnosis not present

## 2024-06-25 DIAGNOSIS — L03115 Cellulitis of right lower limb: Secondary | ICD-10-CM | POA: Diagnosis not present

## 2024-06-25 DIAGNOSIS — K219 Gastro-esophageal reflux disease without esophagitis: Secondary | ICD-10-CM | POA: Diagnosis not present

## 2024-06-25 DIAGNOSIS — E7800 Pure hypercholesterolemia, unspecified: Secondary | ICD-10-CM | POA: Diagnosis not present

## 2024-06-25 DIAGNOSIS — J449 Chronic obstructive pulmonary disease, unspecified: Secondary | ICD-10-CM | POA: Diagnosis not present

## 2024-06-25 DIAGNOSIS — I10 Essential (primary) hypertension: Secondary | ICD-10-CM | POA: Diagnosis not present

## 2024-07-24 ENCOUNTER — Other Ambulatory Visit: Payer: Self-pay

## 2024-07-24 DIAGNOSIS — I713 Abdominal aortic aneurysm, ruptured, unspecified: Secondary | ICD-10-CM

## 2024-08-03 DIAGNOSIS — R4189 Other symptoms and signs involving cognitive functions and awareness: Secondary | ICD-10-CM | POA: Diagnosis not present

## 2024-08-03 DIAGNOSIS — J441 Chronic obstructive pulmonary disease with (acute) exacerbation: Secondary | ICD-10-CM | POA: Diagnosis not present

## 2024-08-06 ENCOUNTER — Encounter: Payer: Self-pay | Admitting: Physician Assistant

## 2024-08-20 DIAGNOSIS — Z23 Encounter for immunization: Secondary | ICD-10-CM | POA: Diagnosis not present

## 2024-08-20 DIAGNOSIS — R7303 Prediabetes: Secondary | ICD-10-CM | POA: Diagnosis not present

## 2024-08-20 DIAGNOSIS — Z0000 Encounter for general adult medical examination without abnormal findings: Secondary | ICD-10-CM | POA: Diagnosis not present

## 2024-08-20 DIAGNOSIS — I10 Essential (primary) hypertension: Secondary | ICD-10-CM | POA: Diagnosis not present

## 2024-08-20 DIAGNOSIS — J439 Emphysema, unspecified: Secondary | ICD-10-CM | POA: Diagnosis not present

## 2024-08-20 DIAGNOSIS — E7800 Pure hypercholesterolemia, unspecified: Secondary | ICD-10-CM | POA: Diagnosis not present

## 2024-08-20 DIAGNOSIS — K219 Gastro-esophageal reflux disease without esophagitis: Secondary | ICD-10-CM | POA: Diagnosis not present

## 2024-08-20 DIAGNOSIS — J449 Chronic obstructive pulmonary disease, unspecified: Secondary | ICD-10-CM | POA: Diagnosis not present

## 2024-08-20 DIAGNOSIS — Z1331 Encounter for screening for depression: Secondary | ICD-10-CM | POA: Diagnosis not present

## 2024-08-22 ENCOUNTER — Ambulatory Visit: Admitting: Vascular Surgery

## 2024-08-22 ENCOUNTER — Encounter: Payer: Self-pay | Admitting: Vascular Surgery

## 2024-08-22 ENCOUNTER — Ambulatory Visit (HOSPITAL_COMMUNITY)
Admission: RE | Admit: 2024-08-22 | Discharge: 2024-08-22 | Disposition: A | Discharge status: Home / Self Care | Source: Ambulatory Visit | Attending: Vascular Surgery | Admitting: Vascular Surgery

## 2024-08-22 VITALS — BP 119/70 | HR 66 | Temp 98.1°F | Ht 70.0 in | Wt 259.9 lb

## 2024-08-22 DIAGNOSIS — I713 Abdominal aortic aneurysm, ruptured, unspecified: Secondary | ICD-10-CM | POA: Insufficient documentation

## 2024-08-22 DIAGNOSIS — Z9889 Other specified postprocedural states: Secondary | ICD-10-CM

## 2024-08-22 NOTE — Progress Notes (Signed)
 Patient ID: Brandon [name], male   DOB: [birth year], 83 y.o.   MRN: [id number]  Reason for Consult: New Patient (Initial Visit)   Referred by Wonda Worth SQUIBB, PA  Subjective:     HPI:  Brandon [name] is a 83 y.o. 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  He was complaining of leg pain and he is now sent here for evaluation of claudication.  He states that his leg pain occurs with sitting and/or activity.  He does not have any frank claudication.  He denies rest pain or tissue loss.  He does have risk factors of COPD, hyperlipidemia and hypertension.  He is a former smoker quit in 2013.  Past Medical History:  Diagnosis Date   AAA (abdominal aortic aneurysm)    COPD (chronic obstructive pulmonary disease) (HCC)    DIC (disseminated intravascular coagulation) 09/09/2012   Hyperlipidemia    Hypertension    Leg wound, left 12/12/2013   Peripheral vascular disease    Respiratory failure, post-operative 09/09/2012   Shortness of breath    Family History  Problem Relation Age of Onset   Dementia Mother    Cancer Mother    Cancer Father    Heart disease Father    Heart attack Sister    Cancer Sister    Heart disease Brother    Past Surgical History:  Procedure Laterality Date   ABDOMINAL AORTIC ANEURYSM REPAIR  09/08/2012   Procedure: ANEURYSM ABDOMINAL AORTIC REPAIR;  Surgeon: Lynwood JONETTA Collum, MD;  Location: [facility] OR;  Service: Vascular;  Laterality: N/A;  open repair ruptured abdomenal aortic aneurysm   CYSTOSCOPY W/ RETROGRADES Bilateral 01/24/2023   Procedure: FLEXIBLE CYSTOSCOPY;  Surgeon: Carolee Sherwood JONETTA DOUGLAS, MD;  Location: [facility] ORS;  Service: Urology;  Laterality: Bilateral;  60 MINS   GANGLION CYST EXCISION     Right wrist   MASS EXCISION N/A 01/24/2023   Procedure: EXCISION PENILE  MASS;  Surgeon: Carolee Sherwood JONETTA DOUGLAS, MD;  Location: [facility] ORS;  Service: Urology;  Laterality: N/A;    Short Social History:  Social History    Tobacco Use   Smoking status: Former    Current packs/day: 0.00    Average packs/day: 1 pack/day for 60.0 years (60.0 ttl pk-yrs)    Types: Cigarettes    Start date: 09/08/1952    Quit date: 09/08/2012    Years since quitting: 11.9   Smokeless tobacco: Never  Substance Use Topics   Alcohol use: No    Alcohol/week: 0.0 standard drinks of alcohol    Allergies  Allergen Reactions   Umeclidinium-Vilanterol Other (See Comments)    Unknown reaction - patient not aware of being allergic to this    Current Outpatient Medications  Medication Sig Dispense Refill   albuterol  (VENTOLIN  HFA) 108 (90 Base) MCG/ACT inhaler Inhale 2 puffs into the lungs every 4 (four) hours as needed for wheezing or shortness of breath. 6.7 g 1   amLODipine  (NORVASC ) 5 MG tablet Take 1 tablet (5 mg total) by mouth daily. 90 tablet 3   aspirin 81 MG chewable tablet Chew 81 mg by mouth daily.     atorvastatin  (LIPITOR) 20 MG tablet Take 1 tablet (20 mg total) by mouth daily. 90 tablet 3   cephALEXin  (KEFLEX ) 500 MG capsule Take 1 capsule (500 mg total) by mouth 4 (four) times daily. 28 capsule 0   citalopram  (CELEXA ) 20 MG tablet TAKE 1 TABLET BY MOUTH EVERY DAY 90  tablet 0   OVER THE COUNTER MEDICATION Take 1 capsule by mouth daily. Juice Plus fruit blend     OVER THE COUNTER MEDICATION Take 1 capsule by mouth daily in the afternoon. Juice Plus vegetable blend     Spacer/Aero-Holding Chambers (AEROCHAMBER PLUS WITH MASK) inhaler Use as instructed 1 each 2   TRELEGY ELLIPTA 200-62.5-25 MCG/ACT AEPB Inhale 1 puff into the lungs daily.     No current facility-administered medications for this visit.    Review of Systems  Constitutional:  Constitutional negative. HENT: HENT negative.  Eyes: Eyes negative.  Cardiovascular: Positive for leg swelling.  GI: Gastrointestinal negative.  Musculoskeletal: Positive for leg pain.  Neurological: Neurological negative. Hematologic: Hematologic/lymphatic negative.   Psychiatric: Psychiatric negative.        Objective:  Objective   Vitals:   08/22/24 1108  BP: 119/70  Pulse: 66  Temp: 98.1 F (36.7 C)  SpO2: 92%  Weight: 259 lb 14.4 oz (117.9 kg)  Height: 5' 10 (1.778 m)   Body mass index is 37.29 kg/m.  Physical Exam Constitutional:      Appearance: He is obese.  HENT:     Head: Normocephalic.     Right Ear: Tympanic membrane normal.     Mouth/Throat:     Mouth: Mucous membranes are moist.  Neck:     Vascular: No carotid bruit.  Cardiovascular:     Rate and Rhythm: Normal rate.     Pulses:          Popliteal pulses are 3+ on the left side.       Posterior tibial pulses are 2+ on the right side and 2+ on the left side.  Pulmonary:     Effort: Pulmonary effort is normal.  Musculoskeletal:     Cervical back: Normal range of motion.  Neurological:     Mental Status: He is alert.     Data: Abdominal Aorta Findings:  +-----------+-------+----------+----------+--------+--------+--------+  Location  AP (cm)Trans (cm)PSV (cm/s)WaveformThrombusComments  +-----------+-------+----------+----------+--------+--------+--------+  Proximal  2.31   2.56      60                                  +-----------+-------+----------+----------+--------+--------+--------+  Mid       2.90   2.36      77                                  +-----------+-------+----------+----------+--------+--------+--------+  Distal    2.65   2.70      63                                  +-----------+-------+----------+----------+--------+--------+--------+  RT CIA Prox0.0                                        NV        +-----------+-------+----------+----------+--------+--------+--------+  RT CIA Mid 0.0                                        NV        +-----------+-------+----------+----------+--------+--------+--------+  Assessment/Plan:     83 year old male with history of ruptured abdominal aortic  aneurysm.  He has leg pain which is likely multifactorial in the nature with palpable distal pulses.  He does have an enlarged left popliteal pulse.  As such I will follow him up in 6 months with ABIs and evaluate his bilateral popliteal arteries with duplex given history of aneurysm disease.  I recommend he continue walking.  He demonstrates good understanding we will follow-up in 6 months.     Penne Lonni Colorado MD [facility]

## 2024-08-23 ENCOUNTER — Other Ambulatory Visit: Payer: Self-pay | Admitting: *Deleted

## 2024-08-23 DIAGNOSIS — I70219 Atherosclerosis of native arteries of extremities with intermittent claudication, unspecified extremity: Secondary | ICD-10-CM

## 2024-09-04 ENCOUNTER — Ambulatory Visit (INDEPENDENT_AMBULATORY_CARE_PROVIDER_SITE_OTHER)

## 2024-09-04 VITALS — BP 126/81 | HR 65 | Temp 98.0°F | Ht 71.0 in | Wt 266.4 lb

## 2024-09-04 DIAGNOSIS — R053 Chronic cough: Secondary | ICD-10-CM

## 2024-09-04 DIAGNOSIS — E669 Obesity, unspecified: Secondary | ICD-10-CM | POA: Diagnosis not present

## 2024-09-04 DIAGNOSIS — J439 Emphysema, unspecified: Secondary | ICD-10-CM

## 2024-09-04 DIAGNOSIS — R0602 Shortness of breath: Secondary | ICD-10-CM | POA: Diagnosis not present

## 2024-09-04 DIAGNOSIS — R0683 Snoring: Secondary | ICD-10-CM | POA: Diagnosis not present

## 2024-09-04 DIAGNOSIS — J4489 Other specified chronic obstructive pulmonary disease: Secondary | ICD-10-CM

## 2024-09-04 MED ORDER — BREZTRI AEROSPHERE 160-9-4.8 MCG/ACT IN AERO: 2.0000 | INHALATION_SPRAY | Freq: Two times a day (BID) | RESPIRATORY_TRACT | 6 refills | Status: AC
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
## 2024-09-04 NOTE — Addendum Note (Signed)
 Addended by: Swannie Milius M on: 09/04/2024 01:31 PM   Modules accepted: Orders

## 2024-09-04 NOTE — Addendum Note (Signed)
 Addended by: Norleen Xie M on: 09/04/2024 02:15 PM   Modules accepted: Orders
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
## 2024-09-05 ENCOUNTER — Telehealth: Payer: Self-pay

## 2024-09-05 NOTE — Telephone Encounter (Signed)
 Per Avelina at [facility] [employer] need to be co-signed by provider please

## 2024-09-05 NOTE — Telephone Encounter (Signed)
 Dr Zaida, please advise signature.

## 2024-09-05 NOTE — Telephone Encounter (Signed)
 DME has been informed. NFN

## 2024-09-27 DIAGNOSIS — I701 Atherosclerosis of renal artery: Secondary | ICD-10-CM | POA: Diagnosis not present

## 2024-09-27 DIAGNOSIS — I719 Aortic aneurysm of unspecified site, without rupture: Secondary | ICD-10-CM | POA: Diagnosis not present

## 2024-09-27 DIAGNOSIS — J439 Emphysema, unspecified: Secondary | ICD-10-CM | POA: Diagnosis not present

## 2024-09-27 DIAGNOSIS — I700 Atherosclerosis of aorta: Secondary | ICD-10-CM | POA: Diagnosis not present

## 2024-09-27 DIAGNOSIS — F325 Major depressive disorder, single episode, in full remission: Secondary | ICD-10-CM | POA: Diagnosis not present

## 2024-09-27 DIAGNOSIS — E785 Hyperlipidemia, unspecified: Secondary | ICD-10-CM | POA: Diagnosis not present

## 2024-09-27 DIAGNOSIS — K219 Gastro-esophageal reflux disease without esophagitis: Secondary | ICD-10-CM | POA: Diagnosis not present

## 2024-09-27 DIAGNOSIS — I739 Peripheral vascular disease, unspecified: Secondary | ICD-10-CM | POA: Diagnosis not present

## 2024-09-27 DIAGNOSIS — Z6836 Body mass index (BMI) 36.0-36.9, adult: Secondary | ICD-10-CM | POA: Diagnosis not present

## 2024-09-27 DIAGNOSIS — I10 Essential (primary) hypertension: Secondary | ICD-10-CM | POA: Diagnosis not present

## 2024-09-27 DIAGNOSIS — I872 Venous insufficiency (chronic) (peripheral): Secondary | ICD-10-CM | POA: Diagnosis not present

## 2024-10-05 ENCOUNTER — Encounter: Payer: Self-pay | Admitting: Physician Assistant

## 2024-10-05 ENCOUNTER — Encounter

## 2024-10-16 ENCOUNTER — Ambulatory Visit: Admitting: *Deleted

## 2024-10-16 DIAGNOSIS — R0602 Shortness of breath: Secondary | ICD-10-CM | POA: Diagnosis not present

## 2024-10-16 DIAGNOSIS — J4489 Other specified chronic obstructive pulmonary disease: Secondary | ICD-10-CM

## 2024-10-16 LAB — PULMONARY FUNCTION TEST
DL/VA % pred: 82 %
DL/VA: 3.18 ml/min/mmHg/L
DLCO cor % pred: 57 %
DLCO cor: 14.09 ml/min/mmHg
DLCO unc % pred: 57 %
DLCO unc: 14.09 ml/min/mmHg
FEF 25-75 Post: 0.64 L/s
FEF 25-75 Pre: 0.7 L/s
FEF2575-%Change-Post: -8 %
FEF2575-%Pred-Post: 32 %
FEF2575-%Pred-Pre: 35 %
FEV1-%Change-Post: -1 %
FEV1-%Pred-Post: 54 %
FEV1-%Pred-Pre: 55 %
FEV1-Post: 1.57 L
FEV1-Pre: 1.59 L
FEV1FVC-%Change-Post: 4 %
FEV1FVC-%Pred-Pre: 78 %
FEV6-%Change-Post: -4 %
FEV6-%Pred-Post: 69 %
FEV6-%Pred-Pre: 73 %
FEV6-Post: 2.66 L
FEV6-Pre: 2.79 L
FEV6FVC-%Change-Post: 0 %
FEV6FVC-%Pred-Post: 105 %
FEV6FVC-%Pred-Pre: 104 %
FVC-%Change-Post: -5 %
FVC-%Pred-Post: 66 %
FVC-%Pred-Pre: 69 %
FVC-Post: 2.69 L
FVC-Pre: 2.85 L
Post FEV1/FVC ratio: 58 %
Post FEV6/FVC ratio: 99 %
Pre FEV1/FVC ratio: 56 %
Pre FEV6/FVC Ratio: 98 %

## 2024-10-16 NOTE — Patient Instructions (Signed)
 Spirometry pre/post and diffusion capacity performed today.

## 2024-10-16 NOTE — Progress Notes (Signed)
 Spirometry pre/post and diffusion capacity performed today. Patient unable to keep pant frequency therefore Pleth not performed.

## 2024-10-17 ENCOUNTER — Ambulatory Visit

## 2024-10-17 VITALS — BP 136/82 | HR 83 | Temp 97.8°F | Ht 71.0 in | Wt 266.0 lb

## 2024-10-17 DIAGNOSIS — R062 Wheezing: Secondary | ICD-10-CM | POA: Diagnosis not present

## 2024-10-17 DIAGNOSIS — J441 Chronic obstructive pulmonary disease with (acute) exacerbation: Secondary | ICD-10-CM

## 2024-10-17 DIAGNOSIS — Z87891 Personal history of nicotine dependence: Secondary | ICD-10-CM | POA: Diagnosis not present

## 2024-10-17 DIAGNOSIS — J4489 Other specified chronic obstructive pulmonary disease: Secondary | ICD-10-CM

## 2024-10-17 DIAGNOSIS — R0602 Shortness of breath: Secondary | ICD-10-CM

## 2024-10-17 MED ORDER — ALBUTEROL SULFATE (2.5 MG/3ML) 0.083% IN NEBU: 2.5000 mg | INHALATION_SOLUTION | Freq: Four times a day (QID) | RESPIRATORY_TRACT | 12 refills | Status: DC | PRN

## 2024-10-17 MED ORDER — METHYLPREDNISOLONE SODIUM SUCC 40 MG IJ SOLR: 40.0000 mg | Freq: Once | INTRAMUSCULAR | Status: DC

## 2024-10-17 MED ORDER — IPRATROPIUM-ALBUTEROL 0.5-2.5 (3) MG/3ML IN SOLN: 3.0000 mL | Freq: Once | RESPIRATORY_TRACT | Status: DC

## 2024-10-17 MED ORDER — METHYLPREDNISOLONE ACETATE 80 MG/ML IJ SUSP: 80.0000 mg | Freq: Once | INTRAMUSCULAR | Status: DC

## 2024-10-17 MED ORDER — PREDNISONE 20 MG PO TABS: 40.0000 mg | ORAL_TABLET | Freq: Every day | ORAL | 0 refills | Status: DC

## 2024-10-17 MED ORDER — AZITHROMYCIN 250 MG PO TABS: ORAL_TABLET | ORAL | 0 refills | Status: DC

## 2024-10-17 NOTE — Addendum Note (Signed)
 Addended by: BURT EVERTT RAMAN on: 10/17/2024 12:06 PM   Modules accepted: Orders

## 2024-10-17 NOTE — Progress Notes (Signed)
 Subjective:   PATIENT ID: Brandon [name] GENDER: male DOB: 24-Nov-1940, MRN: [id number]   HPI Discussed the use of AI scribe software for clinical note transcription with the patient, who gave verbal consent to proceed.  History of Present Illness Brandon [name] is an 83 year old male who presents with worsening wheezing and difficulty breathing.  He has been experiencing worsening wheezing and difficulty breathing since yesterday, with a notable increase in severity today.  He uses two inhalers: Breztri , his maintenance inhaler, two puffs every morning and evening, and albuterol , his rescue inhaler, as needed, currently only in the morning and evening. Despite a previous order, he has not received a nebulizer machine.  He quit smoking in 2013 and has been smoke-free for 13 years. There is remodeling work being done in his kitchen, which involves dust but no chemicals or paint. He is exposed to dust from the remodeling.     Past Medical History:  Diagnosis Date   AAA (abdominal aortic aneurysm)    COPD (chronic obstructive pulmonary disease) (HCC)    DIC (disseminated intravascular coagulation) 09/09/2012   Hyperlipidemia    Hypertension    Leg wound, left 12/12/2013   Peripheral vascular disease    Respiratory failure, post-operative 09/09/2012   Shortness of breath      Family History  Problem Relation Age of Onset   Dementia Mother    Cancer Mother    Cancer Father    Heart disease Father    Heart attack Sister    Cancer Sister    Heart disease Brother      Social History   Socioeconomic History   Marital status: Married    Spouse name: Not on file   Number of children: 0   Years of education: Not on file   Highest education level: Not on file  Occupational History   Not on file  Tobacco Use   Smoking status: Former    Current packs/day: 0.00    Average packs/day: 1 pack/day for 60.0 years (60.0 ttl pk-yrs)    Types: Cigarettes    Start date:  09/08/1952    Quit date: 09/08/2012    Years since quitting: 12.1   Smokeless tobacco: Never  Vaping Use   Vaping status: Never Used  Substance and Sexual Activity   Alcohol use: No    Alcohol/week: 0.0 standard drinks of alcohol   Drug use: No   Sexual activity: Not Currently    Birth control/protection: None  Other Topics Concern   Not on file  Social History Narrative   Not on file   Social Drivers of Health   Financial Resource Strain: Not on file  Food Insecurity: No Food Insecurity (08/15/2022)   Hunger Vital Sign    Worried About Running Out of Food in the Last Year: Never true    Ran Out of Food in the Last Year: Never true  Transportation Needs: No Transportation Needs (08/15/2022)   PRAPARE - Administrator, Civil Service (Medical): No    Lack of Transportation (Non-Medical): No  Physical Activity: Not on file  Stress: Not on file  Social Connections: Not on file  Intimate Partner Violence: Not At Risk (08/15/2022)   Humiliation, Afraid, Rape, and Kick questionnaire    Fear of Current or Ex-Partner: No    Emotionally Abused: No    Physically Abused: No    Sexually Abused: No     Allergies  Allergen Reactions   Umeclidinium-Vilanterol Other (  See Comments)    Unknown reaction - patient not aware of being allergic to this     Outpatient Medications Prior to Visit  Medication Sig Dispense Refill   albuterol  (VENTOLIN  HFA) 108 (90 Base) MCG/ACT inhaler Inhale 2 puffs into the lungs every 4 (four) hours as needed for wheezing or shortness of breath. 6.7 g 1   amLODipine  (NORVASC ) 5 MG tablet Take 1 tablet (5 mg total) by mouth daily. 90 tablet 3   aspirin 81 MG chewable tablet Chew 81 mg by mouth daily.     atorvastatin  (LIPITOR) 20 MG tablet Take 1 tablet (20 mg total) by mouth daily. 90 tablet 3   budesonide-glycopyrrolate-formoterol (BREZTRI  AEROSPHERE) 160-9-4.8 MCG/ACT AERO inhaler Inhale 2 puffs into the lungs in the morning and at bedtime. 10.7 g  6   cephALEXin  (KEFLEX ) 500 MG capsule Take 1 capsule (500 mg total) by mouth 4 (four) times daily. 28 capsule 0   citalopram  (CELEXA ) 20 MG tablet TAKE 1 TABLET BY MOUTH EVERY DAY 90 tablet 0   Spacer/Aero-Holding Chambers (AEROCHAMBER PLUS WITH MASK) inhaler Use as instructed 1 each 2   OVER THE COUNTER MEDICATION Take 1 capsule by mouth daily. Juice Plus fruit blend (Patient not taking: Reported on 10/17/2024)     OVER THE COUNTER MEDICATION Take 1 capsule by mouth daily in the afternoon. Juice Plus vegetable blend (Patient not taking: Reported on 10/17/2024)     No facility-administered medications prior to visit.    ROS Reviewed all systems and reported negative except as above     Objective:   Vitals:   10/17/24 1129  BP: 136/82  Pulse: 83  Temp: 97.8 F (36.6 C)  TempSrc: Oral  SpO2: 97%  Weight: 266 lb (120.7 kg)  Height: 5' 11 (1.803 m)    Physical Exam Physical Exam GENERAL: Appropriate to age, no acute distress. HEAD EYES EARS NOSE THROAT: Moist mucous membranes, atraumatic, normocephalic. CHEST: Wheezing present. CARDIAC: Regular rate and rhythm, normal S1, normal S2, no murmurs, no rubs, no gallops. ABDOMEN: Soft, nontender. NEUROLOGICAL: Motor and sensation grossly intact, alert and oriented times X 3. EXTREMITIES: Warm, well perfused, no edema.     CBC    Component Value Date/Time   WBC 9.8 06/24/2024 1504   RBC 5.14 06/24/2024 1504   HGB 17.0 06/24/2024 1504   HGB 14.6 04/28/2017 1239   HCT 50.3 06/24/2024 1504   HCT 43.2 04/28/2017 1239   PLT 235 06/24/2024 1504   PLT 273 04/28/2017 1239   MCV 97.9 06/24/2024 1504   MCV 93.2 11/17/2017 1850   MCV 97 04/28/2017 1239   MCH 33.1 06/24/2024 1504   MCHC 33.8 06/24/2024 1504   RDW 12.7 06/24/2024 1504   RDW 13.4 04/28/2017 1239   LYMPHSABS 1.5 06/24/2024 1504   MONOABS 0.9 06/24/2024 1504   EOSABS 0.1 06/24/2024 1504   BASOSABS 0.1 06/24/2024 1504     Chest imaging:  PFT:    Latest Ref  Rng & Units 10/16/2024    3:27 PM  PFT Results  FVC-Pre L 2.85   FVC-Predicted Pre % 69   FVC-Post L 2.69   FVC-Predicted Post % 66   Pre FEV1/FVC % % 56   Post FEV1/FCV % % 58   FEV1-Pre L 1.59   FEV1-Predicted Pre % 55   FEV1-Post L 1.57   DLCO uncorrected ml/min/mmHg 14.09   DLCO UNC% % 57   DLCO corrected ml/min/mmHg 14.09   DLCO COR %Predicted % 57   DLVA  Predicted % 82     Labs:    Echo:       Assessment & Plan:   Assessment and Plan Assessment & Plan COPD with acute exacerbation Acute exacerbation with increased wheezing and dyspnea. Suboptimal albuterol  use. Possible dust exposure from remodeling.  - Will give solumedrol and a duo nebs right now in the clinic.  - Prescribed prednisone : 2 tablets today, then 2 tablets daily for 5 days. - Prescribed azithromycin : 2 tablets today, then 1 tablet daily for 4 days. - Ordered nebulizer machine and solution from pharmacy. - Instructed Breztri  inhaler: 2 puffs twice daily. - Instructed albuterol  inhaler: every 4-6 hours as needed until improvement. - Advised to replace albuterol  inhaler with nebulizer once available. - Ordered chest x-ray. - Advised to avoid dust exposure. - Instructed to call office if no improvement in 2 days or symptoms worsen, and to go to hospital if necessary.     I spent 40 minutes caring for this patient today, including preparing to see the patient, obtaining a medical history , reviewing a separately obtained history, performing a medically appropriate examination and/or evaluation, counseling and educating the patient/family/caregiver, ordering medications, tests, or procedures, referring and communicating with other health care professionals (not separately reported), documenting clinical information in the electronic health record, independently interpreting results (not separately reported/billed) and communicating results to the patient/family/caregiver, and care coordination (not  separately reported/billed) I spent time reassessing patient after receiving IV steroids and nebulizer treatment for his COPD exacerbation  MDM   I reviewed the result(s) of his PFTs and the chest xray from today  I have ordered IV solumedrol in the clinic and duo neb. I ordered outpatient prednisone , azithromycin , CXR, nebulizer machine      Zola Herter, MD [facility] Office: [phone]

## 2024-10-17 NOTE — Progress Notes (Deleted)
 Subjective:   PATIENT ID: Brandon [name] GENDER: male DOB: Jul 11, 1941, MRN: [id number]   HPI Discussed the use of AI scribe software for clinical note transcription with the patient, who gave verbal consent to proceed.  History of Present Illness      Past Medical History:  Diagnosis Date   AAA (abdominal aortic aneurysm)    COPD (chronic obstructive pulmonary disease) (HCC)    DIC (disseminated intravascular coagulation) 09/09/2012   Hyperlipidemia    Hypertension    Leg wound, left 12/12/2013   Peripheral vascular disease    Respiratory failure, post-operative 09/09/2012   Shortness of breath      Family History  Problem Relation Age of Onset   Dementia Mother    Cancer Mother    Cancer Father    Heart disease Father    Heart attack Sister    Cancer Sister    Heart disease Brother      Social History   Socioeconomic History   Marital status: Married    Spouse name: Not on file   Number of children: 0   Years of education: Not on file   Highest education level: Not on file  Occupational History   Not on file  Tobacco Use   Smoking status: Former    Current packs/day: 0.00    Average packs/day: 1 pack/day for 60.0 years (60.0 ttl pk-yrs)    Types: Cigarettes    Start date: 09/08/1952    Quit date: 09/08/2012    Years since quitting: 12.1   Smokeless tobacco: Never  Vaping Use   Vaping status: Never Used  Substance and Sexual Activity   Alcohol use: No    Alcohol/week: 0.0 standard drinks of alcohol   Drug use: No   Sexual activity: Not Currently    Birth control/protection: None  Other Topics Concern   Not on file  Social History Narrative   Not on file   Social Drivers of Health   Financial Resource Strain: Not on file  Food Insecurity: No Food Insecurity (08/15/2022)   Hunger Vital Sign    Worried About Running Out of Food in the Last Year: Never true    Ran Out of Food in the Last Year: Never true  Transportation Needs: No  Transportation Needs (08/15/2022)   PRAPARE - Administrator, Civil Service (Medical): No    Lack of Transportation (Non-Medical): No  Physical Activity: Not on file  Stress: Not on file  Social Connections: Not on file  Intimate Partner Violence: Not At Risk (08/15/2022)   Humiliation, Afraid, Rape, and Kick questionnaire    Fear of Current or Ex-Partner: No    Emotionally Abused: No    Physically Abused: No    Sexually Abused: No     Allergies  Allergen Reactions   Umeclidinium-Vilanterol Other (See Comments)    Unknown reaction - patient not aware of being allergic to this     Outpatient Medications Prior to Visit  Medication Sig Dispense Refill   albuterol  (VENTOLIN  HFA) 108 (90 Base) MCG/ACT inhaler Inhale 2 puffs into the lungs every 4 (four) hours as needed for wheezing or shortness of breath. 6.7 g 1   amLODipine  (NORVASC ) 5 MG tablet Take 1 tablet (5 mg total) by mouth daily. 90 tablet 3   aspirin 81 MG chewable tablet Chew 81 mg by mouth daily.     atorvastatin  (LIPITOR) 20 MG tablet Take 1 tablet (20 mg total) by mouth daily. 90 tablet  3   budesonide-glycopyrrolate-formoterol (BREZTRI  AEROSPHERE) 160-9-4.8 MCG/ACT AERO inhaler Inhale 2 puffs into the lungs in the morning and at bedtime. 10.7 g 6   cephALEXin  (KEFLEX ) 500 MG capsule Take 1 capsule (500 mg total) by mouth 4 (four) times daily. 28 capsule 0   citalopram  (CELEXA ) 20 MG tablet TAKE 1 TABLET BY MOUTH EVERY DAY 90 tablet 0   Spacer/Aero-Holding Chambers (AEROCHAMBER PLUS WITH MASK) inhaler Use as instructed 1 each 2   OVER THE COUNTER MEDICATION Take 1 capsule by mouth daily. Juice Plus fruit blend (Patient not taking: Reported on 10/17/2024)     OVER THE COUNTER MEDICATION Take 1 capsule by mouth daily in the afternoon. Juice Plus vegetable blend (Patient not taking: Reported on 10/17/2024)     No facility-administered medications prior to visit.    ROS Reviewed all systems and reported negative  except as above     Objective:   Vitals:   10/17/24 1129  BP: 136/82  Pulse: 83  Temp: 97.8 F (36.6 C)  TempSrc: Oral  SpO2: 97%  Weight: 266 lb (120.7 kg)  Height: 5' 11 (1.803 m)    Physical Exam Physical Exam      CBC    Component Value Date/Time   WBC 9.8 06/24/2024 1504   RBC 5.14 06/24/2024 1504   HGB 17.0 06/24/2024 1504   HGB 14.6 04/28/2017 1239   HCT 50.3 06/24/2024 1504   HCT 43.2 04/28/2017 1239   PLT 235 06/24/2024 1504   PLT 273 04/28/2017 1239   MCV 97.9 06/24/2024 1504   MCV 93.2 11/17/2017 1850   MCV 97 04/28/2017 1239   MCH 33.1 06/24/2024 1504   MCHC 33.8 06/24/2024 1504   RDW 12.7 06/24/2024 1504   RDW 13.4 04/28/2017 1239   LYMPHSABS 1.5 06/24/2024 1504   MONOABS 0.9 06/24/2024 1504   EOSABS 0.1 06/24/2024 1504   BASOSABS 0.1 06/24/2024 1504     Chest imaging:  PFT:    Latest Ref Rng & Units 10/16/2024    3:27 PM  PFT Results  FVC-Pre L 2.85   FVC-Predicted Pre % 69   FVC-Post L 2.69   FVC-Predicted Post % 66   Pre FEV1/FVC % % 56   Post FEV1/FCV % % 58   FEV1-Pre L 1.59   FEV1-Predicted Pre % 55   FEV1-Post L 1.57   DLCO uncorrected ml/min/mmHg 14.09   DLCO UNC% % 57   DLCO corrected ml/min/mmHg 14.09   DLCO COR %Predicted % 57   DLVA Predicted % 82     Labs:    Echo:       Assessment & Plan:   Assessment and Plan Assessment & Plan         Zola Herter, MD [facility] Office: [phone]

## 2024-10-17 NOTE — Patient Instructions (Addendum)
  VISIT SUMMARY: You came in today because you have been experiencing worsening wheezing and difficulty breathing since yesterday, with a notable increase in severity today. You are currently using two inhalers: Breztri  and albuterol . You mentioned that you have not received a nebulizer machine that was previously ordered. You also noted that there is remodeling work being done in [place], which involves dust exposure.  YOUR PLAN: -COPD WITH ACUTE EXACERBATION: Chronic Obstructive Pulmonary Disease (COPD) is a chronic inflammatory lung disease that causes obstructed airflow from the lungs. An acute exacerbation means that your symptoms have suddenly worsened. You have been prescribed prednisone  (2 tablets everyday starting tomorrow) and azithromycin  (2 tablets today, then 1 tablet daily for 4 days) to help manage this exacerbation. You should continue using your Breztri  inhaler (2 puffs twice daily) and albuterol  inhaler (every 4-6 hours as needed until improvement). A nebulizer machine and solution have been ordered for you, and you should replace the albuterol  inhaler with the nebulizer once it is available. A chest x-ray has also been ordered. Please avoid dust exposure and call the office if there is no improvement in 2 days or if your symptoms worsen. Go to the hospital if necessary.  INSTRUCTIONS: A chest x-ray has been ordered. Please avoid dust exposure and call the office if there is no improvement in 2 days or if your symptoms worsen. Go to the hospital if necessary.        Contains text generated by [employer].

## 2024-10-30 ENCOUNTER — Telehealth: Payer: Self-pay

## 2024-10-30 NOTE — Telephone Encounter (Signed)
 Copied from CRM [id number]. Topic: Clinical - Order For Equipment >> Oct 29, 2024 12:16 PM [name] wrote: Reason for CRM: Pt wife [name] is calling regarding nebulizer machine. [name] stated they have not heard from DME provider regarding the nebulizer machine. [name] stated she has her phone screened for Spam Calls and she is unsure if anyone has contacted her regarding nebulizer machine for the pt.

## 2024-10-30 NOTE — Telephone Encounter (Signed)
 Patient given [employer] phone number to inquire on neb machine if nothing turn up,will call back

## 2024-11-21 ENCOUNTER — Observation Stay (HOSPITAL_COMMUNITY)

## 2024-11-21 ENCOUNTER — Other Ambulatory Visit: Payer: Self-pay

## 2024-11-21 ENCOUNTER — Inpatient Hospital Stay (HOSPITAL_COMMUNITY)
Admission: EM | Admit: 2024-11-21 | Discharge: 2024-11-28 | DRG: 190 | Disposition: A | Discharge status: Home / Self Care | Attending: Family Medicine | Admitting: Family Medicine

## 2024-11-21 ENCOUNTER — Emergency Department (HOSPITAL_COMMUNITY)

## 2024-11-21 ENCOUNTER — Encounter (HOSPITAL_COMMUNITY): Payer: Self-pay | Admitting: *Deleted

## 2024-11-21 DIAGNOSIS — Z888 Allergy status to other drugs, medicaments and biological substances status: Secondary | ICD-10-CM

## 2024-11-21 DIAGNOSIS — J449 Chronic obstructive pulmonary disease, unspecified: Secondary | ICD-10-CM | POA: Diagnosis present

## 2024-11-21 DIAGNOSIS — J9601 Acute respiratory failure with hypoxia: Secondary | ICD-10-CM | POA: Diagnosis present

## 2024-11-21 DIAGNOSIS — I739 Peripheral vascular disease, unspecified: Secondary | ICD-10-CM | POA: Diagnosis present

## 2024-11-21 DIAGNOSIS — I70219 Atherosclerosis of native arteries of extremities with intermittent claudication, unspecified extremity: Secondary | ICD-10-CM | POA: Diagnosis present

## 2024-11-21 DIAGNOSIS — Z820 Family history of epilepsy and other diseases of the nervous system: Secondary | ICD-10-CM

## 2024-11-21 DIAGNOSIS — I2510 Atherosclerotic heart disease of native coronary artery without angina pectoris: Secondary | ICD-10-CM | POA: Diagnosis present

## 2024-11-21 DIAGNOSIS — E669 Obesity, unspecified: Secondary | ICD-10-CM | POA: Diagnosis present

## 2024-11-21 DIAGNOSIS — Z9981 Dependence on supplemental oxygen: Secondary | ICD-10-CM

## 2024-11-21 DIAGNOSIS — Z8249 Family history of ischemic heart disease and other diseases of the circulatory system: Secondary | ICD-10-CM

## 2024-11-21 DIAGNOSIS — J069 Acute upper respiratory infection, unspecified: Secondary | ICD-10-CM | POA: Diagnosis present

## 2024-11-21 DIAGNOSIS — Z79899 Other long term (current) drug therapy: Secondary | ICD-10-CM

## 2024-11-21 DIAGNOSIS — R058 Other specified cough: Secondary | ICD-10-CM | POA: Diagnosis present

## 2024-11-21 DIAGNOSIS — Z8679 Personal history of other diseases of the circulatory system: Secondary | ICD-10-CM

## 2024-11-21 DIAGNOSIS — Z7982 Long term (current) use of aspirin: Secondary | ICD-10-CM

## 2024-11-21 DIAGNOSIS — Z7951 Long term (current) use of inhaled steroids: Secondary | ICD-10-CM

## 2024-11-21 DIAGNOSIS — R0609 Other forms of dyspnea: Secondary | ICD-10-CM | POA: Diagnosis present

## 2024-11-21 DIAGNOSIS — J441 Chronic obstructive pulmonary disease with (acute) exacerbation: Principal | ICD-10-CM | POA: Diagnosis present

## 2024-11-21 DIAGNOSIS — Z809 Family history of malignant neoplasm, unspecified: Secondary | ICD-10-CM

## 2024-11-21 DIAGNOSIS — Z1152 Encounter for screening for COVID-19: Secondary | ICD-10-CM

## 2024-11-21 DIAGNOSIS — S065XAA Traumatic subdural hemorrhage with loss of consciousness status unknown, initial encounter: Secondary | ICD-10-CM | POA: Diagnosis present

## 2024-11-21 DIAGNOSIS — I4510 Unspecified right bundle-branch block: Secondary | ICD-10-CM | POA: Diagnosis present

## 2024-11-21 DIAGNOSIS — Z6841 Body Mass Index (BMI) 40.0 and over, adult: Secondary | ICD-10-CM

## 2024-11-21 DIAGNOSIS — Z7952 Long term (current) use of systemic steroids: Secondary | ICD-10-CM

## 2024-11-21 DIAGNOSIS — I713 Abdominal aortic aneurysm, ruptured, unspecified: Secondary | ICD-10-CM | POA: Diagnosis present

## 2024-11-21 DIAGNOSIS — Z87891 Personal history of nicotine dependence: Secondary | ICD-10-CM

## 2024-11-21 DIAGNOSIS — I10 Essential (primary) hypertension: Secondary | ICD-10-CM | POA: Diagnosis present

## 2024-11-21 DIAGNOSIS — S0636AA Traumatic hemorrhage of cerebrum, unspecified, with loss of consciousness status unknown, initial encounter: Secondary | ICD-10-CM | POA: Diagnosis present

## 2024-11-21 DIAGNOSIS — E785 Hyperlipidemia, unspecified: Secondary | ICD-10-CM | POA: Diagnosis present

## 2024-11-21 LAB — RESPIRATORY PANEL BY PCR

## 2024-11-21 LAB — PRO BRAIN NATRIURETIC PEPTIDE: Pro Brain Natriuretic Peptide: 66 pg/mL

## 2024-11-21 LAB — CBC WITH DIFFERENTIAL/PLATELET
Abs Immature Granulocytes: 0.06 K/uL (ref 0.00–0.07)
Basophils Absolute: 0.1 K/uL (ref 0.0–0.1)
Basophils Relative: 1 %
Eosinophils Absolute: 0.9 K/uL — ABNORMAL HIGH (ref 0.0–0.5)
Eosinophils Relative: 9 %
HCT: 49.3 % (ref 39.0–52.0)
Hemoglobin: 15.9 g/dL (ref 13.0–17.0)
Immature Granulocytes: 1 %
Lymphocytes Relative: 18 %
Lymphs Abs: 1.8 K/uL (ref 0.7–4.0)
MCH: 33 pg (ref 26.0–34.0)
MCHC: 32.3 g/dL (ref 30.0–36.0)
MCV: 102.3 fL — ABNORMAL HIGH (ref 80.0–100.0)
Monocytes Absolute: 1.1 K/uL — ABNORMAL HIGH (ref 0.1–1.0)
Monocytes Relative: 11 %
Neutro Abs: 6.2 K/uL (ref 1.7–7.7)
Neutrophils Relative %: 60 %
Platelets: 277 K/uL (ref 150–400)
RBC: 4.82 MIL/uL (ref 4.22–5.81)
RDW: 13 % (ref 11.5–15.5)
WBC: 10.1 K/uL (ref 4.0–10.5)
nRBC: 0 % (ref 0.0–0.2)

## 2024-11-21 LAB — COMPREHENSIVE METABOLIC PANEL WITH GFR
ALT: 15 U/L (ref 0–44)
AST: 20 U/L (ref 15–41)
Albumin: 3.9 g/dL (ref 3.5–5.0)
Alkaline Phosphatase: 92 U/L (ref 38–126)
Anion gap: 10 (ref 5–15)
BUN: 13 mg/dL (ref 8–23)
CO2: 28 mmol/L (ref 22–32)
Calcium: 9.2 mg/dL (ref 8.9–10.3)
Chloride: 102 mmol/L (ref 98–111)
Creatinine, Ser: 0.92 mg/dL (ref 0.61–1.24)
GFR, Estimated: >60 mL/min
Glucose, Bld: 107 mg/dL — ABNORMAL HIGH (ref 70–99)
Potassium: 4.3 mmol/L (ref 3.5–5.1)
Sodium: 139 mmol/L (ref 135–145)
Total Bilirubin: 1.1 mg/dL (ref 0.0–1.2)
Total Protein: 6.8 g/dL (ref 6.5–8.1)

## 2024-11-21 LAB — I-STAT VENOUS BLOOD GAS, ED
Acid-Base Excess: 1 mmol/L (ref 0.0–2.0)
Bicarbonate: 29.3 mmol/L — ABNORMAL HIGH (ref 20.0–28.0)
Calcium, Ion: 1.17 mmol/L (ref 1.15–1.40)
HCT: 47 % (ref 39.0–52.0)
Hemoglobin: 16 g/dL (ref 13.0–17.0)
O2 Saturation: 85 %
Potassium: 4.2 mmol/L (ref 3.5–5.1)
Sodium: 140 mmol/L (ref 135–145)
TCO2: 31 mmol/L (ref 22–32)
pCO2, Ven: 62.4 mmHg — ABNORMAL HIGH (ref 44–60)
pH, Ven: 7.279 (ref 7.25–7.43)
pO2, Ven: 57 mmHg — ABNORMAL HIGH (ref 32–45)

## 2024-11-21 LAB — MAGNESIUM: Magnesium: 2 mg/dL (ref 1.7–2.4)

## 2024-11-21 LAB — I-STAT CHEM 8, ED
BUN: 13 mg/dL (ref 8–23)
Calcium, Ion: 1.16 mmol/L (ref 1.15–1.40)
Chloride: 101 mmol/L (ref 98–111)
Creatinine, Ser: 0.9 mg/dL (ref 0.61–1.24)
Glucose, Bld: 106 mg/dL — ABNORMAL HIGH (ref 70–99)
HCT: 47 % (ref 39.0–52.0)
Hemoglobin: 16 g/dL (ref 13.0–17.0)
Potassium: 4.2 mmol/L (ref 3.5–5.1)
Sodium: 140 mmol/L (ref 135–145)
TCO2: 30 mmol/L (ref 22–32)

## 2024-11-21 LAB — I-STAT CG4 LACTIC ACID, ED
Lactic Acid, Venous: 1.7 mmol/L (ref 0.5–1.9)
Lactic Acid, Venous: 1.4 mmol/L (ref 0.5–1.9)

## 2024-11-21 LAB — TROPONIN T, HIGH SENSITIVITY
Troponin T High Sensitivity: <15 ng/L (ref 0–19)
Troponin T High Sensitivity: <15 ng/L (ref 0–19)

## 2024-11-21 LAB — RESP PANEL BY RT-PCR (RSV, FLU A&B, COVID)  RVPGX2
Influenza A by PCR: NEGATIVE
Influenza B by PCR: NEGATIVE
Resp Syncytial Virus by PCR: NEGATIVE
SARS Coronavirus 2 by RT PCR: NEGATIVE

## 2024-11-21 LAB — PROTIME-INR
INR: 1.2 (ref 0.8–1.2)
Prothrombin Time: 15.9 s — ABNORMAL HIGH (ref 11.4–15.2)

## 2024-11-21 LAB — PHOSPHORUS: Phosphorus: 2.6 mg/dL (ref 2.5–4.6)

## 2024-11-21 MED ORDER — HYDROCOD POLI-CHLORPHE POLI ER 10-8 MG/5ML PO SUER
5.0000 mL | Freq: Two times a day (BID) | ORAL | Status: DC
  Administered 2024-11-21 – 2024-11-28 (×15): 5 mL via ORAL
  Filled 2024-11-21 (×15): qty 5

## 2024-11-21 MED ORDER — LEVALBUTEROL HCL 1.25 MG/0.5ML IN NEBU
1.2500 mg | INHALATION_SOLUTION | Freq: Four times a day (QID) | RESPIRATORY_TRACT | Status: DC
  Administered 2024-11-21 – 2024-11-25 (×13): 1.25 mg via RESPIRATORY_TRACT
  Filled 2024-11-21 (×19): qty 0.5

## 2024-11-21 MED ORDER — CITALOPRAM HYDROBROMIDE 20 MG PO TABS
20.0000 mg | ORAL_TABLET | Freq: Every day | ORAL | Status: DC
  Administered 2024-11-22 – 2024-11-28 (×7): 20 mg via ORAL
  Filled 2024-11-21: qty 2
  Filled 2024-11-21: qty 1
  Filled 2024-11-21: qty 2
  Filled 2024-11-21 (×5): qty 1

## 2024-11-21 MED ORDER — TRAZODONE HCL 50 MG PO TABS
25.0000 mg | ORAL_TABLET | Freq: Every evening | ORAL | Status: DC | PRN
  Administered 2024-11-25: 25 mg via ORAL
  Filled 2024-11-21: qty 1

## 2024-11-21 MED ORDER — SENNOSIDES-DOCUSATE SODIUM 8.6-50 MG PO TABS: 1.0000 | ORAL_TABLET | Freq: Every evening | ORAL | Status: DC | PRN

## 2024-11-21 MED ORDER — FLEET ENEMA RE ENEM: 1.0000 | ENEMA | Freq: Once | RECTAL | Status: DC | PRN

## 2024-11-21 MED ORDER — ATORVASTATIN CALCIUM 40 MG PO TABS
20.0000 mg | ORAL_TABLET | Freq: Every day | ORAL | Status: DC
  Administered 2024-11-21 – 2024-11-28 (×8): 20 mg via ORAL
  Filled 2024-11-21 (×8): qty 1

## 2024-11-21 MED ORDER — ALBUTEROL SULFATE (2.5 MG/3ML) 0.083% IN NEBU
10.0000 mg/h | INHALATION_SOLUTION | RESPIRATORY_TRACT | Status: DC
  Administered 2024-11-21: 10 mg/h via RESPIRATORY_TRACT
  Filled 2024-11-21: qty 15

## 2024-11-21 MED ORDER — SODIUM CHLORIDE 0.9% FLUSH
3.0000 mL | Freq: Two times a day (BID) | INTRAVENOUS | Status: DC
  Administered 2024-11-21 – 2024-11-26 (×10): 3 mL via INTRAVENOUS

## 2024-11-21 MED ORDER — SODIUM CHLORIDE 0.9% FLUSH
3.0000 mL | Freq: Two times a day (BID) | INTRAVENOUS | Status: DC
  Administered 2024-11-21 – 2024-11-28 (×14): 3 mL via INTRAVENOUS

## 2024-11-21 MED ORDER — GUAIFENESIN 100 MG/5ML PO LIQD
5.0000 mL | Freq: Once | ORAL | Status: AC
  Administered 2024-11-21: 5 mL via ORAL
  Filled 2024-11-21: qty 10

## 2024-11-21 MED ORDER — HYDRALAZINE HCL 20 MG/ML IJ SOLN: 10.0000 mg | INTRAMUSCULAR | Status: DC | PRN

## 2024-11-21 MED ORDER — HYDROMORPHONE HCL 1 MG/ML IJ SOLN: 0.5000 mg | INTRAMUSCULAR | Status: DC | PRN

## 2024-11-21 MED ORDER — ACETAMINOPHEN 325 MG PO TABS: 650.0000 mg | ORAL_TABLET | Freq: Four times a day (QID) | ORAL | Status: DC | PRN

## 2024-11-21 MED ORDER — IOHEXOL 350 MG/ML SOLN
75.0000 mL | Freq: Once | INTRAVENOUS | Status: AC | PRN
  Administered 2024-11-21: 75 mL via INTRAVENOUS

## 2024-11-21 MED ORDER — SENNOSIDES-DOCUSATE SODIUM 8.6-50 MG PO TABS
1.0000 | ORAL_TABLET | Freq: Every day | ORAL | Status: DC
  Administered 2024-11-22 – 2024-11-27 (×3): 1 via ORAL
  Filled 2024-11-21 (×4): qty 1

## 2024-11-21 MED ORDER — ZINC SULFATE 220 (50 ZN) MG PO CAPS
220.0000 mg | ORAL_CAPSULE | Freq: Every day | ORAL | Status: DC
  Administered 2024-11-21 – 2024-11-28 (×8): 220 mg via ORAL
  Filled 2024-11-21 (×8): qty 1

## 2024-11-21 MED ORDER — METHYLPREDNISOLONE SODIUM SUCC 40 MG IJ SOLR
40.0000 mg | Freq: Two times a day (BID) | INTRAMUSCULAR | Status: DC
  Administered 2024-11-21 – 2024-11-25 (×9): 40 mg via INTRAVENOUS
  Filled 2024-11-21 (×9): qty 1

## 2024-11-21 MED ORDER — OXYCODONE HCL 5 MG PO TABS: 5.0000 mg | ORAL_TABLET | ORAL | Status: DC | PRN

## 2024-11-21 MED ORDER — VITAMIN C 500 MG PO TABS
500.0000 mg | ORAL_TABLET | Freq: Every day | ORAL | Status: DC
  Administered 2024-11-21 – 2024-11-28 (×8): 500 mg via ORAL
  Filled 2024-11-21 (×8): qty 1

## 2024-11-21 MED ORDER — AMLODIPINE BESYLATE 5 MG PO TABS: 5.0000 mg | ORAL_TABLET | Freq: Every day | ORAL | Status: DC

## 2024-11-21 MED ORDER — ALBUTEROL SULFATE (2.5 MG/3ML) 0.083% IN NEBU
5.0000 mg | INHALATION_SOLUTION | Freq: Once | RESPIRATORY_TRACT | Status: AC
  Administered 2024-11-21: 5 mg via RESPIRATORY_TRACT
  Filled 2024-11-21: qty 6

## 2024-11-21 MED ORDER — BISACODYL 5 MG PO TBEC: 5.0000 mg | DELAYED_RELEASE_TABLET | Freq: Every day | ORAL | Status: DC | PRN

## 2024-11-21 MED ORDER — GUAIFENESIN 100 MG/5ML PO LIQD
10.0000 mL | Freq: Three times a day (TID) | ORAL | Status: DC
  Administered 2024-11-21 – 2024-11-25 (×13): 10 mL via ORAL
  Filled 2024-11-21 (×7): qty 15
  Filled 2024-11-21: qty 10
  Filled 2024-11-21 (×4): qty 15
  Filled 2024-11-21: qty 10

## 2024-11-21 MED ORDER — IPRATROPIUM BROMIDE 0.02 % IN SOLN
0.5000 mg | Freq: Four times a day (QID) | RESPIRATORY_TRACT | Status: DC | PRN
  Administered 2024-11-26: 0.5 mg via RESPIRATORY_TRACT
  Filled 2024-11-21: qty 2.5

## 2024-11-21 MED ORDER — LACTATED RINGERS IV BOLUS: 500.0000 mL | Freq: Once | INTRAVENOUS | Status: DC

## 2024-11-21 MED ORDER — ONDANSETRON HCL 4 MG/2ML IJ SOLN: 4.0000 mg | Freq: Four times a day (QID) | INTRAMUSCULAR | Status: DC | PRN

## 2024-11-21 MED ORDER — ASPIRIN 81 MG PO CHEW
81.0000 mg | CHEWABLE_TABLET | Freq: Every day | ORAL | Status: DC
  Administered 2024-11-21 – 2024-11-28 (×8): 81 mg via ORAL
  Filled 2024-11-21 (×8): qty 1

## 2024-11-21 MED ORDER — SODIUM CHLORIDE 0.9 % IV SOLN: INTRAVENOUS | Status: DC

## 2024-11-21 MED ORDER — ACETAMINOPHEN 650 MG RE SUPP: 650.0000 mg | Freq: Four times a day (QID) | RECTAL | Status: DC | PRN

## 2024-11-21 MED ORDER — HEPARIN SODIUM (PORCINE) 5000 UNIT/ML IJ SOLN: 5000.0000 [IU] | Freq: Three times a day (TID) | INTRAMUSCULAR | Status: DC

## 2024-11-21 MED ORDER — ONDANSETRON HCL 4 MG PO TABS: 4.0000 mg | ORAL_TABLET | Freq: Four times a day (QID) | ORAL | Status: DC | PRN

## 2024-11-21 NOTE — ED Provider Notes (Signed)
 " [place] [facility] Provider Note   CSN: [id number] Arrival date & time: 11/21/24  0240     Patient presents with: Shortness of Breath   Brandon [name] is a 84 y.o. male.   The history is provided by the patient, the EMS personnel and medical records.  Shortness of Breath Brandon [name] is a 84 y.o. male who presents to the Emergency Department complaining of shortness of breath.  He presents to the emergency department by EMS for evaluation of sudden onset shortness of breath that woke him from sleep.  He does report 2 days of progressive shortness of breath with cough productive of clear sputum.  He does not have any fever, chest pain, abdominal pain, leg swelling or pain.  EMS report respiratory distress on their arrival.  He was treated with 2 DuoNebs, 125 mg Solu-Medrol .  He has a history of ruptured AAA s/p repair, COPD.  He does not use supplemental oxygen at home.     Prior to Admission medications  Medication Sig Start Date End Date Taking? Authorizing Provider  albuterol  (PROVENTIL ) (2.5 MG/3ML) 0.083% nebulizer solution Take 3 mLs (2.5 mg total) by nebulization every 6 (six) hours as needed for wheezing or shortness of breath. 10/17/24   Hattar, Zola SAILOR, MD  albuterol  (VENTOLIN  HFA) 108 (90 Base) MCG/ACT inhaler Inhale 2 puffs into the lungs every 4 (four) hours as needed for wheezing or shortness of breath. 12/08/20   Armenta Canning, MD  amLODipine  (NORVASC ) 5 MG tablet Take 1 tablet (5 mg total) by mouth daily. 02/03/23 10/17/24  Ladona Heinz, MD  aspirin  81 MG chewable tablet Chew 81 mg by mouth daily.    [provider]  atorvastatin  (LIPITOR) 20 MG tablet Take 1 tablet (20 mg total) by mouth daily. 08/23/23   Ladona Heinz, MD  azithromycin  (ZITHROMAX  Z-PAK) 250 MG tablet TAKE 2 TABLETS THE FIRST DAY. Then take 1 tablet for the next 4 days. 10/17/24   Hattar, Zola SAILOR, MD  budesonide-glycopyrrolate-formoterol (BREZTRI  AEROSPHERE)  160-9-4.8 MCG/ACT AERO inhaler Inhale 2 puffs into the lungs in the morning and at bedtime. 09/04/24   Hattar, Zola SAILOR, MD  cephALEXin  (KEFLEX ) 500 MG capsule Take 1 capsule (500 mg total) by mouth 4 (four) times daily. 06/24/24   Charlyn Sora, MD  citalopram  (CELEXA ) 20 MG tablet TAKE 1 TABLET BY MOUTH EVERY DAY 05/26/18   Levora Reyes SAUNDERS, MD  OVER THE COUNTER MEDICATION Take 1 capsule by mouth daily. Juice Plus fruit blend Patient not taking: Reported on 10/17/2024    [provider]  OVER THE COUNTER MEDICATION Take 1 capsule by mouth daily in the afternoon. Juice Plus vegetable blend Patient not taking: Reported on 10/17/2024    [provider]  predniSONE  (DELTASONE ) 20 MG tablet Take 2 tablets (40 mg total) by mouth daily with breakfast. 10/17/24   Hattar, Zola SAILOR, MD  Spacer/Aero-Holding Chambers (AEROCHAMBER PLUS WITH MASK) inhaler Use as instructed 12/08/20   Armenta Canning, MD    Allergies: Umeclidinium-vilanterol    Review of Systems  Respiratory:  Positive for shortness of breath.   All other systems reviewed and are negative.   Updated Vital Signs BP 110/65   Pulse 98   Temp 97.8 F (36.6 C) (Oral)   Resp (!) 28   SpO2 97%   Physical Exam Vitals and nursing note reviewed.  Constitutional:      General: He is in acute distress.     Appearance:  He is well-developed.  HENT:     Head: Normocephalic and atraumatic.  Cardiovascular:     Rate and Rhythm: Regular rhythm. Tachycardia present.     Heart sounds: No murmur heard. Pulmonary:     Effort: Respiratory distress present.     Comments: Tachypnea, speaks in short phrases, diffuse rhonchi. Abdominal:     Palpations: Abdomen is soft.     Tenderness: There is no abdominal tenderness. There is no guarding or rebound.  Musculoskeletal:        General: No tenderness.     Comments: 2+ DP pulses with trace edema to bilateral lower extremities  Skin:    General: Skin is warm and dry.  Neurological:      Mental Status: He is alert and oriented to person, place, and time.  Psychiatric:        Behavior: Behavior normal.     (all labs ordered are listed, but only abnormal results are displayed) Labs Reviewed  COMPREHENSIVE METABOLIC PANEL WITH GFR - Abnormal; Notable for the following components:      Result Value   Glucose, Bld 107 (*)    All other components within normal limits  CBC WITH DIFFERENTIAL/PLATELET - Abnormal; Notable for the following components:   MCV 102.3 (*)    Monocytes Absolute 1.1 (*)    Eosinophils Absolute 0.9 (*)    All other components within normal limits  I-STAT VENOUS BLOOD GAS, ED - Abnormal; Notable for the following components:   pCO2, Ven 62.4 (*)    pO2, Ven 57 (*)    Bicarbonate 29.3 (*)    All other components within normal limits  I-STAT CHEM 8, ED - Abnormal; Notable for the following components:   Glucose, Bld 106 (*)    All other components within normal limits  RESP PANEL BY RT-PCR (RSV, FLU A&B, COVID)  RVPGX2  PRO BRAIN NATRIURETIC PEPTIDE  I-STAT CG4 LACTIC ACID, ED  I-STAT CG4 LACTIC ACID, ED  TROPONIN T, HIGH SENSITIVITY  TROPONIN T, HIGH SENSITIVITY    EKG: EKG Interpretation Date/Time:  Wednesday November 21 2024 02:52:55 EST Ventricular Rate:  98 PR Interval:  147 QRS Duration:  125 QT Interval:  354 QTC Calculation: 452 R Axis:   76  Text Interpretation: Sinus rhythm Atrial premature complex Right bundle branch block ST elevation, consider inferior injury Baseline wander Confirmed by Griselda Norris [phone]) on 11/21/2024 2:56:16 AM  Radiology: [facility] Chest Port 1 View Result Date: 11/21/2024 EXAM: 1 VIEW(S) XRAY OF THE CHEST 11/21/2024 03:25:00 AM COMPARISON: PA and lateral chest 10/17/2024. CLINICAL HISTORY: sob sob FINDINGS: LINES, TUBES AND DEVICES: Telemetry leads overlying the chest. LUNGS AND PLEURA: The lungs are mildly emphysematous. No focal pulmonary opacity. No pleural effusion. No pneumothorax. No vascular congestion  is seen. HEART AND MEDIASTINUM: The heart is slightly enlarged. There is mild aortic tortuosity and atherosclerosis with a stable mediastinum. BONES AND SOFT TISSUES: Osteopenia and thoracic spondylosis. IMPRESSION: 1. No acute findings. Stable COPD chest . 2. Slightly enlarged heart without vascular congestion. Electronically signed by: Francis Quam MD 11/21/2024 03:44 AM EST RP Workstation: [id number]     Procedures   Medications Ordered in the ED  albuterol  (PROVENTIL ) (2.5 MG/3ML) 0.083% nebulizer solution (0 mg/hr Nebulization Stopped 11/21/24 0523)  guaiFENesin  (ROBITUSSIN) 100 MG/5ML liquid 5 mL (5 mLs Oral Given 11/21/24 0610)  albuterol  (PROVENTIL ) (2.5 MG/3ML) 0.083% nebulizer solution 5 mg (5 mg Nebulization Given 11/21/24 0610)  Medical Decision Making Amount and/or Complexity of Data Reviewed Labs: ordered. Radiology: ordered.  Risk OTC drugs. Prescription drug management.   Patient with history of COPD, hypertension, AAA status postrepair here for evaluation of shortness of breath for the last 2 days, acutely worsened overnight.  Patient with diffuse wheezing, rhonchi on examination with accessory muscle use and tachypnea at time of ED arrival.  He was transition to BiPAP for respiratory support and treated with albuterol  through the circuit.  Chest x-ray is negative for acute abnormality, personally reviewed and interpreted, agree with radiologist interpretation.  Troponins, BNP are negative, current picture is not consistent with ACS, acute CHF.  CBC without acute leukocytosis.  CMP with normal renal function.  He was reevaluated after continuous neb on BiPAP with significant improvement in his work of breathing.  He was transitioned off BiPAP.  He did require additional neb.  He does have increased oxygen requirement to 2 L to maintain sats in the low 90s.  Feel patient would benefit from admission for COPD exacerbation.  Discussed with patient  findings of studies and he is in agreement with admission for ongoing care.  Current picture is not consistent with PE, pneumonia.     Final diagnoses:  None    ED Discharge Orders     None          Griselda Norris, MD 11/21/24 [phone]  "

## 2024-11-21 NOTE — Assessment & Plan Note (Signed)
Continue aspirin and statins ?

## 2024-11-21 NOTE — Assessment & Plan Note (Signed)
 Mild signs of exacerbation requiring 3 L of oxygen, diffuse mild wheezing -Initiating IV steroids -Scheduled and as needed DuoNeb bronchodilator -encouraging incentive spirometer, flutter valve - Not O2 dependent at baseline

## 2024-11-21 NOTE — Assessment & Plan Note (Signed)
 Remote history noted

## 2024-11-21 NOTE — Assessment & Plan Note (Signed)
 Currently stable denies any chest pain, continue statins, aspirin , amlodipine 

## 2024-11-21 NOTE — Plan of Care (Signed)

## 2024-11-21 NOTE — ED Notes (Signed)
 CCMD called by this RN

## 2024-11-21 NOTE — Assessment & Plan Note (Signed)
 Upper respiratory infection, likely viral, currently afebrile, normotensive, no leukocytosis -Viral panel negative -Continue supportive therapy -As needed mucolytics, cough suppressant, - Nebs and steroids

## 2024-11-21 NOTE — Care Management Obs Status (Signed)
 MEDICARE OBSERVATION STATUS NOTIFICATION   Patient Details  Name: Brandon [name] MRN: [id number] Date of Birth: May 23, 1941   Medicare Observation Status Notification Given:  Yes  Verbally reviewed observation notice with [facility] telephonically at [phone].  Mail a copy to the patients home address.  Dawson Albers 11/21/2024, 4:12 PM

## 2024-11-21 NOTE — Assessment & Plan Note (Signed)
 Acute on Chronic, progressively has gotten worse-with acute upper respiratory symptoms Monitor closely -Last echo reviewed from 08/04/2022, preserved EF no signs of systolic or diastolic failure -BMP, BNP within normal limits

## 2024-11-21 NOTE — H&P (Addendum)
 " History and Physical   Patient: Brandon [name]                            PCP: Seabron Lenis, MD                    DOB: 1941/01/10            DOA: 11/21/2024 FMW:[id number]             DOS: 11/21/2024, 10:32 AM  Seabron Lenis, MD  Patient coming from:   HOME  I have personally reviewed patient's medical records, in electronic medical records, including:  [place] [facility], and care everywhere.    Chief Complaint:   Chief Complaint  Patient presents with   Shortness of Breath    History of present illness:    Brandon [name] is a 84 year-old male COPD, not O2 dependent, history of AAA with repair, HTN, depression ... Presents to ED with chief complaint of shortness of breath.  Reported shortness of breath with sudden, woke him up from sleep.  Reports has been going on subtly for past 2 days associate with productive cough clear in nature.  Denies any chest pain or shortness of breath.  Denies any abdominal pain.  Denies any fever or chills.  ED Evaluation: Blood pressure 113/84, pulse 87, temperature 97.9 F (36.6 C), resp. rate 17, SpO2 95% on 3 L of oxygen On arrival heart rate 110, RR 30, LABs: CBC CMP within normal limits, glucose 106, lactic acid 1.4 Respiratory panel: Influenza A/B RSV, COVID all negative   Patient Denies having: Fever, Chills, Chest Pain, Abd pain, N/V/D, headache, dizziness, lightheadedness,  Dysuria, Joint pain, rash, open wounds   Review of Systems: As per HPI, otherwise 10 point review of systems were negative.   ----------------------------------------------------------------------------------------------------------------------  Allergies[1]  Home MEDs:  Prior to Admission medications  Medication Sig Start Date End Date Taking? Authorizing Provider  albuterol  (PROVENTIL ) (2.5 MG/3ML) 0.083% nebulizer solution Take 3 mLs (2.5 mg total) by nebulization every 6 (six) hours as needed for wheezing or shortness of breath. 10/17/24   Hattar, Zola SAILOR, MD   albuterol  (VENTOLIN  HFA) 108 (90 Base) MCG/ACT inhaler Inhale 2 puffs into the lungs every 4 (four) hours as needed for wheezing or shortness of breath. 12/08/20   Armenta Canning, MD  amLODipine  (NORVASC ) 5 MG tablet Take 1 tablet (5 mg total) by mouth daily. 02/03/23 10/17/24  Ladona Heinz, MD  aspirin  81 MG chewable tablet Chew 81 mg by mouth daily.    [provider]  atorvastatin  (LIPITOR) 20 MG tablet Take 1 tablet (20 mg total) by mouth daily. 08/23/23   Ladona Heinz, MD  azithromycin  (ZITHROMAX  Z-PAK) 250 MG tablet TAKE 2 TABLETS THE FIRST DAY. Then take 1 tablet for the next 4 days. 10/17/24   Hattar, Zola SAILOR, MD  budesonide-glycopyrrolate-formoterol (BREZTRI  AEROSPHERE) 160-9-4.8 MCG/ACT AERO inhaler Inhale 2 puffs into the lungs in the morning and at bedtime. 09/04/24   Hattar, Zola SAILOR, MD  cephALEXin  (KEFLEX ) 500 MG capsule Take 1 capsule (500 mg total) by mouth 4 (four) times daily. 06/24/24   Charlyn Sora, MD  citalopram  (CELEXA ) 20 MG tablet TAKE 1 TABLET BY MOUTH EVERY DAY Patient taking differently: Take 20 mg by mouth daily. 05/26/18   Levora Reyes SAUNDERS, MD  OVER THE COUNTER MEDICATION Take 1 capsule by mouth daily. Juice Plus fruit blend Patient not taking: Reported on 10/17/2024  [provider]  OVER THE COUNTER MEDICATION Take 1 capsule by mouth daily in the afternoon. Juice Plus vegetable blend Patient not taking: Reported on 10/17/2024    [provider]  predniSONE  (DELTASONE ) 20 MG tablet Take 2 tablets (40 mg total) by mouth daily with breakfast. 10/17/24   Hattar, Zola SAILOR, MD  Spacer/Aero-Holding Chambers (AEROCHAMBER PLUS WITH MASK) inhaler Use as instructed 12/08/20   Armenta Canning, MD    PRN MEDs: acetaminophen  **OR** acetaminophen , hydrALAZINE , HYDROmorphone  (DILAUDID ) injection, ipratropium, ondansetron  **OR** ondansetron  (ZOFRAN ) IV, oxyCODONE , traZODone   Past Medical History:  Diagnosis Date   AAA (abdominal aortic aneurysm)    COPD  (chronic obstructive pulmonary disease) (HCC)    DIC (disseminated intravascular coagulation) 09/09/2012   Hyperlipidemia    Hypertension    Leg wound, left 12/12/2013   Peripheral vascular disease    Respiratory failure, post-operative 09/09/2012   Shortness of breath     Past Surgical History:  Procedure Laterality Date   ABDOMINAL AORTIC ANEURYSM REPAIR  09/08/2012   Procedure: ANEURYSM ABDOMINAL AORTIC REPAIR;  Surgeon: Lynwood JONETTA Collum, MD;  Location: [facility] OR;  Service: Vascular;  Laterality: N/A;  open repair ruptured abdomenal aortic aneurysm   CYSTOSCOPY W/ RETROGRADES Bilateral 01/24/2023   Procedure: FLEXIBLE CYSTOSCOPY;  Surgeon: Carolee Sherwood JONETTA DOUGLAS, MD;  Location: [facility] ORS;  Service: Urology;  Laterality: Bilateral;  60 MINS   GANGLION CYST EXCISION     Right wrist   MASS EXCISION N/A 01/24/2023   Procedure: EXCISION PENILE  MASS;  Surgeon: Carolee Sherwood JONETTA DOUGLAS, MD;  Location: [facility] ORS;  Service: Urology;  Laterality: N/A;     reports that he quit smoking about 12 years ago. His smoking use included cigarettes. He started smoking about 72 years ago. He has a 60 pack-year smoking history. He has never used smokeless tobacco. He reports that he does not drink alcohol and does not use drugs.   Family History  Problem Relation Age of Onset   Dementia Mother    Cancer Mother    Cancer Father    Heart disease Father    Heart attack Sister    Cancer Sister    Heart disease Brother     Physical Exam:   Vitals:   11/21/24 0858 11/21/24 0900 11/21/24 0912 11/21/24 1028  BP:  113/70  113/64  Pulse: 87 88  79  Resp: 17 (!) 25  (!) 23  Temp:      TempSrc:      SpO2: 95% 96% 95% 95%   Constitutional: NAD, calm, comfortable Eyes: PERRL, lids and conjunctivae normal ENMT: Mucous membranes are moist. Posterior pharynx clear of any exudate or lesions.Normal dentition.  Neck: normal, supple, no masses, no thyromegaly Respiratory: clear to auscultation bilaterally, no wheezing, no  crackles. Normal respiratory effort. No accessory muscle use.  Cardiovascular: Regular rate and rhythm, no murmurs / rubs / gallops. No extremity edema. 2+ pedal pulses. No carotid bruits.  Abdomen: no tenderness, no masses palpated. No hepatosplenomegaly. Bowel sounds positive.  Musculoskeletal: no clubbing / cyanosis. No joint deformity upper and lower extremities. Good ROM, no contractures. Normal muscle tone.  Neurologic: CN II-XII grossly intact. Sensation intact, DTR normal. Strength 5/5 in all 4.  Psychiatric: Normal judgment and insight. Alert and oriented x 3. Normal mood.  Skin: no rashes, lesions, ulcers. No induration     Labs on admission:    I have personally reviewed following labs and imaging studies  CBC: Recent Labs  Lab 11/21/24 0304  11/21/24 0324 11/21/24 0326  WBC 10.1  --   --   NEUTROABS 6.2  --   --   HGB 15.9 16.0 16.0  HCT 49.3 47.0 47.0  MCV 102.3*  --   --   PLT 277  --   --    Basic Metabolic Panel: Recent Labs  Lab 11/21/24 0304 11/21/24 0324 11/21/24 0326  NA 139 140 140  K 4.3 4.2 4.2  CL 102 101  --   CO2 28  --   --   GLUCOSE 107* 106*  --   BUN 13 13  --   CREATININE 0.92 0.90  --   CALCIUM  9.2  --   --    GFR: CrCl cannot be calculated (Unknown ideal weight.). Liver Function Tests: Recent Labs  Lab 11/21/24 0304  AST 20  ALT 15  ALKPHOS 92  BILITOT 1.1  PROT 6.8  ALBUMIN  3.9   BNP (last 3 results) Recent Labs    11/21/24 0304  PROBNP 66.0       Last A1C:  No results found for: HGBA1C   Radiologic Exams on Admission:   DG Chest Port 1 View Result Date: 11/21/2024 EXAM: 1 VIEW(S) XRAY OF THE CHEST 11/21/2024 03:25:00 AM COMPARISON: PA and lateral chest 10/17/2024. CLINICAL HISTORY: sob sob FINDINGS: LINES, TUBES AND DEVICES: Telemetry leads overlying the chest. LUNGS AND PLEURA: The lungs are mildly emphysematous. No focal pulmonary opacity. No pleural effusion. No pneumothorax. No vascular congestion is seen.  HEART AND MEDIASTINUM: The heart is slightly enlarged. There is mild aortic tortuosity and atherosclerosis with a stable mediastinum. BONES AND SOFT TISSUES: Osteopenia and thoracic spondylosis. IMPRESSION: 1. No acute findings. Stable COPD chest . 2. Slightly enlarged heart without vascular congestion. Electronically signed by: Francis Quam MD 11/21/2024 03:44 AM EST RP Workstation: [id number]    EKG:   Independently reviewed.  Orders placed or performed during the hospital encounter of 11/21/24   EKG 12-Lead   EKG 12-Lead   ED EKG   ED EKG   EKG 12-Lead   ---------------------------------------------------------------------------------------------------------------------------------------    Assessment / Plan:   Principal Problem:   Acute hypoxic respiratory failure (HCC) Active Problems:   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)   Upper airway cough syndrome   AAA (abdominal aortic aneurysm, ruptured) (HCC)   Peripheral vascular disease, unspecified   Atherosclerosis of native artery of extremity with intermittent claudication   Primary hypertension   Dyspnea on exertion   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)   Subdural hematoma (HCC)   Hyperlipidemia   Assessment and Plan: * Acute hypoxic respiratory failure (HCC) - Acute hypoxic respiratory failure, currently on 3 L of oxygen, satting 97% -Associated with cough, congestion, possibly upper respiratory infection viral -Current respiratory panel including influenza A, B, RSV, COVID all negative -Chest x-ray: No acute cardiopulmonary findings, stable COPD -Currently patient is afebrile, normotensive with no leukocytosis -will avoid antibiotics -Continue supportive therapy, DuoNeb bronchodilators, supplemental oxygen, vitamin C , zinc , Sinex, -Encouraging spirometer and flutter valve -Goal to wean off oxygen to room air  - CT angiogram  -negative for  acute PE No definite evidence of large central pulmonary embolus seen in the main pulmonary artery or main portions of the left and right pulmonary arteries. However, due to significant respiratory motion artifact, 2. Extensive coronary artery calcifications are noted suggesting coronary artery disease. 3. Minimal bibasilar subsegmental atelectasis is noted. 4. Minimal cholelithiasis.   Upper airway cough syndrome Upper respiratory  infection, likely viral, currently afebrile, normotensive, no leukocytosis -Viral panel negative -Continue supportive therapy -As needed mucolytics, cough suppressant, - Nebs and steroids  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) Mild signs of exacerbation requiring 3 L of oxygen, diffuse mild wheezing -Initiating IV steroids -Scheduled and as needed DuoNeb bronchodilator -encouraging incentive spirometer, flutter valve - Not O2 dependent at baseline  Hyperlipidemia Continue statin  Subdural hematoma (HCC) Remote history noted  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  Atherosclerosis of native artery of extremity with intermittent claudication Currently stable denies any chest pain, continue statins, aspirin , amlodipine   Peripheral vascular disease, unspecified Continue aspirin  and statins  AAA (abdominal aortic aneurysm, ruptured) (HCC) History of AAA - 2013 -Obtaining CT angiogram for further evaluation - Patient denies any chest pain Last echo 08/04/2022, EF 55-60%,  normal LV function, no LVH, no structural abnormalities reported  09/08/24 -vascular US  AAA duplex-last vascular study of abdominal aorta: Common iliac graft noted-around 2.5 centimeter               Consults called:  None -------------------------------------------------------------------------------------------------------------------------------------------- DVT prophylaxis:  Place and maintain sequential compression device Start: 11/21/24 0919 Place TED hose Start: 11/21/24 0919 SCDs Start: 11/21/24 0914   Code Status:   Code Status: Full Code   Admission status: Patient will be admitted as Observation, with a greater than 2 midnight length of stay. Level of care: Telemetry   Family Communication:  none at bedside  (The above findings and plan of care has been discussed with patient in detail, the patient expressed understanding and agreement of above plan)  --------------------------------------------------------------------------------------------------------------------------------------------------  Disposition Plan:  Anticipated 1-2 days Status is: Observation The patient remains OBS appropriate and will d/c before 2 midnights.     ----------------------------------------------------------------------------------------------------------------------------------------------------  Time spent:  55  Min.  Was spent seeing and evaluating the patient, reviewing all medical records, drawn plan of care.  SIGNED: Adriana DELENA Grams, MD, FHM. FAAFP. [place] [facility], Pager  (Please use [email or web address] to page/ or secure chat through epic) If 7PM-7AM, please contact night-coverage [email or web address],  11/21/2024, 10:32 AM     [1]  Allergies Allergen Reactions   Umeclidinium-Vilanterol Other (See Comments)    Unknown reaction - patient not aware of being allergic to this   "

## 2024-11-21 NOTE — Assessment & Plan Note (Addendum)
-   Acute hypoxic respiratory failure, currently on 3 L of oxygen, satting 97% -Associated with cough, congestion, possibly upper respiratory infection viral -Current respiratory panel including influenza A, B, RSV, COVID all negative -Chest x-ray: No acute cardiopulmonary findings, stable COPD -Currently patient is afebrile, normotensive with no leukocytosis -will avoid antibiotics -Continue supportive therapy, DuoNeb bronchodilators, supplemental oxygen, vitamin C , zinc , Sinex, -Encouraging spirometer and flutter valve -Goal to wean off oxygen to room air  - Obtaining CT angiogram to rule out PE or any other underlying pathology (given comorbidities-history)

## 2024-11-21 NOTE — Assessment & Plan Note (Signed)
 Stable, continue home medication of amlodipine .

## 2024-11-21 NOTE — Hospital Course (Addendum)
 Brandon [name] is a 84 year-old male COPD, not O2 dependent, history of AAA with repair, HTN,HLD,  depression ... Presents to ED with chief complaint of shortness of breath.  Reported shortness of breath with sudden, woke him up from sleep.  Reports has been going on subtly for past 2 days associate with productive cough clear in nature.  Denies any chest pain or shortness of breath.  Denies any abdominal pain.  Denies any fever or chills.  ED Evaluation: Blood pressure 113/84, pulse 87, temperature 97.9 F (36.6 C), resp. rate 17, SpO2 95% on 3 L of oxygen On arrival heart rate 110, RR 30, LABs: CBC CMP within normal limits, glucose 106, lactic acid 1.4 Respiratory panel: Influenza A/B RSV, COVID all negative

## 2024-11-21 NOTE — Assessment & Plan Note (Addendum)
 Remote history of subsequent falls -Avoiding chemical DVT prophylaxis such as heparin  and Lovenox 

## 2024-11-21 NOTE — Assessment & Plan Note (Addendum)
 History of AAA - 2013 -Obtaining CT angiogram for further evaluation - Patient denies any chest pain Last echo 08/04/2022, EF 55-60%, normal LV function, no LVH, no structural abnormalities reported  09/08/24 -vascular US  AAA duplex-last vascular study of abdominal aorta: Common iliac graft noted-around 2.5 centimeter

## 2024-11-21 NOTE — ED Triage Notes (Signed)
 PT BIB EMS from home for SOB given neb by EMS and pt felt relief. Afib seen by EMS. 125 mg solumedrol given en route. Aox4  EMS VS  96% RA  170/90 80-100 HR

## 2024-11-21 NOTE — Assessment & Plan Note (Signed)
"   Continue statin   "

## 2024-11-22 DIAGNOSIS — Z8679 Personal history of other diseases of the circulatory system: Secondary | ICD-10-CM | POA: Diagnosis not present

## 2024-11-22 DIAGNOSIS — I10 Essential (primary) hypertension: Secondary | ICD-10-CM | POA: Diagnosis present

## 2024-11-22 DIAGNOSIS — J441 Chronic obstructive pulmonary disease with (acute) exacerbation: Secondary | ICD-10-CM | POA: Diagnosis present

## 2024-11-22 DIAGNOSIS — Z79899 Other long term (current) drug therapy: Secondary | ICD-10-CM | POA: Diagnosis not present

## 2024-11-22 DIAGNOSIS — Z7951 Long term (current) use of inhaled steroids: Secondary | ICD-10-CM | POA: Diagnosis not present

## 2024-11-22 DIAGNOSIS — E669 Obesity, unspecified: Secondary | ICD-10-CM | POA: Diagnosis present

## 2024-11-22 DIAGNOSIS — Z87891 Personal history of nicotine dependence: Secondary | ICD-10-CM | POA: Diagnosis not present

## 2024-11-22 DIAGNOSIS — J9601 Acute respiratory failure with hypoxia: Secondary | ICD-10-CM | POA: Diagnosis present

## 2024-11-22 DIAGNOSIS — Z9981 Dependence on supplemental oxygen: Secondary | ICD-10-CM | POA: Diagnosis not present

## 2024-11-22 DIAGNOSIS — Z8249 Family history of ischemic heart disease and other diseases of the circulatory system: Secondary | ICD-10-CM | POA: Diagnosis not present

## 2024-11-22 DIAGNOSIS — Z7982 Long term (current) use of aspirin: Secondary | ICD-10-CM | POA: Diagnosis not present

## 2024-11-22 DIAGNOSIS — I739 Peripheral vascular disease, unspecified: Secondary | ICD-10-CM | POA: Diagnosis present

## 2024-11-22 DIAGNOSIS — Z7952 Long term (current) use of systemic steroids: Secondary | ICD-10-CM | POA: Diagnosis not present

## 2024-11-22 DIAGNOSIS — I4510 Unspecified right bundle-branch block: Secondary | ICD-10-CM | POA: Diagnosis present

## 2024-11-22 DIAGNOSIS — J069 Acute upper respiratory infection, unspecified: Secondary | ICD-10-CM | POA: Diagnosis present

## 2024-11-22 DIAGNOSIS — Z820 Family history of epilepsy and other diseases of the nervous system: Secondary | ICD-10-CM | POA: Diagnosis not present

## 2024-11-22 DIAGNOSIS — Z888 Allergy status to other drugs, medicaments and biological substances status: Secondary | ICD-10-CM | POA: Diagnosis not present

## 2024-11-22 DIAGNOSIS — Z809 Family history of malignant neoplasm, unspecified: Secondary | ICD-10-CM | POA: Diagnosis not present

## 2024-11-22 DIAGNOSIS — I2510 Atherosclerotic heart disease of native coronary artery without angina pectoris: Secondary | ICD-10-CM | POA: Diagnosis present

## 2024-11-22 DIAGNOSIS — Z6841 Body Mass Index (BMI) 40.0 and over, adult: Secondary | ICD-10-CM | POA: Diagnosis not present

## 2024-11-22 DIAGNOSIS — E785 Hyperlipidemia, unspecified: Secondary | ICD-10-CM | POA: Diagnosis present

## 2024-11-22 DIAGNOSIS — Z1152 Encounter for screening for COVID-19: Secondary | ICD-10-CM | POA: Diagnosis not present

## 2024-11-22 DIAGNOSIS — R0602 Shortness of breath: Secondary | ICD-10-CM | POA: Diagnosis present

## 2024-11-22 LAB — GLUCOSE, CAPILLARY
Glucose-Capillary: 148 mg/dL — ABNORMAL HIGH (ref 70–99)
Glucose-Capillary: 153 mg/dL — ABNORMAL HIGH (ref 70–99)

## 2024-11-22 MED ORDER — IRBESARTAN 150 MG PO TABS
150.0000 mg | ORAL_TABLET | Freq: Every day | ORAL | Status: DC
  Administered 2024-11-22 – 2024-11-28 (×7): 150 mg via ORAL
  Filled 2024-11-22 (×7): qty 1

## 2024-11-22 MED ORDER — ALUM & MAG HYDROXIDE-SIMETH 200-200-20 MG/5ML PO SUSP
15.0000 mL | Freq: Four times a day (QID) | ORAL | Status: DC | PRN
  Administered 2024-11-23: 15 mL via ORAL
  Filled 2024-11-22: qty 30

## 2024-11-22 NOTE — Plan of Care (Signed)

## 2024-11-22 NOTE — Progress Notes (Addendum)
" °  °  Durable Medical Equipment  (From admission, onward)           Start     Ordered   11/22/24 1405  For home use only DME Cane  Once        11/22/24 1404            "

## 2024-11-22 NOTE — Progress Notes (Signed)
 OT Cancellation Note - Screen and Sign Off  Patient Details Name: Brandon [name] MRN: [id number] DOB: 1941-01-14   Cancelled Treatment:    Reason Eval/Treat Not Completed: OT screened, no needs identified, will sign off (OT screen complete. Pt currently largely requiring Supervision for ADLs and mobility due to decreased activity tolerance and decreased strength, which acute PT is addressing with pt. No further benefit from acute OT and no equipment needs identified. OT is signing off at this time.)  Margarie Rockey HERO., OTR/L, MA Acute Rehab [phone]   Margarie FORBES Horns 11/22/2024, 9:47 AM

## 2024-11-22 NOTE — Progress Notes (Signed)
 " PROGRESS NOTE    Brandon [name]  FMW:[id number] DOB: 05/13/1941 DOA: 11/21/2024 PCP: Seabron Lenis, MD  Subjective: Patient feels a bit better today.  No new complaints.  No sputum.  Breathing little easier.  Denies nausea vomiting diarrhea or chest pain.  Physical therapy present on rounds.  Hospital Course: Brandon [name] is a 84 year-old male COPD, not O2 dependent, history of AAA with repair, HTN,HLD,  depression ... Presents to ED with chief complaint of shortness of breath.  Reported shortness of breath with sudden, woke him up from sleep.  Reports has been going on subtly for past 2 days associate with productive cough clear in nature.  Denies any chest pain or shortness of breath.  Denies any abdominal pain.  Denies any fever or chills.  ED Evaluation: Blood pressure 113/84, pulse 87, temperature 97.9 F (36.6 C), resp. rate 17, SpO2 95% on 3 L of oxygen On arrival heart rate 110, RR 30, LABs: CBC CMP within normal limits, glucose 106, lactic acid 1.4 Respiratory panel: Influenza A/B RSV, COVID all negative    Assessment and Plan:  * Acute hypoxic respiratory failure (HCC) - Acute hypoxic respiratory failure, currently on 3 L of oxygen, satting 97% -Associated with cough, congestion, possibly upper respiratory infection viral -Current respiratory panel including influenza A, B, RSV, COVID all negative -Chest x-ray: No acute cardiopulmonary findings, stable COPD -Currently patient is afebrile, normotensive with no leukocytosis -will avoid antibiotics -Continue supportive therapy, DuoNeb bronchodilators, supplemental oxygen, vitamin C , zinc , Sinex, -Encouraging spirometer and flutter valve -Goal to wean off oxygen to room air   - CT angiogram  -negative for acute PE No definite evidence of large central pulmonary embolus seen in the main pulmonary artery or main portions of the left and right pulmonary arteries. However, due to significant respiratory motion artifact, 2.  Extensive coronary artery calcifications are noted suggesting coronary artery disease. 3. Minimal bibasilar subsegmental atelectasis is noted. 4. Minimal cholelithiasis.     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) Mild signs of exacerbation requiring 3 L of oxygen, diffuse mild wheezing -Initiating IV steroids -Scheduled and as needed DuoNeb bronchodilator -encouraging incentive spirometer, flutter valve - Not O2 dependent at baseline   Hyperlipidemia Continue statin   Subdural hematoma (HCC) Remote history noted   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   Atherosclerosis of native artery of extremity with intermittent claudication Currently stable denies any chest pain, continue statins, aspirin , amlodipine    Peripheral vascular disease, unspecified Continue aspirin  and statins   AAA (abdominal aortic aneurysm, ruptured) (HCC) History of AAA - 2013 -Obtaining CT angiogram for further evaluation - Patient denies any chest pain Last echo 08/04/2022, EF 55-60%, normal LV function, no LVH, no structural abnormalities reported  09/08/24 -vascular US  AAA duplex-last vascular study of abdominal aorta: Common iliac graft noted-around 2.5 centimeter     DVT  prophylaxis: Place and maintain sequential compression device Start:  11/21/24 0919 Place TED hose Start: 11/21/24 0919 SCDs Start: 11/21/24 0914  SCDs   Code Status: Full Code Family Communication: No family at bedside Disposition Plan: Home Reason for continuing need for hospitalization: IV steroids, frequent nebulizer treatments  Objective: Vitals:   11/21/24 2131 11/22/24 0303 11/22/24 0415 11/22/24 0825  BP: 133/65     Pulse: 94     Resp:      Temp:      TempSrc:      SpO2: 92% 95%  92%  Weight:   120.8 kg   Height:        Intake/Output Summary (Last 24 hours) at 11/22/2024 1125 Last data filed at 11/22/2024 0820 Gross per 24 hour  Intake 860.2 ml  Output 2050 ml  Net -1189.8 ml   Filed Weights   11/21/24 1628 11/22/24 0415  Weight: 120.8 kg 120.8 kg    Examination:  Physical Exam Constitutional:      Appearance: He is well-developed. He is obese. He is ill-appearing.  Neck:     Vascular: No JVD.  Cardiovascular:     Rate and Rhythm: Normal rate and regular rhythm.  Pulmonary:     Effort: Tachypnea present.     Breath sounds: Examination of the right-upper field reveals decreased breath sounds and wheezing. Examination of the left-upper field reveals decreased breath sounds and wheezing. Examination of the right-middle field reveals decreased breath sounds and wheezing. Examination of the left-middle field reveals decreased breath sounds and wheezing. Examination of the right-lower field reveals decreased breath sounds and wheezing. Examination of the left-lower field reveals decreased breath sounds and wheezing. Decreased breath sounds and wheezing present.  Musculoskeletal:     Right lower leg: Edema present.     Left lower leg: Edema present.  Skin:    General: Skin is warm and dry.  Neurological:     Mental Status: He is alert and oriented to person, place, and time.  Psychiatric:        Mood and Affect: Mood normal.     Data Reviewed: I have personally  reviewed following labs and imaging studies  CBC: Recent Labs  Lab 11/21/24 0304 11/21/24 0324 11/21/24 0326  WBC 10.1  --   --   NEUTROABS 6.2  --   --   HGB 15.9 16.0 16.0  HCT 49.3 47.0 47.0  MCV 102.3*  --   --   PLT 277  --   --    Basic Metabolic Panel: Recent Labs  Lab 11/21/24 0304 11/21/24 0324 11/21/24 0326 11/21/24 2104  NA 139 140 140  --   K 4.3 4.2 4.2  --   CL 102 101  --   --   CO2 28  --   --   --   GLUCOSE 107* 106*  --   --   BUN 13 13  --   --   CREATININE 0.92 0.90  --   --   CALCIUM  9.2  --   --   --   MG  --   --   --  2.0  PHOS  --   --   --  2.6   GFR: Estimated Creatinine Clearance: 73.2 mL/min (by C-G formula based on SCr of 0.9 mg/dL). Liver Function Tests: Recent Labs  Lab 11/21/24 0304  AST 20  ALT 15  ALKPHOS 92  BILITOT 1.1  PROT 6.8  ALBUMIN  3.9   No results for input(s): LIPASE, AMYLASE in the last 168 hours. No  results for input(s): AMMONIA in the last 168 hours. Coagulation Profile: Recent Labs  Lab 11/21/24 2104  INR 1.2   Cardiac Enzymes: No results for input(s): CKTOTAL, CKMB, CKMBINDEX, TROPONINI in the last 168 hours. ProBNP, BNP (last 5 results) Recent Labs    06/24/24 1530 11/21/24 0304  PROBNP  --  66.0  BNP 57.0  --    HbA1C: No results for input(s): HGBA1C in the last 72 hours. CBG: Recent Labs  Lab 11/22/24 1112  GLUCAP 148*   Lipid Profile: No results for input(s): CHOL, HDL, LDLCALC, TRIG, CHOLHDL, LDLDIRECT in the last 72 hours. Thyroid  Function Tests: No results for input(s): TSH, T4TOTAL, FREET4, T3FREE, THYROIDAB in the last 72 hours. Anemia Panel: No results for input(s): VITAMINB12, FOLATE, FERRITIN, TIBC, IRON, RETICCTPCT in the last 72 hours. Sepsis Labs: Recent Labs  Lab 11/21/24 0324 11/21/24 0517  LATICACIDVEN 1.7 1.4    Recent Results (from the past 240 hours)  Resp panel by RT-PCR (RSV, Flu A&B, Covid) Anterior Nasal  Swab     Status: None   Collection Time: 11/21/24  3:00 AM   Specimen: Anterior Nasal Swab  Result Value Ref Range Status   SARS Coronavirus 2 by RT PCR NEGATIVE NEGATIVE Final   Influenza A by PCR NEGATIVE NEGATIVE Final   Influenza B by PCR NEGATIVE NEGATIVE Final    Comment: (NOTE) The Xpert Xpress SARS-CoV-2/FLU/RSV plus assay is intended as an aid in the diagnosis of influenza from Nasopharyngeal swab specimens and should not be used as a sole basis for treatment. Nasal washings and aspirates are unacceptable for Xpert Xpress SARS-CoV-2/FLU/RSV testing.  Fact Sheet for Patients: bloggercourse.com  Fact Sheet for Healthcare Providers: seriousbroker.it  This test is not yet approved or cleared by the [place]  FDA and has been authorized for detection and/or diagnosis of SARS-CoV-2 by FDA under an Emergency Use Authorization (EUA). This EUA will remain in effect (meaning this test can be used) for the duration of the COVID-19 declaration under Section 564(b)(1) of the Act, 21 U.S.C. section 360bbb-3(b)(1), unless the authorization is terminated or revoked.     Resp Syncytial Virus by PCR NEGATIVE NEGATIVE Final    Comment: (NOTE) Fact Sheet for Patients: bloggercourse.com  Fact Sheet for Healthcare Providers: seriousbroker.it  This test is not yet approved or cleared by the [place]  FDA and has been authorized for detection and/or diagnosis of SARS-CoV-2 by FDA under an Emergency Use Authorization (EUA). This EUA will remain in effect (meaning this test can be used) for the duration of the COVID-19 declaration under Section 564(b)(1) of the Act, 21 U.S.C. section 360bbb-3(b)(1), unless the authorization is terminated or revoked.  Performed at [facility], [address] [place], [place] [address]   Respiratory (~20 pathogens) panel by PCR     Status:  None   Collection Time: 11/21/24  2:14 PM   Specimen: Nasopharyngeal Swab; Respiratory  Result Value Ref Range Status   Adenovirus NOT DETECTED NOT DETECTED Final   Coronavirus 229E NOT DETECTED NOT DETECTED Final    Comment: (NOTE) The Coronavirus on the Respiratory Panel, DOES NOT test for the novel  Coronavirus (2019 nCoV)    Coronavirus HKU1 NOT DETECTED NOT DETECTED Final   Coronavirus NL63 NOT DETECTED NOT DETECTED Final   Coronavirus OC43 NOT DETECTED NOT DETECTED Final   Metapneumovirus NOT DETECTED NOT DETECTED Final   Rhinovirus / Enterovirus NOT DETECTED NOT DETECTED Final   Influenza A NOT DETECTED NOT DETECTED Final  Influenza B NOT DETECTED NOT DETECTED Final   Parainfluenza Virus 1 NOT DETECTED NOT DETECTED Final   Parainfluenza Virus 2 NOT DETECTED NOT DETECTED Final   Parainfluenza Virus 3 NOT DETECTED NOT DETECTED Final   Parainfluenza Virus 4 NOT DETECTED NOT DETECTED Final   Respiratory Syncytial Virus NOT DETECTED NOT DETECTED Final   Bordetella pertussis NOT DETECTED NOT DETECTED Final   Bordetella Parapertussis NOT DETECTED NOT DETECTED Final   Chlamydophila pneumoniae NOT DETECTED NOT DETECTED Final   Mycoplasma pneumoniae NOT DETECTED NOT DETECTED Final    Comment: Performed at [facility], [address], [place], [place] [address]     Radiology Studies: CT Angio Chest Pulmonary Embolism (PE) W or WO Contrast Result Date: 11/21/2024 CLINICAL DATA:  Chronic dyspnea EXAM: CT ANGIOGRAPHY CHEST WITH CONTRAST TECHNIQUE: Multidetector CT imaging of the chest was performed using the standard protocol during bolus administration of intravenous contrast. Multiplanar CT image reconstructions and MIPs were obtained to evaluate the vascular anatomy. RADIATION DOSE REDUCTION: This exam was performed according to the departmental dose-optimization program which includes automated exposure control, adjustment of the mA and/or kV according to patient size and/or use of  iterative reconstruction technique. CONTRAST:  75mL OMNIPAQUE  IOHEXOL  350 MG/ML SOLN COMPARISON:  June 24, 2024 FINDINGS: Cardiovascular: Normal cardiac size. No pericardial effusion. Extensive coronary artery calcifications are noted. Aortic atherosclerosis without aneurysm formation. No definite evidence of large central pulmonary embolus seen in the main pulmonary artery or main portions of the left and right pulmonary arteries. However, due to significant respiratory motion artifact, the lower lobe branches of both pulmonary arteries are not well visualized, and therefore smaller and more peripheral pulmonary emboli cannot be excluded on the basis of this exam. Mediastinum/Nodes: No enlarged mediastinal, hilar, or axillary lymph nodes. Thyroid  gland, trachea, and esophagus demonstrate no significant findings. Lungs/Pleura: No pneumothorax or pleural effusion is noted. Mild emphysematous disease is noted. Minimal bibasilar subsegmental atelectasis is noted. Upper Abdomen: Minimal cholelithiasis. Musculoskeletal: Old healed sternal fracture is noted. No acute osseous abnormality is noted. Review of the MIP images confirms the above findings. IMPRESSION: 1. No definite evidence of large central pulmonary embolus seen in the main pulmonary artery or main portions of the left and right pulmonary arteries. However, due to significant respiratory motion artifact, the lower lobe branches of both pulmonary arteries are not well visualized, and therefore smaller and more peripheral pulmonary emboli cannot be excluded on the basis of this exam. 2. Extensive coronary artery calcifications are noted suggesting coronary artery disease. 3. Minimal bibasilar subsegmental atelectasis is noted. 4. Minimal cholelithiasis. Aortic Atherosclerosis (ICD10-I70.0) and Emphysema (ICD10-J43.9). Electronically Signed   By: Lynwood Landy Raddle M.D.   On: 11/21/2024 12:31   DG Chest Port 1 View Result Date: 11/21/2024 EXAM: 1 VIEW(S) XRAY OF  THE CHEST 11/21/2024 03:25:00 AM COMPARISON: PA and lateral chest 10/17/2024. CLINICAL HISTORY: sob sob FINDINGS: LINES, TUBES AND DEVICES: Telemetry leads overlying the chest. LUNGS AND PLEURA: The lungs are mildly emphysematous. No focal pulmonary opacity. No pleural effusion. No pneumothorax. No vascular congestion is seen. HEART AND MEDIASTINUM: The heart is slightly enlarged. There is mild aortic tortuosity and atherosclerosis with a stable mediastinum. BONES AND SOFT TISSUES: Osteopenia and thoracic spondylosis. IMPRESSION: 1. No acute findings. Stable COPD chest . 2. Slightly enlarged heart without vascular congestion. Electronically signed by: Francis Quam MD 11/21/2024 03:44 AM EST RP Workstation: [id number]    Scheduled Meds:  amLODipine   5 mg Oral Daily   ascorbic  acid  500 mg Oral Daily   aspirin   81 mg Oral Daily   atorvastatin   20 mg Oral Daily   chlorpheniramine-HYDROcodone   5 mL Oral Q12H   citalopram   20 mg Oral Daily   guaiFENesin   10 mL Oral Q8H   levalbuterol   1.25 mg Nebulization Q6H   methylPREDNISolone  (SOLU-MEDROL ) injection  40 mg Intravenous Q12H   senna-docusate  1 tablet Oral QHS   sodium chloride  flush  3 mL Intravenous Q12H   sodium chloride  flush  3 mL Intravenous Q12H   zinc  sulfate (50mg  elemental zinc )  220 mg Oral Daily   Continuous Infusions:  lactated ringers        LOS: 0 days   Time spent: 35 minutes  Lonni KANDICE Moose, MD  [facility]  11/22/2024, 11:25 AM   "

## 2024-11-22 NOTE — Progress Notes (Addendum)
 Transition of Care [facility]) - Inpatient Brief Assessment   Patient Details  Name: Brandon [name] MRN: [id number] Date of Birth: [birth year]  [facility] of [facility]) CM/SW Contact:    Rosaline JONELLE Joe, RN Phone Number: 11/22/2024, 2:11 PM   Clinical Narrative: CM met with the patient at the bedside.  The patient admitted to the hospital for SOB, pulmonary edema.  The patient plans to return home with spouse when stable.  The patient has shower seat and nebulizer in the home.    Patient will need a Cane for home.  Patient was offered choice regarding DME company and patient did not have a preference.  I called [employer] and asked to have Cane delivered to the hospital room.  DMe order for cane and progress note placed - to be co-signed by MD.  Patient was agreeable for OPPT referral.  Ref[facility] to [facility] to [facility] location - to be co-signed by MD.  Patient plans to return home when stable.  Patient is currently on oxygen in the hospital and will need to be weaned off oxygen prior to discharge to home.   Transition of Care Asessment: Insurance and Status: (P) Insurance coverage has been reviewed Patient has primary care physician: (P) Yes Home environment has been reviewed: (P) from home with spouse Prior level of function:: (P) self Prior/Current Home Services: (P) No current home services Social Drivers of Health Review: (P) SDOH reviewed interventions complete Readmission risk has been reviewed: (P) Yes Transition of care needs: (P) transition of care needs identified, TOC will continue to follow

## 2024-11-22 NOTE — Evaluation (Signed)
 Physical Therapy Evaluation and Discharge Patient Details Name: Brandon [name] MRN: [id number] DOB: 03/26/1941 Today's Date: 11/22/2024  History of Present Illness  84 year-old male presents 1/7 to ED with chief complaint of shortness of breath. Dx Acute hypoxic respiratory failure. PMH: COPD, not O2 dependent, history of AAA with repair, HTN, depression.  Clinical Impression  Patient evaluated by Physical Therapy with no further acute PT needs identified. All education has been completed and the patient has no further questions. 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 Reports he notices he is becoming more deconditioned. Today, during evaluation, patient able to ambulate 2 bouts of 200 feet at a supervision level with subjective mRPE of 5/10. Feels close to baseline other than feeling a little weaker.  Due to initially lightly touching furniture and rail in hallway, suggest SPC use. Reviewed HEP with pt. Demonstrates adequate strength to navigate his home entry with rail at home.  PT is signing off. Thank you for this referral.    SpO2 at rest on 2.5L 98%; at rest on RA 94%;  SpO2 ambulating on RA 86% (RPE 5); Ambulating on 2L 98%    If plan is discharge home, recommend the following: Assist for transportation     Equipment Recommendations Cane     Functional Status Assessment Patient has had a recent decline in their functional status and demonstrates the ability to make significant improvements in function in a reasonable and predictable amount of time.     Precautions / Restrictions Precautions Precautions: Fall Recall of Precautions/Restrictions: Intact Precaution/Restrictions Comments: Monitor O2 Restrictions Weight Bearing Restrictions Per Provider Order: No      Mobility  Bed Mobility Overal bed mobility: Modified Independent             General bed mobility comments: extra time    Transfers Overall  transfer level: Modified independent Equipment used: None               General transfer comment: Slow to rise from low surface but able to complete several times without UE support.    Ambulation/Gait Ambulation/Gait assistance: Supervision Gait Distance (Feet): 200 Feet (x2) Assistive device: None Gait Pattern/deviations: Step-through pattern, Drifts right/left       General Gait Details: Minor instability but able to self correct, lightly touching rail initially in hallway but reduced reliance to no support majority of distance and not at all on secound bout.  Stairs            Wheelchair Mobility     Tilt Bed    Modified Rankin (Stroke Patients Only)       Balance                                             Pertinent Vitals/Pain Pain Assessment Pain Assessment: No/denies pain    Home Living Family/patient expects to be discharged to:: Private residence Living Arrangements: Spouse/significant other Available Help at Discharge: Family;Available 24 hours/day Type of Home: House Home Access: Stairs to enter Entrance Stairs-Rails: Can reach both Entrance Stairs-Number of Steps: 3   Home Layout: One level Home Equipment: Shower seat - built in      Prior Function Prior Level of Function : Independent/Modified Independent;Driving             Mobility Comments: ind, no falls, no dme  ADLs Comments: ind, drives, retired.     Extremity/Trunk Assessment   Upper Extremity Assessment Upper Extremity Assessment: Defer to OT evaluation    Lower Extremity Assessment Lower Extremity Assessment: Generalized weakness       Communication   Communication Communication: Impaired Factors Affecting Communication: Hearing impaired    Cognition Arousal: Alert Behavior During Therapy: WFL for tasks assessed/performed   PT - Cognitive impairments: No apparent impairments                         Following commands:  Intact       Cueing Cueing Techniques: Verbal cues     General Comments General comments (skin integrity, edema, etc.): SpO2 at rest on 2.5L 98%; at rest on RA 94%; ambulating on RA 86% (RPE 5); Ambulating on 2L 98%    Exercises General Exercises - Lower Extremity Ankle Circles/Pumps: AROM, Both, 10 reps, Seated Long Arc Quad: Strengthening, Both, 10 reps, Seated Other Exercises Other Exercises: Sit to stand x5 with hands supported on back of chair. Instructed to start with 5 reps, 2-3 times per day.   Assessment/Plan    PT Assessment Patient does not need any further PT services  PT Problem List         PT Treatment Interventions      PT Goals (Current goals can be found in the Care Plan section)  Acute Rehab PT Goals Patient Stated Goal: Get well, improve strength and endurance again PT Goal Formulation: All assessment and education complete, DC therapy    Frequency       Co-evaluation               AM-PAC PT 6 Clicks Mobility  Outcome Measure Help needed turning from your back to your side while in a flat bed without using bedrails?: None Help needed moving from lying on your back to sitting on the side of a flat bed without using bedrails?: None Help needed moving to and from a bed to a chair (including a wheelchair)?: None Help needed standing up from a chair using your arms (e.g., wheelchair or bedside chair)?: A Little Help needed to walk in hospital room?: A Little Help needed climbing 3-5 steps with a railing? : A Little 6 Click Score: 21    End of Session Equipment Utilized During Treatment: Gait belt;Oxygen Activity Tolerance: Patient tolerated treatment well Patient left: in bed;with call bell/phone within reach;with nursing/sitter in room;with family/visitor present Nurse Communication: Mobility status PT Visit Diagnosis: Other abnormalities of gait and mobility (R26.89);Muscle weakness (generalized) (M62.81)    Time: 9149-9066 PT Time  Calculation (min) (ACUTE ONLY): 43 min   Charges:   PT Evaluation $PT Eval Low Complexity: 1 Low PT Treatments $Gait Training: 8-22 mins $Therapeutic Exercise: 8-22 mins PT General Charges $$ ACUTE PT VISIT: 1 Visit         Leontine Roads, PT, DPT [facility] Services Physical Therapist Office: [phone] Website: [email or web address]   Leontine GORMAN Roads 11/22/2024, 9:56 AM

## 2024-11-23 LAB — BASIC METABOLIC PANEL WITH GFR
Anion gap: 11 (ref 5–15)
BUN: 25 mg/dL — ABNORMAL HIGH (ref 8–23)
CO2: 26 mmol/L (ref 22–32)
Calcium: 8.8 mg/dL — ABNORMAL LOW (ref 8.9–10.3)
Chloride: 99 mmol/L (ref 98–111)
Creatinine, Ser: 0.93 mg/dL (ref 0.61–1.24)
GFR, Estimated: >60 mL/min
Glucose, Bld: 138 mg/dL — ABNORMAL HIGH (ref 70–99)
Potassium: 4.5 mmol/L (ref 3.5–5.1)
Sodium: 136 mmol/L (ref 135–145)

## 2024-11-23 LAB — GLUCOSE, CAPILLARY: Glucose-Capillary: 147 mg/dL — ABNORMAL HIGH (ref 70–99)

## 2024-11-23 NOTE — Plan of Care (Signed)

## 2024-11-23 NOTE — Progress Notes (Signed)
 " PROGRESS NOTE    Brandon [name]  FMW:[id number] DOB: Oct 28, 1941 DOA: 11/21/2024 PCP: Seabron Lenis, MD  Subjective: Patient feels a bit better each day.  No new complaints.  No sputum.  Breathing little easier.  Denies nausea vomiting diarrhea or chest pain.  Down to 2 L/min O2  Hospital Course: Brandon [name] is a 84 year-old male COPD, not O2 dependent, history of AAA with repair, HTN,HLD,  depression ... Presents to ED with chief complaint of shortness of breath.  Reported shortness of breath with sudden, woke him up from sleep.  Reports has been going on subtly for past 2 days associate with productive cough clear in nature.  Denies any chest pain or shortness of breath.  Denies any abdominal pain.  Denies any fever or chills.  ED Evaluation: Blood pressure 113/84, pulse 87, temperature 97.9 F (36.6 C), resp. rate 17, SpO2 95% on 3 L of oxygen On arrival heart rate 110, RR 30, LABs: CBC CMP within normal limits, glucose 106, lactic acid 1.4 Respiratory panel: Influenza A/B RSV, COVID all negative    Assessment and Plan:  * Acute hypoxic respiratory failure (HCC) - Acute hypoxic respiratory failure, currently on 2L of oxygen, satting 97% -Associated with cough, congestion, possibly upper respiratory infection viral -Current respiratory panel including influenza A, B, RSV, COVID all negative -Chest x-ray: No acute cardiopulmonary findings, stable COPD -Currently patient is afebrile, normotensive with no leukocytosis -will avoid antibiotics -Continue supportive therapy, DuoNeb bronchodilators, supplemental oxygen, vitamin C , zinc , Sinex, -Encouraging spirometer and flutter valve -Goal to wean off oxygen to room air   - CT angiogram  -negative for acute PE No definite evidence of large central pulmonary embolus seen in the main pulmonary artery or main portions of the left and right pulmonary arteries. However, due to significant respiratory motion artifact, 2. Extensive coronary  artery calcifications are noted suggesting coronary artery disease. 3. Minimal bibasilar subsegmental atelectasis is noted. 4. Minimal cholelithiasis.     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) Mild signs of exacerbation requiring 3 L of oxygen, diffuse mild wheezing -Initiating IV steroids -Scheduled and as needed DuoNeb bronchodilator -encouraging incentive spirometer, flutter valve - Not O2 dependent at baseline   Hyperlipidemia Continue statin   Subdural hematoma (HCC) Remote history noted   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   Atherosclerosis of native artery of extremity with intermittent claudication Currently stable denies any chest pain, continue statins, aspirin , amlodipine    Peripheral vascular disease, unspecified Continue aspirin  and statins   AAA (abdominal aortic aneurysm, ruptured) (HCC) History of AAA - 2013 -Obtaining CT angiogram for further evaluation - Patient denies any chest pain Last echo 08/04/2022, EF 55-60%, normal LV function, no LVH, no structural abnormalities reported  09/08/24 -vascular US  AAA duplex-last vascular study of abdominal aorta: Common iliac graft noted-around 2.5 centimeter     DVT prophylaxis: Place and  maintain sequential compression device Start:  11/21/24 0919 Place TED hose Start: 11/21/24 0919 SCDs Start: 11/21/24 0914  SCDs   Code Status: Full Code Family Communication: No family at bedside Disposition Plan: Home Reason for continuing need for hospitalization: IV steroids, frequent nebulizer treatments remains hypoxic requiring liters per minute oxygen  Objective: Vitals:   11/22/24 2111 11/23/24 0002 11/23/24 0343 11/23/24 0348  BP: 125/70 130/80 134/74   Pulse: 87 77 71   Resp: 20 20 (!) 21   Temp: 97.6 F (36.4 C) (!) 97.5 F (36.4 C) (!) 97.3 F (36.3 C)   TempSrc:      SpO2: 97% 100% 96% 95%  Weight:   119.5 kg   Height:        Intake/Output Summary (Last 24 hours) at 11/23/2024 0755 Last data filed at 11/23/2024 0600 Gross per 24 hour  Intake 480 ml  Output 750 ml  Net -270 ml   Filed Weights   11/21/24 1628 11/22/24 0415 11/23/24 0343  Weight: 120.8 kg 120.8 kg 119.5 kg    Examination:  Physical Exam Constitutional:      Appearance: He is well-developed. He is obese. He is ill-appearing.  Neck:     Vascular: No JVD.  Cardiovascular:     Rate and Rhythm: Normal rate and regular rhythm.  Pulmonary:     Effort: Tachypnea present.     Breath sounds: Examination of the right-upper field reveals decreased breath sounds and wheezing. Examination of the left-upper field reveals decreased breath sounds and wheezing. Examination of the right-middle field reveals decreased breath sounds and wheezing. Examination of the left-middle field reveals decreased breath sounds and wheezing. Examination of the right-lower field reveals decreased breath sounds and wheezing. Examination of the left-lower field reveals decreased breath sounds and wheezing. Decreased breath sounds and wheezing present. No rhonchi or rales.  Musculoskeletal:     Right lower leg: Edema present.     Left lower leg: Edema present.  Skin:    General: Skin is warm and dry.  Neurological:     Mental  Status: He is alert and oriented to person, place, and time.  Psychiatric:        Mood and Affect: Mood normal.     Data Reviewed: I have personally reviewed following labs and imaging studies  CBC: Recent Labs  Lab 11/21/24 0304 11/21/24 0324 11/21/24 0326  WBC 10.1  --   --   NEUTROABS 6.2  --   --   HGB 15.9 16.0 16.0  HCT 49.3 47.0 47.0  MCV 102.3*  --   --   PLT 277  --   --    Basic Metabolic Panel: Recent Labs  Lab 11/21/24 0304 11/21/24 0324 11/21/24 0326 11/21/24 2104 11/23/24 0417  NA 139 140 140  --  136  K 4.3 4.2 4.2  --  4.5  CL 102 101  --   --  99  CO2 28  --   --   --  26  GLUCOSE 107* 106*  --   --  138*  BUN 13 13  --   --  25*  CREATININE 0.92 0.90  --   --  0.93  CALCIUM  9.2  --   --   --  8.8*  MG  --   --   --  2.0  --   PHOS  --   --   --  2.6  --    GFR: Estimated Creatinine Clearance: 70.4 mL/min (by C-G formula based on SCr of  0.93 mg/dL). Liver Function Tests: Recent Labs  Lab 11/21/24 0304  AST 20  ALT 15  ALKPHOS 92  BILITOT 1.1  PROT 6.8  ALBUMIN  3.9   No results for input(s): LIPASE, AMYLASE in the last 168 hours. No results for input(s): AMMONIA in the last 168 hours. Coagulation Profile: Recent Labs  Lab 11/21/24 2104  INR 1.2   Cardiac Enzymes: No results for input(s): CKTOTAL, CKMB, CKMBINDEX, TROPONINI in the last 168 hours. ProBNP, BNP (last 5 results) Recent Labs    06/24/24 1530 11/21/24 0304  PROBNP  --  66.0  BNP 57.0  --    HbA1C: No results for input(s): HGBA1C in the last 72 hours. CBG: Recent Labs  Lab 11/22/24 1112 11/22/24 1736  GLUCAP 148* 153*   Lipid Profile: No results for input(s): CHOL, HDL, LDLCALC, TRIG, CHOLHDL, LDLDIRECT in the last 72 hours. Thyroid  Function Tests: No results for input(s): TSH, T4TOTAL, FREET4, T3FREE, THYROIDAB in the last 72 hours. Anemia Panel: No results for input(s): VITAMINB12, FOLATE, FERRITIN, TIBC,  IRON, RETICCTPCT in the last 72 hours. Sepsis Labs: Recent Labs  Lab 11/21/24 0324 11/21/24 0517  LATICACIDVEN 1.7 1.4    Recent Results (from the past 240 hours)  Resp panel by RT-PCR (RSV, Flu A&B, Covid) Anterior Nasal Swab     Status: None   Collection Time: 11/21/24  3:00 AM   Specimen: Anterior Nasal Swab  Result Value Ref Range Status   SARS Coronavirus 2 by RT PCR NEGATIVE NEGATIVE Final   Influenza A by PCR NEGATIVE NEGATIVE Final   Influenza B by PCR NEGATIVE NEGATIVE Final    Comment: (NOTE) The Xpert Xpress SARS-CoV-2/FLU/RSV plus assay is intended as an aid in the diagnosis of influenza from Nasopharyngeal swab specimens and should not be used as a sole basis for treatment. Nasal washings and aspirates are unacceptable for Xpert Xpress SARS-CoV-2/FLU/RSV testing.  Fact Sheet for Patients: bloggercourse.com  Fact Sheet for Healthcare Providers: seriousbroker.it  This test is not yet approved or cleared by the [place]  FDA and has been authorized for detection and/or diagnosis of SARS-CoV-2 by FDA under an Emergency Use Authorization (EUA). This EUA will remain in effect (meaning this test can be used) for the duration of the COVID-19 declaration under Section 564(b)(1) of the Act, 21 U.S.C. section 360bbb-3(b)(1), unless the authorization is terminated or revoked.     Resp Syncytial Virus by PCR NEGATIVE NEGATIVE Final    Comment: (NOTE) Fact Sheet for Patients: bloggercourse.com  Fact Sheet for Healthcare Providers: seriousbroker.it  This test is not yet approved or cleared by the [place]  FDA and has been authorized for detection and/or diagnosis of SARS-CoV-2 by FDA under an Emergency Use Authorization (EUA). This EUA will remain in effect (meaning this test can be used) for the duration of the COVID-19 declaration under Section 564(b)(1) of  the Act, 21 U.S.C. section 360bbb-3(b)(1), unless the authorization is terminated or revoked.  Performed at [facility], [address], [place], [place] [address]   Respiratory (~20 pathogens) panel by PCR     Status: None   Collection Time: 11/21/24  2:14 PM   Specimen: Nasopharyngeal Swab; Respiratory  Result Value Ref Range Status   Adenovirus NOT DETECTED NOT DETECTED Final   Coronavirus 229E NOT DETECTED NOT DETECTED Final    Comment: (NOTE) The Coronavirus on the Respiratory Panel, DOES NOT test for the novel  Coronavirus (2019 nCoV)    Coronavirus HKU1 NOT DETECTED NOT DETECTED Final  Coronavirus NL63 NOT DETECTED NOT DETECTED Final   Coronavirus OC43 NOT DETECTED NOT DETECTED Final   Metapneumovirus NOT DETECTED NOT DETECTED Final   Rhinovirus / Enterovirus NOT DETECTED NOT DETECTED Final   Influenza A NOT DETECTED NOT DETECTED Final   Influenza B NOT DETECTED NOT DETECTED Final   Parainfluenza Virus 1 NOT DETECTED NOT DETECTED Final   Parainfluenza Virus 2 NOT DETECTED NOT DETECTED Final   Parainfluenza Virus 3 NOT DETECTED NOT DETECTED Final   Parainfluenza Virus 4 NOT DETECTED NOT DETECTED Final   Respiratory Syncytial Virus NOT DETECTED NOT DETECTED Final   Bordetella pertussis NOT DETECTED NOT DETECTED Final   Bordetella Parapertussis NOT DETECTED NOT DETECTED Final   Chlamydophila pneumoniae NOT DETECTED NOT DETECTED Final   Mycoplasma pneumoniae NOT DETECTED NOT DETECTED Final    Comment: Performed at [facility], [address], [place], [place] [address]     Radiology Studies: CT Angio Chest Pulmonary Embolism (PE) W or WO Contrast Result Date: 11/21/2024 CLINICAL DATA:  Chronic dyspnea EXAM: CT ANGIOGRAPHY CHEST WITH CONTRAST TECHNIQUE: Multidetector CT imaging of the chest was performed using the standard protocol during bolus administration of intravenous contrast. Multiplanar CT image reconstructions and MIPs were obtained to evaluate the  vascular anatomy. RADIATION DOSE REDUCTION: This exam was performed according to the departmental dose-optimization program which includes automated exposure control, adjustment of the mA and/or kV according to patient size and/or use of iterative reconstruction technique. CONTRAST:  75mL OMNIPAQUE  IOHEXOL  350 MG/ML SOLN COMPARISON:  June 24, 2024 FINDINGS: Cardiovascular: Normal cardiac size. No pericardial effusion. Extensive coronary artery calcifications are noted. Aortic atherosclerosis without aneurysm formation. No definite evidence of large central pulmonary embolus seen in the main pulmonary artery or main portions of the left and right pulmonary arteries. However, due to significant respiratory motion artifact, the lower lobe branches of both pulmonary arteries are not well visualized, and therefore smaller and more peripheral pulmonary emboli cannot be excluded on the basis of this exam. Mediastinum/Nodes: No enlarged mediastinal, hilar, or axillary lymph nodes. Thyroid  gland, trachea, and esophagus demonstrate no significant findings. Lungs/Pleura: No pneumothorax or pleural effusion is noted. Mild emphysematous disease is noted. Minimal bibasilar subsegmental atelectasis is noted. Upper Abdomen: Minimal cholelithiasis. Musculoskeletal: Old healed sternal fracture is noted. No acute osseous abnormality is noted. Review of the MIP images confirms the above findings. IMPRESSION: 1. No definite evidence of large central pulmonary embolus seen in the main pulmonary artery or main portions of the left and right pulmonary arteries. However, due to significant respiratory motion artifact, the lower lobe branches of both pulmonary arteries are not well visualized, and therefore smaller and more peripheral pulmonary emboli cannot be excluded on the basis of this exam. 2. Extensive coronary artery calcifications are noted suggesting coronary artery disease. 3. Minimal bibasilar subsegmental atelectasis is noted.  4. Minimal cholelithiasis. Aortic Atherosclerosis (ICD10-I70.0) and Emphysema (ICD10-J43.9). Electronically Signed   By: Lynwood Landy Raddle M.D.   On: 11/21/2024 12:31    Scheduled Meds:  ascorbic acid   500 mg Oral Daily   aspirin   81 mg Oral Daily   atorvastatin   20 mg Oral Daily   chlorpheniramine-HYDROcodone   5 mL Oral Q12H   citalopram   20 mg Oral Daily   guaiFENesin   10 mL Oral Q8H   irbesartan   150 mg Oral Daily   levalbuterol   1.25 mg Nebulization Q6H   methylPREDNISolone  (SOLU-MEDROL ) injection  40 mg Intravenous Q12H   senna-docusate  1 tablet Oral QHS  sodium chloride  flush  3 mL Intravenous Q12H   sodium chloride  flush  3 mL Intravenous Q12H   zinc  sulfate (50mg  elemental zinc )  220 mg Oral Daily   Continuous Infusions:  lactated ringers        LOS: 1 day   Time spent: 35 minutes  Lonni KANDICE Moose, MD  [facility]  11/23/2024, 7:55 AM   "

## 2024-11-24 LAB — GLUCOSE, CAPILLARY: Glucose-Capillary: 139 mg/dL — ABNORMAL HIGH (ref 70–99)

## 2024-11-24 MED ORDER — ENOXAPARIN SODIUM 60 MG/0.6ML IJ SOSY
60.0000 mg | PREFILLED_SYRINGE | INTRAMUSCULAR | Status: DC
  Administered 2024-11-24 – 2024-11-28 (×5): 60 mg via SUBCUTANEOUS
  Filled 2024-11-24 (×5): qty 0.6

## 2024-11-24 MED ORDER — PANTOPRAZOLE SODIUM 40 MG PO TBEC
40.0000 mg | DELAYED_RELEASE_TABLET | Freq: Every day | ORAL | Status: DC
  Administered 2024-11-24 – 2024-11-28 (×5): 40 mg via ORAL
  Filled 2024-11-24 (×5): qty 1

## 2024-11-24 NOTE — Plan of Care (Signed)

## 2024-11-24 NOTE — Progress Notes (Signed)
 SATURATION QUALIFICATIONS: (This note is used to comply with regulatory documentation for home oxygen)  Patient Saturations on Room Air at Rest = 87%  Patient Saturations on Room Air while Ambulating = 82%  Patient Saturations on 2 Liters of oxygen while Ambulating = 92%

## 2024-11-24 NOTE — Progress Notes (Signed)
 " PROGRESS NOTE    Brandon [name]  FMW:[id number] DOB: Feb 01, 1941 DOA: 11/21/2024 PCP: Seabron Lenis, MD  Subjective: Patient feels a bit better each day.  No new complaints.  No sputum.  Breathing little easier.  Denies nausea vomiting diarrhea or chest pain.  Down to 2 L/min O2 11/24/24: I discussed with the respiratory therapist.  Will wean keep sats 90 to 92%.  Then ambulate and check O2 sat.  Currently he is eating breakfast.  Looks comfortable.  Patient failed walk test.  He does currently require oxygen see respiratory therapy note.  I returned to the room later in the afternoon and spoke to 2 of his sisters advised that he likely need to go home on oxygen.    Hospital Course: Brandon [name] is a 84 year-old male COPD, not O2 dependent, history of AAA with repair, HTN,HLD,  depression ... Presents to ED with chief complaint of shortness of breath.  Reported shortness of breath with sudden, woke him up from sleep.  Reports has been going on subtly for past 2 days associate with productive cough clear in nature.  Denies any chest pain or shortness of breath.  Denies any abdominal pain.  Denies any fever or chills.  ED Evaluation: Blood pressure 113/84, pulse 87, temperature 97.9 F (36.6 C), resp. rate 17, SpO2 95% on 3 L of oxygen On arrival heart rate 110, RR 30, LABs: CBC CMP within normal limits, glucose 106, lactic acid 1.4 Respiratory panel: Influenza A/B RSV, COVID all negative    Assessment and Plan:  * Acute hypoxic respiratory failure (HCC) - Acute hypoxic respiratory failure, currently on 2L of oxygen, satting 97% -Associated with cough, congestion, possibly upper respiratory infection viral -Current respiratory panel including influenza A, B, RSV, COVID all negative -Chest x-ray: No acute cardiopulmonary findings, stable COPD -Currently patient is afebrile, normotensive with no leukocytosis -will avoid antibiotics -Continue supportive therapy, DuoNeb bronchodilators,  supplemental oxygen, vitamin C , zinc , Sinex, -Encouraging spirometer and flutter valve -Goal to wean off oxygen to room air   - CT angiogram  -negative for acute PE No definite evidence of large central pulmonary embolus seen in the main pulmonary artery or main portions of the left and right pulmonary arteries. However, due to significant respiratory motion artifact, 2. Extensive coronary artery calcifications are noted suggesting coronary artery disease. 3. Minimal bibasilar subsegmental atelectasis is noted. 4. Minimal cholelithiasis.     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) Mild signs of exacerbation requiring 3 L of oxygen, diffuse mild wheezing -Initiating IV steroids -Scheduled and as needed DuoNeb bronchodilator -encouraging incentive spirometer, flutter valve - Not O2 dependent at baseline   Hyperlipidemia Continue statin   Subdural hematoma (HCC) Remote history noted   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   Atherosclerosis of native artery of extremity with intermittent claudication Currently stable denies any chest pain, continue statins, aspirin , amlodipine    Peripheral vascular disease,  unspecified Continue aspirin  and statins   AAA  (abdominal aortic aneurysm, ruptured) (HCC) History of AAA - 2013 -Obtaining CT angiogram for further evaluation - Patient denies any chest pain Last echo 08/04/2022, EF 55-60%, normal LV function, no LVH, no structural abnormalities reported  09/08/24 -vascular US  AAA duplex-last vascular study of abdominal aorta: Common iliac graft noted-around 2.5 centimeter     DVT prophylaxis: Place and maintain sequential compression device Start: 11/21/24 0919 Place TED hose Start: 11/21/24 0919 SCDs Start: 11/21/24 0914  SCDs   Code Status: Full Code Family Communication:  2 sisters Disposition Plan: Home Reason for continuing need for hospitalization: IV steroids, frequent nebulizer treatments remains hypoxic requiring liters per minute oxygen  Objective: Vitals:   11/23/24 2006 11/23/24 2111 11/23/24 2348 11/24/24 0343  BP: 132/70  (!) 140/76 139/71  Pulse: 73  76 68  Resp: 20  20 20   Temp: 97.6 F (36.4 C)  97.6 F (36.4 C) 97.6 F (36.4 C)  TempSrc:      SpO2: 96% 99% 95% 95%  Weight:    119.5 kg  Height:        Intake/Output Summary (Last 24 hours) at 11/24/2024 0834 Last data filed at 11/23/2024 1800 Gross per 24 hour  Intake 480 ml  Output --  Net 480 ml   Filed Weights   11/22/24 0415 11/23/24 0343 11/24/24 0343  Weight: 120.8 kg 119.5 kg 119.5 kg    Examination:  Physical Exam Constitutional:      Appearance: He is well-developed. He is obese. He is ill-appearing.  Neck:     Vascular: No JVD.  Cardiovascular:     Rate and Rhythm: Normal rate and regular rhythm.  Pulmonary:     Effort: Tachypnea present.     Breath sounds: Examination of the right-upper field reveals decreased breath sounds and wheezing. Examination of the left-upper field reveals decreased breath sounds and wheezing. Examination of the right-middle field reveals decreased breath sounds and wheezing. Examination of the left-middle field reveals decreased breath sounds and wheezing. Examination of  the right-lower field reveals decreased breath sounds and wheezing. Examination of the left-lower field reveals decreased breath sounds and wheezing. Decreased breath sounds and wheezing present. No rhonchi or rales.  Musculoskeletal:     Right lower leg: Edema present.     Left lower leg: Edema present.  Skin:    General: Skin is warm and dry.  Neurological:     Mental Status: He is alert and oriented to person, place, and time.  Psychiatric:        Mood and Affect: Mood normal.     Data Reviewed: I have personally reviewed following labs and imaging studies  CBC: Recent Labs  Lab 11/21/24 0304 11/21/24 0324 11/21/24 0326  WBC 10.1  --   --   NEUTROABS 6.2  --   --   HGB 15.9 16.0 16.0  HCT 49.3 47.0 47.0  MCV 102.3*  --   --   PLT 277  --   --    Basic Metabolic Panel: Recent Labs  Lab 11/21/24 0304 11/21/24 0324 11/21/24 0326 11/21/24 2104 11/23/24 0417  NA 139 140 140  --  136  K 4.3 4.2 4.2  --  4.5  CL 102 101  --   --  99  CO2 28  --   --   --  26  GLUCOSE 107* 106*  --   --  138*  BUN 13 13  --   --  25*  CREATININE 0.92 0.90  --   --  0.93  CALCIUM  9.2  --   --   --  8.8*  MG  --   --   --  2.0  --   PHOS  --   --   --  2.6  --    GFR: Estimated Creatinine Clearance: 70.4 mL/min (by C-G formula based on SCr of 0.93 mg/dL). Liver Function Tests: Recent Labs  Lab 11/21/24 0304  AST 20  ALT 15  ALKPHOS 92  BILITOT 1.1  PROT 6.8  ALBUMIN  3.9   No results for input(s): LIPASE, AMYLASE in the last 168 hours. No results for input(s): AMMONIA in the last 168 hours. Coagulation Profile: Recent Labs  Lab 11/21/24 2104  INR 1.2   Cardiac Enzymes: No results for input(s): CKTOTAL, CKMB, CKMBINDEX, TROPONINI in the last 168 hours. ProBNP, BNP (last 5 results) Recent Labs    06/24/24 1530 11/21/24 0304  PROBNP  --  66.0  BNP 57.0  --    HbA1C: No results for input(s): HGBA1C in the last 72 hours. CBG: Recent Labs  Lab  11/22/24 1112 11/22/24 1736 11/23/24 0844  GLUCAP 148* 153* 147*   Lipid Profile: No results for input(s): CHOL, HDL, LDLCALC, TRIG, CHOLHDL, LDLDIRECT in the last 72 hours. Thyroid  Function Tests: No results for input(s): TSH, T4TOTAL, FREET4, T3FREE, THYROIDAB in the last 72 hours. Anemia Panel: No results for input(s): VITAMINB12, FOLATE, FERRITIN, TIBC, IRON, RETICCTPCT in the last 72 hours. Sepsis Labs: Recent Labs  Lab 11/21/24 0324 11/21/24 0517  LATICACIDVEN 1.7 1.4    Recent Results (from the past 240 hours)  Resp panel by RT-PCR (RSV, Flu A&B, Covid) Anterior Nasal Swab     Status: None   Collection Time: 11/21/24  3:00 AM   Specimen: Anterior Nasal Swab  Result Value Ref Range Status   SARS Coronavirus 2 by RT PCR NEGATIVE NEGATIVE Final   Influenza A by PCR NEGATIVE NEGATIVE Final   Influenza B by PCR NEGATIVE NEGATIVE Final    Comment: (NOTE) The Xpert Xpress SARS-CoV-2/FLU/RSV plus assay is intended as an aid in the diagnosis of influenza from Nasopharyngeal swab specimens and should not be used as a sole basis for treatment. Nasal washings and aspirates are unacceptable for Xpert Xpress SARS-CoV-2/FLU/RSV testing.  Fact Sheet for Patients: bloggercourse.com  Fact Sheet for Healthcare Providers: seriousbroker.it  This test is not yet approved or cleared by the [place]  FDA and has been authorized for detection and/or diagnosis of SARS-CoV-2 by FDA under an Emergency Use Authorization (EUA). This EUA will remain in effect (meaning this test can be used) for the duration of the COVID-19 declaration under Section 564(b)(1) of the Act, 21 U.S.C. section 360bbb-3(b)(1), unless the authorization is terminated or revoked.     Resp Syncytial Virus by PCR NEGATIVE NEGATIVE Final    Comment: (NOTE) Fact Sheet for Patients: bloggercourse.com  Fact  Sheet for Healthcare Providers: seriousbroker.it  This test is not yet approved or cleared by the [place]  FDA and has been authorized for detection and/or diagnosis of SARS-CoV-2 by FDA under an Emergency Use Authorization (EUA). This EUA will remain in effect (meaning this test can be used) for the duration of the COVID-19 declaration under Section 564(b)(1) of the Act, 21 U.S.C. section 360bbb-3(b)(1), unless the authorization is terminated or revoked.  Performed at [facility], [address] [place], [place] [address]   Respiratory (~20 pathogens) panel by PCR  Status: None   Collection Time: 11/21/24  2:14 PM   Specimen: Nasopharyngeal Swab; Respiratory  Result Value Ref Range Status   Adenovirus NOT DETECTED NOT DETECTED Final   Coronavirus 229E NOT DETECTED NOT DETECTED Final    Comment: (NOTE) The Coronavirus on the Respiratory Panel, DOES NOT test for the novel  Coronavirus (2019 nCoV)    Coronavirus HKU1 NOT DETECTED NOT DETECTED Final   Coronavirus NL63 NOT DETECTED NOT DETECTED Final   Coronavirus OC43 NOT DETECTED NOT DETECTED Final   Metapneumovirus NOT DETECTED NOT DETECTED Final   Rhinovirus / Enterovirus NOT DETECTED NOT DETECTED Final   Influenza A NOT DETECTED NOT DETECTED Final   Influenza B NOT DETECTED NOT DETECTED Final   Parainfluenza Virus 1 NOT DETECTED NOT DETECTED Final   Parainfluenza Virus 2 NOT DETECTED NOT DETECTED Final   Parainfluenza Virus 3 NOT DETECTED NOT DETECTED Final   Parainfluenza Virus 4 NOT DETECTED NOT DETECTED Final   Respiratory Syncytial Virus NOT DETECTED NOT DETECTED Final   Bordetella pertussis NOT DETECTED NOT DETECTED Final   Bordetella Parapertussis NOT DETECTED NOT DETECTED Final   Chlamydophila pneumoniae NOT DETECTED NOT DETECTED Final   Mycoplasma pneumoniae NOT DETECTED NOT DETECTED Final    Comment: Performed at [facility], [address] [place], [place] [address]      Radiology Studies: No results found.   Scheduled Meds:  ascorbic acid   500 mg Oral Daily   aspirin   81 mg Oral Daily   atorvastatin   20 mg Oral Daily   chlorpheniramine-HYDROcodone   5 mL Oral Q12H   citalopram   20 mg Oral Daily   guaiFENesin   10 mL Oral Q8H   irbesartan   150 mg Oral Daily   levalbuterol   1.25 mg Nebulization Q6H   methylPREDNISolone  (SOLU-MEDROL ) injection  40 mg Intravenous Q12H   senna-docusate  1 tablet Oral QHS   sodium chloride  flush  3 mL Intravenous Q12H   sodium chloride  flush  3 mL Intravenous Q12H   zinc  sulfate (50mg  elemental zinc )  220 mg Oral Daily   Continuous Infusions:  lactated ringers        LOS: 2 days   Time spent: 35 minutes  Lonni KANDICE Moose, MD  [facility]  11/24/2024, 8:34 AM   "

## 2024-11-25 DIAGNOSIS — J9601 Acute respiratory failure with hypoxia: Secondary | ICD-10-CM | POA: Diagnosis not present

## 2024-11-25 LAB — GLUCOSE, CAPILLARY: Glucose-Capillary: 139 mg/dL — ABNORMAL HIGH (ref 70–99)

## 2024-11-25 LAB — CBC
HCT: 43.5 % (ref 39.0–52.0)
Hemoglobin: 14.7 g/dL (ref 13.0–17.0)
MCH: 33.4 pg (ref 26.0–34.0)
MCHC: 33.8 g/dL (ref 30.0–36.0)
MCV: 98.9 fL (ref 80.0–100.0)
Platelets: 251 K/uL (ref 150–400)
RBC: 4.4 MIL/uL (ref 4.22–5.81)
RDW: 12.7 % (ref 11.5–15.5)
WBC: 16.7 K/uL — ABNORMAL HIGH (ref 4.0–10.5)
nRBC: 0 % (ref 0.0–0.2)

## 2024-11-25 MED ORDER — GUAIFENESIN ER 600 MG PO TB12
600.0000 mg | ORAL_TABLET | Freq: Two times a day (BID) | ORAL | Status: DC
  Administered 2024-11-25 – 2024-11-28 (×7): 600 mg via ORAL
  Filled 2024-11-25 (×7): qty 1

## 2024-11-25 MED ORDER — PREDNISONE 20 MG PO TABS: 20.0000 mg | ORAL_TABLET | Freq: Every day | ORAL | Status: DC

## 2024-11-25 MED ORDER — METHYLPREDNISOLONE SODIUM SUCC 40 MG IJ SOLR
40.0000 mg | Freq: Two times a day (BID) | INTRAMUSCULAR | Status: AC
  Administered 2024-11-25: 40 mg via INTRAVENOUS
  Filled 2024-11-25: qty 1

## 2024-11-25 MED ORDER — ALBUTEROL SULFATE (2.5 MG/3ML) 0.083% IN NEBU
2.5000 mg | INHALATION_SOLUTION | Freq: Four times a day (QID) | RESPIRATORY_TRACT | Status: DC
  Administered 2024-11-25 – 2024-11-26 (×2): 2.5 mg via RESPIRATORY_TRACT
  Filled 2024-11-25 (×3): qty 3

## 2024-11-25 MED ORDER — ARFORMOTEROL TARTRATE 15 MCG/2ML IN NEBU
15.0000 ug | INHALATION_SOLUTION | Freq: Two times a day (BID) | RESPIRATORY_TRACT | Status: DC
  Administered 2024-11-26 – 2024-11-27 (×4): 15 ug via RESPIRATORY_TRACT
  Filled 2024-11-25 (×6): qty 2

## 2024-11-25 MED ORDER — PREDNISONE 20 MG PO TABS: 30.0000 mg | ORAL_TABLET | Freq: Every day | ORAL | Status: DC

## 2024-11-25 MED ORDER — PREDNISONE 20 MG PO TABS
40.0000 mg | ORAL_TABLET | Freq: Every day | ORAL | Status: AC
  Administered 2024-11-26 – 2024-11-28 (×3): 40 mg via ORAL
  Filled 2024-11-25 (×3): qty 2

## 2024-11-25 MED ORDER — BUDESONIDE 0.25 MG/2ML IN SUSP
0.2500 mg | Freq: Two times a day (BID) | RESPIRATORY_TRACT | Status: DC
  Administered 2024-11-26 – 2024-11-27 (×4): 0.25 mg via RESPIRATORY_TRACT
  Filled 2024-11-25 (×6): qty 2

## 2024-11-25 MED ORDER — PREDNISONE 10 MG PO TABS: 10.0000 mg | ORAL_TABLET | Freq: Every day | ORAL | Status: DC

## 2024-11-25 NOTE — Plan of Care (Signed)

## 2024-11-25 NOTE — Progress Notes (Signed)
 [facility] Progress Note  Patient: Brandon [name]    FMW:[id number]  DOA: 11/21/2024     Date of Service: the patient was seen and examined on 11/25/2024  Chief Complaint  Patient presents with   Shortness of Breath   Brief hospital course: Brandon [name] is a 84 year-old male COPD, not O2 dependent, history of AAA with repair, HTN,HLD,  depression ... Presents to ED with chief complaint of shortness of breath.  Reported shortness of breath with sudden, woke him up from sleep.  Reports has been going on subtly for past 2 days associate with productive cough clear in nature.  Denies any chest pain or shortness of breath.  Denies any abdominal pain.  Denies any fever or chills.   ED Evaluation: Blood pressure 113/84, pulse 87, temperature 97.9 F (36.6 C), resp. rate 17, SpO2 95% on 3 L of oxygen On arrival heart rate 110, RR 30, LABs: CBC CMP within normal limits, glucose 106, lactic acid 1.4 Respiratory panel: Influenza A/B RSV, COVID all negative    Assessment and Plan:  # Acute hypoxic respiratory failure: currently on 2L of oxygen -Associated with cough, congestion, possibly upper respiratory infection viral -Current respiratory panel including influenza A, B, RSV, COVID all negative -Chest x-ray: No acute cardiopulmonary findings, stable COPD -Currently patient is afebrile, normotensive with no leukocytosis -will avoid antibiotics -Continue supportive therapy, DuoNeb bronchodilators, supplemental oxygen, vitamin C , zinc , Sinex, -Encouraging spirometer and flutter valve 1/11 started Mucinex  600 mg p.o. twice daily, Brovana  plus Pulmicort  nebs twice daily, albuterol  nebulizer Q6 hourly scheduled. Arrange oxygen for home use on discharge   - CT angiogram  -negative for acute PE No definite evidence of large central pulmonary embolus seen in the main pulmonary artery or main portions of the left and right pulmonary arteries. However, due to significant respiratory  motion artifact, 2. Extensive coronary artery calcifications are noted suggesting coronary artery disease. 3. Minimal bibasilar subsegmental atelectasis is noted. 4. Minimal cholelithiasis.     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) Mild signs of exacerbation requiring 3 L of oxygen, diffuse mild wheezing -s/p IV Solu-Medrol  40 mg every 12 hours, transition to oral prednisone  tapering dose -Scheduled and as needed DuoNeb bronchodilator -encouraging incentive spirometer, flutter valve - Not O2 dependent at baseline   Hyperlipidemia Continue statin   Subdural hematoma (HCC) Remote history noted   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   Atherosclerosis of native artery of extremity with intermittent claudication Currently stable denies any chest pain, continue statins, aspirin , amlodipine    Peripheral vascular disease, unspecified Continue aspirin  and statins   AAA (abdominal aortic aneurysm, ruptured) (HCC) History of AAA - 2013 -Obtaining CT angiogram for further evaluation - Patient denies any chest pain Last echo 08/04/2022, EF 55-60%, normal LV function, no LVH, no structural abnormalities reported  09/08/24 -vascular US  AAA duplex-last vascular study of abdominal aorta: Common iliac graft  noted-around 2.5 centimeter     Body mass  index is 48.63 kg/m.  Interventions:  Diet: Regular diet DVT Prophylaxis: Subcutaneous Lovenox    Advance goals of care discussion: Full code  Family Communication: family was present at bedside, at the time of interview.  The pt provided permission to discuss medical plan with the family. Opportunity was given to ask question and all questions were answered satisfactorily.   Disposition:  Pt is from Home, admitted with acute respiratory failure due to COPD exacerbation, still has shortness of breath and wheezing, which precludes a safe discharge. Discharge to Home, when stable, most likely tomorrow a.m.  Subjective: No significant events overnight.  Patient still having shortness of breath wheezing, cough with phlegm production.  Denied any chest pain or palpitation, no any other complaints.  Physical Exam: General: NAD, lying comfortably Appear in no distress, affect appropriate Eyes: PERRLA ENT: Oral Mucosa Clear, moist  Neck: no JVD,  Cardiovascular: S1 and S2 Present, no Murmur,  Respiratory: good air entry bilaterally, minimal crackles and moderate wheezes bilaterally  Abdomen: Bowel Sound present, Soft and no tenderness,  Skin: no rashes Extremities: no Pedal edema, no calf tenderness Neurologic: without any new focal findings Gait not checked due to patient safety concerns  Vitals:   11/25/24 0500 11/25/24 0731 11/25/24 0837 11/25/24 1148  BP:   (!) 122/98 134/72  Pulse:   70 69  Resp:   18 18  Temp:   98 F (36.7 C) 98.2 F (36.8 C)  TempSrc:   Oral Oral  SpO2:  93% 91% 92%  Weight: 118.7 kg     Height:       No intake or output data in the 24 hours ending 11/25/24 1456 Filed Weights   11/23/24 0343 11/24/24 0343 11/25/24 0500  Weight: 119.5 kg 119.5 kg 118.7 kg    Data Reviewed: I have personally reviewed and interpreted daily labs, tele strips, imagings as discussed above. I reviewed all nursing notes,  pharmacy notes, vitals, pertinent old records I have discussed plan of care as described above with RN and patient/family.  CBC: Recent Labs  Lab 11/21/24 0304 11/21/24 0324 11/21/24 0326 11/25/24 0446  WBC 10.1  --   --  16.7*  NEUTROABS 6.2  --   --   --   HGB 15.9 16.0 16.0 14.7  HCT 49.3 47.0 47.0 43.5  MCV 102.3*  --   --  98.9  PLT 277  --   --  251   Basic Metabolic Panel: Recent Labs  Lab 11/21/24 0304 11/21/24 0324 11/21/24 0326 11/21/24 2104 11/23/24 0417  NA 139 140 140  --  136  K 4.3 4.2 4.2  --  4.5  CL 102 101  --   --  99  CO2 28  --   --   --  26  GLUCOSE 107* 106*  --   --  138*  BUN 13 13  --   --  25*  CREATININE 0.92 0.90  --   --  0.93  CALCIUM  9.2  --   --   --  8.8*  MG  --   --   --  2.0  --   PHOS  --   --   --  2.6  --     Studies: No results found.  Scheduled Meds:  albuterol   2.5 mg Nebulization Q6H   arformoterol   15 mcg Nebulization BID   And   budesonide  (PULMICORT ) nebulizer solution  0.25 mg Nebulization BID   ascorbic acid   500 mg  Oral Daily   aspirin   81 mg Oral Daily   atorvastatin   20 mg Oral Daily   chlorpheniramine-HYDROcodone   5 mL Oral Q12H   citalopram   20 mg Oral Daily   enoxaparin  (LOVENOX ) injection  60 mg Subcutaneous Q24H   guaiFENesin   600 mg Oral BID   irbesartan   150 mg Oral Daily   methylPREDNISolone  (SOLU-MEDROL ) injection  40 mg Intravenous Q12H   pantoprazole   40 mg Oral Daily   [START ON 11/26/2024] predniSONE   40 mg Oral Q breakfast   Followed by   NOREEN ON 11/29/2024] predniSONE   30 mg Oral Q breakfast   Followed by   NOREEN ON 12/02/2024] predniSONE   20 mg Oral Q breakfast   Followed by   NOREEN ON 12/05/2024] predniSONE   10 mg Oral Q breakfast   senna-docusate  1 tablet Oral QHS   sodium chloride  flush  3 mL Intravenous Q12H   sodium chloride  flush  3 mL Intravenous Q12H   zinc  sulfate (50mg  elemental zinc )  220 mg Oral Daily   Continuous Infusions:  lactated ringers      PRN Meds:  acetaminophen  **OR** acetaminophen , alum & mag hydroxide-simeth, hydrALAZINE , HYDROmorphone  (DILAUDID ) injection, ipratropium, ondansetron  **OR** ondansetron  (ZOFRAN ) IV, oxyCODONE , traZODone   Time spent: 55 minutes  Author: ELVAN SOR. MD [facility] 11/25/2024 2:56 PM  To reach On-call, see care teams to locate the attending and reach out to them via [email or web address]. If 7PM-7AM, please contact night-coverage If you still have difficulty reaching the attending provider, please page the [facility] (Director on Call) for [facility] on amion for assistance.

## 2024-11-26 DIAGNOSIS — J9601 Acute respiratory failure with hypoxia: Secondary | ICD-10-CM | POA: Diagnosis not present

## 2024-11-26 LAB — CBC
HCT: 45 % (ref 39.0–52.0)
Hemoglobin: 15.1 g/dL (ref 13.0–17.0)
MCH: 33 pg (ref 26.0–34.0)
MCHC: 33.6 g/dL (ref 30.0–36.0)
MCV: 98.5 fL (ref 80.0–100.0)
Platelets: 255 K/uL (ref 150–400)
RBC: 4.57 MIL/uL (ref 4.22–5.81)
RDW: 12.7 % (ref 11.5–15.5)
WBC: 18 K/uL — ABNORMAL HIGH (ref 4.0–10.5)
nRBC: 0 % (ref 0.0–0.2)

## 2024-11-26 LAB — BASIC METABOLIC PANEL WITH GFR
Anion gap: 6 (ref 5–15)
BUN: 24 mg/dL — ABNORMAL HIGH (ref 8–23)
CO2: 31 mmol/L (ref 22–32)
Calcium: 8.5 mg/dL — ABNORMAL LOW (ref 8.9–10.3)
Chloride: 100 mmol/L (ref 98–111)
Creatinine, Ser: 0.92 mg/dL (ref 0.61–1.24)
GFR, Estimated: >60 mL/min
Glucose, Bld: 157 mg/dL — ABNORMAL HIGH (ref 70–99)
Potassium: 4.8 mmol/L (ref 3.5–5.1)
Sodium: 137 mmol/L (ref 135–145)

## 2024-11-26 LAB — GLUCOSE, CAPILLARY: Glucose-Capillary: 143 mg/dL — ABNORMAL HIGH (ref 70–99)

## 2024-11-26 LAB — PHOSPHORUS: Phosphorus: 2.4 mg/dL — ABNORMAL LOW (ref 2.5–4.6)

## 2024-11-26 LAB — MAGNESIUM: Magnesium: 2.3 mg/dL (ref 1.7–2.4)

## 2024-11-26 MED ORDER — K PHOS MONO-SOD PHOS DI & MONO 155-852-130 MG PO TABS
500.0000 mg | ORAL_TABLET | Freq: Four times a day (QID) | ORAL | Status: AC
  Administered 2024-11-26 (×2): 500 mg via ORAL
  Filled 2024-11-26 (×2): qty 2

## 2024-11-26 MED ORDER — ALBUTEROL SULFATE (2.5 MG/3ML) 0.083% IN NEBU
2.5000 mg | INHALATION_SOLUTION | Freq: Two times a day (BID) | RESPIRATORY_TRACT | Status: DC
  Administered 2024-11-26 – 2024-11-27 (×2): 2.5 mg via RESPIRATORY_TRACT
  Filled 2024-11-26 (×2): qty 3

## 2024-11-26 NOTE — Progress Notes (Signed)
 [facility] Progress Note  Patient: Brandon [name]    FMW:[id number]  DOA: 11/21/2024     Date of Service: the patient was seen and examined on 11/26/2024  Chief Complaint  Patient presents with   Shortness of Breath   Brief hospital course: COMER DEVINS is a 84 year-old male COPD, not O2 dependent, history of AAA with repair, HTN,HLD,  depression ... Presents to ED with chief complaint of shortness of breath.  Reported shortness of breath with sudden, woke him up from sleep.  Reports has been going on subtly for past 2 days associate with productive cough clear in nature.  Denies any chest pain or shortness of breath.  Denies any abdominal pain.  Denies any fever or chills.   ED Evaluation: Blood pressure 113/84, pulse 87, temperature 97.9 F (36.6 C), resp. rate 17, SpO2 95% on 3 L of oxygen On arrival heart rate 110, RR 30, LABs: CBC CMP within normal limits, glucose 106, lactic acid 1.4 Respiratory panel: Influenza A/B RSV, COVID all negative    Assessment and Plan:  # Acute hypoxic respiratory failure due to COPD exacerbation currently on 2L of oxygen -Associated with cough, congestion, possibly upper respiratory infection viral -Current respiratory panel including influenza A, B, RSV, COVID all negative -Chest x-ray: No acute cardiopulmonary findings, stable COPD -Currently patient is afebrile, normotensive with no leukocytosis -will avoid antibiotics -Continue supportive therapy, DuoNeb bronchodilators, supplemental oxygen, vitamin C , zinc , Sinex, -Encouraging spirometer and flutter valve 1/11 started Mucinex  600 mg p.o. twice daily, Brovana  plus Pulmicort  nebs twice daily, albuterol  nebulizer Q6 hourly scheduled. Arrange oxygen for home use on discharge 1/12 patient would like to go home without oxygen, we will continue to wean down gradually Use incentive spirometer and flutter valve   - CT angiogram  -negative for acute PE No definite evidence of large central  pulmonary embolus seen in the main pulmonary artery or main portions of the left and right pulmonary arteries. However, due to significant respiratory motion artifact, 2. Extensive coronary artery calcifications are noted suggesting coronary artery disease. 3. Minimal bibasilar subsegmental atelectasis is noted. 4. Minimal cholelithiasis.       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   Atherosclerosis of native artery of extremity with intermittent claudication Currently stable denies any chest pain, continue statins, aspirin , amlodipine    Peripheral vascular disease, unspecified Continue aspirin  and statins   AAA (abdominal aortic aneurysm, ruptured) (HCC) History of AAA - 2013 -Obtaining CT angiogram for further evaluation - Patient denies any chest pain Last echo 08/04/2022, EF 55-60%, normal LV function, no LVH, no structural abnormalities reported  09/08/24 -vascular US  AAA duplex-last vascular study of abdominal aorta: Common iliac graft noted-around 2.5 centimeter     Body mass index is 48.63 kg/m.  Interventions:  Diet:  Regular diet DVT Prophylaxis: Subcutaneous Lovenox    Advance goals of care discussion: Full code  Family Communication: family was present at bedside, at the time of interview.  The pt provided permission to discuss medical plan with the family. Opportunity was given to ask question and all questions were answered satisfactorily.   Disposition:  Pt is from Home, admitted with acute respiratory failure due to COPD exacerbation, still has shortness of breath and wheezing, which precludes a safe discharge. Discharge to Home, when stable, most likely tomorrow a.m.  Subjective: No significant events overnight.  Patient still having cough and phlegm production, shortness of breath.  Overall feels improvement.  Still requiring supplemental O2 and elation. Patient would like to stay until his oxygen is off, would prefer to go without oxygen at home.   Physical Exam: General: NAD, lying comfortably Appear in no distress, affect appropriate Eyes: PERRLA ENT: Oral Mucosa Clear, moist  Neck: no JVD,  Cardiovascular: S1 and S2 Present, no Murmur,  Respiratory: good air entry bilaterally, minimal crackles and moderate wheezes bilaterally  Abdomen: Bowel Sound present, Soft and no tenderness,  Skin: no rashes Extremities: no Pedal edema, no calf tenderness Neurologic: without any new focal findings Gait not checked due to patient safety concerns  Vitals:   11/26/24 0429 11/26/24 0500 11/26/24 0848 11/26/24 1206  BP: (!) 140/79   132/75  Pulse: 70  82 73  Resp: 20  18 20   Temp: 97.8 F (36.6 C)   (!) 97.4 F (36.3 C)  TempSrc:      SpO2: 96%  95% 94%  Weight:  118.7 kg    Height:        Intake/Output Summary (Last 24 hours) at 11/26/2024 1451 Last data filed at 11/26/2024 0941 Gross per 24 hour  Intake 9 ml  Output --  Net 9 ml   Filed Weights   11/24/24 0343 11/25/24 0500 11/26/24 0500  Weight: 119.5 kg 118.7 kg 118.7 kg    Data Reviewed: I have personally reviewed and  interpreted daily labs, tele strips, imagings as discussed above. I reviewed all nursing notes, pharmacy notes, vitals, pertinent old records I have discussed plan of care as described above with RN and patient/family.  CBC: Recent Labs  Lab 11/21/24 0304 11/21/24 0324 11/21/24 0326 11/25/24 0446 11/26/24 0424  WBC 10.1  --   --  16.7* 18.0*  NEUTROABS 6.2  --   --   --   --   HGB 15.9 16.0 16.0 14.7 15.1  HCT 49.3 47.0 47.0 43.5 45.0  MCV 102.3*  --   --  98.9 98.5  PLT 277  --   --  251 255   Basic Metabolic Panel: Recent Labs  Lab 11/21/24 0304 11/21/24 0324 11/21/24 0326 11/21/24 2104 11/23/24 0417 11/26/24 0424  NA 139 140 140  --  136 137  K 4.3 4.2 4.2  --  4.5 4.8  CL 102 101  --   --  99 100  CO2 28  --   --   --  26 31  GLUCOSE 107* 106*  --   --  138* 157*  BUN 13 13  --   --  25* 24*  CREATININE 0.92 0.90  --   --  0.93 0.92  CALCIUM  9.2  --   --   --  8.8* 8.5*  MG  --   --   --  2.0  --  2.3  PHOS  --   --   --  2.6  --  2.4*    Studies: No results found.  Scheduled Meds:  albuterol   2.5 mg Nebulization BID   arformoterol   15 mcg Nebulization BID   And   budesonide  (PULMICORT ) nebulizer solution  0.25 mg Nebulization BID   ascorbic acid   500 mg Oral Daily   aspirin   81 mg Oral Daily   atorvastatin   20 mg Oral Daily   chlorpheniramine-HYDROcodone   5 mL Oral Q12H   citalopram   20 mg Oral Daily   enoxaparin  (LOVENOX ) injection  60 mg Subcutaneous Q24H   guaiFENesin   600 mg Oral BID   irbesartan   150 mg Oral Daily   pantoprazole   40 mg Oral Daily   phosphorus  500 mg Oral QID   predniSONE   40 mg Oral Q breakfast   Followed by   NOREEN ON 11/29/2024] predniSONE   30 mg Oral Q breakfast   Followed by   NOREEN ON 12/02/2024] predniSONE   20 mg Oral Q breakfast   Followed by   NOREEN ON 12/05/2024] predniSONE   10 mg Oral Q breakfast   senna-docusate  1 tablet Oral QHS   sodium chloride  flush  3 mL Intravenous Q12H   sodium chloride  flush  3 mL  Intravenous Q12H   zinc  sulfate (50mg  elemental zinc )  220 mg Oral Daily   Continuous Infusions:  lactated ringers      PRN Meds: acetaminophen  **OR** acetaminophen , alum & mag hydroxide-simeth, hydrALAZINE , HYDROmorphone  (DILAUDID ) injection, ipratropium, ondansetron  **OR** ondansetron  (ZOFRAN ) IV, oxyCODONE , traZODone   Time spent: 40 minutes  Author: ELVAN SOR. MD [facility] 11/26/2024 2:51 PM  To reach On-call, see care teams to locate the attending and reach out to them via [email or web address]. If 7PM-7AM, please contact night-coverage If you still have difficulty reaching the attending provider, please page the [facility] (Director on Call) for [facility] on amion for assistance.

## 2024-11-26 NOTE — Plan of Care (Signed)

## 2024-11-26 NOTE — Plan of Care (Signed)
  Problem: Education: Goal: Knowledge of General Education information will improve Description: Including pain rating scale, medication(s)/side effects and non-pharmacologic comfort measures Outcome: Progressing   Problem: Health Behavior/Discharge Planning: Goal: Ability to manage health-related needs will improve Outcome: Progressing   Problem: Clinical Measurements: Goal: Ability to maintain clinical measurements within normal limits will improve Outcome: Progressing Goal: Will remain free from infection Outcome: Progressing Goal: Diagnostic test results will improve Outcome: Progressing Goal: Respiratory complications will improve Outcome: Progressing   Problem: Activity: Goal: Risk for activity intolerance will decrease Outcome: Progressing   Problem: Nutrition: Goal: Adequate nutrition will be maintained Outcome: Progressing   Problem: Coping: Goal: Level of anxiety will decrease Outcome: Progressing   Problem: Pain Managment: Goal: General experience of comfort will improve and/or be controlled Outcome: Progressing   Problem: Safety: Goal: Ability to remain free from injury will improve Outcome: Progressing   Problem: Skin Integrity: Goal: Risk for impaired skin integrity will decrease Outcome: Progressing

## 2024-11-27 DIAGNOSIS — J9601 Acute respiratory failure with hypoxia: Secondary | ICD-10-CM | POA: Diagnosis not present

## 2024-11-27 NOTE — Progress Notes (Signed)
 [facility] Progress Note  Patient: Brandon [name]    FMW:[id number]  DOA: 11/21/2024     Date of Service: the patient was seen and examined on 11/27/2024  Chief Complaint  Patient presents with   Shortness of Breath   Brief hospital course: Brandon [name] is a 84 year-old male COPD, not O2 dependent, history of AAA with repair, HTN,HLD,  depression ... Presents to ED with chief complaint of shortness of breath.  Reported shortness of breath with sudden, woke him up from sleep.  Reports has been going on subtly for past 2 days associate with productive cough clear in nature.  Denies any chest pain or shortness of breath.  Denies any abdominal pain.  Denies any fever or chills.   ED Evaluation: Blood pressure 113/84, pulse 87, temperature 97.9 F (36.6 C), resp. rate 17, SpO2 95% on 3 L of oxygen On arrival heart rate 110, RR 30, LABs: CBC CMP within normal limits, glucose 106, lactic acid 1.4 Respiratory panel: Influenza A/B RSV, COVID all negative    Assessment and Plan:  # Acute hypoxic respiratory failure due to COPD exacerbation currently on 2L of oxygen -Associated with cough, congestion, possibly upper respiratory infection viral -Current respiratory panel including influenza A, B, RSV, COVID all negative -Chest x-ray: No acute cardiopulmonary findings, stable COPD -Currently patient is afebrile, normotensive with no leukocytosis -will avoid antibiotics -Continue supportive therapy, DuoNeb bronchodilators, supplemental oxygen, vitamin C , zinc , Sinex, -Encouraging spirometer and flutter valve 1/11 started Mucinex  600 mg p.o. twice daily, Brovana  plus Pulmicort  nebs twice daily, albuterol  nebulizer Q6 hourly scheduled. Arrange oxygen for home use on discharge 1/13 patient would like to go home without oxygen, we will continue to wean down gradually Use incentive spirometer and flutter valve   - CT angiogram  -negative for acute PE No definite evidence of large central  pulmonary embolus seen in the main pulmonary artery or main portions of the left and right pulmonary arteries. However, due to significant respiratory motion artifact, 2. Extensive coronary artery calcifications are noted suggesting coronary artery disease. 3. Minimal bibasilar subsegmental atelectasis is noted. 4. Minimal cholelithiasis.       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   Atherosclerosis of native artery of extremity with intermittent claudication Currently stable denies any chest pain, continue statins, aspirin , amlodipine    Peripheral vascular disease, unspecified Continue aspirin  and statins   AAA (abdominal aortic aneurysm, ruptured) (HCC) History of AAA - 2013 -Obtaining CT angiogram for further evaluation - Patient denies any chest pain Last echo 08/04/2022, EF 55-60%, normal LV function, no LVH, no structural abnormalities reported  09/08/24 -vascular US  AAA duplex-last vascular study of abdominal aorta: Common iliac graft noted-around 2.5 centimeter     Body mass index is 48.63 kg/m.  Interventions:  Diet:  Regular diet DVT Prophylaxis: Subcutaneous Lovenox    Advance goals of care discussion: Full code  Family Communication: family was present at bedside, at the time of interview.  The pt provided permission to discuss medical plan with the family. Opportunity was given to ask question and all questions were answered satisfactorily.   Disposition:  Pt is from Home, admitted with acute respiratory failure due to COPD exacerbation, still has shortness of breath and wheezing, which precludes a safe discharge. Discharge to Home, when stable, most likely tomorrow a.m.  Subjective: No significant events overnight.  Patient is feeling improvement in the shortness of breath, still has mild cough and phlegm production.  Still using 2 L oxygen via nasal cannula. Patient wife would like to take him tomorrow without oxygen, so we will continue to wean off.    Physical Exam: General: NAD, lying comfortably Appear in no distress, affect appropriate Eyes: PERRLA ENT: Oral Mucosa Clear, moist  Neck: no JVD,  Cardiovascular: S1 and S2 Present, no Murmur,  Respiratory: good air entry bilaterally, minimal crackles and mild wheezes bilaterally, improved as compared to yesterday Abdomen: Bowel Sound present, Soft and no tenderness,  Skin: no rashes Extremities: no Pedal edema, no calf tenderness Neurologic: without any new focal findings Gait not checked due to patient safety concerns  Vitals:   11/27/24 0423 11/27/24 0724 11/27/24 0807 11/27/24 1157  BP: 115/69  (!) 119/55 132/64  Pulse: 72 63 67 77  Resp:  17 20 20   Temp: 97.9 F (36.6 C)  97.7 F (36.5 C) 97.8 F (36.6 C)  TempSrc:      SpO2: 96% 97% 96% 96%  Weight:      Height:        Intake/Output Summary (Last 24 hours) at 11/27/2024 1323 Last data filed at 11/26/2024 1956 Gross per 24 hour  Intake 240 ml  Output --  Net 240 ml   Filed Weights   11/24/24 0343 11/25/24 0500 11/26/24 0500  Weight: 119.5 kg 118.7 kg 118.7 kg     Data Reviewed: I have personally reviewed and interpreted daily labs, tele strips, imagings as discussed above. I reviewed all nursing notes, pharmacy notes, vitals, pertinent old records I have discussed plan of care as described above with RN and patient/family.  CBC: Recent Labs  Lab 11/21/24 0304 11/21/24 0324 11/21/24 0326 11/25/24 0446 11/26/24 0424  WBC 10.1  --   --  16.7* 18.0*  NEUTROABS 6.2  --   --   --   --   HGB 15.9 16.0 16.0 14.7 15.1  HCT 49.3 47.0 47.0 43.5 45.0  MCV 102.3*  --   --  98.9 98.5  PLT 277  --   --  251 255   Basic Metabolic Panel: Recent Labs  Lab 11/21/24 0304 11/21/24 0324 11/21/24 0326 11/21/24 2104 11/23/24 0417 11/26/24 0424  NA 139 140 140  --  136 137  K 4.3 4.2 4.2  --  4.5 4.8  CL 102 101  --   --  99 100  CO2 28  --   --   --  26 31  GLUCOSE 107* 106*  --   --  138* 157*  BUN 13 13  --   --  25* 24*  CREATININE 0.92 0.90  --   --  0.93 0.92  CALCIUM  9.2  --   --   --  8.8* 8.5*  MG  --   --   --  2.0  --  2.3  PHOS  --   --   --  2.6  --  2.4*    Studies: No results found.  Scheduled Meds:  arformoterol   15 mcg Nebulization BID   And   budesonide  (PULMICORT ) nebulizer solution  0.25 mg Nebulization BID   ascorbic acid   500 mg Oral Daily   aspirin   81 mg Oral Daily   atorvastatin   20 mg Oral Daily   chlorpheniramine-HYDROcodone   5 mL Oral Q12H   citalopram   20 mg Oral Daily   enoxaparin  (LOVENOX ) injection  60 mg Subcutaneous Q24H   guaiFENesin   600 mg Oral BID   irbesartan   150 mg Oral Daily   pantoprazole   40 mg Oral Daily   predniSONE   40 mg Oral Q breakfast   Followed by   NOREEN ON 11/29/2024] predniSONE   30 mg Oral Q breakfast   Followed by   NOREEN ON 12/02/2024] predniSONE   20 mg Oral Q breakfast   Followed by   NOREEN ON 12/05/2024] predniSONE   10 mg Oral Q breakfast   senna-docusate  1 tablet Oral QHS   sodium chloride  flush  3 mL Intravenous Q12H   sodium chloride  flush  3 mL Intravenous Q12H    zinc  sulfate (50mg  elemental zinc )  220 mg Oral Daily   Continuous Infusions:  lactated ringers      PRN Meds: acetaminophen  **OR** acetaminophen , alum & mag hydroxide-simeth, hydrALAZINE , HYDROmorphone  (DILAUDID ) injection, ipratropium, ondansetron  **OR** ondansetron  (ZOFRAN ) IV, oxyCODONE , traZODone   Time spent: 40 minutes  Author: ELVAN SOR. MD [facility] 11/27/2024 1:23 PM  To reach On-call, see care teams to locate the attending and reach out to them via [email or web address]. If 7PM-7AM, please contact night-coverage If you still have difficulty reaching the attending provider, please page the [facility] (Director on Call) for [facility] on amion for assistance.

## 2024-11-27 NOTE — Plan of Care (Signed)

## 2024-11-27 NOTE — Care Management Important Message (Signed)
 Important Message  Patient Details  Name: Brandon [name] MRN: [id number] Date of Birth: Mar 27, 1941   Important Message Given:  Yes - Medicare IM     Claretta Deed 11/27/2024, 2:35 PM

## 2024-11-27 NOTE — Progress Notes (Signed)
 SATURATION QUALIFICATIONS: (This note is used to comply with regulatory documentation for home oxygen)  Patient Saturations on Room Air at rest =  92%  Patient Saturations on Room Air while ambulating = 91%  Patient Saturations on  __ Liters of oxygen while ambulating = (Not performed)  Please briefly explain why patient needs home oxygen: No need for home oxygen as pt rest and ambulates on room air without dipping below 90%

## 2024-11-28 ENCOUNTER — Other Ambulatory Visit (HOSPITAL_COMMUNITY): Payer: Self-pay

## 2024-11-28 LAB — BASIC METABOLIC PANEL WITH GFR
Anion gap: 7 (ref 5–15)
BUN: 28 mg/dL — ABNORMAL HIGH (ref 8–23)
CO2: 29 mmol/L (ref 22–32)
Calcium: 8.1 mg/dL — ABNORMAL LOW (ref 8.9–10.3)
Chloride: 101 mmol/L (ref 98–111)
Creatinine, Ser: 0.76 mg/dL (ref 0.61–1.24)
GFR, Estimated: >60 mL/min
Glucose, Bld: 148 mg/dL — ABNORMAL HIGH (ref 70–99)
Potassium: 4.2 mmol/L (ref 3.5–5.1)
Sodium: 137 mmol/L (ref 135–145)

## 2024-11-28 LAB — CBC
HCT: 44.7 % (ref 39.0–52.0)
Hemoglobin: 14.9 g/dL (ref 13.0–17.0)
MCH: 32.7 pg (ref 26.0–34.0)
MCHC: 33.3 g/dL (ref 30.0–36.0)
MCV: 98.2 fL (ref 80.0–100.0)
Platelets: 269 K/uL (ref 150–400)
RBC: 4.55 MIL/uL (ref 4.22–5.81)
RDW: 12.9 % (ref 11.5–15.5)
WBC: 16.3 K/uL — ABNORMAL HIGH (ref 4.0–10.5)
nRBC: 0 % (ref 0.0–0.2)

## 2024-11-28 LAB — PHOSPHORUS: Phosphorus: 2.8 mg/dL (ref 2.5–4.6)

## 2024-11-28 LAB — MAGNESIUM: Magnesium: 2.3 mg/dL (ref 1.7–2.4)

## 2024-11-28 MED ORDER — GUAIFENESIN ER 600 MG PO TB12
600.0000 mg | ORAL_TABLET | Freq: Two times a day (BID) | ORAL | 0 refills | Status: AC
  Filled 2024-11-28: qty 20, 10d supply, fill #0

## 2024-11-28 MED ORDER — GUAIFENESIN 100 MG/5ML PO LIQD
5.0000 mL | Freq: Four times a day (QID) | ORAL | Status: DC | PRN
  Administered 2024-11-28: 5 mL via ORAL
  Filled 2024-11-28: qty 15

## 2024-11-28 MED ORDER — PREDNISONE 10 MG PO TABS
ORAL_TABLET | ORAL | 0 refills | Status: AC
  Filled 2024-11-28: qty 18, 9d supply, fill #0

## 2024-11-28 MED ORDER — IPRATROPIUM-ALBUTEROL 0.5-2.5 (3) MG/3ML IN SOLN
3.0000 mL | Freq: Four times a day (QID) | RESPIRATORY_TRACT | 0 refills | Status: AC | PRN
  Filled 2024-11-28: qty 360, 30d supply, fill #0

## 2024-11-28 NOTE — Discharge Summary (Signed)
 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 *  Hospital Course:  Brandon [name] is a 84 year-old male COPD, not O2 dependent, history of AAA with repair, HTN,HLD,  depression ... Presents to ED with chief complaint of shortness of breath.  Reported shortness of breath with sudden, woke him up from sleep.  Reports has been going on subtly for past 2 days associate with productive cough clear in nature.  Denies any chest pain or shortness of breath.  Denies any abdominal pain.  Denies any fever or chills.  ED Evaluation: Blood pressure 113/84, pulse 87, temperature 97.9 F (36.6 C), resp. rate 17, SpO2 95% on 3 L of oxygen On arrival heart rate 110, RR 30, LABs: CBC CMP within normal limits, glucose 106, lactic acid 1.4 Respiratory panel: Influenza A/B RSV, COVID all negative   Assessment and Plan:\  * Acute hypoxic respiratory failure (HCC) -Resolved; oxygen weaned off to room air -Associated with cough, congestion, possibly upper respiratory infection viral -Current respiratory panel including influenza A, B, RSV, COVID all negative -Chest x-ray:  No acute cardiopulmonary findings, stable COPD - Will discharge home on prednisone  taper  - CT angiogram  -negative for acute PE No definite evidence of large central pulmonary embolus seen in the main pulmonary artery or main portions of the left and right pulmonary arteries. However, due to significant respiratory motion artifact, 2. Extensive coronary artery calcifications are noted suggesting coronary artery disease. 3. Minimal bibasilar subsegmental atelectasis is noted. 4. Minimal cholelithiasis.      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  Atherosclerosis of native artery of extremity with intermittent claudication Currently stable denies any chest pain, continue statins, aspirin , amlodipine   Peripheral vascular disease, unspecified Continue aspirin  and statins  AAA (abdominal aortic aneurysm, ruptured) (HCC) History of AAA - 2013 - Patient denies any chest pain Last echo 08/04/2022, EF 55-60%, normal LV function, no LVH, no structural abnormalities reported  09/08/24 -vascular US  AAA duplex-last vascular study of abdominal aorta: Common iliac graft noted-around 2.5  centimeter           Consultants: None Procedures performed: None Disposition: Home Diet recommendation:  Regular diet DISCHARGE MEDICATION: Allergies as of 11/28/2024       Reactions   Umeclidinium-vilanterol Other (See Comments)   Unknown reaction - patient not aware of being allergic to this        Medication List     STOP taking these medications    azithromycin  250 MG tablet Commonly known as: Zithromax  Z-Pak       TAKE these medications    Aerochamber Plus Device Use as instructed   albuterol  108 (90 Base) MCG/ACT inhaler Commonly known as: VENTOLIN  HFA Inhale 2 puffs into the lungs every 4 (four) hours as needed for wheezing or shortness of breath. What changed:  when to take this Another medication with the same name was removed. Continue taking this medication, and follow the directions you see here.   aspirin  81 MG chewable tablet Chew 81 mg by mouth daily.   atorvastatin  10 MG tablet Commonly known as: LIPITOR Take 10 mg by mouth daily.   Breztri  Aerosphere 160-9-4.8 MCG/ACT Aero inhaler Generic drug: budesonide -glycopyrrolate-formoterol Inhale 2 puffs into the lungs in the morning and at bedtime.   citalopram  20 MG tablet Commonly known as: CELEXA  TAKE 1 TABLET BY MOUTH EVERY DAY   guaiFENesin  600 MG 12 hr tablet Commonly known as: Mucinex  Take 1 tablet (600 mg total) by mouth 2 (two) times daily for 10 days.   ipratropium-albuterol  0.5-2.5 (3) MG/3ML Soln Commonly known as: DUONEB Take 3 mLs by nebulization every 6 (six) hours as needed (Shortness of breath).   irbesartan  150 MG tablet Commonly known as: AVAPRO  Take 150 mg by mouth daily.   omeprazole  20 MG capsule Commonly known as: PRILOSEC Take 20 mg by mouth daily.   predniSONE  10 MG tablet Commonly known as: DELTASONE  Take 3 tablets (30 mg total) by mouth daily with breakfast for 3 days, THEN 2 tablets (20 mg total) daily with breakfast for 3 days, THEN 1 tablet (10 mg total) daily  with breakfast for 3 days. Start taking on: November 29, 2024 What changed:  medication strength See the new instructions.               Durable Medical Equipment  (From admission, onward)           Start     Ordered   11/25/24 1241  For home use only DME oxygen  Once       Question Answer Comment  Length of Need 6 Months   Liters per Minute 2   Frequency Continuous (stationary and portable oxygen unit needed)   Oxygen conserving device Yes   Oxygen delivery system: Gas   Oxygen delivery system: Concentrator      11/25/24 1240   11/22/24 1405  For home use only DME Cane  Once        11/22/24 1404            Follow-up Information     [facility] Follow up.   Why: [employer] will deliver a Cane to the bedside prior to patient discharging to home. Contact information: [address] [place] [address] [phone]         [facility] Follow up.   Specialty: Rehabilitation Why: Please follow up with the OP Therapy department regarding Outpatient therapy appointments. Contact information: [address] [place]  [phone]  Seabron Lenis, MD Follow up in 2 week(s).   Specialty: Family Medicine Contact information: [address], [address] [name] [address] [phone]                Discharge Exam: Fredricka Weights   11/25/24 0500 11/26/24 0500 11/28/24 0500  Weight: 118.7 kg 118.7 kg 120.9 kg   General-appears in no acute distress Heart-S1-S2, regular, no murmur auscultated Lungs-clear to auscultation bilaterally, no wheezing or crackles auscultated Abdomen-soft, nontender, no organomegaly Extremities-no edema in the lower extremities Neuro-alert, oriented x3, no focal deficit noted  Condition at discharge: good  The results of significant diagnostics from this hospitalization (including imaging, microbiology, ancillary and  laboratory) are listed below for reference.   Imaging Studies: CT Angio Chest Pulmonary Embolism (PE) W or WO Contrast Result Date: 11/21/2024 CLINICAL DATA:  Chronic dyspnea EXAM: CT ANGIOGRAPHY CHEST WITH CONTRAST TECHNIQUE: Multidetector CT imaging of the chest was performed using the standard protocol during bolus administration of intravenous contrast. Multiplanar CT image reconstructions and MIPs were obtained to evaluate the vascular anatomy. RADIATION DOSE REDUCTION: This exam was performed according to the departmental dose-optimization program which includes automated exposure control, adjustment of the mA and/or kV according to patient size and/or use of iterative reconstruction technique. CONTRAST:  75mL OMNIPAQUE  IOHEXOL  350 MG/ML SOLN COMPARISON:  June 24, 2024 FINDINGS: Cardiovascular: Normal cardiac size. No pericardial effusion. Extensive coronary artery calcifications are noted. Aortic atherosclerosis without aneurysm formation. No definite evidence of large central pulmonary embolus seen in the main pulmonary artery or main portions of the left and right pulmonary arteries. However, due to significant respiratory motion artifact, the lower lobe branches of both pulmonary arteries are not well visualized, and therefore smaller and more peripheral pulmonary emboli cannot be excluded on the basis of this exam. Mediastinum/Nodes: No enlarged mediastinal, hilar, or axillary lymph nodes. Thyroid  gland, trachea, and esophagus demonstrate no significant findings. Lungs/Pleura: No pneumothorax or pleural effusion is noted. Mild emphysematous disease is noted. Minimal bibasilar subsegmental atelectasis is noted. Upper Abdomen: Minimal cholelithiasis. Musculoskeletal: Old healed sternal fracture is noted. No acute osseous abnormality is noted. Review of the MIP images confirms the above findings. IMPRESSION: 1. No definite evidence of large central pulmonary embolus seen in the main pulmonary artery or  main portions of the left and right pulmonary arteries. However, due to significant respiratory motion artifact, the lower lobe branches of both pulmonary arteries are not well visualized, and therefore smaller and more peripheral pulmonary emboli cannot be excluded on the basis of this exam. 2. Extensive coronary artery calcifications are noted suggesting coronary artery disease. 3. Minimal bibasilar subsegmental atelectasis is noted. 4. Minimal cholelithiasis. Aortic Atherosclerosis (ICD10-I70.0) and Emphysema (ICD10-J43.9). Electronically Signed   By: Lynwood Landy Raddle M.D.   On: 11/21/2024 12:31   DG Chest Port 1 View Result Date: 11/21/2024 EXAM: 1 VIEW(S) XRAY OF THE CHEST 11/21/2024 03:25:00 AM COMPARISON: PA and lateral chest 10/17/2024. CLINICAL HISTORY: sob sob FINDINGS: LINES, TUBES AND DEVICES: Telemetry leads overlying the chest. LUNGS AND PLEURA: The lungs are mildly emphysematous. No focal pulmonary opacity. No pleural effusion. No pneumothorax. No vascular congestion is seen. HEART AND MEDIASTINUM: The heart is slightly enlarged. There is mild aortic tortuosity and atherosclerosis with a stable mediastinum. BONES AND SOFT TISSUES: Osteopenia and thoracic spondylosis. IMPRESSION: 1. No acute findings. Stable COPD chest . 2. Slightly enlarged heart without vascular congestion. Electronically signed by: Francis Quam MD 11/21/2024 03:44 AM EST RP Workstation: [id number]    Microbiology:  Results for orders placed or performed during the hospital encounter of 11/21/24  Resp panel by RT-PCR (RSV, Flu A&B, Covid) Anterior Nasal Swab     Status: None   Collection Time: 11/21/24  3:00 AM   Specimen: Anterior Nasal Swab  Result Value Ref Range Status   SARS Coronavirus 2 by RT PCR NEGATIVE NEGATIVE Final   Influenza A by PCR NEGATIVE NEGATIVE Final   Influenza B by PCR NEGATIVE NEGATIVE Final    Comment: (NOTE) The Xpert Xpress SARS-CoV-2/FLU/RSV plus assay is intended as an aid in the diagnosis  of influenza from Nasopharyngeal swab specimens and should not be used as a sole basis for treatment. Nasal washings and aspirates are unacceptable for Xpert Xpress SARS-CoV-2/FLU/RSV testing.  Fact Sheet for Patients: bloggercourse.com  Fact Sheet for Healthcare Providers: seriousbroker.it  This test is not yet approved or cleared by the [place]  FDA and has been authorized for detection and/or diagnosis of SARS-CoV-2 by FDA under an Emergency Use Authorization (EUA). This EUA will remain in effect (meaning this test can be used) for the duration of the COVID-19 declaration under Section 564(b)(1) of the Act, 21 U.S.C. section 360bbb-3(b)(1), unless the authorization is terminated or revoked.     Resp Syncytial Virus by PCR NEGATIVE NEGATIVE Final    Comment: (NOTE) Fact Sheet for Patients: bloggercourse.com  Fact Sheet for Healthcare Providers: seriousbroker.it  This test is not yet approved or cleared by the [place]  FDA and has been authorized for detection and/or diagnosis of SARS-CoV-2 by FDA under an Emergency Use Authorization (EUA). This EUA will remain in effect (meaning this test can be used) for the duration of the COVID-19 declaration under Section 564(b)(1) of the Act, 21 U.S.C. section 360bbb-3(b)(1), unless the authorization is terminated or revoked.  Performed at [facility], [address], [place], [place] [address]   Respiratory (~20 pathogens) panel by PCR     Status: None   Collection Time: 11/21/24  2:14 PM   Specimen: Nasopharyngeal Swab; Respiratory  Result Value Ref Range Status   Adenovirus NOT DETECTED NOT DETECTED Final   Coronavirus 229E NOT DETECTED NOT DETECTED Final    Comment: (NOTE) The Coronavirus on the Respiratory Panel, DOES NOT test for the novel  Coronavirus (2019 nCoV)    Coronavirus HKU1 NOT DETECTED NOT DETECTED  Final   Coronavirus NL63 NOT DETECTED NOT DETECTED Final   Coronavirus OC43 NOT DETECTED NOT DETECTED Final   Metapneumovirus NOT DETECTED NOT DETECTED Final   Rhinovirus / Enterovirus NOT DETECTED NOT DETECTED Final   Influenza A NOT DETECTED NOT DETECTED Final   Influenza B NOT DETECTED NOT DETECTED Final   Parainfluenza Virus 1 NOT DETECTED NOT DETECTED Final   Parainfluenza Virus 2 NOT DETECTED NOT DETECTED Final   Parainfluenza Virus 3 NOT DETECTED NOT DETECTED Final   Parainfluenza Virus 4 NOT DETECTED NOT DETECTED Final   Respiratory Syncytial Virus NOT DETECTED NOT DETECTED Final   Bordetella pertussis NOT DETECTED NOT DETECTED Final   Bordetella Parapertussis NOT DETECTED NOT DETECTED Final   Chlamydophila pneumoniae NOT DETECTED NOT DETECTED Final   Mycoplasma pneumoniae NOT DETECTED NOT DETECTED Final    Comment: Performed at [facility], [address] [place], [place] [address]    Labs: CBC: Recent Labs  Lab 11/25/24 0446 11/26/24 0424 11/28/24 0204  WBC 16.7* 18.0* 16.3*  HGB 14.7 15.1 14.9  HCT 43.5 45.0 44.7  MCV 98.9 98.5 98.2  PLT 251 255 269  Basic Metabolic Panel: Recent Labs  Lab 11/21/24 2104 11/23/24 0417 11/26/24 0424 11/28/24 0204  NA  --  136 137 137  K  --  4.5 4.8 4.2  CL  --  99 100 101  CO2  --  26 31 29   GLUCOSE  --  138* 157* 148*  BUN  --  25* 24* 28*  CREATININE  --  0.93 0.92 0.76  CALCIUM   --  8.8* 8.5* 8.1*  MG 2.0  --  2.3 2.3  PHOS 2.6  --  2.4* 2.8   Liver Function Tests: No results for input(s): AST, ALT, ALKPHOS, BILITOT, PROT, ALBUMIN  in the last 168 hours. CBG: Recent Labs  Lab 11/22/24 1736 11/23/24 0844 11/24/24 0842 11/25/24 0838 11/26/24 0849  GLUCAP 153* 147* 139* 139* 143*    Discharge time spent: greater than 30 minutes.  Signed: Sabas GORMAN Brod, MD [facility] 11/28/2024 "

## 2024-11-28 NOTE — Plan of Care (Signed)

## 2024-11-28 NOTE — TOC Transition Note (Signed)
 Transition of Care [facility]) - Discharge Note   Patient Details  Name: Brandon [name] MRN: [id number] Date of Birth: 11-10-1941  [facility] CM/SW Contact:  Rosaline JONELLE Joe, RN Phone Number: 11/28/2024, 1:55 PM   Clinical Narrative:    CM met with the patient at the bedside alone with the patient's sister and updated the sister regarding patient's walk test.  Patient does not qualify for oxygen for home.  I asked that the sister call and schedule a hospital follow up with PCP and pulmonary MD.  Patient has nebulizer machine at home that he uses.  Patient states that he quit smoking 13 years ago.  MD sent medications to [facility] pharmacy for discharge.         Patient Goals and CMS Choice            Discharge Placement                       Discharge Plan and Services Additional resources added to the After Visit Summary for                                       Social Drivers of Health (SDOH) Interventions SDOH Screenings   Food Insecurity: No Food Insecurity (11/21/2024)  Housing: Low Risk (11/21/2024)  Transportation Needs: No Transportation Needs (11/21/2024)  Utilities: Not At Risk (11/21/2024)  Depression (PHQ2-9): Low Risk (09/22/2022)  Social Connections: Moderately Isolated (11/21/2024)  Tobacco Use: Medium Risk (11/21/2024)     Readmission Risk Interventions    11/22/2024    2:11 PM  Readmission Risk Prevention Plan  Post Dischage Appt Complete  Medication Screening Complete  Transportation Screening Complete

## 2024-11-28 NOTE — Progress Notes (Signed)
SATURATION QUALIFICATIONS: (This note is used to comply with regulatory documentation for home oxygen) ? ?Patient Saturations on Room Air at Rest = 93% ? ?Patient Saturations on Room Air while Ambulating = 90% ? ?Patient Saturations on 0 Liters of oxygen while Ambulating = 90% ? ?Please briefly explain why patient needs home oxygen: ?

## 2024-11-28 NOTE — Progress Notes (Signed)
 AVS discharge instructions discussed with patient and sister at bedside. Patient verbalized understanding of all teaching. No questions per patient nor sister. PIV removed. TOC pharmacy filling meds to be picked up.

## 2024-12-06 ENCOUNTER — Inpatient Hospital Stay (HOSPITAL_COMMUNITY)
Admission: EM | Admit: 2024-12-06 | Source: Home / Self Care | Attending: Internal Medicine | Admitting: Internal Medicine

## 2024-12-06 ENCOUNTER — Emergency Department (HOSPITAL_COMMUNITY)

## 2024-12-06 ENCOUNTER — Other Ambulatory Visit: Payer: Self-pay

## 2024-12-06 ENCOUNTER — Encounter (HOSPITAL_COMMUNITY): Payer: Self-pay | Admitting: Emergency Medicine

## 2024-12-06 DIAGNOSIS — J9601 Acute respiratory failure with hypoxia: Secondary | ICD-10-CM | POA: Diagnosis not present

## 2024-12-06 DIAGNOSIS — I10 Essential (primary) hypertension: Secondary | ICD-10-CM | POA: Diagnosis present

## 2024-12-06 DIAGNOSIS — J441 Chronic obstructive pulmonary disease with (acute) exacerbation: Principal | ICD-10-CM

## 2024-12-06 DIAGNOSIS — E785 Hyperlipidemia, unspecified: Secondary | ICD-10-CM | POA: Diagnosis present

## 2024-12-06 DIAGNOSIS — I629 Nontraumatic intracranial hemorrhage, unspecified: Secondary | ICD-10-CM

## 2024-12-06 DIAGNOSIS — R0609 Other forms of dyspnea: Secondary | ICD-10-CM | POA: Diagnosis present

## 2024-12-06 DIAGNOSIS — J449 Chronic obstructive pulmonary disease, unspecified: Secondary | ICD-10-CM | POA: Diagnosis present

## 2024-12-06 DIAGNOSIS — I5031 Acute diastolic (congestive) heart failure: Secondary | ICD-10-CM | POA: Diagnosis not present

## 2024-12-06 DIAGNOSIS — J189 Pneumonia, unspecified organism: Secondary | ICD-10-CM

## 2024-12-06 DIAGNOSIS — I713 Abdominal aortic aneurysm, ruptured, unspecified: Secondary | ICD-10-CM | POA: Diagnosis present

## 2024-12-06 LAB — BASIC METABOLIC PANEL WITH GFR
Anion gap: 10 (ref 5–15)
BUN: 14 mg/dL (ref 8–23)
CO2: 25 mmol/L (ref 22–32)
Calcium: 8.2 mg/dL — ABNORMAL LOW (ref 8.9–10.3)
Chloride: 100 mmol/L (ref 98–111)
Creatinine, Ser: 0.73 mg/dL (ref 0.61–1.24)
GFR, Estimated: >60 mL/min
Glucose, Bld: 109 mg/dL — ABNORMAL HIGH (ref 70–99)
Potassium: 4 mmol/L (ref 3.5–5.1)
Sodium: 134 mmol/L — ABNORMAL LOW (ref 135–145)

## 2024-12-06 LAB — I-STAT CG4 LACTIC ACID, ED: Lactic Acid, Venous: 3.1 mmol/L (ref 0.5–1.9)

## 2024-12-06 LAB — CBC
HCT: 43.8 % (ref 39.0–52.0)
Hemoglobin: 15 g/dL (ref 13.0–17.0)
MCH: 33.6 pg (ref 26.0–34.0)
MCHC: 34.2 g/dL (ref 30.0–36.0)
MCV: 98.2 fL (ref 80.0–100.0)
Platelets: 233 K/uL (ref 150–400)
RBC: 4.46 MIL/uL (ref 4.22–5.81)
RDW: 12.9 % (ref 11.5–15.5)
WBC: 18.2 K/uL — ABNORMAL HIGH (ref 4.0–10.5)
nRBC: 0 % (ref 0.0–0.2)

## 2024-12-06 LAB — RESPIRATORY PANEL BY PCR

## 2024-12-06 LAB — MAGNESIUM: Magnesium: 2.1 mg/dL (ref 1.7–2.4)

## 2024-12-06 LAB — TROPONIN T, HIGH SENSITIVITY
Troponin T High Sensitivity: 11 ng/L (ref 0–19)
Troponin T High Sensitivity: 13 ng/L (ref 0–19)

## 2024-12-06 LAB — PROCALCITONIN: Procalcitonin: <0.1 ng/mL

## 2024-12-06 LAB — MRSA NEXT GEN BY PCR, NASAL: MRSA by PCR Next Gen: NOT DETECTED

## 2024-12-06 LAB — LACTIC ACID, PLASMA: Lactic Acid, Venous: 4 mmol/L (ref 0.5–1.9)

## 2024-12-06 LAB — PRO BRAIN NATRIURETIC PEPTIDE
Pro Brain Natriuretic Peptide: 136 pg/mL
Pro Brain Natriuretic Peptide: 150 pg/mL

## 2024-12-06 LAB — PHOSPHORUS: Phosphorus: 2.8 mg/dL (ref 2.5–4.6)

## 2024-12-06 MED ORDER — ATORVASTATIN CALCIUM 10 MG PO TABS
10.0000 mg | ORAL_TABLET | Freq: Every day | ORAL | Status: DC
  Administered 2024-12-07: 10 mg via ORAL
  Filled 2024-12-06: qty 1

## 2024-12-06 MED ORDER — ONDANSETRON HCL 4 MG PO TABS: 4.0000 mg | ORAL_TABLET | Freq: Four times a day (QID) | ORAL | Status: AC | PRN

## 2024-12-06 MED ORDER — BISACODYL 5 MG PO TBEC: 5.0000 mg | DELAYED_RELEASE_TABLET | Freq: Every day | ORAL | Status: AC | PRN

## 2024-12-06 MED ORDER — ACETAMINOPHEN 325 MG PO TABS: 650.0000 mg | ORAL_TABLET | Freq: Four times a day (QID) | ORAL | Status: AC | PRN

## 2024-12-06 MED ORDER — SODIUM CHLORIDE 0.9 % IV SOLN
1.0000 g | Freq: Once | INTRAVENOUS | Status: AC
  Administered 2024-12-06: 1 g via INTRAVENOUS
  Filled 2024-12-06: qty 10

## 2024-12-06 MED ORDER — METHYLPREDNISOLONE SODIUM SUCC 40 MG IJ SOLR
40.0000 mg | Freq: Two times a day (BID) | INTRAMUSCULAR | Status: DC
  Administered 2024-12-07 – 2024-12-17 (×21): 40 mg via INTRAVENOUS
  Filled 2024-12-06 (×21): qty 1

## 2024-12-06 MED ORDER — BUDESONIDE 0.25 MG/2ML IN SUSP
0.2500 mg | Freq: Two times a day (BID) | RESPIRATORY_TRACT | Status: DC
  Administered 2024-12-06 – 2024-12-15 (×17): 0.25 mg via RESPIRATORY_TRACT
  Filled 2024-12-06 (×18): qty 2

## 2024-12-06 MED ORDER — SODIUM CHLORIDE 0.9 % IV SOLN
500.0000 mg | Freq: Once | INTRAVENOUS | Status: AC
  Administered 2024-12-06: 500 mg via INTRAVENOUS
  Filled 2024-12-06: qty 5

## 2024-12-06 MED ORDER — OXYCODONE HCL 5 MG PO TABS: 5.0000 mg | ORAL_TABLET | ORAL | Status: DC | PRN

## 2024-12-06 MED ORDER — FLORANEX PO PACK
1.0000 g | PACK | Freq: Three times a day (TID) | ORAL | Status: DC
  Administered 2024-12-06 – 2024-12-09 (×10): 1 g via ORAL
  Filled 2024-12-06 (×13): qty 1

## 2024-12-06 MED ORDER — SODIUM CHLORIDE 0.9% FLUSH
3.0000 mL | Freq: Two times a day (BID) | INTRAVENOUS | Status: AC
  Administered 2024-12-06 – 2024-12-21 (×32): 3 mL via INTRAVENOUS

## 2024-12-06 MED ORDER — ACETAMINOPHEN 650 MG RE SUPP: 650.0000 mg | Freq: Four times a day (QID) | RECTAL | Status: AC | PRN

## 2024-12-06 MED ORDER — ASPIRIN 81 MG PO CHEW
81.0000 mg | CHEWABLE_TABLET | Freq: Every day | ORAL | Status: AC
  Administered 2024-12-07 – 2024-12-21 (×15): 81 mg via ORAL
  Filled 2024-12-06 (×15): qty 1

## 2024-12-06 MED ORDER — LACTATED RINGERS IV BOLUS
1000.0000 mL | Freq: Once | INTRAVENOUS | Status: AC
  Administered 2024-12-06: 1000 mL via INTRAVENOUS

## 2024-12-06 MED ORDER — ACETAMINOPHEN 650 MG RE SUPP: 650.0000 mg | Freq: Four times a day (QID) | RECTAL | Status: DC | PRN

## 2024-12-06 MED ORDER — METHYLPREDNISOLONE SODIUM SUCC 125 MG IJ SOLR: 125.0000 mg | Freq: Once | INTRAMUSCULAR | Status: DC

## 2024-12-06 MED ORDER — ACETAMINOPHEN 325 MG PO TABS: 650.0000 mg | ORAL_TABLET | Freq: Four times a day (QID) | ORAL | Status: DC | PRN

## 2024-12-06 MED ORDER — LEVALBUTEROL HCL 1.25 MG/0.5ML IN NEBU
1.2500 mg | INHALATION_SOLUTION | Freq: Four times a day (QID) | RESPIRATORY_TRACT | Status: DC
  Administered 2024-12-06 – 2024-12-10 (×16): 1.25 mg via RESPIRATORY_TRACT
  Filled 2024-12-06 (×19): qty 0.5

## 2024-12-06 MED ORDER — SODIUM CHLORIDE 3 % IN NEBU
4.0000 mL | INHALATION_SOLUTION | Freq: Two times a day (BID) | RESPIRATORY_TRACT | Status: AC
  Administered 2024-12-06 – 2024-12-08 (×6): 4 mL via RESPIRATORY_TRACT
  Filled 2024-12-06 (×7): qty 4

## 2024-12-06 MED ORDER — SODIUM CHLORIDE 0.9% FLUSH: 3.0000 mL | Freq: Two times a day (BID) | INTRAVENOUS | Status: DC

## 2024-12-06 MED ORDER — CITALOPRAM HYDROBROMIDE 20 MG PO TABS
20.0000 mg | ORAL_TABLET | Freq: Every day | ORAL | Status: AC
  Administered 2024-12-07 – 2024-12-21 (×15): 20 mg via ORAL
  Filled 2024-12-06 (×10): qty 1
  Filled 2024-12-06: qty 2
  Filled 2024-12-06 (×5): qty 1

## 2024-12-06 MED ORDER — SODIUM CHLORIDE 0.9 % IV SOLN
1.0000 g | INTRAVENOUS | Status: DC
  Administered 2024-12-07 – 2024-12-08 (×2): 1 g via INTRAVENOUS
  Filled 2024-12-06 (×2): qty 10

## 2024-12-06 MED ORDER — GUAIFENESIN 100 MG/5ML PO LIQD
10.0000 mL | Freq: Three times a day (TID) | ORAL | Status: DC
  Administered 2024-12-06 – 2024-12-08 (×6): 10 mL via ORAL
  Filled 2024-12-06 (×6): qty 10

## 2024-12-06 MED ORDER — ONDANSETRON HCL 4 MG/2ML IJ SOLN: 4.0000 mg | Freq: Four times a day (QID) | INTRAMUSCULAR | Status: AC | PRN

## 2024-12-06 MED ORDER — HYDRALAZINE HCL 20 MG/ML IJ SOLN: 10.0000 mg | INTRAMUSCULAR | Status: DC | PRN

## 2024-12-06 MED ORDER — FUROSEMIDE 10 MG/ML IJ SOLN
40.0000 mg | Freq: Once | INTRAMUSCULAR | Status: AC
  Administered 2024-12-06: 40 mg via INTRAVENOUS
  Filled 2024-12-06: qty 4

## 2024-12-06 MED ORDER — HYDROMORPHONE HCL 1 MG/ML IJ SOLN: 0.5000 mg | INTRAMUSCULAR | Status: DC | PRN

## 2024-12-06 MED ORDER — IPRATROPIUM-ALBUTEROL 0.5-2.5 (3) MG/3ML IN SOLN
3.0000 mL | Freq: Four times a day (QID) | RESPIRATORY_TRACT | Status: DC
  Administered 2024-12-06: 3 mL via RESPIRATORY_TRACT
  Filled 2024-12-06: qty 3

## 2024-12-06 MED ORDER — AZITHROMYCIN 500 MG PO TABS
500.0000 mg | ORAL_TABLET | Freq: Every day | ORAL | Status: DC
  Administered 2024-12-07 – 2024-12-08 (×2): 500 mg via ORAL
  Filled 2024-12-06 (×2): qty 1

## 2024-12-06 MED ORDER — ALBUTEROL SULFATE (2.5 MG/3ML) 0.083% IN NEBU
2.5000 mg | INHALATION_SOLUTION | RESPIRATORY_TRACT | Status: AC | PRN
  Administered 2024-12-18 – 2024-12-19 (×2): 2.5 mg via RESPIRATORY_TRACT
  Filled 2024-12-06 (×2): qty 3

## 2024-12-06 MED ORDER — SENNOSIDES-DOCUSATE SODIUM 8.6-50 MG PO TABS: 1.0000 | ORAL_TABLET | Freq: Every evening | ORAL | Status: DC | PRN

## 2024-12-06 MED ORDER — PANTOPRAZOLE SODIUM 40 MG PO TBEC
40.0000 mg | DELAYED_RELEASE_TABLET | Freq: Every day | ORAL | Status: AC
  Administered 2024-12-07 – 2024-12-21 (×15): 40 mg via ORAL
  Filled 2024-12-06 (×15): qty 1

## 2024-12-06 MED ORDER — FLEET ENEMA RE ENEM: 1.0000 | ENEMA | Freq: Once | RECTAL | Status: DC | PRN

## 2024-12-06 MED ORDER — HEPARIN SODIUM (PORCINE) 5000 UNIT/ML IJ SOLN
5000.0000 [IU] | Freq: Three times a day (TID) | INTRAMUSCULAR | Status: AC
  Administered 2024-12-06 – 2024-12-21 (×47): 5000 [IU] via SUBCUTANEOUS
  Filled 2024-12-06 (×47): qty 1

## 2024-12-06 MED ORDER — TRAZODONE HCL 50 MG PO TABS
25.0000 mg | ORAL_TABLET | Freq: Every evening | ORAL | Status: AC | PRN
  Administered 2024-12-09 – 2024-12-21 (×4): 25 mg via ORAL
  Filled 2024-12-06 (×6): qty 1

## 2024-12-06 MED ORDER — METHYLPREDNISOLONE SODIUM SUCC 125 MG IJ SOLR
125.0000 mg | Freq: Once | INTRAMUSCULAR | Status: AC
  Administered 2024-12-06: 125 mg via INTRAVENOUS
  Filled 2024-12-06: qty 2

## 2024-12-06 NOTE — Consult Note (Signed)
 "  NAME:  Brandon [name], MRN:  [id number], DOB:  18-Sep-1941, LOS: 0 ADMISSION DATE:  12/06/2024, CONSULTATION DATE:  12/06/24 REFERRING MD:  Dr. Marcene, CHIEF COMPLAINT:  COPD exacerbation   History of Present Illness:   Brandon [name] is an 84 y/o M with a PMH significant for HTN, HLD, AAA w/ repair, Grade II COPD (FEV1 1.59 L, 55% predicted) who presents with shortness of breath, cough and acute hypoxic respiratory failure. Pulmonology consulted for evaluation and management of the latter.  He has a history of COPD, grade two, with multiple admissions for exacerbations. Shortness of breath started suddenly this morning around 1:30 AM. A similar episode occurred two weeks ago, but he thought he was improving until the current episode.  He denies using home oxygen and was not discharged with oxygen after previous hospitalizations. He experiences shortness of breath sometimes when lying flat and has noticed swelling in his feet and legs, which is worse than usual. No fever, chills, night sweats, or coughing up sputum or blood.  He uses an inhaler, Breztri , two puffs in the morning and two puffs in the evening, and rinses his mouth after use. He also has a nebulizer at home.  He quit smoking cigarettes 13 years ago. Pertinent  Medical History   Past Medical History:  Diagnosis Date   AAA (abdominal aortic aneurysm)    COPD (chronic obstructive pulmonary disease) (HCC)    DIC (disseminated intravascular coagulation) 09/09/2012   Hyperlipidemia    Hypertension    Leg wound, left 12/12/2013   Peripheral vascular disease    Respiratory failure, post-operative 09/09/2012   Shortness of breath    Significant Hospital Events: Including procedures, antibiotic start and stop dates in addition to other pertinent events   1/22: admit hypoxic resp failure  Interim History / Subjective:  As above  Objective    Blood pressure 122/77, pulse 88, temperature 98.9 F (37.2 C), temperature source  Oral, resp. rate (!) 21, height 5' 11 (1.803 m), weight 119.7 kg, SpO2 93%.        Intake/Output Summary (Last 24 hours) at 12/06/2024 0927 Last data filed at 12/06/2024 0756 Gross per 24 hour  Intake 236.68 ml  Output --  Net 236.68 ml   Filed Weights   12/06/24 0501  Weight: 119.7 kg    Examination: CHEST: Wheezing present. CARDIOVASCULAR: Heart sounds distant. EXTREMITIES: 2+ pitting edema in lower extremities.  Resolved problem list   Assessment and Plan   Acute exacerbation of COPD Grade 2 COPD exacerbation with dyspnea, wheezing, and bilateral infiltrates. No home oxygen use or recent smoking. Currently on Breztri  inhaler. - Continue Breztri  inhaler, two puffs twice a day. - Administer aggressive pulmonary treatment with bronchodilators, hypertonic saline, and chest physiotherapy. - Arrange pulmonary follow-up to discuss therapies to prevent exacerbations, including chronic macrolide therapies, Roflumilast, or biologics.  Community-acquired pneumonia Suspected pneumonia with bilateral infiltrates and wheezing. No productive cough or systemic symptoms. - Administer ceftriaxone  and azithromycin  for five days. - Obtain sputum cultures  Acute decompensated diastolic heart failure Concern for a diastolic CHF as patient appears hypervolemic on examination.  - Ordered echocardiogram. - Ordered BNP. - Consider diuresis - Replete electrolytes as needed  Patient Lines/Drains/Airways Status     Active Line/Drains/Airways     Name Placement date Placement time Site Days   Peripheral IV 18 G Left Antecubital --  --  Antecubital  --   Peripheral IV 12/06/24 20 G Right Antecubital 12/06/24  0639  Antecubital  less  than 1            Labs   CBC: Recent Labs  Lab 12/06/24 0514  WBC 18.2*  HGB 15.0  HCT 43.8  MCV 98.2  PLT 233    Basic Metabolic Panel: Recent Labs  Lab 12/06/24 0514  NA 134*  K 4.0  CL 100  CO2 25  GLUCOSE 109*  BUN 14  CREATININE 0.73   CALCIUM  8.2*   GFR: Estimated Creatinine Clearance: 95.4 mL/min (by C-G formula based on SCr of 0.73 mg/dL). Recent Labs  Lab 12/06/24 0514 12/06/24 0651  WBC 18.2*  --   LATICACIDVEN  --  3.1*    Liver Function Tests: No results for input(s): AST, ALT, ALKPHOS, BILITOT, PROT, ALBUMIN  in the last 168 hours. No results for input(s): LIPASE, AMYLASE in the last 168 hours. No results for input(s): AMMONIA in the last 168 hours.  ABG    Component Value Date/Time   PHART 7.46 (H) 08/06/2022 1445   PCO2ART 30 (L) 08/06/2022 1445   PO2ART 77 (L) 08/06/2022 1445   HCO3 29.3 (H) 11/21/2024 0326   TCO2 31 11/21/2024 0326   ACIDBASEDEF 1.6 08/06/2022 1445   O2SAT 85 11/21/2024 0326     Coagulation Profile: No results for input(s): INR, PROTIME in the last 168 hours.  Cardiac Enzymes: No results for input(s): CKTOTAL, CKMB, CKMBINDEX, TROPONINI in the last 168 hours.  HbA1C: No results found for: HGBA1C  CBG: No results for input(s): GLUCAP in the last 168 hours.  Review of Systems:   Not obtained  Past Medical History:  He,  has a past medical history of AAA (abdominal aortic aneurysm), COPD (chronic obstructive pulmonary disease) (HCC), DIC (disseminated intravascular coagulation) (09/09/2012), Hyperlipidemia, Hypertension, Leg wound, left (12/12/2013), Peripheral vascular disease, Respiratory failure, post-operative (09/09/2012), and Shortness of breath.   Surgical History:   Past Surgical History:  Procedure Laterality Date   ABDOMINAL AORTIC ANEURYSM REPAIR  09/08/2012   Procedure: ANEURYSM ABDOMINAL AORTIC REPAIR;  Surgeon: Lynwood JONETTA Collum, MD;  Location: [facility] OR;  Service: Vascular;  Laterality: N/A;  open repair ruptured abdomenal aortic aneurysm   CYSTOSCOPY W/ RETROGRADES Bilateral 01/24/2023   Procedure: FLEXIBLE CYSTOSCOPY;  Surgeon: Carolee Sherwood JONETTA DOUGLAS, MD;  Location: [facility] ORS;  Service: Urology;  Laterality: Bilateral;  60 MINS    GANGLION CYST EXCISION     Right wrist   MASS EXCISION N/A 01/24/2023   Procedure: EXCISION PENILE  MASS;  Surgeon: Carolee Sherwood JONETTA DOUGLAS, MD;  Location: [facility] ORS;  Service: Urology;  Laterality: N/A;     Social History:   reports that he quit smoking about 12 years ago. His smoking use included cigarettes. He started smoking about 72 years ago. He has a 60 pack-year smoking history. He has never used smokeless tobacco. He reports that he does not drink alcohol and does not use drugs.   Family History:  His family history includes Cancer in his father, mother, and sister; Dementia in his mother; Heart attack in his sister; Heart disease in his brother and father.   Allergies Allergies[1]   Home Medications  Prior to Admission medications  Medication Sig Start Date End Date Taking? Authorizing Provider  albuterol  (VENTOLIN  HFA) 108 (90 Base) MCG/ACT inhaler Inhale 2 puffs into the lungs every 4 (four) hours as needed for wheezing or shortness of breath. Patient taking differently: Inhale 2 puffs into the lungs in the morning and at bedtime. 12/08/20   Armenta Canning, MD  aspirin  81 MG  chewable tablet Chew 81 mg by mouth daily.    [provider]  atorvastatin  (LIPITOR) 10 MG tablet Take 10 mg by mouth daily.    [provider]  budesonide -glycopyrrolate-formoterol (BREZTRI  AEROSPHERE) 160-9-4.8 MCG/ACT AERO inhaler Inhale 2 puffs into the lungs in the morning and at bedtime. 09/04/24   Hattar, Zola SAILOR, MD  citalopram  (CELEXA ) 20 MG tablet TAKE 1 TABLET BY MOUTH EVERY DAY 05/26/18   Levora Reyes SAUNDERS, MD  guaiFENesin  (MUCINEX ) 600 MG 12 hr tablet Take 1 tablet (600 mg total) by mouth 2 (two) times daily for 10 days. 11/28/24 12/08/24  Drusilla Sabas RAMAN, MD  ipratropium-albuterol  (DUONEB) 0.5-2.5 (3) MG/3ML SOLN Take 3 mLs by nebulization every 6 (six) hours as needed (Shortness of breath). 11/28/24   Drusilla Sabas RAMAN, MD  irbesartan  (AVAPRO ) 150 MG tablet Take 150 mg by mouth daily.     [provider]  omeprazole  (PRILOSEC) 20 MG capsule Take 20 mg by mouth daily.    [provider]  predniSONE  (DELTASONE ) 10 MG tablet Take 3 tablets (30 mg total) by mouth daily with breakfast for 3 days, THEN 2 tablets (20 mg total) daily with breakfast for 3 days, THEN 1 tablet (10 mg total) daily with breakfast for 3 days. 11/29/24 12/08/24  Drusilla Sabas RAMAN, MD  Spacer/Aero-Holding Chambers (AEROCHAMBER PLUS WITH MASK) inhaler Use as instructed 12/08/20   Armenta Canning, MD     Thank you for this interesting consult. I have spent 55 minutes evaluating patient, reviewing chart, and discussing plan of care with patient, family, and primary medical team. If you have any questions or concerns please reach out to me via secure chat.  Paula Southerly, MD [facility]             [1]  Allergies Allergen Reactions   Umeclidinium-Vilanterol Other (See Comments)    Unknown reaction - patient not aware of being allergic to this   "

## 2024-12-06 NOTE — Progress Notes (Signed)
 RT in to check new admit on HHFNC. Upon arrival, pt on 6L nasal cannula. Pt transitioned to salter HFNC at 12L without complication. Pt with improvement in wob and SpO2. Pt endorses feeling comfortable at this time. HHFNC left in room on standby. RT will continue to monitor and be available as needed.

## 2024-12-06 NOTE — Plan of Care (Signed)

## 2024-12-06 NOTE — Progress Notes (Signed)
" °  Carryover admission to the Day Admitter.  I discussed this case with the EDP, Dr. Haze.  Per these discussions:   This is a 84 year old male with history of COPD, but no known baseline supplemental oxygen Apartments, presents with complaint of 1 to 2 days of shortness of breath, cough, found to be hypoxic with initial oxygen saturation in the low 80s on room air, [place] improving into the mid 90s on 4 L nasal cannula.  Has received Solu-Medrol  and duo nebulizers via EMS.  Chest x-ray was suggestive of infiltrates which was started on azithromycin  and Rocephin .  No chest pain.  I have placed an order for inpatient for further evaluation management of the above.  I have placed some additional preliminary admit orders via the adult multi-morbid admission order set. I have also ordered scheduled duo nebulizer treatments, prn albuterol  nebulizer treatments.  I will defer to the admitting hospitalist additional orders for additional systemic corticosteroids as well as additional antibiotics for suspected community-acquired pneumonia.    Eva Pore, DO Hospitalist  "

## 2024-12-06 NOTE — Progress Notes (Signed)
 Pt SOB and unable to clear any mucous from lungs due to it being too thick per pt. HHFNC 50L 41% initiated for SOB/WOB and O2 needs. Hypertonic added BID to help thin mucous. Flutter given to pt.    12/06/24 0930  Therapy Vitals  Pulse Rate 78  Resp (!) 32  BP 106/68  Patient Position (if appropriate) Sitting  MEWS Score/Color  MEWS Score 2  MEWS Score Color Yellow  Respiratory Assessment  Assessment Type Pre-treatment  Respiratory Pattern Regular;Labored;Dyspnea at rest;Nasal flaring  Cough Productive;Strong  Sputum Amount None (Pt states mucous is too thick to cough up.)  R Upper  Breath Sounds Clear  L Upper Breath Sounds Clear  R Lower Breath Sounds Diminished  L Lower Breath Sounds Diminished  Oxygen Therapy/Pulse Ox  O2 Device HHFNC  SpO2 92 %  O2 Therapy Oxygen humidified  O2 Flow Rate (L/min) 50 L/min  FiO2 (%) 41 %  Heated High Flow Nasal Cannula  Adult Large  $ Adult Large Yes  $ High Flow Nasal POS Pressure Daily Yes  $ Kit Airspiral Circuit Neb Adpt Yes  Heater temperature 98.6 F (37 C)

## 2024-12-06 NOTE — ED Notes (Signed)
 HOS. BED. ORD

## 2024-12-06 NOTE — H&P (Signed)
 " History and Physical   Patient: Brandon [name]                            PCP: Seabron Lenis, MD                    DOB: 1941-02-19            DOA: 12/06/2024 FMW:[id number]             DOS: 12/06/2024, 8:38 AM  Seabron Lenis, MD  Patient coming from:   HOME  I have personally reviewed patient's medical records, in electronic medical records, including:  [place] link, and care everywhere.    Chief Complaint:   Chief Complaint  Patient presents with   Shortness of Breath    History of present illness:    Brandon [name] is a 84 year old male with extensive history of COPD (not O2 dependent), HTN, HLD, depression, AAA, with repair,, Presented once again with shortness of breath, cough, which progressively got worse overnight.  Was satting 80% on room air subsequently improved to 90s on 4 L of oxygen,  ED Evaluation: POA 80% on room air On arrival was satting 80s on room air blood pressure 122/77, pulse 88, temperature 98.9 F (37.2 C), temperature source Oral, resp. rate (!) 21, height 5' 11 (1.803 m), weight 119.7 kg, SpO2 93% -on 4 L of oxygen  LABs: Sodium 134, glucose 109, WBC 18.2, lactic acid 3.1, Pending respiratory panel Patient has been placed on 4 L of oxygen, received IV Solu-Medrol , nebs, Chest x-ray consistent with infiltrates Started on IV antibiotics of azithromycin /Rocephin  Cultures been obtained   Patient Denies having: Fever, Chills, Cough, SOB, Chest Pain, Abd pain, N/V/D, headache, dizziness, lightheadedness,  Dysuria, Joint pain, rash, open wounds     Review of Systems: As per HPI, otherwise 10 point review of systems were negative.   ----------------------------------------------------------------------------------------------------------------------  Allergies[1]  Home MEDs:  Prior to Admission medications  Medication Sig Start Date End Date Taking? Authorizing Provider  albuterol  (VENTOLIN  HFA) 108 (90 Base) MCG/ACT inhaler Inhale 2 puffs  into the lungs every 4 (four) hours as needed for wheezing or shortness of breath. Patient taking differently: Inhale 2 puffs into the lungs in the morning and at bedtime. 12/08/20   Armenta Canning, MD  aspirin  81 MG chewable tablet Chew 81 mg by mouth daily.    [provider]  atorvastatin  (LIPITOR) 10 MG tablet Take 10 mg by mouth daily.    [provider]  budesonide -glycopyrrolate-formoterol (BREZTRI  AEROSPHERE) 160-9-4.8 MCG/ACT AERO inhaler Inhale 2 puffs into the lungs in the morning and at bedtime. 09/04/24   Hattar, Zola SAILOR, MD  citalopram  (CELEXA ) 20 MG tablet TAKE 1 TABLET BY MOUTH EVERY DAY 05/26/18   Levora Reyes SAUNDERS, MD  guaiFENesin  (MUCINEX ) 600 MG 12 hr tablet Take 1 tablet (600 mg total) by mouth 2 (two) times daily for 10 days. 11/28/24 12/08/24  Drusilla Sabas RAMAN, MD  ipratropium-albuterol  (DUONEB) 0.5-2.5 (3) MG/3ML SOLN Take 3 mLs by nebulization every 6 (six) hours as needed (Shortness of breath). 11/28/24   Drusilla Sabas RAMAN, MD  irbesartan  (AVAPRO ) 150 MG tablet Take 150 mg by mouth daily.    [provider]  omeprazole  (PRILOSEC) 20 MG capsule Take 20 mg by mouth daily.    [provider]  predniSONE  (DELTASONE ) 10 MG tablet Take 3 tablets (30 mg total) by mouth daily with breakfast for 3  days, THEN 2 tablets (20 mg total) daily with breakfast for 3 days, THEN 1 tablet (10 mg total) daily with breakfast for 3 days. 11/29/24 12/08/24  Drusilla Sabas RAMAN, MD  Spacer/Aero-Holding Chambers (AEROCHAMBER PLUS WITH MASK) inhaler Use as instructed 12/08/20   Armenta Canning, MD    PRN MEDs: acetaminophen  **OR** acetaminophen , albuterol , bisacodyl , hydrALAZINE , HYDROmorphone  (DILAUDID ) injection, ondansetron  **OR** ondansetron  (ZOFRAN ) IV, oxyCODONE , senna-docusate, traZODone   Past Medical History:  Diagnosis Date   AAA (abdominal aortic aneurysm)    COPD (chronic obstructive pulmonary disease) (HCC)    DIC (disseminated intravascular coagulation) 09/09/2012    Hyperlipidemia    Hypertension    Leg wound, left 12/12/2013   Peripheral vascular disease    Respiratory failure, post-operative 09/09/2012   Shortness of breath     Past Surgical History:  Procedure Laterality Date   ABDOMINAL AORTIC ANEURYSM REPAIR  09/08/2012   Procedure: ANEURYSM ABDOMINAL AORTIC REPAIR;  Surgeon: Lynwood JONETTA Collum, MD;  Location: [facility] OR;  Service: Vascular;  Laterality: N/A;  open repair ruptured abdomenal aortic aneurysm   CYSTOSCOPY W/ RETROGRADES Bilateral 01/24/2023   Procedure: FLEXIBLE CYSTOSCOPY;  Surgeon: Carolee Sherwood JONETTA DOUGLAS, MD;  Location: [facility] ORS;  Service: Urology;  Laterality: Bilateral;  60 MINS   GANGLION CYST EXCISION     Right wrist   MASS EXCISION N/A 01/24/2023   Procedure: EXCISION PENILE  MASS;  Surgeon: Carolee Sherwood JONETTA DOUGLAS, MD;  Location: [facility] ORS;  Service: Urology;  Laterality: N/A;     reports that he quit smoking about 12 years ago. His smoking use included cigarettes. He started smoking about 72 years ago. He has a 60 pack-year smoking history. He has never used smokeless tobacco. He reports that he does not drink alcohol and does not use drugs.   Family History  Problem Relation Age of Onset   Dementia Mother    Cancer Mother    Cancer Father    Heart disease Father    Heart attack Sister    Cancer Sister    Heart disease Brother     Physical Exam:   Vitals:   12/06/24 0459 12/06/24 0501 12/06/24 0615 12/06/24 0747  BP: (!) 149/73  122/77   Pulse: 89  88   Resp: (!) 25  (!) 21   Temp: 98.9 F (37.2 C)     TempSrc: Oral     SpO2: 93%  94% 93%  Weight:  119.7 kg    Height:  5' 11 (1.803 m)     Constitutional: NAD, calm, comfortable Eyes: PERRL, lids and conjunctivae normal ENMT: Mucous membranes are moist. Posterior pharynx clear of any exudate or lesions.Normal dentition.  Neck: normal, supple, no masses, no thyromegaly Respiratory: Diffuse wheezing, rhonchi, no crackles. Normal respiratory effort. No accessory muscle use.   Cardiovascular: Regular rate and rhythm, no murmurs / rubs / gallops. No extremity edema. 2+ pedal pulses. No carotid bruits.  Abdomen: no tenderness, no masses palpated. No hepatosplenomegaly. Bowel sounds positive.  Musculoskeletal: no clubbing / cyanosis. No joint deformity upper and lower extremities. Good ROM, no contractures. Normal muscle tone.  Neurologic: CN II-XII grossly intact. Sensation intact, DTR normal. Strength 5/5 in all 4.  Psychiatric: Normal judgment and insight. Alert and oriented x 3. Normal mood.  Skin: no rashes, lesions, ulcers. No induration      Labs on admission:    I have personally reviewed following labs and imaging studies  CBC: Recent Labs  Lab 12/06/24 0514  WBC 18.2*  HGB  15.0  HCT 43.8  MCV 98.2  PLT 233   Basic Metabolic Panel: Recent Labs  Lab 12/06/24 0514  NA 134*  K 4.0  CL 100  CO2 25  GLUCOSE 109*  BUN 14  CREATININE 0.73  CALCIUM  8.2*    BNP (last 3 results) Recent Labs    11/21/24 0304 12/06/24 0514  PROBNP 66.0 136.0    Urine analysis:    Component Value Date/Time   COLORURINE BIOCHEMICALS MAY BE AFFECTED BY COLOR (A) 08/10/2022 1935   APPEARANCEUR CLOUDY (A) 08/10/2022 1935   LABSPEC 1.020 08/10/2022 1935   PHURINE 5.5 08/10/2022 1935   GLUCOSEU NEGATIVE 08/10/2022 1935   HGBUR LARGE (A) 08/10/2022 1935   BILIRUBINUR NEGATIVE 08/10/2022 1935   KETONESUR NEGATIVE 08/10/2022 1935   PROTEINUR 100 (A) 08/10/2022 1935   UROBILINOGEN 4.0 (H) 09/16/2012 1743   NITRITE POSITIVE (A) 08/10/2022 1935   LEUKOCYTESUR TRACE (A) 08/10/2022 1935    Last A1C:  No results found for: HGBA1C   Radiologic Exams on Admission:   DG Chest 2 View Result Date: 12/06/2024 EXAM: 2 VIEW(S) XRAY OF THE CHEST 12/06/2024 05:33:00 AM COMPARISON: 11/21/2024 CLINICAL HISTORY: 84 year old male with sudden onset shortness of breath when lying down. FINDINGS: LUNGS AND PLEURA: Bilateral lower lobe streaky opacity, increased since  previous. No definite pleural effusions. No pneumothorax. No pulmonary edema. HEART AND MEDIASTINUM: Cardiomegaly. Calcified aorta. BONES AND SOFT TISSUES: No acute osseous abnormality. IMPRESSION: 1. Streaky bilateral lower lung opacity, indeterminate for atelectasis versus infection. No definite pleural effusion. Electronically signed by: Helayne Hurst MD 12/06/2024 05:43 AM EST RP Workstation: [id number]    EKG:   Independently reviewed.  Orders placed or performed during the hospital encounter of 12/06/24   ED EKG   ED EKG   EKG 12-Lead   ---------------------------------------------------------------------------------------------------------------------------------------    Assessment / Plan:   Principal Problem:   Acute hypoxic respiratory failure (HCC) Active Problems:   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)   AAA (abdominal aortic aneurysm, ruptured) (HCC)   Primary hypertension   Dyspnea on exertion   Hyperlipidemia   Intracranial hemorrhage (HCC)   COPD exacerbation (HCC)   Assessment and Plan:  Assessment and Plan:   Acute hypoxic respiratory failure (HCC)/associated with a community-acquired pneumonia -exacerbated with underlying COPD, COPD exacerbation POA 80% on room air, currently satting 92% on 4 L of oxygen -With lactic acidosis of 3.1, WBC of 18.2 -Chest x-ray reveals infiltrates -Started on IV antibiotics, will continue accordingly -Will follow the blood/sputum cultures -Pending upper respiratory viral panel - Initiating supportive therapy, bronchodilators scheduled and as needed, - Brief course of IV steroids - Pulmonary [place]      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) Not O2 dependent at baseline, will discharge on room air recently -Recurrent exacerbation -Present on admission 80% room air Mild signs of exacerbation requiring 4 L of oxygen, diffuse mild wheezing -Treated with IV steroids,  will transition to p.o. 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  Atherosclerosis of native artery of extremity with intermittent claudication Currently stable denies any chest pain, continue statins, aspirin , amlodipine    Peripheral vascular disease, unspecified Continue aspirin  and statins   AAA (abdominal aortic aneurysm, ruptured) (HCC) History of AAA - 2013 - Patient denies any chest pain Last echo 08/04/2022, EF 55-60%, normal LV function, no LVH, no structural abnormalities reported  09/08/24 -vascular US  AAA duplex-last vascular study of abdominal aorta: Common iliac graft noted-around 2.5 centimeter       Consults called:  None -------------------------------------------------------------------------------------------------------------------------------------------- DVT prophylaxis:  heparin  injection 5,000 Units Start: 12/06/24 1400 Place TED hose Start: 12/06/24 0813 SCDs Start: 12/06/24 0646   Code Status:   Code Status: Full Code   Admission status: Patient will be admitted as Inpatient, with a greater than 2 midnight length of stay. Level of care: Progressive   Family Communication:  none at bedside  (The above findings and plan of care has been discussed with patient in detail, the patient expressed understanding and agreement of above plan)   --------------------------------------------------------------------------------------------------------------------------------------------------  Disposition Plan:  Anticipated 1-2 days Status is: Inpatient Remains inpatient appropriate because: Needing respiratory support, IV antibiotics, IV steroids, nebs     ----------------------------------------------------------------------------------------------------------------------------------------------------  Time spent:  84  Min.  Was spent seeing and evaluating the patient, reviewing all medical records, drawn plan of care.  SIGNED: Adriana DELENA Grams, MD, FHM. FAAFP.  - [facility], Pager  (Please use [email or web address] to page/ or secure chat through epic) If 7PM-7AM, please contact night-coverage [email or web address],  12/06/2024, 8:38 AM     [1]  Allergies Allergen Reactions   Umeclidinium-Vilanterol Other (See Comments)    Unknown reaction - patient not aware of being allergic to this   "

## 2024-12-06 NOTE — ED Notes (Signed)
 Pt having a hard time tolerating the Duoneb on the mask, asking to stop the neb treatment. Neb treatment stopped, RT called to bedside.

## 2024-12-06 NOTE — ED Notes (Signed)
Lab results was reported to Nurse. 

## 2024-12-06 NOTE — Hospital Course (Addendum)
 Patient with PMH of COPD, HTN, HLD, AAA, depression, obesity presents to the hospital with complaints of progressively worsening cough and shortness of breath as well as confusion.  Significant events: 1/7 - 1/14 admitted for acute hypoxic respiratory failure secondary to COPD exacerbation.  Was able to be weaned down to room air at the time of the discharge. 1/22 presented due to worsening shortness of breath especially when lying down.  Admitted to the hospitalist service.  PCCM was consulted.  Lactic acid elevated. 1/23 echocardiogram EF 55 to 60%, no WMA, no valvular abnormality, RV normal. 1/24 echo bubble study negative for any shunt. 1/25 switch to oral antibiotics  Consults: PCCM   Assessment and plan: Acute hypoxic respiratory failure. Secondary to COPD exacerbation as well as acute on chronic diastolic CHF. Community-acquired pneumonia. Patient was able to be weaned down to room air during his last admission when he was presented to the hospital with COPD exacerbation. Currently requiring heated high flow for oxygenation. Continue flutter valve.  Treated with hypertonic saline nebs. Initially was on IV ceftriaxone  and azithromycin .  Now on oral cefadroxil and azithromycin . Blood cultures negative.  MRSA PCR negative. Procalcitonin is negative. Respiratory pathogen panel negative. Currently on IV Solu-Medrol .   Continue nebulizer therapy.  Acute on chronic HFpEF. Echocardiogram shows preserved EF, no wall motion abnormality.  No valvular abnormality. Echo bubble study negative for any shunt. Treated with IV diuresis. Currently on hold due to worsening renal function.  Oropharyngeal candidiasis. On Diflucan . Most likely due to his recent steroid. Most likely will worsen with ongoing use of antibiotics and steroids.  History of SDH as well as ICH. No focal deficit. Intermittent confusion noted by family in the setting of hypoxia.  HTN. On amlodipine . Given  significant lower extremity edema would recommend to discontinue this medication upon discharge.  History of CAD. PAD AAA with repair. Currently no chest pain. Echocardiogram no wall motion abnormality. EKG nonischemic. Continue aspirin   Leukocytosis. This is mostly steroid-induced. For now we will monitor.  Lactic acidosis. Likely due to increased work of breathing.  Resolved.  LFT elevation. AST ALT ratio 1 is to 2.  AST normalized.  Hard of hearing. Noted.  Obesity Class 2 Body mass index is 36.77 kg/m.  Placing the pt at higher risk of poor outcomes.   Code Status: Full Code

## 2024-12-06 NOTE — ED Triage Notes (Signed)
 BIB [facility] from home with c/o SOB that was sudden onset when laying down, pt states that it was a little better when he sat uup. EMS reports room air saturation was 82, pt placed on 4 LPM via  and pt also received duoneb en route. 125 mg solumedrol. Pitting edema noted in BLE  BP 148/86 HR 80s Spo2 92  18 g LAC

## 2024-12-06 NOTE — ED Provider Notes (Signed)
 " [facility] Provider Note   CSN: [id number] Arrival date & time: 12/06/24  0454     Patient presents with: Shortness of Breath   Brandon [name] is a 84 y.o. male.   Presents to the emergency department for evaluation of shortness of breath and cough.  Patient reports that he does have a history of COPD, however is not on oxygen at home.  Patient reports overnight he suddenly became short of breath and has developed a harsh cough.  He comes to the emergency department by ambulance.  Patient had room air oxygen saturations of 82% before being placed on 4 L by nasal cannula for transport.  Patient administered Solu-Medrol  and DuoNeb during transport.       Prior to Admission medications  Medication Sig Start Date End Date Taking? Authorizing Provider  albuterol  (VENTOLIN  HFA) 108 (90 Base) MCG/ACT inhaler Inhale 2 puffs into the lungs every 4 (four) hours as needed for wheezing or shortness of breath. Patient taking differently: Inhale 2 puffs into the lungs in the morning and at bedtime. 12/08/20   Armenta Canning, MD  aspirin  81 MG chewable tablet Chew 81 mg by mouth daily.    [provider]  atorvastatin  (LIPITOR) 10 MG tablet Take 10 mg by mouth daily.    [provider]  budesonide -glycopyrrolate-formoterol (BREZTRI  AEROSPHERE) 160-9-4.8 MCG/ACT AERO inhaler Inhale 2 puffs into the lungs in the morning and at bedtime. 09/04/24   Hattar, Zola SAILOR, MD  citalopram  (CELEXA ) 20 MG tablet TAKE 1 TABLET BY MOUTH EVERY DAY 05/26/18   Levora Reyes SAUNDERS, MD  guaiFENesin  (MUCINEX ) 600 MG 12 hr tablet Take 1 tablet (600 mg total) by mouth 2 (two) times daily for 10 days. 11/28/24 12/08/24  Drusilla Sabas RAMAN, MD  ipratropium-albuterol  (DUONEB) 0.5-2.5 (3) MG/3ML SOLN Take 3 mLs by nebulization every 6 (six) hours as needed (Shortness of breath). 11/28/24   Drusilla Sabas RAMAN, MD  irbesartan  (AVAPRO ) 150 MG tablet Take 150 mg by mouth daily.     [provider]  omeprazole  (PRILOSEC) 20 MG capsule Take 20 mg by mouth daily.    [provider]  predniSONE  (DELTASONE ) 10 MG tablet Take 3 tablets (30 mg total) by mouth daily with breakfast for 3 days, THEN 2 tablets (20 mg total) daily with breakfast for 3 days, THEN 1 tablet (10 mg total) daily with breakfast for 3 days. 11/29/24 12/08/24  Drusilla Sabas RAMAN, MD  Spacer/Aero-Holding Chambers (AEROCHAMBER PLUS WITH MASK) inhaler Use as instructed 12/08/20   Armenta Canning, MD    Allergies: Umeclidinium-vilanterol    Review of Systems  Respiratory:  Positive for cough and shortness of breath.     Updated Vital Signs BP (!) 149/73   Pulse 89   Temp 98.9 F (37.2 C) (Oral)   Resp (!) 25   Ht 5' 11 (1.803 m)   Wt 119.7 kg   SpO2 93%   BMI 36.82 kg/m   Physical Exam Vitals and nursing note reviewed.  Constitutional:      General: He is not in acute distress.    Appearance: He is well-developed.  HENT:     Head: Normocephalic and atraumatic.     Mouth/Throat:     Mouth: Mucous membranes are moist.  Eyes:     General: Vision grossly intact. Gaze aligned appropriately.     Extraocular Movements: Extraocular movements intact.     Conjunctiva/sclera: Conjunctivae normal.  Cardiovascular:  Rate and Rhythm: Normal rate and regular rhythm.     Pulses: Normal pulses.     Heart sounds: Normal heart sounds, S1 normal and S2 normal. No murmur heard.    No friction rub. No gallop.  Pulmonary:     Effort: Tachypnea and accessory muscle usage present. No respiratory distress.     Breath sounds: Decreased breath sounds and rhonchi present.  Abdominal:     Palpations: Abdomen is soft.     Tenderness: There is no abdominal tenderness. There is no guarding or rebound.     Hernia: No hernia is present.  Musculoskeletal:        General: No swelling.     Cervical back: Full passive range of motion without pain, normal range of motion and neck supple. No pain with  movement, spinous process tenderness or muscular tenderness. Normal range of motion.     Right lower leg: Edema present.     Left lower leg: Edema present.  Skin:    General: Skin is warm and dry.     Capillary Refill: Capillary refill takes less than 2 seconds.     Findings: No ecchymosis, erythema, lesion or wound.  Neurological:     Mental Status: He is alert and oriented to person, place, and time.     GCS: GCS eye subscore is 4. GCS verbal subscore is 5. GCS motor subscore is 6.     Cranial Nerves: Cranial nerves 2-12 are intact.     Sensory: Sensation is intact.     Motor: Motor function is intact. No weakness or abnormal muscle tone.     Coordination: Coordination is intact.  Psychiatric:        Mood and Affect: Mood normal.        Speech: Speech normal.        Behavior: Behavior normal.     (all labs ordered are listed, but only abnormal results are displayed) Labs Reviewed  BASIC METABOLIC PANEL WITH GFR - Abnormal; Notable for the following components:      Result Value   Sodium 134 (*)    Glucose, Bld 109 (*)    Calcium  8.2 (*)    All other components within normal limits  CBC - Abnormal; Notable for the following components:   WBC 18.2 (*)    All other components within normal limits  CULTURE, BLOOD (ROUTINE X 2)  CULTURE, BLOOD (ROUTINE X 2)  PRO BRAIN NATRIURETIC PEPTIDE  I-STAT CG4 LACTIC ACID, ED  TROPONIN T, HIGH SENSITIVITY    EKG: EKG Interpretation Date/Time:  Thursday December 06 2024 05:40:36 EST Ventricular Rate:  99 PR Interval:  134 QRS Duration:  118 QT Interval:  348 QTC Calculation: 446 R Axis:   50  Text Interpretation: Sinus rhythm with Premature atrial complexes Incomplete right bundle branch block Borderline ECG When compared with ECG of 21-Nov-2024 02:52, No acute changes Confirmed by Haze Lonni PARAS ([id number]) on 12/06/2024 5:52:37 AM  Radiology: [facility] Chest 2 View Result Date: 12/06/2024 EXAM: 2 VIEW(S) XRAY OF THE CHEST  12/06/2024 05:33:00 AM COMPARISON: 11/21/2024 CLINICAL HISTORY: 84 year old male with sudden onset shortness of breath when lying down. FINDINGS: LUNGS AND PLEURA: Bilateral lower lobe streaky opacity, increased since previous. No definite pleural effusions. No pneumothorax. No pulmonary edema. HEART AND MEDIASTINUM: Cardiomegaly. Calcified aorta. BONES AND SOFT TISSUES: No acute osseous abnormality. IMPRESSION: 1. Streaky bilateral lower lung opacity, indeterminate for atelectasis versus infection. No definite pleural effusion. Electronically signed by: Helayne Hurst MD 12/06/2024 05:43  AM EST RP Workstation: [id number]     Procedures   Medications Ordered in the ED  cefTRIAXone  (ROCEPHIN ) 1 g in sodium chloride  0.9 % 100 mL IVPB (has no administration in time range)  azithromycin  (ZITHROMAX ) 500 mg in sodium chloride  0.9 % 250 mL IVPB (has no administration in time range)                                    Medical Decision Making Amount and/or Complexity of Data Reviewed External Data Reviewed: labs, radiology, ECG and notes. Labs: ordered. Decision-making details documented in ED Course. Radiology: ordered and independent interpretation performed. Decision-making details documented in ED Course. ECG/medicine tests: ordered and independent interpretation performed. Decision-making details documented in ED Course.   Differential Diagnosis considered includes, but not limited to: COPD exacerbation; Bronchitis; Pneumonia; CHF; ACS; PE  Presents to the emergency department for evaluation of fairly sudden onset of shortness of breath and cough, developing tonight.  Patient does have a history of COPD, was recently hospitalized for COPD exacerbation.  Examination reveals tachypnea, diffuse rhonchi.  Patient also has 2+ pitting edema bilaterally which he reports is new.  Cardiac evaluation is reassuring, normal troponin, normal BNP.  X-ray looks suspicious for pneumonia.  Will administer  Rocephin  and Zithromax , admit.     Final diagnoses:  COPD exacerbation (HCC)  Community acquired pneumonia, unspecified laterality    ED Discharge Orders     None          Osualdo Hansell, Lonni PARAS, MD 12/06/24 [phone]  "

## 2024-12-07 ENCOUNTER — Inpatient Hospital Stay (HOSPITAL_COMMUNITY)

## 2024-12-07 DIAGNOSIS — I5031 Acute diastolic (congestive) heart failure: Secondary | ICD-10-CM

## 2024-12-07 DIAGNOSIS — J9601 Acute respiratory failure with hypoxia: Secondary | ICD-10-CM | POA: Diagnosis not present

## 2024-12-07 LAB — BASIC METABOLIC PANEL WITH GFR
Anion gap: 9 (ref 5–15)
BUN: 19 mg/dL (ref 8–23)
CO2: 25 mmol/L (ref 22–32)
Calcium: 8.5 mg/dL — ABNORMAL LOW (ref 8.9–10.3)
Chloride: 99 mmol/L (ref 98–111)
Creatinine, Ser: 0.8 mg/dL (ref 0.61–1.24)
GFR, Estimated: >60 mL/min
Glucose, Bld: 116 mg/dL — ABNORMAL HIGH (ref 70–99)
Potassium: 4.7 mmol/L (ref 3.5–5.1)
Sodium: 134 mmol/L — ABNORMAL LOW (ref 135–145)

## 2024-12-07 LAB — ECHOCARDIOGRAM COMPLETE
AR max vel: 2.57 cm2
AV Area VTI: 2.53 cm2
AV Area mean vel: 2.66 cm2
AV Mean grad: 6 mmHg
AV Peak grad: 12.3 mmHg
Ao pk vel: 1.75 m/s
Area-P 1/2: 3.99 cm2
Height: 71 in
S' Lateral: 3.4 cm
Weight: 4224 [oz_av]

## 2024-12-07 LAB — CBC
HCT: 39.3 % (ref 39.0–52.0)
Hemoglobin: 13.5 g/dL (ref 13.0–17.0)
MCH: 33.1 pg (ref 26.0–34.0)
MCHC: 34.4 g/dL (ref 30.0–36.0)
MCV: 96.3 fL (ref 80.0–100.0)
Platelets: 224 K/uL (ref 150–400)
RBC: 4.08 MIL/uL — ABNORMAL LOW (ref 4.22–5.81)
RDW: 12.7 % (ref 11.5–15.5)
WBC: 24.5 K/uL — ABNORMAL HIGH (ref 4.0–10.5)
nRBC: 0 % (ref 0.0–0.2)

## 2024-12-07 LAB — GLUCOSE, CAPILLARY: Glucose-Capillary: 114 mg/dL — ABNORMAL HIGH (ref 70–99)

## 2024-12-07 MED ORDER — FLUCONAZOLE 100 MG PO TABS
200.0000 mg | ORAL_TABLET | Freq: Every day | ORAL | Status: AC
  Administered 2024-12-07 – 2024-12-20 (×14): 200 mg via ORAL
  Filled 2024-12-07 (×16): qty 2

## 2024-12-07 MED ORDER — OXYCODONE HCL 5 MG PO TABS
5.0000 mg | ORAL_TABLET | Freq: Four times a day (QID) | ORAL | Status: AC | PRN
  Administered 2024-12-19: 5 mg via ORAL
  Filled 2024-12-07: qty 1

## 2024-12-07 MED ORDER — PERFLUTREN LIPID MICROSPHERE
1.0000 mL | INTRAVENOUS | Status: AC | PRN
  Administered 2024-12-07: 5 mL via INTRAVENOUS

## 2024-12-07 MED ORDER — FUROSEMIDE 10 MG/ML IJ SOLN
20.0000 mg | Freq: Once | INTRAMUSCULAR | Status: AC
  Administered 2024-12-07: 20 mg via INTRAVENOUS
  Filled 2024-12-07: qty 2
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
## 2024-12-07 NOTE — Plan of Care (Signed)
" °  Problem: Education: Goal: Knowledge of General Education information will improve Description: Including pain rating scale, medication(s)/side effects and non-pharmacologic comfort measures 12/07/2024 0748 by Durenda Lieu, RN Outcome: Progressing 12/07/2024 0725 by Durenda Lieu, RN Outcome: Progressing 12/07/2024 0722 by Durenda Lieu, RN Outcome: Progressing   Problem: Health Behavior/Discharge Planning: Goal: Ability to manage health-related needs will improve 12/07/2024 0748 by Durenda Lieu, RN Outcome: Progressing 12/07/2024 0725 by Durenda Lieu, RN Outcome: Progressing 12/07/2024 0722 by Durenda Lieu, RN Outcome: Progressing   Problem: Clinical Measurements: Goal: Ability to maintain clinical measurements within normal limits will improve 12/07/2024 0748 by Durenda Lieu, RN Outcome: Progressing 12/07/2024 0725 by Durenda Lieu, RN Outcome: Progressing 12/07/2024 0722 by Durenda Lieu, RN Outcome: Progressing Goal: Will remain free from infection 12/07/2024 0748 by Durenda Lieu, RN Outcome: Progressing 12/07/2024 0725 by Durenda Lieu, RN Outcome: Progressing 12/07/2024 0722 by Durenda Lieu, RN Outcome: Progressing Goal: Diagnostic test results will improve Outcome: Progressing Goal: Respiratory complications will improve Outcome: Progressing   "

## 2024-12-07 NOTE — Progress Notes (Signed)
 [facility] Progress Note Patient: Brandon [name] FMW:[id number] DOB: Jul 02, 1941  DOA: 12/06/2024 DOS: the patient was seen and examined on 12/07/2024  Brief Summary: Patient with PMH of COPD, HTN, HLD, AAA, depression, obesity presents to the hospital with complaints of progressively worsening cough and shortness of breath as well as confusion.  Significant events: 1/7 - 1/14 admitted for acute hypoxic respiratory failure secondary to COPD exacerbation.  Was able to be weaned down to room air at the time of the discharge. 1/22 presented due to worsening shortness of breath especially when lying down.  Admitted to the hospitalist service.  PCCM was consulted.  Lactic acid elevated. 1/23 echocardiogram EF 55 to 60%, no WMA, no valvular abnormality, RV normal.  Consults: PCCM   Assessment and plan: Acute hypoxic respiratory failure. Secondary to COPD exacerbation as well as acute on chronic diastolic CHF. Suspected pneumonia Patient was able to be weaned down to room air during his last admission when he was presented to the hospital with COPD exacerbation. Currently requiring heated high flow for oxygenation. Upon exam bilateral expiratory wheezing which improves with mucus clearance. Add flutter valve. Continue with current IV antibiotics of ceftriaxone  and azithromycin . Procalcitonin is negative. COVID-negative.  Flu negative.  RSV negative.  During his recent admission.  RVP negative during this admission. Appears to have significant volume overload upon my examination and responded well to IV diuresis. Will provide a low dose of IV Lasix  due to blood pressure and monitor response. Follow-up on blood cultures, urine antigens. Continue with steroids and nebulizer therapy. Echocardiogram shows preserved EF, no wall motion abnormality.  No valvular abnormality.  Oropharyngeal candidiasis. Initiating Diflucan  Most likely due to his recent steroid.  Most likely will worsen with  ongoing use of antibiotics and steroids.  History of SDH as well as ICH. No focal deficit. Monitor. Intermittent confusion noted by family in the setting of hypoxia.  HTN. On amlodipine . Given significant lower extremity edema would recommend to discontinue this medication upon discharge.  History of CAD. PAD AAA with repair. Currently no chest pain. Echocardiogram no wall motion abnormality. EKG nonischemic. Continue aspirin  and.  Leukocytosis. This is mostly steroid-induced. For now we will monitor.  Lactic acidosis. Likely due to increased work of breathing. Will recheck levels although currently no evidence of severe sepsis. Check LFT as well.  Hard of hearing. Noted.  Obesity Class 2 Body mass index is 36.82 kg/m.  Placing the pt at higher risk of poor outcomes.   Code Status: Full Code     DVT Prophylaxis: heparin  injection 5,000 Units Start: 12/06/24 1400 Place TED hose Start: 12/06/24 0813 SCDs Start: 12/06/24 0646  Data review I have Reviewed nursing notes, Vitals, and Lab results. Since last encounter, pertinent lab results CBC and BMP   . I have ordered test including CBC and BMP  .   Physical exam. Vitals:   12/07/24 0813 12/07/24 1141 12/07/24 1509 12/07/24 1605  BP: 109/67   111/72  Pulse: 70 76 73 68  Resp: 20 20 (!) 22 19  Temp: 98.2 F (36.8 C) 97.6 F (36.4 C)  98 F (36.7 C)  TempSrc: Oral Oral  Oral  SpO2: 94% 96% 94% 92%  Weight:      Height:        General: in Mild distress, No Rash Cardiovascular: S1 and S2 Present, No Murmur Respiratory: Increased respiratory effort, Bilateral Air entry present.  Bilateral crackles, No wheezes Abdomen: Bowel Sound present, No tenderness Extremities: Bilateral edema  Neuro: Alert and oriented x3, no new focal deficit, no asterixis  Subjective: Feeling better.  Ongoing cough ongoing shortness of breath.  No nausea no vomiting.  No chest pain.  Still has significant swelling of his  legs.  Family Communication: Family at bedside.  Disposition Plan: Status is: Inpatient Remains inpatient appropriate because: Needing IV diuresis and high level of oxygen.   Planned Discharge Destination: TBD Diet: Diet Order             Diet Heart Fluid consistency: Thin  Diet effective now                   MEDICATIONS: Scheduled Meds:  aspirin   81 mg Oral Daily   atorvastatin   10 mg Oral Daily   azithromycin   500 mg Oral Daily   budesonide  (PULMICORT ) nebulizer solution  0.25 mg Nebulization BID   citalopram   20 mg Oral Daily   guaiFENesin   10 mL Oral Q8H   heparin   5,000 Units Subcutaneous Q8H   lactobacillus  1 g Oral TID WC   levalbuterol   1.25 mg Nebulization Q6H   methylPREDNISolone  (SOLU-MEDROL ) injection  40 mg Intravenous Q12H   pantoprazole   40 mg Oral Daily   sodium chloride  flush  3 mL Intravenous Q12H   sodium chloride  HYPERTONIC  4 mL Nebulization BID   Continuous Infusions:  cefTRIAXone  (ROCEPHIN )  IV 1 g (12/07/24 0826)   PRN Meds:.acetaminophen  **OR** acetaminophen , albuterol , bisacodyl , ondansetron  **OR** ondansetron  (ZOFRAN ) IV, oxyCODONE , senna-docusate, traZODone   Author: Yetta Blanch, MD  [facility] 12/07/2024  7:13 PM Between 7PM-7AM, please contact night-coverage, check [email or web address] for on call.

## 2024-12-07 NOTE — Plan of Care (Signed)
" °  Problem: Education: Goal: Knowledge of General Education information will improve Description: Including pain rating scale, medication(s)/side effects and non-pharmacologic comfort measures 12/07/2024 0725 by Durenda Lieu, RN Outcome: Progressing 12/07/2024 0722 by Durenda Lieu, RN Outcome: Progressing   Problem: Health Behavior/Discharge Planning: Goal: Ability to manage health-related needs will improve 12/07/2024 0725 by Durenda Lieu, RN Outcome: Progressing 12/07/2024 0722 by Durenda Lieu, RN Outcome: Progressing   Problem: Clinical Measurements: Goal: Ability to maintain clinical measurements within normal limits will improve 12/07/2024 0725 by Durenda Lieu, RN Outcome: Progressing 12/07/2024 0722 by Durenda Lieu, RN Outcome: Progressing Goal: Will remain free from infection 12/07/2024 0725 by Durenda Lieu, RN Outcome: Progressing 12/07/2024 0722 by Durenda Lieu, RN Outcome: Progressing Goal: Diagnostic test results will improve Outcome: Progressing   "

## 2024-12-07 NOTE — Evaluation (Signed)
 Physical Therapy Evaluation Patient Details Name: Brandon [name] MRN: [id number] DOB: 01/15/1941 Today's Date: 12/07/2024  History of Present Illness  84 y.o male presents to [facility] on 1/22 with sudden onset SOB. Currently on 25L HHFNC. PMH includes: HTN, HLD, AAA s/p repair 2013, COPD, and PVD.  Clinical Impression  Pt is currently mobilizing below his baseline due to significant SOB. PTA pt was independent with mobility with walker and ADLs. Pt is on 50L O2 via HHFNC and appears SOB throughout ambulation. Pt requires minA for bed mobility for trunk assist upon sitting EOB and is able to reposition self independently once EOB. Pt unable to clear hips from low bed height for STS but achieves full stance from elevated surface with modA. Pt able to march in place and balance improves with repositioning of feet. Pt takes lateral steps along the length of the bed but requires seated rest break due to SOB. Further mobility not completed due to level of fatigue. Pt would benefit from continued PT services focused on bed mobility, balance, transfers, and progressing gait. <3hrs rehab recommended at this time, however, family prefers [facility] PT and pt likely to progress quickly with improved respiratory status.         If plan is discharge home, recommend the following: A little help with walking and/or transfers;A little help with bathing/dressing/bathroom;Assistance with cooking/housework;Assist for transportation;Help with stairs or ramp for entrance   Can travel by private vehicle   No (Oxygen requirements)    Equipment Recommendations Rolling walker (2 wheels);BSC/3in1  Recommendations for Other Services       Functional Status Assessment Patient has had a recent decline in their functional status and demonstrates the ability to make significant improvements in function in a reasonable and predictable amount of time.     Precautions / Restrictions Precautions Precautions: Fall Recall of  Precautions/Restrictions: Intact Precaution/Restrictions Comments: Monitor O2, on 50L HHFNC Restrictions Weight Bearing Restrictions Per Provider Order: No      Mobility  Bed Mobility Overal bed mobility: Needs Assistance Bed Mobility: Supine to Sit, Sit to Supine     Supine to sit: Min assist Sit to supine: Contact guard assist   General bed mobility comments: Does well bringing B LE over EOB, assist needed to bring trunk upright, hand held assist provided. Able to reposition self EOB. Increased SOB with position changes.    Transfers Overall transfer level: Needs assistance Equipment used: Rolling walker (2 wheels) Transfers: Sit to/from Stand Sit to Stand: Mod assist           General transfer comment: Pt with difficulty clearing hips from lowest bed height, improved with elevated surface. Assist needed to balance in standing, improved with repositioning of feet.    Ambulation/Gait Ambulation/Gait assistance: Min assist Gait Distance (Feet): 2 Feet Assistive device: Rolling walker (2 wheels) Gait Pattern/deviations: Step-to pattern   Gait velocity interpretation: <1.31 ft/sec, indicative of household ambulator   General Gait Details: Lateral steps taken EOB, pt does well managing walker. Fatigues quickly and has increased SOB with activity, encouraged to take seated rest break, pt declined further mobility at this time.  Stairs            Wheelchair Mobility     Tilt Bed    Modified Rankin (Stroke Patients Only)       Balance Overall balance assessment: Needs assistance Sitting-balance support: No upper extremity supported, Feet supported Sitting balance-Leahy Scale: Good Sitting balance - Comments: No sitting balance concerns.   Standing balance support:  Bilateral upper extremity supported, During functional activity, Reliant on assistive device for balance Standing balance-Leahy Scale: Poor Standing balance comment: Pt reliant on walker and minA  external support to maintain balance in standing.                             Pertinent Vitals/Pain Pain Assessment Pain Assessment: No/denies pain    Home Living Family/patient expects to be discharged to:: Private residence Living Arrangements: Spouse/significant other Available Help at Discharge: Family;Available 24 hours/day Type of Home: House Home Access: Stairs to enter Entrance Stairs-Rails: Can reach both Entrance Stairs-Number of Steps: 3   Home Layout: One level Home Equipment: Shower seat - built in      Prior Function Prior Level of Function : Independent/Modified Independent;Driving             Mobility Comments: Prior to previous admission earlier this month: ind, no falls, no dme ADLs Comments: Prior to previous admission earlier this month: ind, drives, retired.     Extremity/Trunk Assessment   Upper Extremity Assessment Upper Extremity Assessment: Defer to OT evaluation    Lower Extremity Assessment Lower Extremity Assessment: Overall WFL for tasks assessed;Generalized weakness    Cervical / Trunk Assessment Cervical / Trunk Assessment: Kyphotic  Communication   Communication Communication: Impaired Factors Affecting Communication: Hearing impaired    Cognition Arousal: Alert Behavior During Therapy: WFL for tasks assessed/performed   PT - Cognitive impairments: No apparent impairments                         Following commands: Intact       Cueing Cueing Techniques: Verbal cues, Tactile cues, Visual cues     General Comments General comments (skin integrity, edema, etc.): Pt satting >88% on 50L HHFNC. Decreases to mid 80's with activity but has poor pleth. No significant skin abnormalities noted.    Exercises     Assessment/Plan    PT Assessment Patient needs continued PT services  PT Problem List Decreased strength;Cardiopulmonary status limiting activity;Decreased mobility;Decreased activity  tolerance;Decreased balance;Decreased knowledge of use of DME       PT Treatment Interventions DME instruction;Therapeutic exercise;Gait training;Balance training;Stair training;Neuromuscular re-education;Functional mobility training;Therapeutic activities;Patient/family education    PT Goals (Current goals can be found in the Care Plan section)  Acute Rehab PT Goals Patient Stated Goal: Get well, improve strength and endurance again PT Goal Formulation: With patient/family Time For Goal Achievement: 12/21/24 Potential to Achieve Goals: Good    Frequency Min 2X/week     Co-evaluation               AM-PAC PT 6 Clicks Mobility  Outcome Measure Help needed turning from your back to your side while in a flat bed without using bedrails?: A Little Help needed moving from lying on your back to sitting on the side of a flat bed without using bedrails?: A Little Help needed moving to and from a bed to a chair (including a wheelchair)?: A Lot Help needed standing up from a chair using your arms (e.g., wheelchair or bedside chair)?: A Lot Help needed to walk in hospital room?: Total Help needed climbing 3-5 steps with a railing? : Total 6 Click Score: 12    End of Session Equipment Utilized During Treatment: Gait belt;Oxygen Activity Tolerance: Patient limited by fatigue (Patient limited by  SOB) Patient left: in bed;with call bell/phone within reach;with bed alarm set;with family/visitor present  Nurse Communication: Mobility status PT Visit Diagnosis: Unsteadiness on feet (R26.81);Muscle weakness (generalized) (M62.81)    Time: 8898-8871 PT Time Calculation (min) (ACUTE ONLY): 27 min   Charges:   PT Evaluation $PT Eval Moderate Complexity: 1 Mod PT Treatments $Therapeutic Activity: 8-22 mins PT General Charges $$ ACUTE PT VISIT: 1 Visit         Sabra Morel, PT, DPT  Acute Rehabilitation Services         Office: [phone]     Sabra MARLA Morel 12/07/2024, 4:19  PM

## 2024-12-07 NOTE — Plan of Care (Signed)

## 2024-12-08 DIAGNOSIS — Z87891 Personal history of nicotine dependence: Secondary | ICD-10-CM | POA: Diagnosis not present

## 2024-12-08 DIAGNOSIS — J441 Chronic obstructive pulmonary disease with (acute) exacerbation: Secondary | ICD-10-CM | POA: Diagnosis not present

## 2024-12-08 DIAGNOSIS — J9601 Acute respiratory failure with hypoxia: Secondary | ICD-10-CM | POA: Diagnosis not present

## 2024-12-08 LAB — CBC
HCT: 40.9 % (ref 39.0–52.0)
Hemoglobin: 14 g/dL (ref 13.0–17.0)
MCH: 33.5 pg (ref 26.0–34.0)
MCHC: 34.2 g/dL (ref 30.0–36.0)
MCV: 97.8 fL (ref 80.0–100.0)
Platelets: 233 10*3/uL (ref 150–400)
RBC: 4.18 MIL/uL — ABNORMAL LOW (ref 4.22–5.81)
RDW: 12.7 % (ref 11.5–15.5)
WBC: 18.9 10*3/uL — ABNORMAL HIGH (ref 4.0–10.5)
nRBC: 0 % (ref 0.0–0.2)

## 2024-12-08 LAB — BASIC METABOLIC PANEL WITH GFR
Anion gap: 9 (ref 5–15)
BUN: 23 mg/dL (ref 8–23)
CO2: 25 mmol/L (ref 22–32)
Calcium: 8.5 mg/dL — ABNORMAL LOW (ref 8.9–10.3)
Chloride: 100 mmol/L (ref 98–111)
Creatinine, Ser: 0.77 mg/dL (ref 0.61–1.24)
GFR, Estimated: >60 mL/min
Glucose, Bld: 127 mg/dL — ABNORMAL HIGH (ref 70–99)
Potassium: 4.7 mmol/L (ref 3.5–5.1)
Sodium: 134 mmol/L — ABNORMAL LOW (ref 135–145)

## 2024-12-08 LAB — HEPATIC FUNCTION PANEL
ALT: 102 U/L — ABNORMAL HIGH (ref 0–44)
AST: 48 U/L — ABNORMAL HIGH (ref 15–41)
Albumin: 2.9 g/dL — ABNORMAL LOW (ref 3.5–5.0)
Alkaline Phosphatase: 85 U/L (ref 38–126)
Bilirubin, Direct: 0.2 mg/dL (ref 0.0–0.2)
Indirect Bilirubin: 0.3 mg/dL (ref 0.3–0.9)
Total Bilirubin: 0.5 mg/dL (ref 0.0–1.2)
Total Protein: 5.9 g/dL — ABNORMAL LOW (ref 6.5–8.1)

## 2024-12-08 LAB — GLUCOSE, CAPILLARY: Glucose-Capillary: 124 mg/dL — ABNORMAL HIGH (ref 70–99)

## 2024-12-08 LAB — LACTIC ACID, PLASMA: Lactic Acid, Venous: 1.6 mmol/L (ref 0.5–1.9)

## 2024-12-08 MED ORDER — BENZONATATE 100 MG PO CAPS
100.0000 mg | ORAL_CAPSULE | Freq: Three times a day (TID) | ORAL | Status: AC
  Administered 2024-12-08 – 2024-12-21 (×41): 100 mg via ORAL
  Filled 2024-12-08 (×42): qty 1

## 2024-12-08 MED ORDER — DEXTROMETHORPHAN POLISTIREX ER 30 MG/5ML PO SUER
30.0000 mg | Freq: Two times a day (BID) | ORAL | Status: AC
  Administered 2024-12-08 – 2024-12-21 (×27): 30 mg via ORAL
  Filled 2024-12-08 (×28): qty 5

## 2024-12-08 MED ORDER — GUAIFENESIN ER 600 MG PO TB12
600.0000 mg | ORAL_TABLET | Freq: Two times a day (BID) | ORAL | Status: AC
  Administered 2024-12-08 – 2024-12-21 (×28): 600 mg via ORAL
  Filled 2024-12-08 (×28): qty 1

## 2024-12-08 MED ORDER — FUROSEMIDE 10 MG/ML IJ SOLN
20.0000 mg | Freq: Once | INTRAMUSCULAR | Status: AC
  Administered 2024-12-08: 20 mg via INTRAVENOUS
  Filled 2024-12-08: qty 2

## 2024-12-08 MED ORDER — IPRATROPIUM BROMIDE 0.02 % IN SOLN
0.5000 mg | Freq: Four times a day (QID) | RESPIRATORY_TRACT | Status: DC
  Administered 2024-12-08 – 2024-12-10 (×8): 0.5 mg via RESPIRATORY_TRACT
  Filled 2024-12-08 (×8): qty 2.5

## 2024-12-08 NOTE — Plan of Care (Signed)

## 2024-12-08 NOTE — Consult Note (Signed)
 "  NAME:  Brandon [name], MRN:  [id number], DOB:  August 05, 1941, LOS: 2 ADMISSION DATE:  12/06/2024, CONSULTATION DATE:  12/08/24 REFERRING MD:  Dr. Marcene, CHIEF COMPLAINT:  COPD exacerbation   History of Present Illness:   Brandon [name] is an 84 y/o M with a PMH significant for HTN, HLD, AAA w/ repair, Grade II COPD (FEV1 1.59 L, 55% predicted) who presents with shortness of breath, cough and acute hypoxic respiratory failure. Pulmonology consulted for evaluation and management of the latter.  He has a history of COPD, grade two, with multiple admissions for exacerbations. Shortness of breath started suddenly this morning around 1:30 AM. A similar episode occurred two weeks ago, but he thought he was improving until the current episode.  He denies using home oxygen and was not discharged with oxygen after previous hospitalizations. He experiences shortness of breath sometimes when lying flat and has noticed swelling in his feet and legs, which is worse than usual. No fever, chills, night sweats, or coughing up sputum or blood.  He uses an inhaler, Breztri , two puffs in the morning and two puffs in the evening, and rinses his mouth after use. He also has a nebulizer at home.  He quit smoking cigarettes 13 years ago. Pertinent  Medical History   Past Medical History:  Diagnosis Date   AAA (abdominal aortic aneurysm)    COPD (chronic obstructive pulmonary disease) (HCC)    DIC (disseminated intravascular coagulation) 09/09/2012   Hyperlipidemia    Hypertension    Leg wound, left 12/12/2013   Peripheral vascular disease    Respiratory failure, post-operative 09/09/2012   Shortness of breath    Significant Hospital Events: Including procedures, antibiotic start and stop dates in addition to other pertinent events   /7 - 1/14 admitted for acute hypoxic respiratory failure secondary to COPD exacerbation.  Was able to be weaned down to room air at the time of the discharge. 1/22 presented due  to worsening shortness of breath especially when lying down.  Admitted to the hospitalist service.  PCCM was consulted.  Lactic acid elevated. 1/23 echocardiogram EF 55 to 60%, no WMA, no valvular abnormality, RV normal. Interim History / Subjective:    1/24 - on HHFNC. 40% fio2 and Flow rate 40. Says he is better.  ECHO yesterday normal  Objective    Blood pressure 101/70, pulse 78, temperature 98.1 F (36.7 C), temperature source Oral, resp. rate 17, height 5' 11 (1.803 m), weight 119.6 kg, SpO2 92%.    FiO2 (%):  [40 %-50 %] 40 %   Intake/Output Summary (Last 24 hours) at 12/08/2024 1544 Last data filed at 12/08/2024 1400 Gross per 24 hour  Intake 0 ml  Output 2700 ml  Net -2700 ml   Filed Weights   12/06/24 0501 12/08/24 0500  Weight: 119.7 kg 119.6 kg    Examination:  General Appearance:  Looks stable but unwell with HHFNC Head:  Normocephalic, without obvious abnormality, atraumatic Eyes:  PERRL - yes, conjunctiva/corneas - mudd     Ears:  Normal external ear canals, both ears Nose:  G tube - no but has HHFNC Throat:  ETT TUBE - no , OG tube - no Neck:  Supple,  No enlargement/tenderness/nodules Lungs: Clear to auscultation bilaterally,  NO WHEEZE Heart:  S1 and S2 normal, no murmur, CVP - no.  Pressors - no Abdomen:  Soft, no masses, no organomegaly Genitalia / Rectal:  Not done Extremities:  Extremities- intact Skin:  ntact in exposed areas .  Sacral area - not examined Neurologic:  Sedation - none -> RASS - +1 . Moves all 4s - yes. CAM-ICU - neg . Orientation - x3+     Latest Ref Rng & Units 10/16/2024    3:27 PM  PFT Results  FVC-Pre L 2.85   FVC-Predicted Pre % 69   FVC-Post L 2.69   FVC-Predicted Post % 66   Pre FEV1/FVC % % 56   Post FEV1/FCV % % 58   FEV1-Pre L 1.59   FEV1-Predicted Pre % 55   FEV1-Post L 1.57   DLCO uncorrected ml/min/mmHg 14.09   DLCO UNC% % 57   DLCO corrected ml/min/mmHg 14.09   DLCO COR %Predicted % 57   DLVA Predicted % 82      LABS    PULMONARY No results for input(s): PHART, PCO2ART, PO2ART, HCO3, TCO2, O2SAT in the last 168 hours.  Invalid input(s): PCO2, PO2  CBC Recent Labs  Lab 12/06/24 0514 12/07/24 0532 12/08/24 0533  HGB 15.0 13.5 14.0  HCT 43.8 39.3 40.9  WBC 18.2* 24.5* 18.9*  PLT 233 224 233    COAGULATION No results for input(s): INR in the last 168 hours.  CARDIAC  No results for input(s): TROPONINI in the last 168 hours. Recent Labs  Lab 12/06/24 0514 12/06/24 0647  PROBNP 136.0 150.0     CHEMISTRY Recent Labs  Lab 12/06/24 0514 12/06/24 0647 12/07/24 0532 12/08/24 0533  NA 134*  --  134* 134*  K 4.0  --  4.7 4.7  CL 100  --  99 100  CO2 25  --  25 25  GLUCOSE 109*  --  116* 127*  BUN 14  --  19 23  CREATININE 0.73  --  0.80 0.77  CALCIUM  8.2*  --  8.5* 8.5*  MG  --  2.1  --   --   PHOS  --  2.8  --   --    Estimated Creatinine Clearance: 95.3 mL/min (by C-G formula based on SCr of 0.77 mg/dL).   LIVER Recent Labs  Lab 12/08/24 0533  AST 48*  ALT 102*  ALKPHOS 85  BILITOT 0.5  PROT 5.9*  ALBUMIN  2.9*     INFECTIOUS Recent Labs  Lab 12/06/24 0647 12/06/24 0651 12/06/24 1959 12/08/24 0533  LATICACIDVEN  --  3.1* 4.0* 1.6  PROCALCITON <0.10  --   --   --      ENDOCRINE CBG (last 3)  Recent Labs    12/07/24 0825 12/08/24 0759  GLUCAP 114* 124*         IMAGING x48h  - image(s) personally visualized  -   highlighted in bold ECHOCARDIOGRAM COMPLETE Result Date: 12/07/2024    ECHOCARDIOGRAM REPORT   Patient Name:   Brandon [name] Date of Exam: 12/07/2024 Medical Rec #:  [id number]       Height:       71.0 in Accession #:    [id number]      Weight:       264.0 lb Date of Birth:  1941/04/13       BSA:          2.373 m Patient Age:    83 years        BP:           109/67 mmHg Patient Gender: M               HR:           76 bpm.  Exam Location:  Inpatient Procedure: 2D Echo, Cardiac Doppler, Color Doppler and  Intracardiac            Opacification Agent (Both Spectral and Color Flow Doppler were            utilized during procedure). Indications:    CHF- Acute Diastolic  History:        Patient has prior history of Echocardiogram examinations, most                 recent 08/06/2022. COPD, Signs/Symptoms:Dyspnea; Risk                 Factors:Dyslipidemia and Hypertension.  Sonographer:    Sherlean Dubin Referring Phys: [id number] PAULA SOUTHERLY  Sonographer Comments: Technically difficult study due to poor echo windows. Image acquisition challenging due to patient body habitus, Image acquisition challenging due to respiratory motion and Image acquisition challenging due to COPD. IMPRESSIONS  1. Left ventricular ejection fraction, by estimation, is 55 to 60%. The left ventricle has normal function. The left ventricle has no regional wall motion abnormalities. There is moderate left ventricular hypertrophy. Left ventricular diastolic parameters are indeterminate.  2. Right ventricular systolic function is normal. The right ventricular size is normal.  3. Left atrial size was mildly dilated.  4. Right atrial size was mildly dilated.  5. The mitral valve was not well visualized. No evidence of mitral valve regurgitation. No evidence of mitral stenosis. Moderate to severe mitral annular calcification.  6. The aortic valve was not well visualized. Aortic valve regurgitation is not visualized. No aortic stenosis is present. Comparison(s): A prior study was performed on 08/06/2022. There is now moderate LVH and mild biatrial enlargement. No other significant changes. FINDINGS  Left Ventricle: Left ventricular ejection fraction, by estimation, is 55 to 60%. The left ventricle has normal function. The left ventricle has no regional wall motion abnormalities. Definity  contrast agent was given IV to delineate the left ventricular  endocardial borders. The left ventricular internal cavity size was normal in size. There is moderate left  ventricular hypertrophy. Left ventricular diastolic parameters are indeterminate. Right Ventricle: The right ventricular size is normal. Right vetricular wall thickness was not well visualized. Right ventricular systolic function is normal. Left Atrium: Left atrial size was mildly dilated. Right Atrium: Right atrial size was mildly dilated. Pericardium: There is no evidence of pericardial effusion. Presence of epicardial fat layer. Mitral Valve: The mitral valve was not well visualized. Moderate to severe mitral annular calcification. No evidence of mitral valve regurgitation. No evidence of mitral valve stenosis. Tricuspid Valve: The tricuspid valve is not well visualized. Tricuspid valve regurgitation is not demonstrated. No evidence of tricuspid stenosis. Aortic Valve: The aortic valve was not well visualized. Aortic valve regurgitation is not visualized. No aortic stenosis is present. Aortic valve mean gradient measures 6.0 mmHg. Aortic valve peak gradient measures 12.2 mmHg. Aortic valve area, by VTI measures 2.53 cm. Pulmonic Valve: The pulmonic valve was not well visualized. Pulmonic valve regurgitation is not visualized. No evidence of pulmonic stenosis. Aorta: The aortic root is normal in size and structure. Venous: The inferior vena cava was not well visualized. IAS/Shunts: The interatrial septum was not well visualized.  LEFT VENTRICLE PLAX 2D LVIDd:         4.80 cm   Diastology LVIDs:         3.40 cm   LV e' medial:    7.18 cm/s LV PW:         1.20 cm  LV E/e' medial:  10.1 LV IVS:        1.20 cm   LV e' lateral:   5.44 cm/s LVOT diam:     2.00 cm   LV E/e' lateral: 13.3 LV SV:         82 LV SV Index:   34 LVOT Area:     3.14 cm  RIGHT VENTRICLE             IVC RV Basal diam:  3.70 cm     IVC diam: 2.10 cm RV Mid diam:    3.50 cm RV S prime:     10.00 cm/s TAPSE (M-mode): 2.1 cm LEFT ATRIUM             Index        RIGHT ATRIUM           Index LA diam:        3.60 cm 1.52 cm/m   RA Area:     21.00  cm LA Vol (A2C):   53.9 ml 22.72 ml/m  RA Volume:   65.40 ml  27.56 ml/m LA Vol (A4C):   55.7 ml 23.48 ml/m LA Biplane Vol: 60.0 ml 25.29 ml/m  AORTIC VALVE AV Area (Vmax):    2.57 cm AV Area (Vmean):   2.66 cm AV Area (VTI):     2.53 cm AV Vmax:           175.00 cm/s AV Vmean:          113.500 cm/s AV VTI:            0.323 m AV Peak Grad:      12.2 mmHg AV Mean Grad:      6.0 mmHg LVOT Vmax:         143.00 cm/s LVOT Vmean:        96.200 cm/s LVOT VTI:          0.260 m LVOT/AV VTI ratio: 0.80  AORTA Ao Root diam: 3.50 cm MITRAL VALVE               TRICUSPID VALVE MV Area (PHT): 3.99 cm    TR Peak grad:   6.5 mmHg MV Decel Time: 190 msec    TR Vmax:        127.00 cm/s MV E velocity: 72.50 cm/s MV A velocity: 76.20 cm/s  SHUNTS MV E/A ratio:  0.95        Systemic VTI:  0.26 m                            Systemic Diam: 2.00 cm Emeline Calender Electronically signed by Emeline Calender Signature Date/Time: 12/07/2024/11:33:11 AM    Final       Resolved problem list   Assessment and Plan  BAseline COPD Gold stge 2: No home oxygen use or recent smoking Acute exacerbation of COPD with severe hypoxemic resp failure that is out of propoirtion  PLAN - Cintinue pulmonary treatment with bronchodilators, hypertonic saline, and chest physiotherapy.  - Arrange pulmonary follow-up to discuss therapies to prevent exacerbations, including chronic macrolide therapies, Roflumilast, or biologics.  -Acute resp failure -0hypoxemia  Plan  HHFNC to continue Get Bubble echo     SIGNATURE    Dr. Dorethia Cave, M.D., F.C.C.P,  Pulmonary and Critical Care Medicine Staff Physician, [facility] Center Director - Interstitial Lung Disease  Program  Pulmonary Fibrosis [facility] [place]  Pulmonary [place], [place], [address]   Pager: [phone], If no answer  -> Check AMION or Try [phone] Telephone (clinical office): [phone] Telephone (research): [phone]  3:59  PM 12/08/2024   Patient Lines/Drains/Airways Status     Active Line/Drains/Airways     Name Placement date Placement time Site Days   Peripheral IV 18 G Left Antecubital --  --  Antecubital  --   Peripheral IV 12/06/24 20 G Right Antecubital 12/06/24  0639  Antecubital  2           "

## 2024-12-08 NOTE — Progress Notes (Signed)
 [facility] Progress Note Patient: Brandon [name] FMW:[id number] DOB: [birth year]  DOA: 12/06/2024 DOS: the patient was seen and examined on 12/08/2024  Brief Summary: Patient with PMH of COPD, HTN, HLD, AAA, depression, obesity presents to the hospital with complaints of progressively worsening cough and shortness of breath as well as confusion.  Significant events: 1/7 - 1/14 admitted for acute hypoxic respiratory failure secondary to COPD exacerbation.  Was able to be weaned down to room air at the time of the discharge. 1/22 presented due to worsening shortness of breath especially when lying down.  Admitted to the hospitalist service.  PCCM was consulted.  Lactic acid elevated. 1/23 echocardiogram EF 55 to 60%, no WMA, no valvular abnormality, RV normal.  Consults: PCCM   Assessment and plan: Acute hypoxic respiratory failure. Secondary to COPD exacerbation as well as acute on chronic diastolic CHF. Suspected pneumonia Patient was able to be weaned down to room air during his last admission when he was presented to the hospital with COPD exacerbation. Currently requiring heated high flow for oxygenation. Upon exam bilateral expiratory wheezing which improves with mucus clearance. Add flutter valve. Continue with current IV antibiotics of ceftriaxone  and azithromycin . Procalcitonin is negative. COVID-negative.  Flu negative.  RSV negative.  During his recent admission.  RVP negative during this admission. Continue with IV diuresis. Follow-up on blood cultures so far negative.  MRSA PCR negative. Continue with steroids and nebulizer therapy. Echocardiogram shows preserved EF, no wall motion abnormality.  No valvular abnormality.  Oropharyngeal candidiasis. Initiating Diflucan   Most likely due to his recent steroid.  Most likely will worsen with ongoing use of antibiotics and steroids.  History of SDH as well as ICH. No focal deficit. Monitor. Intermittent confusion noted  by family in the setting of hypoxia.  HTN. On amlodipine . Given significant lower extremity edema would recommend to discontinue this medication upon discharge.  History of CAD. PAD AAA with repair. Currently no chest pain. Echocardiogram no wall motion abnormality. EKG nonischemic. Continue aspirin   Leukocytosis. This is mostly steroid-induced. For now we will monitor.  Lactic acidosis. Likely due to increased work of breathing. Resolved. LFT normal.  Hard of hearing. Noted.  Obesity Class 2 Body mass index is 36.77 kg/m.  Placing the pt at higher risk of poor outcomes.   Code Status: Full Code     DVT Prophylaxis: heparin  injection 5,000 Units Start: 12/06/24 1400 Place TED hose Start: 12/06/24 0813 SCDs Start: 12/06/24 0646    Data review I have Reviewed nursing notes, Vitals, and Lab results. Since last encounter, pertinent lab results CBC and BMP   . I have ordered test including CBC and BMP  . I have discussed pt's care plan and test results with pulmonary  .  Ordered repeat chest x-ray for tomorrow. Physical exam. Vitals:   12/08/24 0800 12/08/24 1200 12/08/24 1248 12/08/24 1600  BP: 108/65 101/70  106/62  Pulse: 66 78  (!) 59  Resp: 17  17   Temp: (!) 97.5 F (36.4 C) 98.1 F (36.7 C)  (!) 97.3 F (36.3 C)  TempSrc: Oral Oral  Axillary  SpO2: 90% 92%  93%  Weight:      Height:        General: in Mild distress, No Rash Cardiovascular: S1 and S2 Present, No Murmur Respiratory: Good respiratory effort, Bilateral Air entry present.  Basal crackles, No wheezes Abdomen: Bowel Sound present, No tenderness Extremities: Improving bilateral edema Neuro: Alert and oriented x3, no new focal deficit  Subjective: Feeling better.  No nausea no vomiting.  Oral intake adequate.  Still has some cough.  Family Communication: Family at bedside  Disposition Plan: Status is: Inpatient Remains inpatient appropriate because: Monitor for improvement in volume  status and respiratory status   Planned Discharge Destination: TBD Diet: Diet Order             Diet Heart Fluid consistency: Thin  Diet effective now                   MEDICATIONS: Scheduled Meds:  aspirin   81 mg Oral Daily   azithromycin   500 mg Oral Daily   benzonatate   100 mg Oral TID   budesonide  (PULMICORT ) nebulizer solution  0.25 mg Nebulization BID   citalopram   20 mg Oral Daily   dextromethorphan   30 mg Oral BID   fluconazole   200 mg Oral Daily   guaiFENesin   600 mg Oral BID   heparin   5,000 Units Subcutaneous Q8H   ipratropium  0.5 mg Nebulization Q6H   lactobacillus  1 g Oral TID WC   levalbuterol   1.25 mg Nebulization Q6H   methylPREDNISolone  (SOLU-MEDROL ) injection  40 mg Intravenous Q12H   pantoprazole   40 mg Oral Daily   sodium chloride  flush  3 mL Intravenous Q12H   sodium chloride  HYPERTONIC  4 mL Nebulization BID   Continuous Infusions:  cefTRIAXone  (ROCEPHIN )  IV 1 g (12/08/24 0937)   PRN Meds:.acetaminophen  **OR** acetaminophen , albuterol , bisacodyl , ondansetron  **OR** ondansetron  (ZOFRAN ) IV, oxyCODONE , senna-docusate, traZODone   Author: Yetta Blanch, MD  [facility] 12/08/2024  6:31 PM Between 7PM-7AM, please contact night-coverage, check [email or web address] for on call.

## 2024-12-08 NOTE — Progress Notes (Signed)
 Physical Therapy Treatment Patient Details Name: Brandon [name] MRN: [id number] DOB: Apr 05, 1941 Today's Date: 12/08/2024   History of Present Illness 84 y.o male presents to [facility] on 1/22 with sudden onset SOB. Currently on 40L HHFNC. PMH includes: HTN, HLD, AAA s/p repair 2013, COPD, and PVD.    PT Comments  Pt continues to require significant amounts of O2 at rest and during functional mobility to maintain >88% saturation. Pt currently on 40L via HHFNC. Pt requires hand held assist to pull trunk to upright position with cues to prevent pt from holding breath during transfer. Pt demonstrates improvements in STS strength, requiring minA initially to stand and improving to CGA with additional reps. Pt tolerates 3 bouts of short distance forward and backward ambulation with walker though becomes increasingly SOB with activity. Seated exercises performed prior to mobility and pt tolerated well. Pt would benefit from continued PT services focused on bed mobility, balance, transfers, and progressing gait. <3hrs rehab recommended at this time, however, family prefers [facility] PT and pt likely to progress quickly with improved respiratory status.      If plan is discharge home, recommend the following: A little help with walking and/or transfers;A little help with bathing/dressing/bathroom;Assistance with cooking/housework;Assist for transportation;Help with stairs or ramp for entrance;Direct supervision/assist for medications management   Can travel by private vehicle     No (Oxygen requirements)  Equipment Recommendations  Rolling walker (2 wheels);BSC/3in1    Recommendations for Other Services       Precautions / Restrictions Precautions Precautions: Fall Recall of Precautions/Restrictions: Intact Precaution/Restrictions Comments: Monitor O2, on 40L HHFNC Restrictions Weight Bearing Restrictions Per Provider Order: No     Mobility  Bed Mobility Overal bed mobility: Needs Assistance Bed Mobility:  Supine to Sit, Sit to Supine     Supine to sit: Min assist Sit to supine: Contact guard assist   General bed mobility comments: Light hand held asssit to bring trunk upright. Cues needed to prevent pt from holding breath during transfer. Does well respositioning EOB.    Transfers Overall transfer level: Needs assistance Equipment used: Rolling walker (2 wheels) Transfers: Sit to/from Stand Sit to Stand: Contact guard assist, Min assist           General transfer comment: MinA required for initial STS, improved to CGA with increased reps. Benefits from 3-count to initiate stance. Poor eccentric control when returning to sit. Increased SOB with transfers.    Ambulation/Gait Ambulation/Gait assistance: Contact guard assist Gait Distance (Feet): 4 Feet (4'x6) Assistive device: Rolling walker (2 wheels) Gait Pattern/deviations: Step-through pattern, Decreased stride length, Trunk flexed, Narrow base of support   Gait velocity interpretation: <1.31 ft/sec, indicative of household ambulator   General Gait Details: Pt ambulates forward and backward along the length of the bed with walker. Additional cues needed for stepping backwards for positioning. Tolerates 3 bouts without seated rest break but notes increased fatigue at end of session.   Stairs             Wheelchair Mobility     Tilt Bed    Modified Rankin (Stroke Patients Only)       Balance Overall balance assessment: Needs assistance Sitting-balance support: No upper extremity supported, Feet supported Sitting balance-Leahy Scale: Good Sitting balance - Comments: No sitting balance concerns.   Standing balance support: Bilateral upper extremity supported, During functional activity, Reliant on assistive device for balance Standing balance-Leahy Scale: Poor Standing balance comment: Pt reliant on walker and minA external support to maintain balance  in standing. No overt LOB occurs during session.                             Communication Communication Communication: Impaired Factors Affecting Communication: Hearing impaired  Cognition Arousal: Alert Behavior During Therapy: WFL for tasks assessed/performed   PT - Cognitive impairments: No apparent impairments                         Following commands: Intact      Cueing Cueing Techniques: Verbal cues, Tactile cues, Visual cues  Exercises General Exercises - Lower Extremity Ankle Circles/Pumps: AROM, Both, 10 reps, Seated Long Arc Quad: AROM, Both, 10 reps, Seated Hip ABduction/ADduction: AROM, Both, 10 reps, Seated Hip Flexion/Marching: AROM, Both, 10 reps, Seated    General Comments General comments (skin integrity, edema, etc.): Pt satting 87-91% on 40L via HHFNC. No new skin abnormalities noted.      Pertinent Vitals/Pain Pain Assessment Pain Assessment: No/denies pain    Home Living                          Prior Function            PT Goals (current goals can now be found in the care plan section) Acute Rehab PT Goals Patient Stated Goal: Get well, improve strength and endurance again PT Goal Formulation: With patient/family Time For Goal Achievement: 12/21/24 Potential to Achieve Goals: Good Progress towards PT goals: Progressing toward goals    Frequency    Min 2X/week      PT Plan      Co-evaluation              AM-PAC PT 6 Clicks Mobility   Outcome Measure  Help needed turning from your back to your side while in a flat bed without using bedrails?: A Little Help needed moving from lying on your back to sitting on the side of a flat bed without using bedrails?: A Little Help needed moving to and from a bed to a chair (including a wheelchair)?: A Little Help needed standing up from a chair using your arms (e.g., wheelchair or bedside chair)?: A Little Help needed to walk in hospital room?: Total Help needed climbing 3-5 steps with a railing? : Total 6 Click  Score: 14    End of Session Equipment Utilized During Treatment: Oxygen Activity Tolerance: Patient limited by fatigue (Pt limited by SOB) Patient left: in chair;with call bell/phone within reach;with chair alarm set Nurse Communication: Mobility status PT Visit Diagnosis: Unsteadiness on feet (R26.81);Muscle weakness (generalized) (M62.81)     Time: 8990-8961 PT Time Calculation (min) (ACUTE ONLY): 29 min  Charges:    $Gait Training: 8-22 mins $Therapeutic Exercise: 8-22 mins PT General Charges $$ ACUTE PT VISIT: 1 Visit                     Sabra Morel, PT, DPT  Acute Rehabilitation Services         Office: [phone]      Sabra MARLA Morel 12/08/2024, 11:53 AM

## 2024-12-09 ENCOUNTER — Inpatient Hospital Stay (HOSPITAL_COMMUNITY)

## 2024-12-09 DIAGNOSIS — Q2112 Patent foramen ovale: Secondary | ICD-10-CM | POA: Diagnosis not present

## 2024-12-09 DIAGNOSIS — J9601 Acute respiratory failure with hypoxia: Secondary | ICD-10-CM | POA: Diagnosis not present

## 2024-12-09 LAB — COMPREHENSIVE METABOLIC PANEL WITH GFR
ALT: 84 U/L — ABNORMAL HIGH (ref 0–44)
AST: 22 U/L (ref 15–41)
Albumin: 2.9 g/dL — ABNORMAL LOW (ref 3.5–5.0)
Alkaline Phosphatase: 83 U/L (ref 38–126)
Anion gap: 10 (ref 5–15)
BUN: 24 mg/dL — ABNORMAL HIGH (ref 8–23)
CO2: 26 mmol/L (ref 22–32)
Calcium: 8.5 mg/dL — ABNORMAL LOW (ref 8.9–10.3)
Chloride: 98 mmol/L (ref 98–111)
Creatinine, Ser: 0.75 mg/dL (ref 0.61–1.24)
GFR, Estimated: >60 mL/min
Glucose, Bld: 160 mg/dL — ABNORMAL HIGH (ref 70–99)
Potassium: 4.2 mmol/L (ref 3.5–5.1)
Sodium: 134 mmol/L — ABNORMAL LOW (ref 135–145)
Total Bilirubin: 0.4 mg/dL (ref 0.0–1.2)
Total Protein: 5.8 g/dL — ABNORMAL LOW (ref 6.5–8.1)

## 2024-12-09 LAB — MAGNESIUM: Magnesium: 2.3 mg/dL (ref 1.7–2.4)

## 2024-12-09 LAB — CBC
HCT: 38.5 % — ABNORMAL LOW (ref 39.0–52.0)
Hemoglobin: 13.6 g/dL (ref 13.0–17.0)
MCH: 33.3 pg (ref 26.0–34.0)
MCHC: 35.3 g/dL (ref 30.0–36.0)
MCV: 94.1 fL (ref 80.0–100.0)
Platelets: 258 10*3/uL (ref 150–400)
RBC: 4.09 MIL/uL — ABNORMAL LOW (ref 4.22–5.81)
RDW: 12.6 % (ref 11.5–15.5)
WBC: 18.6 10*3/uL — ABNORMAL HIGH (ref 4.0–10.5)
nRBC: 0 % (ref 0.0–0.2)

## 2024-12-09 LAB — ECHOCARDIOGRAM COMPLETE BUBBLE STUDY
Area-P 1/2: 3.34 cm2
Calc EF: 65.5 %
S' Lateral: 2.9 cm
Single Plane A2C EF: 64.5 %
Single Plane A4C EF: 66.3 %

## 2024-12-09 LAB — EXPECTORATED SPUTUM ASSESSMENT W GRAM STAIN, RFLX TO RESP C

## 2024-12-09 LAB — GLUCOSE, CAPILLARY: Glucose-Capillary: 192 mg/dL — ABNORMAL HIGH (ref 70–99)

## 2024-12-09 MED ORDER — FUROSEMIDE 10 MG/ML IJ SOLN
40.0000 mg | Freq: Once | INTRAMUSCULAR | Status: AC
  Administered 2024-12-09: 40 mg via INTRAVENOUS
  Filled 2024-12-09: qty 4

## 2024-12-09 MED ORDER — CEFUROXIME AXETIL 250 MG PO TABS
500.0000 mg | ORAL_TABLET | Freq: Two times a day (BID) | ORAL | Status: AC
  Administered 2024-12-09 – 2024-12-12 (×8): 500 mg via ORAL
  Filled 2024-12-09 (×9): qty 2

## 2024-12-09 MED ORDER — AZITHROMYCIN 500 MG PO TABS: 500.0000 mg | ORAL_TABLET | Freq: Every day | ORAL | Status: DC

## 2024-12-09 MED ORDER — SODIUM CHLORIDE 3 % IN NEBU
4.0000 mL | INHALATION_SOLUTION | Freq: Every day | RESPIRATORY_TRACT | Status: AC
  Administered 2024-12-10 – 2024-12-11 (×2): 4 mL via RESPIRATORY_TRACT
  Filled 2024-12-09 (×3): qty 4

## 2024-12-09 MED ORDER — AZITHROMYCIN 500 MG PO TABS
500.0000 mg | ORAL_TABLET | Freq: Every day | ORAL | Status: AC
  Administered 2024-12-09 – 2024-12-10 (×2): 500 mg via ORAL
  Filled 2024-12-09 (×2): qty 1

## 2024-12-09 MED ORDER — SENNOSIDES-DOCUSATE SODIUM 8.6-50 MG PO TABS
2.0000 | ORAL_TABLET | Freq: Two times a day (BID) | ORAL | Status: AC
  Administered 2024-12-09 – 2024-12-21 (×18): 2 via ORAL
  Filled 2024-12-09 (×25): qty 2

## 2024-12-09 NOTE — Progress Notes (Signed)
" °  Echocardiogram 2D Echocardiogram has been performed.  Gilbert Manolis 12/09/2024, 2:28 PM "

## 2024-12-09 NOTE — Progress Notes (Signed)
 Bubbl echo resuls pending Ccmw ill round again 12/10/24     SIGNATURE    Dr. Dorethia Cave, M.D., F.C.C.P,  Pulmonary and Critical Care Medicine Staff Physician, [facility] Director - Interstitial Lung Disease  Program  Pulmonary Fibrosis [facility] at [facility] [place], [place], [address]   Pager: [phone], If no answer  -> Check AMION or Try [phone] Telephone (clinical office): [phone] Telephone (research): [phone]  6:34 PM 12/09/2024

## 2024-12-09 NOTE — Plan of Care (Signed)
  Problem: Education: Goal: Knowledge of General Education information will improve Description: Including pain rating scale, medication(s)/side effects and non-pharmacologic comfort measures Outcome: Progressing   Problem: Health Behavior/Discharge Planning: Goal: Ability to manage health-related needs will improve Outcome: Progressing   Problem: Clinical Measurements: Goal: Ability to maintain clinical measurements within normal limits will improve Outcome: Progressing Goal: Will remain free from infection Outcome: Progressing Goal: Diagnostic test results will improve Outcome: Progressing Goal: Respiratory complications will improve Outcome: Progressing Goal: Cardiovascular complication will be avoided Outcome: Progressing   Problem: Nutrition: Goal: Adequate nutrition will be maintained Outcome: Progressing   Problem: Elimination: Goal: Will not experience complications related to bowel motility Outcome: Progressing Goal: Will not experience complications related to urinary retention Outcome: Progressing

## 2024-12-09 NOTE — Progress Notes (Signed)
 [facility] Progress Note Patient: Brandon [name] FMW:[id number] DOB: 1941-10-11  DOA: 12/06/2024 DOS: the patient was seen and examined on 12/09/2024  Brief Summary: Patient with PMH of COPD, HTN, HLD, AAA, depression, obesity presents to the hospital with complaints of progressively worsening cough and shortness of breath as well as confusion.  Significant events: 1/7 - 1/14 admitted for acute hypoxic respiratory failure secondary to COPD exacerbation.  Was able to be weaned down to room air at the time of the discharge. 1/22 presented due to worsening shortness of breath especially when lying down.  Admitted to the hospitalist service.  PCCM was consulted.  Lactic acid elevated. 1/23 echocardiogram EF 55 to 60%, no WMA, no valvular abnormality, RV normal. 1/24 echo bubble study negative for any shunt. 1/25 switch to oral antibiotics for  Consults: PCCM   Assessment and plan: Acute hypoxic respiratory failure. Secondary to COPD exacerbation as well as acute on chronic diastolic CHF. Suspected pneumonia Patient was able to be weaned down to room air during his last admission when he was presented to the hospital with COPD exacerbation. Currently requiring heated high flow for oxygenation. Upon exam bilateral expiratory wheezing which improves with mucus clearance. Add flutter valve. Continue with current IV antibiotics of ceftriaxone  and azithromycin . Procalcitonin is negative. COVID-negative.  Flu negative.  RSV negative.  During his recent admission.  RVP negative during this admission. Continue with IV diuresis. Blood cultures so far negative.  MRSA PCR negative. Continue with steroids and nebulizer therapy. Echocardiogram shows preserved EF, no wall motion abnormality.  No valvular abnormality. Echo bubble study negative  Oropharyngeal candidiasis. Initiating Diflucan   Most likely due to his recent steroid.  Most likely will worsen with ongoing use of antibiotics and  steroids.  History of SDH as well as ICH. No focal deficit. Intermittent confusion noted by family in the setting of hypoxia.  HTN. On amlodipine . Given significant lower extremity edema would recommend to discontinue this medication upon discharge.  History of CAD. PAD AAA with repair. Currently no chest pain. Echocardiogram no wall motion abnormality. EKG nonischemic. Continue aspirin   Leukocytosis. This is mostly steroid-induced. For now we will monitor.  Lactic acidosis. Likely due to increased work of breathing. Resolved. LFT normal.  Hard of hearing. Noted.  Obesity Class 2 Body mass index is 36.77 kg/m.  Placing the pt at higher risk of poor outcomes.   Code Status: Full Code      DVT Prophylaxis: heparin  injection 5,000 Units Start: 12/06/24 1400 Place TED hose Start: 12/06/24 0813 SCDs Start: 12/06/24 0646   Data review I have Reviewed nursing notes, Vitals, and Lab results. Since last encounter, pertinent lab results CBC and BMP   . I have ordered test including CBC and BMP  .   Physical exam. Vitals:   12/09/24 0342 12/09/24 0500 12/09/24 0848 12/09/24 1200  BP: 116/65     Pulse: 68     Resp:      Temp: 97.8 F (36.6 C)  98.1 F (36.7 C) 98.1 F (36.7 C)  TempSrc: Oral  Oral Oral  SpO2: 90%     Weight:  114 kg    Height:        General: in Mild distress, No Rash, thrush resolved. Cardiovascular: S1 and S2 Present, No Murmur Respiratory: Good respiratory effort, Bilateral Air entry present.  Bilateral basal crackles, No wheezes Abdomen: Bowel Sound present, No tenderness Extremities: Improving edema Neuro: Alert and oriented x3, no new focal deficit  Subjective: Continues to  have some cough.  Breathing improving per patient.  Swelling in the leg unchanged.  Family Communication: No one at bedside.  Called spouse on the phone.  Unable to leave a voicemail.  Disposition Plan: Status is: Inpatient Remains inpatient appropriate because:  Monitor for improvement in respiratory status.   Planned Discharge Destination: TBD Diet: Diet Order             Diet Heart Fluid consistency: Thin  Diet effective now                   MEDICATIONS: Scheduled Meds:  aspirin   81 mg Oral Daily   azithromycin   500 mg Oral Daily   benzonatate   100 mg Oral TID   budesonide  (PULMICORT ) nebulizer solution  0.25 mg Nebulization BID   cefUROXime   500 mg Oral BID WC   citalopram   20 mg Oral Daily   dextromethorphan   30 mg Oral BID   fluconazole   200 mg Oral Daily   guaiFENesin   600 mg Oral BID   heparin   5,000 Units Subcutaneous Q8H   ipratropium  0.5 mg Nebulization Q6H   lactobacillus  1 g Oral TID WC   levalbuterol   1.25 mg Nebulization Q6H   methylPREDNISolone  (SOLU-MEDROL ) injection  40 mg Intravenous Q12H   pantoprazole   40 mg Oral Daily   senna-docusate  2 tablet Oral BID   sodium chloride  flush  3 mL Intravenous Q12H   sodium chloride  HYPERTONIC  4 mL Nebulization Daily   Continuous Infusions: PRN Meds:.acetaminophen  **OR** acetaminophen , albuterol , bisacodyl , ondansetron  **OR** ondansetron  (ZOFRAN ) IV, oxyCODONE , traZODone   Author: Yetta Blanch, MD  [facility] 12/09/2024  7:03 PM Between 7PM-7AM, please contact night-coverage, check [email or web address] for on call.

## 2024-12-10 DIAGNOSIS — Z87891 Personal history of nicotine dependence: Secondary | ICD-10-CM

## 2024-12-10 DIAGNOSIS — J189 Pneumonia, unspecified organism: Secondary | ICD-10-CM | POA: Diagnosis not present

## 2024-12-10 DIAGNOSIS — J441 Chronic obstructive pulmonary disease with (acute) exacerbation: Secondary | ICD-10-CM | POA: Diagnosis not present

## 2024-12-10 DIAGNOSIS — J9601 Acute respiratory failure with hypoxia: Secondary | ICD-10-CM | POA: Diagnosis not present

## 2024-12-10 LAB — COMPREHENSIVE METABOLIC PANEL WITH GFR
ALT: 82 U/L — ABNORMAL HIGH (ref 0–44)
AST: 22 U/L (ref 15–41)
Albumin: 3 g/dL — ABNORMAL LOW (ref 3.5–5.0)
Alkaline Phosphatase: 72 U/L (ref 38–126)
Anion gap: 9 (ref 5–15)
BUN: 30 mg/dL — ABNORMAL HIGH (ref 8–23)
CO2: 28 mmol/L (ref 22–32)
Calcium: 8.4 mg/dL — ABNORMAL LOW (ref 8.9–10.3)
Chloride: 98 mmol/L (ref 98–111)
Creatinine, Ser: 1.07 mg/dL (ref 0.61–1.24)
GFR, Estimated: >60 mL/min
Glucose, Bld: 159 mg/dL — ABNORMAL HIGH (ref 70–99)
Potassium: 4.6 mmol/L (ref 3.5–5.1)
Sodium: 134 mmol/L — ABNORMAL LOW (ref 135–145)
Total Bilirubin: 0.5 mg/dL (ref 0.0–1.2)
Total Protein: 5.9 g/dL — ABNORMAL LOW (ref 6.5–8.1)

## 2024-12-10 LAB — CBC
HCT: 39.4 % (ref 39.0–52.0)
Hemoglobin: 13.6 g/dL (ref 13.0–17.0)
MCH: 33 pg (ref 26.0–34.0)
MCHC: 34.5 g/dL (ref 30.0–36.0)
MCV: 95.6 fL (ref 80.0–100.0)
Platelets: 257 10*3/uL (ref 150–400)
RBC: 4.12 MIL/uL — ABNORMAL LOW (ref 4.22–5.81)
RDW: 12.7 % (ref 11.5–15.5)
WBC: 19 10*3/uL — ABNORMAL HIGH (ref 4.0–10.5)
nRBC: 0 % (ref 0.0–0.2)

## 2024-12-10 LAB — GLUCOSE, CAPILLARY: Glucose-Capillary: 146 mg/dL — ABNORMAL HIGH (ref 70–99)

## 2024-12-10 LAB — MAGNESIUM: Magnesium: 2.5 mg/dL — ABNORMAL HIGH (ref 1.7–2.4)

## 2024-12-10 MED ORDER — RISAQUAD PO CAPS
1.0000 | ORAL_CAPSULE | Freq: Three times a day (TID) | ORAL | Status: AC
  Administered 2024-12-10 – 2024-12-21 (×34): 1 via ORAL
  Filled 2024-12-10 (×34): qty 1

## 2024-12-10 MED ORDER — SORBITOL 70 % SOLN
30.0000 mL | Freq: Once | Status: AC
  Administered 2024-12-10: 30 mL via ORAL
  Filled 2024-12-10: qty 30

## 2024-12-10 MED ORDER — IPRATROPIUM-ALBUTEROL 0.5-2.5 (3) MG/3ML IN SOLN
3.0000 mL | Freq: Four times a day (QID) | RESPIRATORY_TRACT | Status: DC
  Administered 2024-12-10 (×2): 3 mL via RESPIRATORY_TRACT
  Filled 2024-12-10: qty 3

## 2024-12-10 NOTE — Progress Notes (Signed)
 [facility] Progress Note Patient: Brandon [name] FMW:[id number] DOB: [birth year]  DOA: 12/06/2024 DOS: the patient was seen and examined on 12/10/2024  Brief Summary: Patient with PMH of COPD, HTN, HLD, AAA, depression, obesity presents to the hospital with complaints of progressively worsening cough and shortness of breath as well as confusion.  Significant events: 1/7 - 1/14 admitted for acute hypoxic respiratory failure secondary to COPD exacerbation.  Was able to be weaned down to room air at the time of the discharge. 1/22 presented due to worsening shortness of breath especially when lying down.  Admitted to the hospitalist service.  PCCM was consulted.  Lactic acid elevated. 1/23 echocardiogram EF 55 to 60%, no WMA, no valvular abnormality, RV normal. 1/24 echo bubble study negative for any shunt. 1/25 switch to oral antibiotics  Consults: PCCM   Assessment and plan: Acute hypoxic respiratory failure. Secondary to COPD exacerbation as well as acute on chronic diastolic CHF. Community-acquired pneumonia. Patient was able to be weaned down to room air during his last admission when he was presented to the hospital with COPD exacerbation. Currently requiring heated high flow for oxygenation. Continue flutter valve.  Treated with hypertonic saline nebs. Initially was on IV ceftriaxone  and azithromycin .  Now on oral cefadroxil and azithromycin . Blood cultures negative.  MRSA PCR negative. Procalcitonin is negative. Respiratory pathogen panel negative. Currently on IV Solu-Medrol .   Continue nebulizer therapy.  Acute on chronic HFpEF. Echocardiogram shows preserved EF, no wall motion abnormality.  No valvular abnormality. Echo bubble study negative for any shunt. Treated with IV diuresis. Currently on hold due to worsening renal function.  Oropharyngeal candidiasis. On Diflucan . Most likely due to his recent steroid. Most likely will worsen with ongoing use of antibiotics  and steroids.  History of SDH as well as ICH. No focal deficit. Intermittent confusion noted by family in the setting of hypoxia.  HTN. On amlodipine . Given significant lower extremity edema would recommend to discontinue this medication upon discharge.  History of CAD. PAD AAA with repair. Currently no chest pain. Echocardiogram no wall motion abnormality. EKG nonischemic. Continue aspirin   Leukocytosis. This is mostly steroid-induced. For now we will monitor.  Lactic acidosis. Likely due to increased work of breathing.  Resolved.  LFT elevation. AST ALT ratio 1 is to 2.  AST normalized.  Hard of hearing. Noted.  Obesity Class 2 Body mass index is 36.77 kg/m.  Placing the pt at higher risk of poor outcomes.   Code Status: Full Code    DVT Prophylaxis: heparin  injection 5,000 Units Start: 12/06/24 1400 Place TED hose Start: 12/06/24 0813 SCDs Start: 12/06/24 0646  Data review I have Reviewed nursing notes, Vitals, and Lab results. Since last encounter, pertinent lab results CBC and BMP   . I have ordered test including CBC and BMP  . I have discussed pt's care plan and test results with pulmonary  .  Physical exam. Vitals:   12/10/24 0628 12/10/24 0717 12/10/24 1116 12/10/24 1158  BP: (!) 107/55 112/70 112/74 112/74  Pulse: 61 68  74  Resp:  (!) 21  (!) 22  Temp: 98 F (36.7 C) 98.1 F (36.7 C) 97.9 F (36.6 C)   TempSrc: Oral Oral Oral   SpO2:  (!) 87%  93%  Weight:      Height:        General: in Mild distress, No Rash Cardiovascular: S1 and S2 Present, No Murmur Respiratory: Good respiratory effort, Bilateral Air entry present.  Base of crackles, No  wheezes Abdomen: Bowel Sound present, No tenderness Extremities: Trace edema Neuro: Alert and oriented x3, no new focal deficit   Subjective: Denies any complaint.  Feeling better.  No nausea or vomiting.  Family Communication: Discussed with sister on the phone.  Disposition Plan: Status is:  Inpatient Remains inpatient appropriate because: Need for improvement in oxygenation   Planned Discharge Destination: TBD Diet: Diet Order             Diet Heart Fluid consistency: Thin  Diet effective now                   MEDICATIONS: Scheduled Meds:  acidophilus  1 capsule Oral TID with meals   aspirin   81 mg Oral Daily   benzonatate   100 mg Oral TID   budesonide  (PULMICORT ) nebulizer solution  0.25 mg Nebulization BID   cefUROXime   500 mg Oral BID WC   citalopram   20 mg Oral Daily   dextromethorphan   30 mg Oral BID   fluconazole   200 mg Oral Daily   guaiFENesin   600 mg Oral BID   heparin   5,000 Units Subcutaneous Q8H   ipratropium-albuterol   3 mL Nebulization Q6H   methylPREDNISolone  (SOLU-MEDROL ) injection  40 mg Intravenous Q12H   pantoprazole   40 mg Oral Daily   senna-docusate  2 tablet Oral BID   sodium chloride  flush  3 mL Intravenous Q12H   sodium chloride  HYPERTONIC  4 mL Nebulization Daily   sorbitol   30 mL Oral Once   Continuous Infusions: PRN Meds:.acetaminophen  **OR** acetaminophen , albuterol , bisacodyl , ondansetron  **OR** ondansetron  (ZOFRAN ) IV, oxyCODONE , traZODone   Author: Yetta Blanch, MD  [facility] 12/10/2024  5:44 PM Between 7PM-7AM, please contact night-coverage, check [email or web address] for on call.

## 2024-12-10 NOTE — Consult Note (Signed)
 "  NAME:  Brandon [name], MRN:  [id number], DOB:  Aug 30, 1941, LOS: 4 ADMISSION DATE:  12/06/2024, CONSULTATION DATE:  12/10/24 REFERRING MD:  Dr. Marcene, CHIEF COMPLAINT:  COPD exacerbation   History of Present Illness:   Brandon [name] is an 84 y/o M with a PMH significant for HTN, HLD, AAA w/ repair, Grade II COPD (FEV1 1.59 L, 55% predicted) who presents with shortness of breath, cough and acute hypoxic respiratory failure. Pulmonology consulted for evaluation and management of the latter.  He has a history of COPD, grade two, with multiple admissions for exacerbations. Shortness of breath started suddenly this morning around 1:30 AM. A similar episode occurred two weeks ago, but he thought he was improving until the current episode.  He denies using home oxygen and was not discharged with oxygen after previous hospitalizations. He experiences shortness of breath sometimes when lying flat and has noticed swelling in his feet and legs, which is worse than usual. No fever, chills, night sweats, or coughing up sputum or blood.  He uses an inhaler, Breztri , two puffs in the morning and two puffs in the evening, and rinses his mouth after use. He also has a nebulizer at home.  He quit smoking cigarettes 13 years ago. Pertinent  Medical History   Past Medical History:  Diagnosis Date   AAA (abdominal aortic aneurysm)    COPD (chronic obstructive pulmonary disease) (HCC)    DIC (disseminated intravascular coagulation) 09/09/2012   Hyperlipidemia    Hypertension    Leg wound, left 12/12/2013   Peripheral vascular disease    Respiratory failure, post-operative 09/09/2012   Shortness of breath    Significant Hospital Events: Including procedures, antibiotic start and stop dates in addition to other pertinent events   /7 - 1/14 admitted for acute hypoxic respiratory failure secondary to COPD exacerbation.  Was able to be weaned down to room air at the time of the discharge. 1/22 presented due  to worsening shortness of breath especially when lying down.  Admitted to the hospitalist service.  PCCM was consulted.  Lactic acid elevated. 1/23 echocardiogram EF 55 to 60%, no WMA, no valvular abnormality, RV normal. Interim History / Subjective:   Presented with signs and symptoms of COPD exacerbation with cough, phlegm shortness of breath and wheezing.  Feels much better than before.  However still needing high oxygen with high flow nasal cannula FiO2 75%.  Objective    Blood pressure 112/74, pulse 74, temperature 97.9 F (36.6 C), temperature source Oral, resp. rate (!) 22, height 5' 11 (1.803 m), weight 113.8 kg, SpO2 93%.    FiO2 (%):  [70 %-75 %] 75 %   Intake/Output Summary (Last 24 hours) at 12/10/2024 1612 Last data filed at 12/10/2024 0300 Gross per 24 hour  Intake 500 ml  Output 1200 ml  Net -700 ml   Filed Weights   12/08/24 0500 12/09/24 0500 12/10/24 0500  Weight: 119.6 kg 114 kg 113.8 kg    Examination: General: Elderly male not appearing to be in respiratory distress. Lungs: clear to auscultation bilaterally.  Heart: regular rate rhythm, no murmur appreciated.  Abdomen: non tender, non distended. Normal BS.  Neuro: axox 3.  Hard of hearing..  Moving all extremities Left greater than right leg pitting edema.      Latest Ref Rng & Units 10/16/2024    3:27 PM  PFT Results  FVC-Pre L 2.85   FVC-Predicted Pre % 69   FVC-Post L 2.69   FVC-Predicted Post %  66   Pre FEV1/FVC % % 56   Post FEV1/FCV % % 58   FEV1-Pre L 1.59   FEV1-Predicted Pre % 55   FEV1-Post L 1.57   DLCO uncorrected ml/min/mmHg 14.09   DLCO UNC% % 57   DLCO corrected ml/min/mmHg 14.09   DLCO COR %Predicted % 57   DLVA Predicted % 82     LABS    PULMONARY No results for input(s): PHART, PCO2ART, PO2ART, HCO3, TCO2, O2SAT in the last 168 hours.  Invalid input(s): PCO2, PO2  CBC Recent Labs  Lab 12/08/24 0533 12/09/24 0550 12/10/24 0204  HGB 14.0 13.6 13.6   HCT 40.9 38.5* 39.4  WBC 18.9* 18.6* 19.0*  PLT 233 258 257    COAGULATION No results for input(s): INR in the last 168 hours.  CARDIAC  No results for input(s): TROPONINI in the last 168 hours. Recent Labs  Lab 12/06/24 0514 12/06/24 0647  PROBNP 136.0 150.0     CHEMISTRY Recent Labs  Lab 12/06/24 0514 12/06/24 0647 12/07/24 0532 12/08/24 0533 12/09/24 0550 12/10/24 0204  NA 134*  --  134* 134* 134* 134*  K 4.0  --  4.7 4.7 4.2 4.6  CL 100  --  99 100 98 98  CO2 25  --  25 25 26 28   GLUCOSE 109*  --  116* 127* 160* 159*  BUN 14  --  19 23 24* 30*  CREATININE 0.73  --  0.80 0.77 0.75 1.07  CALCIUM  8.2*  --  8.5* 8.5* 8.5* 8.4*  MG  --  2.1  --   --  2.3 2.5*  PHOS  --  2.8  --   --   --   --    Estimated Creatinine Clearance: 69.5 mL/min (by C-G formula based on SCr of 1.07 mg/dL).   LIVER Recent Labs  Lab 12/08/24 0533 12/09/24 0550 12/10/24 0204  AST 48* 22 22  ALT 102* 84* 82*  ALKPHOS 85 83 72  BILITOT 0.5 0.4 0.5  PROT 5.9* 5.8* 5.9*  ALBUMIN  2.9* 2.9* 3.0*     INFECTIOUS Recent Labs  Lab 12/06/24 0647 12/06/24 0651 12/06/24 1959 12/08/24 0533  LATICACIDVEN  --  3.1* 4.0* 1.6  PROCALCITON <0.10  --   --   --      ENDOCRINE CBG (last 3)  Recent Labs    12/08/24 0759 12/09/24 0850 12/10/24 0720  GLUCAP 124* 192* 146*   Imaging: - Chest x-ray with bibasilar infiltrates. CT chest June 2026 done during previous admission: Showed emphysematous changes without any PE. PFT 2025 moderate obstruction with moderate reduction in DLCO, no BD response Echo moderate LVH, EF normal, bubble study negative.  Significant labs during this hospitalization: - Procalcitonin negative, proBNP normal,    Resolved problem list   Assessment and Plan  BAseline COPD Gold stge 2: No home oxygen use or recent smoking Acute exacerbation of COPD  Hypoxemic respiratory failure: ?  Bibasilar pneumonia:  Unclear cause for such severe  hypoxemia. Based on previous experience on the patient the patient did have resolution of hypoxemia after treatment for COPD.  - Continue current treatment with DuoNebs scheduled and as needed, budesonide  nebs scheduled. -Continue hypertonic nebs. -Continue antibiotics to cover for pneumonia -Continue Solu-Medrol  twice daily. -If patient continues to have persistent hypoxemia and needing high flow nasal cannula will have to consider CT chest with PE protocol in 1 to 2 days. -Goal SpO2 88-92%. - Continue to monitor bicarb and creatinine.  BUN is  trending up.  May need to hold Lasix  in future.  Pulmonary will continue to follow.   Total care time: 50 minutes   Care time was exclusive of separately billable procedures and treating other patients.  Care was necessary to treat or prevent imminent or life-threatening deterioration.   Care was time spent personally by me on the following activities: development of treatment plan with patient and/or surrogate as well as nursing, discussions with consultants, evaluation of patient's response to treatment, examination of patient, obtaining history from patient or surrogate, ordering and performing treatments and interventions, ordering and review of laboratory studies, ordering and review of radiographic studies and pulse oximetry.   Sammi JONETTA Fredericks, MD Pulmonary, Critical Care and Sleep Attending.   12/10/2024, 4:31 PM  "

## 2024-12-10 NOTE — Plan of Care (Signed)

## 2024-12-11 DIAGNOSIS — J189 Pneumonia, unspecified organism: Secondary | ICD-10-CM | POA: Diagnosis not present

## 2024-12-11 DIAGNOSIS — Z87891 Personal history of nicotine dependence: Secondary | ICD-10-CM | POA: Diagnosis not present

## 2024-12-11 DIAGNOSIS — J441 Chronic obstructive pulmonary disease with (acute) exacerbation: Secondary | ICD-10-CM | POA: Diagnosis not present

## 2024-12-11 DIAGNOSIS — J9601 Acute respiratory failure with hypoxia: Secondary | ICD-10-CM | POA: Diagnosis not present

## 2024-12-11 LAB — COMPREHENSIVE METABOLIC PANEL WITH GFR
ALT: 68 U/L — ABNORMAL HIGH (ref 0–44)
AST: 15 U/L (ref 15–41)
Albumin: 3 g/dL — ABNORMAL LOW (ref 3.5–5.0)
Alkaline Phosphatase: 73 U/L (ref 38–126)
Anion gap: 8 (ref 5–15)
BUN: 31 mg/dL — ABNORMAL HIGH (ref 8–23)
CO2: 29 mmol/L (ref 22–32)
Calcium: 8.5 mg/dL — ABNORMAL LOW (ref 8.9–10.3)
Chloride: 98 mmol/L (ref 98–111)
Creatinine, Ser: 0.93 mg/dL (ref 0.61–1.24)
GFR, Estimated: >60 mL/min
Glucose, Bld: 179 mg/dL — ABNORMAL HIGH (ref 70–99)
Potassium: 4.9 mmol/L (ref 3.5–5.1)
Sodium: 134 mmol/L — ABNORMAL LOW (ref 135–145)
Total Bilirubin: 0.4 mg/dL (ref 0.0–1.2)
Total Protein: 5.6 g/dL — ABNORMAL LOW (ref 6.5–8.1)

## 2024-12-11 LAB — CULTURE, BLOOD (ROUTINE X 2)
Culture: NO GROWTH
Culture: NO GROWTH
Special Requests: ADEQUATE

## 2024-12-11 LAB — GLUCOSE, CAPILLARY: Glucose-Capillary: 167 mg/dL — ABNORMAL HIGH (ref 70–99)

## 2024-12-11 LAB — CULTURE, RESPIRATORY W GRAM STAIN: Culture: NORMAL

## 2024-12-11 LAB — CBC
HCT: 40.1 % (ref 39.0–52.0)
Hemoglobin: 13.6 g/dL (ref 13.0–17.0)
MCH: 32.9 pg (ref 26.0–34.0)
MCHC: 33.9 g/dL (ref 30.0–36.0)
MCV: 97.1 fL (ref 80.0–100.0)
Platelets: 258 10*3/uL (ref 150–400)
RBC: 4.13 MIL/uL — ABNORMAL LOW (ref 4.22–5.81)
RDW: 12.6 % (ref 11.5–15.5)
WBC: 18.7 10*3/uL — ABNORMAL HIGH (ref 4.0–10.5)
nRBC: 0 % (ref 0.0–0.2)

## 2024-12-11 LAB — MAGNESIUM: Magnesium: 2.8 mg/dL — ABNORMAL HIGH (ref 1.7–2.4)

## 2024-12-11 MED ORDER — IPRATROPIUM-ALBUTEROL 0.5-2.5 (3) MG/3ML IN SOLN
3.0000 mL | Freq: Four times a day (QID) | RESPIRATORY_TRACT | Status: DC
  Administered 2024-12-11 – 2024-12-15 (×17): 3 mL via RESPIRATORY_TRACT
  Filled 2024-12-11 (×17): qty 3

## 2024-12-11 MED ORDER — SODIUM CHLORIDE 3 % IN NEBU
4.0000 mL | INHALATION_SOLUTION | Freq: Every day | RESPIRATORY_TRACT | Status: AC
  Administered 2024-12-12 – 2024-12-14 (×3): 4 mL via RESPIRATORY_TRACT
  Filled 2024-12-11 (×3): qty 4

## 2024-12-11 NOTE — TOC Progression Note (Signed)
 Transition of Care [facility]) - Progression Note    Patient Details  Name: Brandon [name] MRN: [id number] Date of Birth: [birth year]  [facility] CM/SW Contact  [name], RN Phone Number: 12/11/2024, 12:29 PM  Clinical Narrative:    Patient continues on HHFO2, no plan for d/c at this time.   Patient does report wanting to go home rather than SNF at d/c.  Continued medical workup, CM will continue to follow.   Expected Discharge Plan:  (TBD) Barriers to Discharge: Continued Medical Work up               Expected Discharge Plan and Services       Living arrangements for the past 2 months: Single Family Home                                       Social Drivers of Health (SDOH) Interventions SDOH Screenings   Food Insecurity: No Food Insecurity (12/06/2024)  Housing: Low Risk (12/06/2024)  Transportation Needs: No Transportation Needs (12/06/2024)  Utilities: Not At Risk (12/06/2024)  Depression (PHQ2-9): Low Risk (09/22/2022)  Social Connections: Moderately Isolated (12/06/2024)  Tobacco Use: Medium Risk (12/06/2024)    Readmission Risk Interventions    11/22/2024    2:11 PM  Readmission Risk Prevention Plan  Post Dischage Appt Complete  Medication Screening Complete  Transportation Screening Complete

## 2024-12-11 NOTE — Progress Notes (Signed)
 [facility] Progress Note Patient: Brandon [name] FMW:[id number] DOB: 1941/01/13  DOA: 12/06/2024 DOS: the patient was seen and examined on 12/11/2024  Brief Summary: Patient with PMH of COPD, HTN, HLD, AAA, depression, obesity presents to the hospital with complaints of progressively worsening cough and shortness of breath as well as confusion.  Significant events: 1/7 - 1/14 admitted for acute hypoxic respiratory failure secondary to COPD exacerbation.  Was able to be weaned down to room air at the time of the discharge. 1/22 presented due to worsening shortness of breath especially when lying down.  Admitted to the hospitalist service.  PCCM was consulted.  Lactic acid elevated.  Being treated for acute hypoxic respiratory failure secondary to COPD and pneumonia. 1/23 echocardiogram EF 55 to 60%, no WMA, no valvular abnormality, RV normal. 1/24 echo bubble study negative for any shunt. 1/25 switch to oral antibiotics  Consults: PCCM   Assessment and plan: Acute hypoxic respiratory failure. Secondary to COPD exacerbation as well as acute on chronic diastolic CHF. Community-acquired pneumonia. Patient was able to be weaned down to room air during his last admission when he was presented to the hospital with COPD exacerbation. Currently requiring heated high flow for oxygenation. Continue flutter valve.  Treated with hypertonic saline nebs. Initially was on IV ceftriaxone  and azithromycin .  Now on oral cefadroxil and completed 5 days of azithromycin . Blood cultures negative.  MRSA PCR negative. Procalcitonin is negative. Respiratory pathogen panel negative. Currently on IV Solu-Medrol .   Continue nebulizer therapy.  Acute on chronic HFpEF. Echocardiogram shows preserved EF, no wall motion abnormality.  No valvular abnormality. Echo bubble study negative for any shunt. Treated with IV diuresis. Currently on hold due to worsening renal function.  Oropharyngeal candidiasis. On  Diflucan . Most likely due to his recent use of steroid. Most likely will worsen with ongoing use of antibiotics and steroids.  History of SDH as well as ICH. No focal deficit. Intermittent confusion noted by family in the setting of hypoxia.  HTN. On amlodipine . Given significant lower extremity edema would recommend to discontinue this medication upon discharge.  History of CAD. PAD AAA with repair. Currently no chest pain. Echocardiogram no wall motion abnormality. EKG nonischemic. Continue aspirin   Leukocytosis. This is mostly steroid-induced. For now we will monitor.  Lactic acidosis. Likely due to increased work of breathing.  Resolved.  LFT elevation. AST ALT ratio 1 is to 2.  AST normalized.  Hard of hearing. Noted.  Obesity Class 2 Body mass index is 36.77 kg/m.  Placing the pt at higher risk of poor outcomes.   Code Status: Full Code    DVT Prophylaxis: heparin  injection 5,000 Units Start: 12/06/24 1400 Place TED hose Start: 12/06/24 0813 SCDs Start: 12/06/24 0646   Data review I have Reviewed nursing notes, Vitals, and Lab results. Since last encounter, pertinent lab results CBC and BMP   . I have ordered test including CBC and BMP  .     Latest Ref Rng & Units 12/11/2024    4:47 AM 12/10/2024    2:04 AM 12/09/2024    5:50 AM  CBC  WBC 4.0 - 10.5 K/uL 18.7  19.0  18.6   Hemoglobin 13.0 - 17.0 g/dL 86.3  86.3  86.3   Hematocrit 39.0 - 52.0 % 40.1  39.4  38.5   Platelets 150 - 400 K/uL 258  257  258     Lab Results  Component Value Date   NA 134 (L) 12/11/2024   K 4.9 12/11/2024  CL 98 12/11/2024   CO2 29 12/11/2024   Physical exam. Vitals:   12/11/24 0746 12/11/24 1145 12/11/24 1146 12/11/24 1314  BP:  97/84    Pulse:  64  66  Resp: 18 20  19   Temp: 97.8 F (36.6 C)  98.2 F (36.8 C)   TempSrc: Oral  Oral   SpO2:  96%  95%  Weight:      Height:       General: in Mild distress, No Rash Cardiovascular: S1 and S2 Present, No  Murmur Respiratory: Good respiratory effort, Bilateral Air entry present.  Bilateral basal crackles, bilateral expiratory wheezes Abdomen: Bowel Sound present, No tenderness Extremities: No edema Neuro: Alert and oriented x3, no new focal deficit  Subjective: Feeling better.  Still has significant cough.  No nausea no vomiting.  No chest pain.  Family Communication: Discussed with family on phone on 1/26.  Disposition Plan: Status is: Inpatient Remains inpatient appropriate because: Monitor for improvement in respiratory status.  Still on heated high flow.   Planned Discharge Destination: TBD.  SNF recommended by PT. Diet: Diet Order             Diet Heart Fluid consistency: Thin  Diet effective now                   MEDICATIONS: Scheduled Meds:  acidophilus  1 capsule Oral TID with meals   aspirin   81 mg Oral Daily   benzonatate   100 mg Oral TID   budesonide  (PULMICORT ) nebulizer solution  0.25 mg Nebulization BID   cefUROXime   500 mg Oral BID WC   citalopram   20 mg Oral Daily   dextromethorphan   30 mg Oral BID   fluconazole   200 mg Oral Daily   guaiFENesin   600 mg Oral BID   heparin   5,000 Units Subcutaneous Q8H   ipratropium-albuterol   3 mL Nebulization Q6H   methylPREDNISolone  (SOLU-MEDROL ) injection  40 mg Intravenous Q12H   pantoprazole   40 mg Oral Daily   senna-docusate  2 tablet Oral BID   sodium chloride  flush  3 mL Intravenous Q12H   sodium chloride  HYPERTONIC  4 mL Nebulization Daily   Continuous Infusions: PRN Meds:.acetaminophen  **OR** acetaminophen , albuterol , bisacodyl , ondansetron  **OR** ondansetron  (ZOFRAN ) IV, oxyCODONE , traZODone   Author: Yetta Blanch, MD  [facility] 12/11/2024  6:15 PM Between 7PM-7AM, please contact night-coverage, check [email or web address] for on call.

## 2024-12-11 NOTE — Progress Notes (Signed)
 Physical Therapy Treatment Patient Details Name: Brandon [name] MRN: [id number] DOB: May 12, 1941 Today's Date: 12/11/2024   History of Present Illness 84 y.o male presents [facility] on 1/22 with sudden onset SOB. Currently on 45L HHFNC. PMH includes: HTN, HLD, AAA s/p repair 2013, COPD, and PVD.    PT Comments  Pt demonstrates gradual progress in activity tolerance despite SOB and continuing to require high levels of O2 through HHFNC. Pt demonstrates improved use of bed rails during bed mobility and completes transfer CGA. MinA required for STS but improves to CGA with review of hand placement to assist with STS. STSx5 performed at end of session to promote strengthening and balance upon standing.  Pt tolerates multiple bouts of short distance ambulation with one seated rest break due to SOB and fatigue. 2-3 seated rest breaks taken total during session 2-3 minutes each in duration. Pursed lip breathing reviewed to manage SOB with activity. Pt would benefit from continued PT services focused on bed mobility, balance, transfers, and progressing gait. <3hrs rehab recommended at this time, however, family prefers [facility] PT and pt likely to progress with improved respiratory status.   If plan is discharge home, recommend the following: A little help with walking and/or transfers;A little help with bathing/dressing/bathroom;Assistance with cooking/housework;Assist for transportation;Help with stairs or ramp for entrance;Direct supervision/assist for medications management   Can travel by private vehicle     No (Oxygen requirements)  Equipment Recommendations  Rolling walker (2 wheels);BSC/3in1    Recommendations for Other Services       Precautions / Restrictions Precautions Precautions: Fall Recall of Precautions/Restrictions: Intact Precaution/Restrictions Comments: Monitor O2, on 45L HHFNC Restrictions Weight Bearing Restrictions Per Provider Order: No     Mobility  Bed Mobility Overal bed  mobility: Needs Assistance Bed Mobility: Supine to Sit, Sit to Supine     Supine to sit: Contact guard Sit to supine: Contact guard assist   General bed mobility comments: Uses bed rails to pull trunk to upright position with increased time. No difficulty mobilizing B LE over EOB. Steady EOB, no dizziness.    Transfers Overall transfer level: Needs assistance Equipment used: Rolling walker (2 wheels) Transfers: Sit to/from Stand Sit to Stand: Contact guard assist, Min assist           General transfer comment: MinA required for initial STS, pt requesting HHA to pull to standing prior to using walker, improved to CGA with increased reps and cues for hand placement. STS x7 completed throughout session.    Ambulation/Gait Ambulation/Gait assistance: Contact guard assist Gait Distance (Feet): 5 Feet (forward and backward x6) Assistive device: Rolling walker (2 wheels) Gait Pattern/deviations: Step-through pattern, Decreased stride length, Trunk flexed, Narrow base of support   Gait velocity interpretation: <1.31 ft/sec, indicative of household ambulator   General Gait Details: Pt ambulates forward and backward with walker, tolerating 3 laps prior to seated rest break and completes 6 laps total. Incrased fatigue and SOB with short distance ambulation. Seated rest break ~2-3 minutes in duration.   Stairs             Wheelchair Mobility     Tilt Bed    Modified Rankin (Stroke Patients Only)       Balance Overall balance assessment: Needs assistance Sitting-balance support: No upper extremity supported, Feet supported Sitting balance-Leahy Scale: Good Sitting balance - Comments: No sitting balance concerns.   Standing balance support: Bilateral upper extremity supported, During functional activity, Reliant on assistive device for balance Standing balance-Leahy Scale:  Poor Standing balance comment: Pt reliant on walker andCGA to maintain balance in standing. No  overt LOB occurs during session. Does better maintaining balance while stepping backwards.                            Communication Communication Communication: Impaired Factors Affecting Communication: Hearing impaired  Cognition Arousal: Alert Behavior During Therapy: WFL for tasks assessed/performed   PT - Cognitive impairments: No apparent impairments                         Following commands: Intact      Cueing Cueing Techniques: Verbal cues, Tactile cues, Visual cues  Exercises General Exercises - Lower Extremity Ankle Circles/Pumps: AROM, 10 reps, Both, Seated Long Arc Quad: AROM, Both, 10 reps, Seated Hip ABduction/ADduction: AROM, Both, 10 reps, Seated Hip Flexion/Marching: AROM, Both, 10 reps, Seated (with exernal rotation) Other Exercises Other Exercises: STS x5    General Comments General comments (skin integrity, edema, etc.): VSS throughout with pursed lip breathing between activities. No new skin abnormalities noted.      Pertinent Vitals/Pain Pain Assessment Pain Assessment: No/denies pain    Home Living                          Prior Function            PT Goals (current goals can now be found in the care plan section) Acute Rehab PT Goals Patient Stated Goal: Get well, improve strength and endurance again PT Goal Formulation: With patient/family Time For Goal Achievement: 12/21/24 Potential to Achieve Goals: Good Progress towards PT goals: Progressing toward goals    Frequency    Min 2X/week      PT Plan      Co-evaluation              AM-PAC PT 6 Clicks Mobility   Outcome Measure  Help needed turning from your back to your side while in a flat bed without using bedrails?: A Little Help needed moving from lying on your back to sitting on the side of a flat bed without using bedrails?: A Little Help needed moving to and from a bed to a chair (including a wheelchair)?: A Little Help needed  standing up from a chair using your arms (e.g., wheelchair or bedside chair)?: A Little Help needed to walk in hospital room?: A Little Help needed climbing 3-5 steps with a railing? : Total 6 Click Score: 16    End of Session Equipment Utilized During Treatment: Oxygen Activity Tolerance: Patient limited by fatigue (Pt limited by SOB) Patient left: in chair;with call bell/phone within reach;with chair alarm set;with family/visitor present Nurse Communication: Mobility status PT Visit Diagnosis: Unsteadiness on feet (R26.81);Muscle weakness (generalized) (M62.81)     Time: 1340-1405 PT Time Calculation (min) (ACUTE ONLY): 25 min  Charges:    $Gait Training: 8-22 mins $Therapeutic Exercise: 8-22 mins PT General Charges $$ ACUTE PT VISIT: 1 Visit                     Sabra Morel, PT, DPT  Acute Rehabilitation Services         Office: [phone]      Sabra MARLA Morel 12/11/2024, 3:36 PM

## 2024-12-11 NOTE — Plan of Care (Signed)
  Problem: Clinical Measurements: Goal: Ability to maintain clinical measurements within normal limits will improve Outcome: Progressing Goal: Will remain free from infection Outcome: Progressing Goal: Respiratory complications will improve Outcome: Progressing   Problem: Activity: Goal: Risk for activity intolerance will decrease Outcome: Progressing   Problem: Coping: Goal: Level of anxiety will decrease Outcome: Progressing   Problem: Safety: Goal: Ability to remain free from injury will improve Outcome: Progressing

## 2024-12-11 NOTE — Consult Note (Signed)
 "  NAME:  Brandon [name], MRN:  [id number], DOB:  1941-05-30, LOS: 5 ADMISSION DATE:  12/06/2024, CONSULTATION DATE:  12/11/24 REFERRING MD:  Dr. Marcene, CHIEF COMPLAINT:  COPD exacerbation   History of Present Illness:   Brandon [name] is an 84 y/o M with a PMH significant for HTN, HLD, AAA w/ repair, Grade II COPD (FEV1 1.59 L, 55% predicted) who presents with shortness of breath, cough and acute hypoxic respiratory failure. Pulmonology consulted for evaluation and management of the latter.  He has a history of COPD, grade two, with multiple admissions for exacerbations. Shortness of breath started suddenly this morning around 1:30 AM. A similar episode occurred two weeks ago, but he thought he was improving until the current episode.  He denies using home oxygen and was not discharged with oxygen after previous hospitalizations. He experiences shortness of breath sometimes when lying flat and has noticed swelling in his feet and legs, which is worse than usual. No fever, chills, night sweats, or coughing up sputum or blood.  He uses an inhaler, Breztri , two puffs in the morning and two puffs in the evening, and rinses his mouth after use. He also has a nebulizer at home.  He quit smoking cigarettes 13 years ago. Pertinent  Medical History   Past Medical History:  Diagnosis Date   AAA (abdominal aortic aneurysm)    COPD (chronic obstructive pulmonary disease) (HCC)    DIC (disseminated intravascular coagulation) 09/09/2012   Hyperlipidemia    Hypertension    Leg wound, left 12/12/2013   Peripheral vascular disease    Respiratory failure, post-operative 09/09/2012   Shortness of breath    Significant Hospital Events: Including procedures, antibiotic start and stop dates in addition to other pertinent events   /7 - 1/14 admitted for acute hypoxic respiratory failure secondary to COPD exacerbation.  Was able to be weaned down to room air at the time of the discharge. 1/22 presented due  to worsening shortness of breath especially when lying down.  Admitted to the hospitalist service.  PCCM was consulted.  Lactic acid elevated. 1/23 echocardiogram EF 55 to 60%, no WMA, no valvular abnormality, RV normal. Interim History / Subjective:   Unchanged and still feels better than when he came to the hospital.  However still needing around 75% FiO2.  No other overnight event.  Objective    Blood pressure 97/84, pulse 66, temperature 98.2 F (36.8 C), temperature source Oral, resp. rate 19, height 5' 11 (1.803 m), weight 113.9 kg, SpO2 95%.    FiO2 (%):  [75 %-79 %] 77 %   Intake/Output Summary (Last 24 hours) at 12/11/2024 1624 Last data filed at 12/11/2024 0517 Gross per 24 hour  Intake 3 ml  Output 1650 ml  Net -1647 ml   Filed Weights   12/09/24 0500 12/10/24 0500 12/11/24 0500  Weight: 114 kg 113.8 kg 113.9 kg    Examination: General: Elderly male not appearing to be in respiratory distress. Lungs: clear to auscultation bilaterally.  Heart: regular rate rhythm, no murmur appreciated.  Abdomen: non tender, non distended. Normal BS.  Neuro: axox 3.  Hard of hearing..  Moving all extremities Left greater than right leg pitting edema.      Latest Ref Rng & Units 10/16/2024    3:27 PM  PFT Results  FVC-Pre L 2.85   FVC-Predicted Pre % 69   FVC-Post L 2.69   FVC-Predicted Post % 66   Pre FEV1/FVC % % 56   Post FEV1/FCV % %  58   FEV1-Pre L 1.59   FEV1-Predicted Pre % 55   FEV1-Post L 1.57   DLCO uncorrected ml/min/mmHg 14.09   DLCO UNC% % 57   DLCO corrected ml/min/mmHg 14.09   DLCO COR %Predicted % 57   DLVA Predicted % 82     LABS    PULMONARY No results for input(s): PHART, PCO2ART, PO2ART, HCO3, TCO2, O2SAT in the last 168 hours.  Invalid input(s): PCO2, PO2  CBC Recent Labs  Lab 12/09/24 0550 12/10/24 0204 12/11/24 0447  HGB 13.6 13.6 13.6  HCT 38.5* 39.4 40.1  WBC 18.6* 19.0* 18.7*  PLT 258 257 258    COAGULATION No  results for input(s): INR in the last 168 hours.  CARDIAC  No results for input(s): TROPONINI in the last 168 hours. Recent Labs  Lab 12/06/24 0514 12/06/24 0647  PROBNP 136.0 150.0     CHEMISTRY Recent Labs  Lab 12/06/24 9352 12/07/24 0532 12/08/24 0533 12/09/24 0550 12/10/24 0204 12/11/24 0447  NA  --  134* 134* 134* 134* 134*  K  --  4.7 4.7 4.2 4.6 4.9  CL  --  99 100 98 98 98  CO2  --  25 25 26 28 29   GLUCOSE  --  116* 127* 160* 159* 179*  BUN  --  19 23 24* 30* 31*  CREATININE  --  0.80 0.77 0.75 1.07 0.93  CALCIUM   --  8.5* 8.5* 8.5* 8.4* 8.5*  MG 2.1  --   --  2.3 2.5* 2.8*  PHOS 2.8  --   --   --   --   --    Estimated Creatinine Clearance: 79.9 mL/min (by C-G formula based on SCr of 0.93 mg/dL).   LIVER Recent Labs  Lab 12/08/24 0533 12/09/24 0550 12/10/24 0204 12/11/24 0447  AST 48* 22 22 15   ALT 102* 84* 82* 68*  ALKPHOS 85 83 72 73  BILITOT 0.5 0.4 0.5 0.4  PROT 5.9* 5.8* 5.9* 5.6*  ALBUMIN  2.9* 2.9* 3.0* 3.0*     INFECTIOUS Recent Labs  Lab 12/06/24 0647 12/06/24 0651 12/06/24 1959 12/08/24 0533  LATICACIDVEN  --  3.1* 4.0* 1.6  PROCALCITON <0.10  --   --   --      ENDOCRINE CBG (last 3)  Recent Labs    12/09/24 0850 12/10/24 0720 12/11/24 0747  GLUCAP 192* 146* 167*   Imaging: - Chest x-ray with bibasilar infiltrates. CT chest June 2026 done during previous admission: Showed emphysematous changes without any PE. PFT 2025 moderate obstruction with moderate reduction in DLCO, no BD response Echo moderate LVH, EF normal, bubble study negative.  Significant labs during this hospitalization: - Procalcitonin negative, proBNP normal,    Resolved problem list   Assessment and Plan  BAseline COPD Gold stge 2: No home oxygen use or recent smoking Acute exacerbation of COPD  Hypoxemic respiratory failure: ?  Bibasilar pneumonia:  Unclear cause for such severe hypoxemia. Based on previous experience on the patient the  patient did have resolution of hypoxemia after treatment for COPD.  - Continue current treatment with DuoNebs scheduled and as needed, budesonide  nebs scheduled. -Continue hypertonic nebs. -Continue antibiotics to cover for pneumonia -Continue Solu-Medrol  twice daily. -If patient continues to have persistent hypoxemia and needing high flow nasal cannula will have to consider CT chest with PE protocol in 1 to 2 days. -Goal SpO2 88-92%. - Lasix  stopped.  Pulmonary will continue to follow.   Total care time: 35 minutes  Care time was exclusive of separately billable procedures and treating other patients.  Care was necessary to treat or prevent imminent or life-threatening deterioration.   Care was time spent personally by me on the following activities: development of treatment plan with patient and/or surrogate as well as nursing, discussions with consultants, evaluation of patient's response to treatment, examination of patient, obtaining history from patient or surrogate, ordering and performing treatments and interventions, ordering and review of laboratory studies, ordering and review of radiographic studies and pulse oximetry.   Sammi JONETTA Fredericks, MD Pulmonary, Critical Care and Sleep Attending.   12/11/2024, 4:24 PM  "

## 2024-12-12 ENCOUNTER — Inpatient Hospital Stay (HOSPITAL_COMMUNITY)

## 2024-12-12 DIAGNOSIS — J9601 Acute respiratory failure with hypoxia: Secondary | ICD-10-CM | POA: Diagnosis not present

## 2024-12-12 LAB — CBC
HCT: 39 % (ref 39.0–52.0)
Hemoglobin: 13.2 g/dL (ref 13.0–17.0)
MCH: 32.9 pg (ref 26.0–34.0)
MCHC: 33.8 g/dL (ref 30.0–36.0)
MCV: 97.3 fL (ref 80.0–100.0)
Platelets: 241 10*3/uL (ref 150–400)
RBC: 4.01 MIL/uL — ABNORMAL LOW (ref 4.22–5.81)
RDW: 12.6 % (ref 11.5–15.5)
WBC: 16 10*3/uL — ABNORMAL HIGH (ref 4.0–10.5)
nRBC: 0 % (ref 0.0–0.2)

## 2024-12-12 LAB — COMPREHENSIVE METABOLIC PANEL WITH GFR
ALT: 58 U/L — ABNORMAL HIGH (ref 0–44)
AST: 14 U/L — ABNORMAL LOW (ref 15–41)
Albumin: 2.8 g/dL — ABNORMAL LOW (ref 3.5–5.0)
Alkaline Phosphatase: 67 U/L (ref 38–126)
Anion gap: 7 (ref 5–15)
BUN: 33 mg/dL — ABNORMAL HIGH (ref 8–23)
CO2: 29 mmol/L (ref 22–32)
Calcium: 8.5 mg/dL — ABNORMAL LOW (ref 8.9–10.3)
Chloride: 98 mmol/L (ref 98–111)
Creatinine, Ser: 0.97 mg/dL (ref 0.61–1.24)
GFR, Estimated: >60 mL/min
Glucose, Bld: 214 mg/dL — ABNORMAL HIGH (ref 70–99)
Potassium: 5.5 mmol/L — ABNORMAL HIGH (ref 3.5–5.1)
Sodium: 133 mmol/L — ABNORMAL LOW (ref 135–145)
Total Bilirubin: 0.4 mg/dL (ref 0.0–1.2)
Total Protein: 5.3 g/dL — ABNORMAL LOW (ref 6.5–8.1)

## 2024-12-12 LAB — C-REACTIVE PROTEIN: CRP: <0.5 mg/dL

## 2024-12-12 LAB — GLUCOSE, CAPILLARY
Glucose-Capillary: 141 mg/dL — ABNORMAL HIGH (ref 70–99)
Glucose-Capillary: 212 mg/dL — ABNORMAL HIGH (ref 70–99)

## 2024-12-12 LAB — PROCALCITONIN: Procalcitonin: <0.1 ng/mL

## 2024-12-12 LAB — PRO BRAIN NATRIURETIC PEPTIDE: Pro Brain Natriuretic Peptide: 101 pg/mL

## 2024-12-12 LAB — MAGNESIUM: Magnesium: 2.8 mg/dL — ABNORMAL HIGH (ref 1.7–2.4)

## 2024-12-12 LAB — MRSA NEXT GEN BY PCR, NASAL: MRSA by PCR Next Gen: NOT DETECTED

## 2024-12-12 MED ORDER — SODIUM ZIRCONIUM CYCLOSILICATE 10 G PO PACK
10.0000 g | PACK | Freq: Two times a day (BID) | ORAL | Status: AC
  Administered 2024-12-12 (×2): 10 g via ORAL
  Filled 2024-12-12 (×2): qty 1

## 2024-12-12 NOTE — Progress Notes (Signed)
 [facility] Progress Note Patient: Brandon [name] FMW:[id number] DOB: March 03, 1979  DOA: 12/06/2024 DOS: the patient was seen and examined on 12/12/2024  Brief Summary: Patient with PMH of COPD, HTN, HLD, AAA, depression, obesity presents to the hospital with complaints of progressively worsening cough and shortness of breath as well as confusion.  Significant events: 1/7 - 1/14 admitted for acute hypoxic respiratory failure secondary to COPD exacerbation.  Was able to be weaned down to room air at the time of the discharge. 1/22 presented due to worsening shortness of breath especially when lying down.  Admitted to the hospitalist service.  PCCM was consulted.  Lactic acid elevated.  Being treated for acute hypoxic respiratory failure secondary to COPD and pneumonia. 1/23 echocardiogram EF 55 to 60%, no WMA, no valvular abnormality, RV normal. 1/24 echo bubble study negative for any shunt. 1/25 switch to oral antibiotics  Consults: PCCM   Assessment and plan:  Acute hypoxic respiratory failure. Secondary to COPD exacerbation as well as acute on chronic diastolic CHF. Community-acquired pneumonia. Patient was able to be weaned down to room air during his last admission when he was presented to the hospital with COPD exacerbation. Currently on heated high flow oxygen, I-S flutter valve added for pulmonary toiletry, encouraged to sit in chair and daytime Initially was on IV ceftriaxone  and azithromycin .  Now on oral cefadroxil and completed 5 days of azithromycin . Blood cultures negative.  MRSA PCR negative. Procalcitonin is negative. Respiratory pathogen panel negative. On IV Solu-Medrol  along with supplemental oxygen and nebulizer treatments, PCCM following.  Echocardiogram stable with EF 60%, no wall motion abnormality, RV pressures normal, no shunt on bubble study.  Acute on chronic HFpEF.  EF 55 to 60% When appropriately diuresed no crackles monitor  Oropharyngeal  candidiasis. On Diflucan . Most likely due to his recent use of steroid. Most likely will worsen with ongoing use of antibiotics and steroids.  History of SDH as well as ICH. No focal deficit. Intermittent confusion noted by family in the setting of hypoxia.  HTN. On amlodipine . Given significant lower extremity edema would recommend to discontinue this medication upon discharge.  History of CAD. PAD AAA with repair. Currently no chest pain. Echocardiogram no wall motion abnormality. EKG nonischemic. Continue aspirin   Leukocytosis. This is mostly steroid-induced. For now we will monitor.  Lactic acidosis. Likely due to increased work of breathing.  Resolved.  LFT elevation. AST ALT ratio 1 is to 2.  AST normalized.  Hard of hearing. Noted.  Obesity Class 2 Body mass index is 36.77 kg/m.  Placing the pt at higher risk of poor outcomes.   Code Status: Full Code    DVT Prophylaxis: heparin  injection 5,000 Units Start: 12/06/24 1400 Place TED hose Start: 12/06/24 0813 SCDs Start: 12/06/24 0646     Physical exam. Vitals:   12/12/24 0400 12/12/24 0500 12/12/24 0813 12/12/24 0818  BP: (!) 112/59  117/68   Pulse: 64  62 (!) 58  Resp: (!) 21  (!) 23 (!) 23  Temp:   97.8 F (36.6 C)   TempSrc: Oral  Oral   SpO2: 90%  94%   Weight:  116.9 kg    Height:       Awake Alert, No new F.N deficits, Normal affect [place].AT,PERRAL Supple Neck, No JVD,   Symmetrical Chest wall movement, moderate air movement bilaterally, no significant wheezing RRR,No Gallops, Rubs or new Murmurs,  +ve B.Sounds, Abd Soft, No tenderness,   No Cyanosis, Clubbing or edema    Subjective:  Feeling better.  Still has significant cough.  No nausea no vomiting.  No chest pain.  Family Communication: Discussed with family on phone on 1/26.  Disposition Plan: Status is: Inpatient Remains inpatient appropriate because: Monitor for improvement in respiratory status.  Still on heated high flow.    Planned Discharge Destination: TBD.  SNF recommended by PT. Diet: Diet Order             Diet Heart Fluid consistency: Thin  Diet effective now                    Data Review:   Patient Lines/Drains/Airways Status     Active Line/Drains/Airways     Name Placement date Placement time Site Days   Peripheral IV 18 G Left Antecubital --  --  Antecubital  --   Peripheral IV 12/06/24 20 G Right Antecubital 12/06/24  0639  Antecubital  6             Inpatient Medications  Scheduled Meds:  acidophilus  1 capsule Oral TID with meals   aspirin   81 mg Oral Daily   benzonatate   100 mg Oral TID   budesonide  (PULMICORT ) nebulizer solution  0.25 mg Nebulization BID   cefUROXime   500 mg Oral BID WC   citalopram   20 mg Oral Daily   dextromethorphan   30 mg Oral BID   fluconazole   200 mg Oral Daily   guaiFENesin   600 mg Oral BID   heparin   5,000 Units Subcutaneous Q8H   ipratropium-albuterol   3 mL Nebulization Q6H   methylPREDNISolone  (SOLU-MEDROL ) injection  40 mg Intravenous Q12H   pantoprazole   40 mg Oral Daily   senna-docusate  2 tablet Oral BID   sodium chloride  flush  3 mL Intravenous Q12H   sodium chloride  HYPERTONIC  4 mL Nebulization Daily   sodium zirconium cyclosilicate   10 g Oral BID   Continuous Infusions: PRN Meds:.acetaminophen  **OR** acetaminophen , albuterol , bisacodyl , ondansetron  **OR** ondansetron  (ZOFRAN ) IV, oxyCODONE , traZODone   DVT Prophylaxis  heparin  injection 5,000 Units Start: 12/06/24 1400 Place TED hose Start: 12/06/24 0813 SCDs Start: 12/06/24 0646       Recent Labs  Lab 12/08/24 0533 12/09/24 0550 12/10/24 0204 12/11/24 0447 12/12/24 0323  WBC 18.9* 18.6* 19.0* 18.7* 16.0*  HGB 14.0 13.6 13.6 13.6 13.2  HCT 40.9 38.5* 39.4 40.1 39.0  PLT 233 258 257 258 241  MCV 97.8 94.1 95.6 97.1 97.3  MCH 33.5 33.3 33.0 32.9 32.9  MCHC 34.2 35.3 34.5 33.9 33.8  RDW 12.7 12.6 12.7 12.6 12.6    Recent Labs  Lab 12/06/24 0647  12/06/24 0651 12/06/24 1959 12/07/24 0532 12/08/24 0533 12/09/24 0550 12/10/24 0204 12/11/24 0447 12/12/24 0323  NA  --   --   --    < > 134* 134* 134* 134* 133*  K  --   --   --    < > 4.7 4.2 4.6 4.9 5.5*  CL  --   --   --    < > 100 98 98 98 98  CO2  --   --   --    < > 25 26 28 29 29   ANIONGAP  --   --   --    < > 9 10 9 8 7   GLUCOSE  --   --   --    < > 127* 160* 159* 179* 214*  BUN  --   --   --    < > 23  24* 30* 31* 33*  CREATININE  --   --   --    < > 0.77 0.75 1.07 0.93 0.97  AST  --   --   --   --  48* 22 22 15  14*  ALT  --   --   --   --  102* 84* 82* 68* 58*  ALKPHOS  --   --   --   --  85 83 72 73 67  BILITOT  --   --   --   --  0.5 0.4 0.5 0.4 0.4  ALBUMIN   --   --   --   --  2.9* 2.9* 3.0* 3.0* 2.8*  CRP  --   --   --   --   --   --   --   --  <0.5  PROCALCITON <0.10  --   --   --   --   --   --   --  <0.10  LATICACIDVEN  --  3.1* 4.0*  --  1.6  --   --   --   --   MG 2.1  --   --   --   --  2.3 2.5* 2.8* 2.8*  PHOS 2.8  --   --   --   --   --   --   --   --   CALCIUM   --   --   --    < > 8.5* 8.5* 8.4* 8.5* 8.5*   < > = values in this interval not displayed.      Recent Labs  Lab 12/06/24 [phone] 12/06/24 9348 12/06/24 1959 12/07/24 0532 12/08/24 0533 12/09/24 0550 12/10/24 0204 12/11/24 0447 12/12/24 0323  CRP  --   --   --   --   --   --   --   --  <0.5  PROCALCITON <0.10  --   --   --   --   --   --   --  <0.10  LATICACIDVEN  --  3.1* 4.0*  --  1.6  --   --   --   --   MG 2.1  --   --   --   --  2.3 2.5* 2.8* 2.8*  CALCIUM   --   --   --    < > 8.5* 8.5* 8.4* 8.5* 8.5*   < > = values in this interval not displayed.    --------------------------------------------------------------------------------------------------------------- Lab Results  Component Value Date   CHOL 141 02/03/2023   HDL 40 02/03/2023   LDLCALC 79 02/03/2023   TRIG 123 02/03/2023   CHOLHDL 4.7 12/05/2015    No results found for: HGBA1C No results for input(s): TSH,  T4TOTAL, FREET4, T3FREE, THYROIDAB in the last 72 hours. No results for input(s): VITAMINB12, FOLATE, FERRITIN, TIBC, IRON, RETICCTPCT in the last 72 hours. ------------------------------------------------------------------------------------------------------------------ Cardiac Enzymes No results for input(s): CKMB, TROPONINI, MYOGLOBIN in the last 168 hours.  Invalid input(s): CK  Micro Results Recent Results (from the past 240 hours)  Culture, blood (Routine X 2) w Reflex to ID Panel     Status: None   Collection Time: 12/06/24  5:48 AM   Specimen: BLOOD  Result Value Ref Range Status   Specimen Description BLOOD LEFT ANTECUBITAL  Final   Special Requests   Final    BOTTLES DRAWN AEROBIC AND ANAEROBIC Blood Culture results may not be optimal due to an inadequate volume of blood received in  culture bottles   Culture   Final    NO GROWTH 5 DAYS Performed at [facility], [address], [place], [place] [address]    Report Status 12/11/2024 FINAL  Final  Culture, blood (Routine X 2) w Reflex to ID Panel     Status: None   Collection Time: 12/06/24  5:53 AM   Specimen: BLOOD  Result Value Ref Range Status   Specimen Description BLOOD RIGHT ANTECUBITAL  Final   Special Requests   Final    BOTTLES DRAWN AEROBIC AND ANAEROBIC Blood Culture adequate volume   Culture   Final    NO GROWTH 5 DAYS Performed at [facility], [address], [place], [place] [address]    Report Status 12/11/2024 FINAL  Final  Respiratory (~20 pathogens) panel by PCR     Status: None   Collection Time: 12/06/24 10:10 AM   Specimen: Nasal Mucosa; Respiratory  Result Value Ref Range Status   Adenovirus NOT DETECTED NOT DETECTED Final   Coronavirus 229E NOT DETECTED NOT DETECTED Final    Comment: (NOTE) The Coronavirus on the Respiratory Panel, DOES NOT test for the novel  Coronavirus (2019 nCoV)    Coronavirus HKU1 NOT DETECTED NOT DETECTED Final   Coronavirus  NL63 NOT DETECTED NOT DETECTED Final   Coronavirus OC43 NOT DETECTED NOT DETECTED Final   Metapneumovirus NOT DETECTED NOT DETECTED Final   Rhinovirus / Enterovirus NOT DETECTED NOT DETECTED Final   Influenza A NOT DETECTED NOT DETECTED Final   Influenza B NOT DETECTED NOT DETECTED Final   Parainfluenza Virus 1 NOT DETECTED NOT DETECTED Final   Parainfluenza Virus 2 NOT DETECTED NOT DETECTED Final   Parainfluenza Virus 3 NOT DETECTED NOT DETECTED Final   Parainfluenza Virus 4 NOT DETECTED NOT DETECTED Final   Respiratory Syncytial Virus NOT DETECTED NOT DETECTED Final   Bordetella pertussis NOT DETECTED NOT DETECTED Final   Bordetella Parapertussis NOT DETECTED NOT DETECTED Final   Chlamydophila pneumoniae NOT DETECTED NOT DETECTED Final   Mycoplasma pneumoniae NOT DETECTED NOT DETECTED Final    Comment: Performed at [facility], [address] [place], [place] [address]  MRSA Next Gen by PCR, Nasal     Status: None   Collection Time: 12/06/24 10:10 AM   Specimen: Nasal Mucosa; Nasal Swab  Result Value Ref Range Status   MRSA by PCR Next Gen NOT DETECTED NOT DETECTED Final    Comment: (NOTE) The GeneXpert MRSA Assay (FDA approved for NASAL specimens only), is one component of a comprehensive MRSA colonization surveillance program. It is not intended to diagnose MRSA infection nor to guide or monitor treatment for MRSA infections. Test performance is not FDA approved in patients less than 80 years old. Performed at [facility], [address] [place], [place] [address]   Expectorated Sputum Assessment w Gram Stain, Rflx to Resp Cult     Status: None   Collection Time: 12/09/24  5:50 AM   Specimen: Expectorated Sputum  Result Value Ref Range Status   Specimen Description EXPECTORATED SPUTUM  Final   Special Requests NONE  Final   Sputum evaluation   Final    THIS SPECIMEN IS ACCEPTABLE FOR SPUTUM CULTURE Performed at [facility], [address],  [place], [place] [address]    Report Status 12/09/2024 FINAL  Final  Culture, Respiratory w Gram Stain     Status: None   Collection Time: 12/09/24  5:50 AM  Result  Value Ref Range Status   Specimen Description EXPECTORATED SPUTUM  Final   Special Requests NONE Reflexed from [id number]  Final   Gram Stain   Final    FEW WBC PRESENT, PREDOMINANTLY PMN FEW GRAM POSITIVE COCCI RARE BUDDING YEAST SEEN    Culture   Final    FEW Normal respiratory flora-no Staph aureus or Pseudomonas seen Performed at [facility], [address], [place], [place] [address]    Report Status 12/11/2024 FINAL  Final  MRSA Next Gen by PCR, Nasal     Status: None   Collection Time: 12/12/24  5:59 AM   Specimen: Nasal Mucosa; Nasal Swab  Result Value Ref Range Status   MRSA by PCR Next Gen NOT DETECTED NOT DETECTED Final    Comment: (NOTE) The GeneXpert MRSA Assay (FDA approved for NASAL specimens only), is one component of a comprehensive MRSA colonization surveillance program. It is not intended to diagnose MRSA infection nor to guide or monitor treatment for MRSA infections. Test performance is not FDA approved in patients less than 76 years old. Performed at [facility], [address], [place], [place] [address]     Radiology Reports  DG Chest [place] 1 View Result Date: 12/12/2024 EXAM: 1 VIEW(S) XRAY OF THE CHEST 12/12/2024 06:46:23 AM COMPARISON: 12/09/2024 CLINICAL HISTORY: Shortness of breath. FINDINGS: LUNGS AND PLEURA: Decreased pleural effusions. Improved aeration of lung bases with persistent retrocardiac opacities. No pneumothorax. HEART AND MEDIASTINUM: Cardiomegaly. No acute abnormality of the mediastinal silhouette. BONES AND SOFT TISSUES: No acute osseous abnormality. IMPRESSION: 1. Improved aeration of the lung bases with persistent retrocardiac opacities. 2. Decreased pleural effusions. 3. Cardiomegaly. Electronically signed by: Evalene Coho MD 12/12/2024 07:07 AM EST RP Workstation:  [id number]      Signature  -   Lavada Stank M.D on 12/12/2024 at 9:44 AM   -  To page go to [email or web address]

## 2024-12-12 NOTE — Plan of Care (Signed)
" °  Problem: Education: Goal: Knowledge of General Education information will improve Description: Including pain rating scale, medication(s)/side effects and non-pharmacologic comfort measures Outcome: Progressing   Problem: Health Behavior/Discharge Planning: Goal: Ability to manage health-related needs will improve Outcome: Progressing   Problem: Clinical Measurements: Goal: Respiratory complications will improve Outcome: Progressing   Problem: Activity: Goal: Risk for activity intolerance will decrease Outcome: Progressing   Problem: Education: Goal: Knowledge of General Education information will improve Description: Including pain rating scale, medication(s)/side effects and non-pharmacologic comfort measures Outcome: Progressing   Problem: Education: Goal: Knowledge of General Education information will improve Description: Including pain rating scale, medication(s)/side effects and non-pharmacologic comfort measures Outcome: Progressing   Problem: Health Behavior/Discharge Planning: Goal: Ability to manage health-related needs will improve Outcome: Progressing   Problem: Clinical Measurements: Goal: Respiratory complications will improve Outcome: Progressing   Problem: Activity: Goal: Risk for activity intolerance will decrease Outcome: Progressing   Problem: Education: Goal: Knowledge of General Education information will improve Description: Including pain rating scale, medication(s)/side effects and non-pharmacologic comfort measures Outcome: Progressing   Problem: Health Behavior/Discharge Planning: Goal: Ability to manage health-related needs will improve Outcome: Progressing   Problem: Clinical Measurements: Goal: Respiratory complications will improve Outcome: Progressing   Problem: Activity: Goal: Risk for activity intolerance will decrease Outcome: Progressing   "

## 2024-12-12 NOTE — Progress Notes (Signed)
 Called by RN that patient SPO2 was low 87-89%. Sitting in chair. Upon RT arrival patient is sitting up in chair eating lunch. SPO2 88-89%. No distress. Asked patient if he had any trouble breathing, at which patient stated he felt fine. Increased oxygen from 70% back to 75% and will wean down once patient is done eating and back in bed. Updated RN and to call if he looked in distress or SPO2 dropped lower than 88%. RT will continue to monitor.

## 2024-12-12 NOTE — Plan of Care (Signed)
" °  Problem: Clinical Measurements: Goal: Ability to maintain clinical measurements within normal limits will improve Outcome: Progressing Goal: Will remain free from infection Outcome: Progressing Goal: Respiratory complications will improve Outcome: Progressing   Problem: Activity: Goal: Risk for activity intolerance will decrease Outcome: Progressing   Problem: Nutrition: Goal: Adequate nutrition will be maintained Outcome: Progressing   Problem: Coping: Goal: Level of anxiety will decrease Outcome: Progressing   Problem: Safety: Goal: Ability to remain free from injury will improve Outcome: Progressing   "

## 2024-12-12 NOTE — Progress Notes (Signed)
 "  NAME:  Brandon [name], MRN:  [id number], DOB:  [birth year], LOS: 6 ADMISSION DATE:  12/06/2024, CONSULTATION DATE:  12/12/24 REFERRING MD:  Dr. Marcene, CHIEF COMPLAINT:  COPD exacerbation   History of Present Illness:   Brandon [name] is an 84 y/o M with a PMH significant for HTN, HLD, AAA w/ repair, Grade II COPD (FEV1 1.59 L, 55% predicted) who presents with shortness of breath, cough and acute hypoxic respiratory failure. Pulmonology consulted for evaluation and management of the latter.  He has a history of COPD, grade two, with multiple admissions for exacerbations. Shortness of breath started suddenly this morning around 1:30 AM. A similar episode occurred two weeks ago, but he thought he was improving until the current episode.  He denies using home oxygen and was not discharged with oxygen after previous hospitalizations. He experiences shortness of breath sometimes when lying flat and has noticed swelling in his feet and legs, which is worse than usual. No fever, chills, night sweats, or coughing up sputum or blood.  He uses an inhaler, Breztri , two puffs in the morning and two puffs in the evening, and rinses his mouth after use. He also has a nebulizer at home.  He quit smoking cigarettes 13 years ago. Pertinent  Medical History   Past Medical History:  Diagnosis Date   AAA (abdominal aortic aneurysm)    COPD (chronic obstructive pulmonary disease) (HCC)    DIC (disseminated intravascular coagulation) 09/09/2012   Hyperlipidemia    Hypertension    Leg wound, left 12/12/2013   Peripheral vascular disease    Respiratory failure, post-operative 09/09/2012   Shortness of breath    Significant Hospital Events: Including procedures, antibiotic start and stop dates in addition to other pertinent events   /7 - 1/14 admitted for acute hypoxic respiratory failure secondary to COPD exacerbation.  Was able to be weaned down to room air at the time of the discharge. 1/22 presented due  to worsening shortness of breath especially when lying down.  Admitted to the hospitalist service.  PCCM was consulted.  Lactic acid elevated. 1/23 echocardiogram EF 55 to 60%, no WMA, no valvular abnormality, RV normal. Interim History / Subjective:   Unchanged since yesterday.  Continues to feel better.  However still needing high oxygen though FiO2 decreased to 70%. Objective    Blood pressure 111/66, pulse 76, temperature 98 F (36.7 C), temperature source Oral, resp. rate (!) 24, height 5' 11 (1.803 m), weight 116.9 kg, SpO2 90%.    FiO2 (%):  [70 %-85 %] 75 %   Intake/Output Summary (Last 24 hours) at 12/12/2024 1649 Last data filed at 12/12/2024 1109 Gross per 24 hour  Intake 6 ml  Output 2000 ml  Net -1994 ml   Filed Weights   12/10/24 0500 12/11/24 0500 12/12/24 0500  Weight: 113.8 kg 113.9 kg 116.9 kg    Examination: General: Elderly male not appearing to be in respiratory distress. Lungs: Clear lungs. Heart: regular rate rhythm, no murmur appreciated.  Abdomen: non tender, non distended. Normal BS.  Neuro: axox 3.  Hard of hearing..  Moving all extremities Left greater than right leg pitting edema.      Latest Ref Rng & Units 10/16/2024    3:27 PM  PFT Results  FVC-Pre L 2.85   FVC-Predicted Pre % 69   FVC-Post L 2.69   FVC-Predicted Post % 66   Pre FEV1/FVC % % 56   Post FEV1/FCV % % 58   FEV1-Pre L 1.59  FEV1-Predicted Pre % 55   FEV1-Post L 1.57   DLCO uncorrected ml/min/mmHg 14.09   DLCO UNC% % 57   DLCO corrected ml/min/mmHg 14.09   DLCO COR %Predicted % 57   DLVA Predicted % 82     LABS    PULMONARY No results for input(s): PHART, PCO2ART, PO2ART, HCO3, TCO2, O2SAT in the last 168 hours.  Invalid input(s): PCO2, PO2  CBC Recent Labs  Lab 12/10/24 0204 12/11/24 0447 12/12/24 0323  HGB 13.6 13.6 13.2  HCT 39.4 40.1 39.0  WBC 19.0* 18.7* 16.0*  PLT 257 258 241    COAGULATION No results for input(s): INR in the  last 168 hours.  CARDIAC  No results for input(s): TROPONINI in the last 168 hours. Recent Labs  Lab 12/06/24 0514 12/06/24 0647 12/12/24 0323  PROBNP 136.0 150.0 101.0     CHEMISTRY Recent Labs  Lab 12/06/24 9352 12/07/24 0532 12/08/24 0533 12/09/24 0550 12/10/24 0204 12/11/24 0447 12/12/24 0323  NA  --    < > 134* 134* 134* 134* 133*  K  --    < > 4.7 4.2 4.6 4.9 5.5*  CL  --    < > 100 98 98 98 98  CO2  --    < > 25 26 28 29 29   GLUCOSE  --    < > 127* 160* 159* 179* 214*  BUN  --    < > 23 24* 30* 31* 33*  CREATININE  --    < > 0.77 0.75 1.07 0.93 0.97  CALCIUM   --    < > 8.5* 8.5* 8.4* 8.5* 8.5*  MG 2.1  --   --  2.3 2.5* 2.8* 2.8*  PHOS 2.8  --   --   --   --   --   --    < > = values in this interval not displayed.   Estimated Creatinine Clearance: 77.6 mL/min (by C-G formula based on SCr of 0.97 mg/dL).   LIVER Recent Labs  Lab 12/08/24 0533 12/09/24 0550 12/10/24 0204 12/11/24 0447 12/12/24 0323  AST 48* 22 22 15  14*  ALT 102* 84* 82* 68* 58*  ALKPHOS 85 83 72 73 67  BILITOT 0.5 0.4 0.5 0.4 0.4  PROT 5.9* 5.8* 5.9* 5.6* 5.3*  ALBUMIN  2.9* 2.9* 3.0* 3.0* 2.8*     INFECTIOUS Recent Labs  Lab 12/06/24 0647 12/06/24 0651 12/06/24 1959 12/08/24 0533 12/12/24 0323  LATICACIDVEN  --  3.1* 4.0* 1.6  --   PROCALCITON <0.10  --   --   --  <0.10     ENDOCRINE CBG (last 3)  Recent Labs    12/11/24 0747 12/12/24 0818 12/12/24 1229  GLUCAP 167* 141* 212*   Imaging: - Chest x-ray with bibasilar infiltrates. CT chest June 2026 done during previous admission: Showed emphysematous changes without any PE. PFT 2025 moderate obstruction with moderate reduction in DLCO, no BD response Echo moderate LVH, EF normal, bubble study negative.  Significant labs during this hospitalization: - Procalcitonin negative, proBNP normal,    Resolved problem list   Assessment and Plan  BAseline COPD Gold stge 2: No home oxygen use or recent  smoking Acute exacerbation of COPD  Hypoxemic respiratory failure: ?  Bibasilar pneumonia:  Unclear cause for such severe hypoxemia likely related to COPD exacerbation.  Improving. Based on previous experience on the patient the patient did have resolution of hypoxemia after treatment for COPD.  - Continue current treatment with DuoNebs scheduled and as  needed, budesonide  nebs scheduled. -Continue hypertonic nebs. -Continue antibiotics to cover for pneumonia -Continue Solu-Medrol  twice daily. - Continue hold off on doing CT chest with PE protocol for now.  If continues to need higher oxygen need in next 1 to 2 days will get CT chest with PE protocol. -Goal SpO2 88-92%. - Lasix  stopped.  Pulmonary will continue to follow.   Total care time: 34 minutes   Care time was exclusive of separately billable procedures and treating other patients.  Care was necessary to treat or prevent imminent or life-threatening deterioration.   Care was time spent personally by me on the following activities: development of treatment plan with patient and/or surrogate as well as nursing, discussions with consultants, evaluation of patient's response to treatment, examination of patient, obtaining history from patient or surrogate, ordering and performing treatments and interventions, ordering and review of laboratory studies, ordering and review of radiographic studies and pulse oximetry.   Sammi JONETTA Fredericks, MD Pulmonary, Critical Care and Sleep Attending.   12/12/2024, 4:49 PM  "

## 2024-12-12 NOTE — Care Management Important Message (Signed)
 Important Message  Patient Details  Name: Brandon [name] MRN: [id number] Date of Birth: Apr 01, 1941   Important Message Given:  Yes - Medicare IM   IM given on 11/10/2025  Claretta Deed 12/12/2024, 10:27 AM

## 2024-12-12 NOTE — Progress Notes (Signed)
 Physical Therapy Treatment Patient Details Name: Brandon [name] MRN: [id number] DOB: [birth year] Today's Date: 12/12/2024   History of Present Illness 84 y.o male presents [facility] on 1/22 with sudden onset SOB. Currently on 45L HHFNC. PMH includes: HTN, HLD, AAA s/p repair 2013, COPD, and PVD.    PT Comments  Pt demonstrates gradual improvements in activity tolerance despite continued SOB and need for heated high flow oxygen delivery. Pt seated in recliner upon arrival. Pt benefits from further education regarding hand placement to facilitate STS and demonstrates improved carryover during session. Pt tolerates x10 short bouts of forward and backward ambulation from recliner, requiring rest break after 6 laps. Pt demonstrates improved balance stepping forward and backward with walker. Pt tolerates seated exercises well with added complexity of movements through multiple planes. Pt would benefit from continued PT services focused on bed mobility, balance, transfers, and progressing gait. <3hrs rehab recommended at this time, however, family prefers [facility] PT and pt likely to progress with improved respiratory status.     If plan is discharge home, recommend the following: A little help with walking and/or transfers;A little help with bathing/dressing/bathroom;Assistance with cooking/housework;Assist for transportation;Help with stairs or ramp for entrance;Direct supervision/assist for medications management   Can travel by private vehicle     No (Oxygen requirements)  Equipment Recommendations  Rolling walker (2 wheels);BSC/3in1    Recommendations for Other Services       Precautions / Restrictions Precautions Precautions: Fall Recall of Precautions/Restrictions: Intact Precaution/Restrictions Comments: Monitor O2, on 40L HHFNC Restrictions Weight Bearing Restrictions Per Provider Order: No     Mobility  Bed Mobility               General bed mobility comments: Pt seated in recliner  upon arrival.    Transfers Overall transfer level: Needs assistance Equipment used: Rolling walker (2 wheels) Transfers: Sit to/from Stand Sit to Stand: Contact guard assist           General transfer comment: Pt demonstates poor carryover of education provided yesterday regarding hand placement for STS, corrects hand placement with cues and demonstrates good carryover for remainder of session.    Ambulation/Gait Ambulation/Gait assistance: Contact guard assist Gait Distance (Feet): 6 Feet (6' fwd and bkwd x10) Assistive device: Rolling walker (2 wheels) Gait Pattern/deviations: Step-through pattern, Decreased stride length, Trunk flexed, Narrow base of support   Gait velocity interpretation: <1.31 ft/sec, indicative of household ambulator   General Gait Details: Pt ambulates forward and backward with walker, tolerating 6 laps prior to seated rest break and completes 10 laps total. Pt demonstrates decreased fatigue but continues to be SOB with activity. O2 87-90% throughout ambulation. Pt looks at the floor for foot placement when stepping backward. Further ambulation limited by high flow O2 requiring O2 delivery direct from wall.   Stairs             Wheelchair Mobility     Tilt Bed    Modified Rankin (Stroke Patients Only)       Balance Overall balance assessment: Needs assistance Sitting-balance support: No upper extremity supported, Feet supported Sitting balance-Leahy Scale: Good Sitting balance - Comments: No sitting balance concerns.   Standing balance support: Bilateral upper extremity supported, During functional activity, Reliant on assistive device for balance Standing balance-Leahy Scale: Poor Standing balance comment: Pt reliant on walker andCGA to maintain balance in standing. No overt LOB occurs during session. Improvements in balance with multidirectional stepping.  Communication  Communication Communication: Impaired Factors Affecting Communication: Hearing impaired  Cognition Arousal: Alert Behavior During Therapy: WFL for tasks assessed/performed   PT - Cognitive impairments: No apparent impairments                         Following commands: Intact      Cueing Cueing Techniques: Verbal cues, Tactile cues, Visual cues  Exercises General Exercises - Lower Extremity Ankle Circles/Pumps: AROM, Both, 10 reps, Seated Long Arc Quad: AROM, Both, 10 reps, Seated Hip Flexion/Marching: AROM, 10 reps, Seated (with abd and add to clear lotional bottle placed on floor for visual feedback)    General Comments General comments (skin integrity, edema, etc.): SpO2 87-93% throughout session on HHFNC. No new skin abnormalities noted.      Pertinent Vitals/Pain Pain Assessment Pain Assessment: No/denies pain    Home Living                          Prior Function            PT Goals (current goals can now be found in the care plan section) Acute Rehab PT Goals Patient Stated Goal: Get well, improve strength and endurance again PT Goal Formulation: With patient/family Time For Goal Achievement: 12/21/24 Potential to Achieve Goals: Good Progress towards PT goals: Progressing toward goals    Frequency    Min 2X/week      PT Plan      Co-evaluation              AM-PAC PT 6 Clicks Mobility   Outcome Measure  Help needed turning from your back to your side while in a flat bed without using bedrails?: A Little Help needed moving from lying on your back to sitting on the side of a flat bed without using bedrails?: A Little Help needed moving to and from a bed to a chair (including a wheelchair)?: A Little Help needed standing up from a chair using your arms (e.g., wheelchair or bedside chair)?: A Little Help needed to walk in hospital room?: A Little Help needed climbing 3-5 steps with a railing? : Total 6 Click Score: 16     End of Session Equipment Utilized During Treatment: Oxygen Activity Tolerance: Patient limited by fatigue (Pt limited by SOB) Patient left: in chair;with call bell/phone within reach Nurse Communication: Mobility status PT Visit Diagnosis: Unsteadiness on feet (R26.81);Muscle weakness (generalized) (M62.81)     Time: 1510-1540 PT Time Calculation (min) (ACUTE ONLY): 30 min  Charges:    $Gait Training: 8-22 mins $Therapeutic Exercise: 8-22 mins PT General Charges $$ ACUTE PT VISIT: 1 Visit                     Sabra Morel, PT, DPT  Acute Rehabilitation Services         Office: [phone]      Sabra MARLA Morel 12/12/2024, 3:54 PM

## 2024-12-12 NOTE — Evaluation (Signed)
 Clinical/Bedside Swallow Evaluation Patient Details  Name: Brandon [name] MRN: [id number] Date of Birth: 06/30/1941  Today's Date: 12/12/2024 Time: SLP Start Time (ACUTE ONLY): 1119 SLP Stop Time (ACUTE ONLY): 1126 SLP Time Calculation (min) (ACUTE ONLY): 7 min  Past Medical History:  Past Medical History:  Diagnosis Date   AAA (abdominal aortic aneurysm)    COPD (chronic obstructive pulmonary disease) (HCC)    DIC (disseminated intravascular coagulation) 09/09/2012   Hyperlipidemia    Hypertension    Leg wound, left 12/12/2013   Peripheral vascular disease    Respiratory failure, post-operative 09/09/2012   Shortness of breath    Past Surgical History:  Past Surgical History:  Procedure Laterality Date   ABDOMINAL AORTIC ANEURYSM REPAIR  09/08/2012   Procedure: ANEURYSM ABDOMINAL AORTIC REPAIR;  Surgeon: Lynwood JONETTA Collum, MD;  Location: [facility] OR;  Service: Vascular;  Laterality: N/A;  open repair ruptured abdomenal aortic aneurysm   CYSTOSCOPY W/ RETROGRADES Bilateral 01/24/2023   Procedure: FLEXIBLE CYSTOSCOPY;  Surgeon: Carolee Sherwood JONETTA DOUGLAS, MD;  Location: [facility] ORS;  Service: Urology;  Laterality: Bilateral;  60 MINS   GANGLION CYST EXCISION     Right wrist   MASS EXCISION N/A 01/24/2023   Procedure: EXCISION PENILE  MASS;  Surgeon: Carolee Sherwood JONETTA DOUGLAS, MD;  Location: [facility] OR[facility];  Service: Urology;  Laterality: N/A;   HPI:  Brandon [name] is 84 yo M who presented to the hospital 1/22 with complaints of progressively worsening cough and shortness of breath as well as confusion. CXR 1/28: Decreased pleural effusions. Improved aeration of lung bases with persistent  retrocardiac opacities. No pneumothorax.  No prior ST for dysphagia, cognitive linguistic evaluation and treatment in Sept 2023. Pt with PMH of COPD, HTN, HLD, AAA, depression, obesity    Assessment / Plan / Recommendation  Clinical Impression  Pt presents with functional swallowing as assessed clinically.  Pt tolerated all  consistencies trialed, including straw sips of thin liquid without any clincal s/s of aspiration and exhibited good oral clearance of solids.  Pt on HHFNC requiring 40L @ 70%, but CXR from this morning showing Decreased pleural effusions. Improved aeration of lung bases with persistent  retrocardiac opacities. No pneumothorax. Vocal quality harsh, but pt states this is consistent with baseline.  Pt denies prior hx pna. Recommend continuing current diet.  Pt has no further ST needs.  SLP will sign off.    Recommend regular texture diet with thin liquids.   SLP Visit Diagnosis: Dysphagia, unspecified (R13.10)    Aspiration Risk  Mild aspiration risk    Diet Recommendation Regular;Thin liquid    Liquid Administration via: Cup;Straw Medication Administration:  (as tolerated, no specific precautions) Supervision: Patient able to self feed Compensations: Slow rate;Small sips/bites Postural Changes: Seated upright at 90 degrees;Remain upright for at least 30 minutes after po intake    Other Recommendations Oral Care Recommendations: Oral care BID      Functional Status Assessment Patient has not had a recent decline in their functional status  Frequency and Duration  (N/A)          Prognosis Prognosis for improved oropharyngeal function:  (N/A)      Swallow Study   General HPI: Brandon [name] is 84 yo M who presented to the hospital 1/22 with complaints of progressively worsening cough and shortness of breath as well as confusion. CXR 1/28: Decreased pleural effusions. Improved aeration of lung bases with persistent  retrocardiac opacities. No pneumothorax.  No prior ST for dysphagia, cognitive linguistic  evaluation and treatment in Sept 2023. Pt with PMH of COPD, HTN, HLD, AAA, depression, obesity Type of Study: Bedside Swallow Evaluation Previous Swallow Assessment: none Diet Prior to this Study: Regular;Thin liquids (Level 0) Temperature Spikes Noted: No Respiratory Status:   (HHFNC 40L @ 70%) History of Recent Intubation: No Behavior/Cognition: Alert;Cooperative;Pleasant mood Oral Cavity Assessment: Within Functional Limits Oral Care Completed by SLP: No Oral Cavity - Dentition: Dentures, bottom;Dentures, top Vision: Functional for self-feeding Self-Feeding Abilities: Able to feed self Patient Positioning: Upright in bed Baseline Vocal Quality:  (Harsh) Volitional Cough:  (Fair) Volitional Swallow: Able to elicit    Oral/Motor/Sensory Function Overall Oral Motor/Sensory Function: Within functional limits   Ice Chips Ice chips: Not tested   Thin Liquid Thin Liquid: Within functional limits Presentation: Straw    Nectar Thick Nectar Thick Liquid: Not tested   Honey Thick Honey Thick Liquid: Not tested   Puree Puree: Within functional limits Presentation: Spoon   Solid     Solid: Within functional limits Presentation: Self Fed      Anette FORBES Grippe, MA, CCC-SLP Acute Rehabilitation Services Office: [phone] 12/12/2024,12:51 PM

## 2024-12-13 DIAGNOSIS — J9601 Acute respiratory failure with hypoxia: Secondary | ICD-10-CM | POA: Diagnosis not present

## 2024-12-13 LAB — COMPREHENSIVE METABOLIC PANEL WITH GFR
ALT: 51 U/L — ABNORMAL HIGH (ref 0–44)
AST: 11 U/L — ABNORMAL LOW (ref 15–41)
Albumin: 3.1 g/dL — ABNORMAL LOW (ref 3.5–5.0)
Alkaline Phosphatase: 67 U/L (ref 38–126)
Anion gap: 6 (ref 5–15)
BUN: 31 mg/dL — ABNORMAL HIGH (ref 8–23)
CO2: 29 mmol/L (ref 22–32)
Calcium: 8.5 mg/dL — ABNORMAL LOW (ref 8.9–10.3)
Chloride: 97 mmol/L — ABNORMAL LOW (ref 98–111)
Creatinine, Ser: 1.01 mg/dL (ref 0.61–1.24)
GFR, Estimated: >60 mL/min
Glucose, Bld: 227 mg/dL — ABNORMAL HIGH (ref 70–99)
Potassium: 5 mmol/L (ref 3.5–5.1)
Sodium: 132 mmol/L — ABNORMAL LOW (ref 135–145)
Total Bilirubin: 0.5 mg/dL (ref 0.0–1.2)
Total Protein: 5.4 g/dL — ABNORMAL LOW (ref 6.5–8.1)

## 2024-12-13 LAB — GLUCOSE, CAPILLARY: Glucose-Capillary: 165 mg/dL — ABNORMAL HIGH (ref 70–99)

## 2024-12-13 LAB — MAGNESIUM: Magnesium: 2.9 mg/dL — ABNORMAL HIGH (ref 1.7–2.4)

## 2024-12-13 MED ORDER — SODIUM ZIRCONIUM CYCLOSILICATE 10 G PO PACK
10.0000 g | PACK | Freq: Two times a day (BID) | ORAL | Status: AC
  Administered 2024-12-13: 10 g via ORAL
  Filled 2024-12-13 (×2): qty 1

## 2024-12-13 NOTE — Progress Notes (Addendum)
 "  NAME:  Brandon [name], MRN:  [id number], DOB:  12-17-1940, LOS: 7 ADMISSION DATE:  12/06/2024, CONSULTATION DATE:  12/13/24 REFERRING MD:  Dr. Marcene, CHIEF COMPLAINT:  COPD exacerbation   History of Present Illness:   Brandon [name] is an 84 y/o M with a PMH significant for HTN, HLD, AAA w/ repair, Grade II COPD (FEV1 1.59 L, 55% predicted) who presents with shortness of breath, cough and acute hypoxic respiratory failure. Pulmonology consulted for evaluation and management of the latter.  He has a history of COPD, grade two, with multiple admissions for exacerbations. Shortness of breath started suddenly this morning around 1:30 AM. A similar episode occurred two weeks ago, but he thought he was improving until the current episode.  He denies using home oxygen and was not discharged with oxygen after previous hospitalizations. He experiences shortness of breath sometimes when lying flat and has noticed swelling in his feet and legs, which is worse than usual. No fever, chills, night sweats, or coughing up sputum or blood.  He uses an inhaler, Breztri , two puffs in the morning and two puffs in the evening, and rinses his mouth after use. He also has a nebulizer at home.  He quit smoking cigarettes 13 years ago. Pertinent  Medical History   Past Medical History:  Diagnosis Date   AAA (abdominal aortic aneurysm)    COPD (chronic obstructive pulmonary disease) (HCC)    DIC (disseminated intravascular coagulation) 09/09/2012   Hyperlipidemia    Hypertension    Leg wound, left 12/12/2013   Peripheral vascular disease    Respiratory failure, post-operative 09/09/2012   Shortness of breath    Significant Hospital Events: Including procedures, antibiotic start and stop dates in addition to other pertinent events   /7 - 1/14 admitted for acute hypoxic respiratory failure secondary to COPD exacerbation.  Was able to be weaned down to room air at the time of the discharge. 1/22 presented due  to worsening shortness of breath especially when lying down.  Admitted to the hospitalist service.  PCCM was consulted.  Lactic acid elevated. 1/23 echocardiogram EF 55 to 60%, no WMA, no valvular abnormality, RV normal. Interim History / Subjective:  Continues to feel better.  Sister at bedside.  On high flow nasal cannula currently on 65% FiO2.   Objective    Blood pressure (!) 142/67, pulse 63, temperature 98.2 F (36.8 C), temperature source Oral, resp. rate 18, height 5' 11 (1.803 m), weight 118.2 kg, SpO2 92%.    FiO2 (%):  [65 %-75 %] 65 %   Intake/Output Summary (Last 24 hours) at 12/13/2024 1324 Last data filed at 12/13/2024 1158 Gross per 24 hour  Intake --  Output 2800 ml  Net -2800 ml   Filed Weights   12/11/24 0500 12/12/24 0500 12/13/24 0500  Weight: 113.9 kg 116.9 kg 118.2 kg    Examination: General: Elderly male not appearing to be in respiratory distress. Lungs: Clear to auscultation bilaterally. Heart: regular rate rhythm, no murmur appreciated.  Abdomen: non tender, non distended. Normal BS.  Neuro: axox 3.  Hard of hearing..  Moving all extremities Left greater than right leg pitting edema.      Latest Ref Rng & Units 10/16/2024    3:27 PM  PFT Results  FVC-Pre L 2.85   FVC-Predicted Pre % 69   FVC-Post L 2.69   FVC-Predicted Post % 66   Pre FEV1/FVC % % 56   Post FEV1/FCV % % 58   FEV1-Pre L  1.59   FEV1-Predicted Pre % 55   FEV1-Post L 1.57   DLCO uncorrected ml/min/mmHg 14.09   DLCO UNC% % 57   DLCO corrected ml/min/mmHg 14.09   DLCO COR %Predicted % 57   DLVA Predicted % 82     LABS    PULMONARY No results for input(s): PHART, PCO2ART, PO2ART, HCO3, TCO2, O2SAT in the last 168 hours.  Invalid input(s): PCO2, PO2  CBC Recent Labs  Lab 12/10/24 0204 12/11/24 0447 12/12/24 0323  HGB 13.6 13.6 13.2  HCT 39.4 40.1 39.0  WBC 19.0* 18.7* 16.0*  PLT 257 258 241    COAGULATION No results for input(s): INR in the  last 168 hours.  CARDIAC  No results for input(s): TROPONINI in the last 168 hours. Recent Labs  Lab 12/12/24 0323  PROBNP 101.0     CHEMISTRY Recent Labs  Lab 12/09/24 0550 12/10/24 0204 12/11/24 0447 12/12/24 0323 12/13/24 0334  NA 134* 134* 134* 133* 132*  K 4.2 4.6 4.9 5.5* 5.0  CL 98 98 98 98 97*  CO2 26 28 29 29 29   GLUCOSE 160* 159* 179* 214* 227*  BUN 24* 30* 31* 33* 31*  CREATININE 0.75 1.07 0.93 0.97 1.01  CALCIUM  8.5* 8.4* 8.5* 8.5* 8.5*  MG 2.3 2.5* 2.8* 2.8* 2.9*   Estimated Creatinine Clearance: 75 mL/min (by C-G formula based on SCr of 1.01 mg/dL).   LIVER Recent Labs  Lab 12/09/24 0550 12/10/24 0204 12/11/24 0447 12/12/24 0323 12/13/24 0334  AST 22 22 15  14* 11*  ALT 84* 82* 68* 58* 51*  ALKPHOS 83 72 73 67 67  BILITOT 0.4 0.5 0.4 0.4 0.5  PROT 5.8* 5.9* 5.6* 5.3* 5.4*  ALBUMIN  2.9* 3.0* 3.0* 2.8* 3.1*     INFECTIOUS Recent Labs  Lab 12/06/24 1959 12/08/24 0533 12/12/24 0323  LATICACIDVEN 4.0* 1.6  --   PROCALCITON  --   --  <0.10     ENDOCRINE CBG (last 3)  Recent Labs    12/12/24 0818 12/12/24 1229 12/13/24 0811  GLUCAP 141* 212* 165*   Imaging: - Chest x-ray with bibasilar infiltrates. CT chest June 2026 done during previous admission: Showed emphysematous changes without any PE. PFT 2025 moderate obstruction with moderate reduction in DLCO, no BD response Echo moderate LVH, EF normal, bubble study negative.  Significant labs during this hospitalization: - Procalcitonin negative, proBNP normal,    Resolved problem list   Assessment and Plan  BAseline COPD Gold stge 2: No home oxygen use or recent smoking Acute exacerbation of COPD  Hypoxemic respiratory failure: ?  Bibasilar pneumonia:  Likely multifactorial cause for severe hypoxemia which is slowly improving.  Based on previous experience on the patient the patient did have resolution of hypoxemia after treatment for COPD.  - Continue current treatment  with DuoNebs scheduled and as needed, budesonide  nebs scheduled. -Continue hypertonic nebs. -Continue antibiotics to cover for pneumonia - Reduced Solu-Medrol  to once daily. - Continue hold off on doing CT chest with PE protocol for now.  Oxygenation is slowly improving.  If by tomorrow he continues to be on high oxygen need will get CT PE. -Goal SpO2 88-92%. - Lasix  stopped.  Pulmonary will continue to follow.   Total care time: 35 minutes   Care time was exclusive of separately billable procedures and treating other patients.  Care was necessary to treat or prevent imminent or life-threatening deterioration.   Care was time spent personally by me on the following activities: development of treatment plan  with patient and/or surrogate as well as nursing, discussions with consultants, evaluation of patient's response to treatment, examination of patient, obtaining history from patient or surrogate, ordering and performing treatments and interventions, ordering and review of laboratory studies, ordering and review of radiographic studies and pulse oximetry.   Brandon JONETTA Fredericks, MD Pulmonary, Critical Care and Sleep Attending.   12/13/2024, 1:24 PM  "

## 2024-12-13 NOTE — Progress Notes (Signed)
 [facility] Progress Note Patient: Brandon [name] FMW:[id number] DOB: [birth year]  DOA: 12/06/2024 DOS: the patient was seen and examined on 12/13/2024  Brief Summary: Patient with PMH of COPD, HTN, HLD, AAA, depression, obesity presents to the hospital with complaints of progressively worsening cough and shortness of breath as well as confusion.  Significant events: 1/7 - 1/14 admitted for acute hypoxic respiratory failure secondary to COPD exacerbation.  Was able to be weaned down to room air at the time of the discharge. 1/22 presented due to worsening shortness of breath especially when lying down.  Admitted to the hospitalist service.  PCCM was consulted.  Lactic acid elevated.  Being treated for acute hypoxic respiratory failure secondary to COPD and pneumonia. 1/23 echocardiogram EF 55 to 60%, no WMA, no valvular abnormality, RV normal. 1/24 echo bubble study negative for any shunt. 1/25 switch to oral antibiotics  Consults: PCCM   Assessment and plan:  Acute hypoxic respiratory failure. Secondary to COPD exacerbation as well as acute on chronic diastolic CHF. Community-acquired pneumonia. Patient was able to be weaned down to room air during his last admission when he was presented to the hospital with COPD exacerbation. Currently on heated high flow oxygen Being treated with empiric antibiotic finish course Blood cultures negative.  MRSA PCR negative. Procalcitonin is negative. Respiratory pathogen panel negative. On IV Solu-Medrol  along with supplemental oxygen and nebulizer treatments, PCCM following.   Echocardiogram stable with EF 60%, no wall motion abnormality, RV pressures normal, no shunt on bubble study. Encouraged sitting in chair using I-S flutter valve for pulmonary toiletry, sat in chair for 6 hours on 12/12/2024, moderate improvement, oxygen demand has come down.  Encouraged to continue doing it.  SpO2: 92 % O2 Flow Rate (L/min): 30 L/min FiO2 (%): 65  %   Acute on chronic HFpEF.  EF 55 to 60% Has been appropriately diuresed no crackles currently monitor and address daily.  Oropharyngeal candidiasis. On Diflucan . Most likely due to his recent use of steroid. Most likely will worsen with ongoing use of antibiotics and steroids.  History of SDH as well as ICH. No focal deficit. Intermittent confusion noted by family in the setting of hypoxia.  HTN. On amlodipine . Given significant lower extremity edema would recommend to discontinue this medication upon discharge.  History of CAD. PAD AAA with repair. Currently no chest pain. Echocardiogram no wall motion abnormality. EKG nonischemic. Continue aspirin   Leukocytosis. This is mostly steroid-induced. For now we will monitor.  Lactic acidosis. Likely due to increased work of breathing.  Resolved.  LFT elevation. AST ALT ratio 1 is to 2.  AST normalized.  Hard of hearing. Noted.  Obesity Class 2 Body mass index is 36.77 kg/m.  Placing the pt at higher risk of poor outcomes.   Code Status: Full Code    DVT Prophylaxis: heparin  injection 5,000 Units Start: 12/06/24 1400 Place TED hose Start: 12/06/24 0813 SCDs Start: 12/06/24 0646     Physical exam. Vitals:   12/13/24 0410 12/13/24 0500 12/13/24 0740 12/13/24 0810  BP:      Pulse:      Resp:      Temp: 98.5 F (36.9 C)   98.2 F (36.8 C)  TempSrc: Oral   Oral  SpO2:   94% 92%  Weight:  118.2 kg    Height:       Awake Alert, No new F.N deficits, Normal affect [place].AT,PERRAL Supple Neck, No JVD,   Symmetrical Chest wall movement, moderate air movement bilaterally, no  significant wheezing RRR,No Gallops, Rubs or new Murmurs,  +ve B.Sounds, Abd Soft, No tenderness,   No Cyanosis, Clubbing or edema    Subjective: Patient in bed, appears comfortable, denies any headache, no fever, no chest pain or pressure, improved shortness of breath , no abdominal pain. No new focal weakness.   Family Communication:  Discussed with patient's sister at bedside on 12/13/2024  Disposition Plan: Status is: Inpatient Remains inpatient appropriate because: Monitor for improvement in respiratory status.  Still on heated high flow.   Planned Discharge Destination: TBD.  SNF recommended by PT. Diet: Diet Order             Diet Heart Fluid consistency: Thin  Diet effective now                    Data Review:   Patient Lines/Drains/Airways Status     Active Line/Drains/Airways     Name Placement date Placement time Site Days   Peripheral IV 12/12/24 20 G 1.88 Right;Anterior Forearm 12/12/24  2243  Forearm  1             Inpatient Medications  Scheduled Meds:  acidophilus  1 capsule Oral TID with meals   aspirin   81 mg Oral Daily   benzonatate   100 mg Oral TID   budesonide  (PULMICORT ) nebulizer solution  0.25 mg Nebulization BID   citalopram   20 mg Oral Daily   dextromethorphan   30 mg Oral BID   fluconazole   200 mg Oral Daily   guaiFENesin   600 mg Oral BID   heparin   5,000 Units Subcutaneous Q8H   ipratropium-albuterol   3 mL Nebulization Q6H   methylPREDNISolone  (SOLU-MEDROL ) injection  40 mg Intravenous Q12H   pantoprazole   40 mg Oral Daily   senna-docusate  2 tablet Oral BID   sodium chloride  flush  3 mL Intravenous Q12H   sodium chloride  HYPERTONIC  4 mL Nebulization Daily   sodium zirconium cyclosilicate   10 g Oral BID   Continuous Infusions: PRN Meds:.acetaminophen  **OR** acetaminophen , albuterol , bisacodyl , ondansetron  **OR** ondansetron  (ZOFRAN ) IV, oxyCODONE , traZODone   DVT Prophylaxis  heparin  injection 5,000 Units Start: 12/06/24 1400 Place TED hose Start: 12/06/24 0813 SCDs Start: 12/06/24 0646       Recent Labs  Lab 12/08/24 0533 12/09/24 0550 12/10/24 0204 12/11/24 0447 12/12/24 0323  WBC 18.9* 18.6* 19.0* 18.7* 16.0*  HGB 14.0 13.6 13.6 13.6 13.2  HCT 40.9 38.5* 39.4 40.1 39.0  PLT 233 258 257 258 241  MCV 97.8 94.1 95.6 97.1 97.3  MCH 33.5 33.3  33.0 32.9 32.9  MCHC 34.2 35.3 34.5 33.9 33.8  RDW 12.7 12.6 12.7 12.6 12.6    Recent Labs  Lab 12/06/24 1959 12/07/24 0532 12/08/24 0533 12/09/24 0550 12/10/24 0204 12/11/24 0447 12/12/24 0323 12/13/24 0334  NA  --    < > 134* 134* 134* 134* 133* 132*  K  --    < > 4.7 4.2 4.6 4.9 5.5* 5.0  CL  --    < > 100 98 98 98 98 97*  CO2  --    < > 25 26 28 29 29 29   ANIONGAP  --    < > 9 10 9 8 7 6   GLUCOSE  --    < > 127* 160* 159* 179* 214* 227*  BUN  --    < > 23 24* 30* 31* 33* 31*  CREATININE  --    < > 0.77 0.75 1.07 0.93 0.97 1.01  AST  --    < >  48* 22 22 15  14* 11*  ALT  --    < > 102* 84* 82* 68* 58* 51*  ALKPHOS  --    < > 85 83 72 73 67 67  BILITOT  --    < > 0.5 0.4 0.5 0.4 0.4 0.5  ALBUMIN   --    < > 2.9* 2.9* 3.0* 3.0* 2.8* 3.1*  CRP  --   --   --   --   --   --  <0.5  --   PROCALCITON  --   --   --   --   --   --  <0.10  --   LATICACIDVEN 4.0*  --  1.6  --   --   --   --   --   MG  --   --   --  2.3 2.5* 2.8* 2.8* 2.9*  CALCIUM   --    < > 8.5* 8.5* 8.4* 8.5* 8.5* 8.5*   < > = values in this interval not displayed.      Recent Labs  Lab 12/06/24 1959 12/07/24 0532 12/08/24 0533 12/09/24 0550 12/10/24 9795 12/11/24 0447 12/12/24 0323 12/13/24 0334  CRP  --   --   --   --   --   --  <0.5  --   PROCALCITON  --   --   --   --   --   --  <0.10  --   LATICACIDVEN 4.0*  --  1.6  --   --   --   --   --   MG  --   --   --  2.3 2.5* 2.8* 2.8* 2.9*  CALCIUM   --    < > 8.5* 8.5* 8.4* 8.5* 8.5* 8.5*   < > = values in this interval not displayed.    --------------------------------------------------------------------------------------------------------------- Lab Results  Component Value Date   CHOL 141 02/03/2023   HDL 40 02/03/2023   LDLCALC 79 02/03/2023   TRIG 123 02/03/2023   CHOLHDL 4.7 12/05/2015    No results found for: HGBA1C No results for input(s): TSH, T4TOTAL, FREET4, T3FREE, THYROIDAB in the last 72 hours. No results for input(s):  VITAMINB12, FOLATE, FERRITIN, TIBC, IRON, RETICCTPCT in the last 72 hours. ------------------------------------------------------------------------------------------------------------------ Cardiac Enzymes No results for input(s): CKMB, TROPONINI, MYOGLOBIN in the last 168 hours.  Invalid input(s): CK  Micro Results Recent Results (from the past 240 hours)  Culture, blood (Routine X 2) w Reflex to ID Panel     Status: None   Collection Time: 12/06/24  5:48 AM   Specimen: BLOOD  Result Value Ref Range Status   Specimen Description BLOOD LEFT ANTECUBITAL  Final   Special Requests   Final    BOTTLES DRAWN AEROBIC AND ANAEROBIC Blood Culture results may not be optimal due to an inadequate volume of blood received in culture bottles   Culture   Final    NO GROWTH 5 DAYS Performed at [facility], [address], [place], [place] [address]    Report Status 12/11/2024 FINAL  Final  Culture, blood (Routine X 2) w Reflex to ID Panel     Status: None   Collection Time: 12/06/24  5:53 AM   Specimen: BLOOD  Result Value Ref Range Status   Specimen Description BLOOD RIGHT ANTECUBITAL  Final   Special Requests   Final    BOTTLES DRAWN AEROBIC AND ANAEROBIC Blood Culture adequate volume   Culture   Final  NO GROWTH 5 DAYS Performed at [facility], [address] [place], [place] [address]    Report Status 12/11/2024 FINAL  Final  Respiratory (~20 pathogens) panel by PCR     Status: None   Collection Time: 12/06/24 10:10 AM   Specimen: Nasal Mucosa; Respiratory  Result Value Ref Range Status   Adenovirus NOT DETECTED NOT DETECTED Final   Coronavirus 229E NOT DETECTED NOT DETECTED Final    Comment: (NOTE) The Coronavirus on the Respiratory Panel, DOES NOT test for the novel  Coronavirus (2019 nCoV)    Coronavirus HKU1 NOT DETECTED NOT DETECTED Final   Coronavirus NL63 NOT DETECTED NOT DETECTED Final   Coronavirus OC43 NOT DETECTED NOT DETECTED Final    Metapneumovirus NOT DETECTED NOT DETECTED Final   Rhinovirus / Enterovirus NOT DETECTED NOT DETECTED Final   Influenza A NOT DETECTED NOT DETECTED Final   Influenza B NOT DETECTED NOT DETECTED Final   Parainfluenza Virus 1 NOT DETECTED NOT DETECTED Final   Parainfluenza Virus 2 NOT DETECTED NOT DETECTED Final   Parainfluenza Virus 3 NOT DETECTED NOT DETECTED Final   Parainfluenza Virus 4 NOT DETECTED NOT DETECTED Final   Respiratory Syncytial Virus NOT DETECTED NOT DETECTED Final   Bordetella pertussis NOT DETECTED NOT DETECTED Final   Bordetella Parapertussis NOT DETECTED NOT DETECTED Final   Chlamydophila pneumoniae NOT DETECTED NOT DETECTED Final   Mycoplasma pneumoniae NOT DETECTED NOT DETECTED Final    Comment: Performed at [facility], [address], [place], [place] [address]  MRSA Next Gen by PCR, Nasal     Status: None   Collection Time: 12/06/24 10:10 AM   Specimen: Nasal Mucosa; Nasal Swab  Result Value Ref Range Status   MRSA by PCR Next Gen NOT DETECTED NOT DETECTED Final    Comment: (NOTE) The GeneXpert MRSA Assay (FDA approved for NASAL specimens only), is one component of a comprehensive MRSA colonization surveillance program. It is not intended to diagnose MRSA infection nor to guide or monitor treatment for MRSA infections. Test performance is not FDA approved in patients less than 12 years old. Performed at [facility], [address], [place], [place] [address]   Expectorated Sputum Assessment w Gram Stain, Rflx to Resp Cult     Status: None   Collection Time: 12/09/24  5:50 AM   Specimen: Expectorated Sputum  Result Value Ref Range Status   Specimen Description EXPECTORATED SPUTUM  Final   Special Requests NONE  Final   Sputum evaluation   Final    THIS SPECIMEN IS ACCEPTABLE FOR SPUTUM CULTURE Performed at [facility], [address], [place], [place] [address]    Report Status 12/09/2024 FINAL  Final  Culture, Respiratory w Gram  Stain     Status: None   Collection Time: 12/09/24  5:50 AM  Result Value Ref Range Status   Specimen Description EXPECTORATED SPUTUM  Final   Special Requests NONE Reflexed from [id number]  Final   Gram Stain   Final    FEW WBC PRESENT, PREDOMINANTLY PMN FEW GRAM POSITIVE COCCI RARE BUDDING YEAST SEEN    Culture   Final    FEW Normal respiratory flora-no Staph aureus or Pseudomonas seen Performed at [facility], [address], [place], [place] [address]    Report Status 12/11/2024 FINAL  Final  MRSA Next Gen by PCR, Nasal     Status: None   Collection Time: 12/12/24  5:59 AM  Specimen: Nasal Mucosa; Nasal Swab  Result Value Ref Range Status   MRSA by PCR Next Gen NOT DETECTED NOT DETECTED Final    Comment: (NOTE) The GeneXpert MRSA Assay (FDA approved for NASAL specimens only), is one component of a comprehensive MRSA colonization surveillance program. It is not intended to diagnose MRSA infection nor to guide or monitor treatment for MRSA infections. Test performance is not FDA approved in patients less than 23 years old. Performed at [facility], [address] [place], [place] [address]     Radiology Reports  DG Chest [place] 1 View Result Date: 12/12/2024 EXAM: 1 VIEW(S) XRAY OF THE CHEST 12/12/2024 06:46:23 AM COMPARISON: 12/09/2024 CLINICAL HISTORY: Shortness of breath. FINDINGS: LUNGS AND PLEURA: Decreased pleural effusions. Improved aeration of lung bases with persistent retrocardiac opacities. No pneumothorax. HEART AND MEDIASTINUM: Cardiomegaly. No acute abnormality of the mediastinal silhouette. BONES AND SOFT TISSUES: No acute osseous abnormality. IMPRESSION: 1. Improved aeration of the lung bases with persistent retrocardiac opacities. 2. Decreased pleural effusions. 3. Cardiomegaly. Electronically signed by: Evalene Coho MD 12/12/2024 07:07 AM EST RP Workstation: [id number]      Signature  -   Lavada Stank M.D on 12/13/2024 at 8:24 AM   -  To page go  to [email or web address]

## 2024-12-13 NOTE — Progress Notes (Signed)
 Physical Therapy Treatment Patient Details Name: Brandon [name] MRN: [id number] DOB: 17-Mar-1941 Today's Date: 12/13/2024   History of Present Illness 84 y.o male presents to [facility] on 1/22 with sudden onset SOB. Currently on 45L HHFNC. PMH includes: HTN, HLD, AAA s/p repair 2013, COPD, and PVD.    PT Comments  Pt is gradually decreasing O2 requirements at rest and with activity. Pt tolerating longer bouts of ambulation this session but completes fewer laps due to increased fatigue and SOB with the increased distance. 2-3 minute seated rest break taken with cues for deep breathing. Pt expresses urgency for BM and is assisted to [facility]. Pt currently maxA for peri care but demonstrates good static balance with walker when performed. Pt reports increased fatigue after toileting and declines further activity at this time. Pt would benefit from continued PT services focused on bed mobility, balance, transfers, and progressing gait. <3hrs rehab recommended at this time, however, family prefers [facility] PT and pt likely to progress with improved respiratory status.     If plan is discharge home, recommend the following: A little help with walking and/or transfers;A little help with bathing/dressing/bathroom;Assistance with cooking/housework;Assist for transportation;Help with stairs or ramp for entrance;Direct supervision/assist for medications management   Can travel by private vehicle     No (Oxygen requirements)  Equipment Recommendations  Rolling walker (2 wheels);BSC/3in1    Recommendations for Other Services       Precautions / Restrictions Precautions Precautions: Fall Recall of Precautions/Restrictions: Intact Precaution/Restrictions Comments: Monitor O2, on 30L HHFNC Restrictions Weight Bearing Restrictions Per Provider Order: No     Mobility  Bed Mobility               General bed mobility comments: Pt seated in recliner upon arrival.    Transfers Overall transfer level: Needs  assistance Equipment used: Rolling walker (2 wheels) Transfers: Sit to/from Stand, Bed to chair/wheelchair/BSC Sit to Stand: Contact guard assist   Step pivot transfers: Contact guard assist       General transfer comment: Pt demonstrates improved carryover of hand placement for STS, no additional cues needed this session. Light assist for balance when transferring to [facility].    Ambulation/Gait Ambulation/Gait assistance: Contact guard assist Gait Distance (Feet): 10 Feet (10' fwd/bkwd x3) Assistive device: Rolling walker (2 wheels) Gait Pattern/deviations: Step-through pattern, Decreased stride length, Trunk flexed, Narrow base of support   Gait velocity interpretation: <1.31 ft/sec, indicative of household ambulator   General Gait Details: Pt tolerating longer distances of ambulation with decreased repetition due to fatigue and SOB. O2 87-90% throughout ambulation. Seated rest break 2-3 minutes in duration with cues for deep breathing. Mobility limited by urgency to have BM.   Stairs             Wheelchair Mobility     Tilt Bed    Modified Rankin (Stroke Patients Only)       Balance Overall balance assessment: Needs assistance Sitting-balance support: No upper extremity supported, Feet supported Sitting balance-Leahy Scale: Good Sitting balance - Comments: No sitting balance concerns. Sitting forward with trunk unsupported in recliner.   Standing balance support: Bilateral upper extremity supported, During functional activity, Reliant on assistive device for balance Standing balance-Leahy Scale: Poor Standing balance comment: Pt reliant on walker andCGA to maintain balance in standing. No overt LOB occurs during session. Improvements in balance with multidirectional stepping.  Communication Communication Communication: Impaired Factors Affecting Communication: Hearing impaired  Cognition Arousal: Alert Behavior During  Therapy: WFL for tasks assessed/performed   PT - Cognitive impairments: No apparent impairments                         Following commands: Intact      Cueing Cueing Techniques: Verbal cues, Tactile cues, Visual cues  Exercises      General Comments General comments (skin integrity, edema, etc.): SpO2 87-92% throughout. No new skin abnormalities noted.      Pertinent Vitals/Pain Pain Assessment Pain Assessment: No/denies pain    Home Living                          Prior Function            PT Goals (current goals can now be found in the care plan section) Acute Rehab PT Goals Patient Stated Goal: Get well, improve strength and endurance again PT Goal Formulation: With patient/family Time For Goal Achievement: 12/21/24 Potential to Achieve Goals: Good Progress towards PT goals: Progressing toward goals    Frequency    Min 2X/week      PT Plan      Co-evaluation              AM-PAC PT 6 Clicks Mobility   Outcome Measure  Help needed turning from your back to your side while in a flat bed without using bedrails?: A Little Help needed moving from lying on your back to sitting on the side of a flat bed without using bedrails?: A Little Help needed moving to and from a bed to a chair (including a wheelchair)?: A Little Help needed standing up from a chair using your arms (e.g., wheelchair or bedside chair)?: A Little Help needed to walk in hospital room?: A Little Help needed climbing 3-5 steps with a railing? : Total 6 Click Score: 16    End of Session Equipment Utilized During Treatment: Oxygen;Gait belt Activity Tolerance: Patient limited by fatigue (Pt limited by SOB) Patient left: in chair;with call bell/phone within reach;with family/visitor present Nurse Communication: Mobility status PT Visit Diagnosis: Unsteadiness on feet (R26.81);Muscle weakness (generalized) (M62.81)     Time: 8652-8579 PT Time Calculation (min)  (ACUTE ONLY): 33 min  Charges:    $Gait Training: 8-22 mins $Therapeutic Activity: 8-22 mins PT General Charges $$ ACUTE PT VISIT: 1 Visit                     Sabra Morel, PT, DPT  Acute Rehabilitation Services         Office: [phone]      Sabra MARLA Morel 12/13/2024, 4:04 PM

## 2024-12-14 ENCOUNTER — Inpatient Hospital Stay (HOSPITAL_COMMUNITY)

## 2024-12-14 DIAGNOSIS — J9601 Acute respiratory failure with hypoxia: Secondary | ICD-10-CM | POA: Diagnosis not present

## 2024-12-14 DIAGNOSIS — Z87891 Personal history of nicotine dependence: Secondary | ICD-10-CM | POA: Diagnosis not present

## 2024-12-14 DIAGNOSIS — J441 Chronic obstructive pulmonary disease with (acute) exacerbation: Secondary | ICD-10-CM | POA: Diagnosis not present

## 2024-12-14 LAB — CBC WITH DIFFERENTIAL/PLATELET
Abs Immature Granulocytes: 0.13 10*3/uL — ABNORMAL HIGH (ref 0.00–0.07)
Basophils Absolute: 0 10*3/uL (ref 0.0–0.1)
Basophils Relative: 0 %
Eosinophils Absolute: 0 10*3/uL (ref 0.0–0.5)
Eosinophils Relative: 0 %
HCT: 40.3 % (ref 39.0–52.0)
Hemoglobin: 13.9 g/dL (ref 13.0–17.0)
Immature Granulocytes: 1 %
Lymphocytes Relative: 2 %
Lymphs Abs: 0.4 10*3/uL — ABNORMAL LOW (ref 0.7–4.0)
MCH: 33.2 pg (ref 26.0–34.0)
MCHC: 34.5 g/dL (ref 30.0–36.0)
MCV: 96.2 fL (ref 80.0–100.0)
Monocytes Absolute: 0.4 10*3/uL (ref 0.1–1.0)
Monocytes Relative: 3 %
Neutro Abs: 13.6 10*3/uL — ABNORMAL HIGH (ref 1.7–7.7)
Neutrophils Relative %: 94 %
Platelets: 249 10*3/uL (ref 150–400)
RBC: 4.19 MIL/uL — ABNORMAL LOW (ref 4.22–5.81)
RDW: 12.6 % (ref 11.5–15.5)
WBC: 14.5 10*3/uL — ABNORMAL HIGH (ref 4.0–10.5)
nRBC: 0 % (ref 0.0–0.2)

## 2024-12-14 LAB — BASIC METABOLIC PANEL WITH GFR
Anion gap: 11 (ref 5–15)
BUN: 30 mg/dL — ABNORMAL HIGH (ref 8–23)
CO2: 26 mmol/L (ref 22–32)
Calcium: 7.9 mg/dL — ABNORMAL LOW (ref 8.9–10.3)
Chloride: 97 mmol/L — ABNORMAL LOW (ref 98–111)
Creatinine, Ser: 0.89 mg/dL (ref 0.61–1.24)
GFR, Estimated: >60 mL/min
Glucose, Bld: 192 mg/dL — ABNORMAL HIGH (ref 70–99)
Potassium: 4.6 mmol/L (ref 3.5–5.1)
Sodium: 134 mmol/L — ABNORMAL LOW (ref 135–145)

## 2024-12-14 LAB — C-REACTIVE PROTEIN: CRP: <0.5 mg/dL

## 2024-12-14 LAB — MAGNESIUM: Magnesium: 2.6 mg/dL — ABNORMAL HIGH (ref 1.7–2.4)

## 2024-12-14 LAB — PRO BRAIN NATRIURETIC PEPTIDE: Pro Brain Natriuretic Peptide: 144 pg/mL

## 2024-12-14 LAB — GLUCOSE, CAPILLARY: Glucose-Capillary: 248 mg/dL — ABNORMAL HIGH (ref 70–99)

## 2024-12-14 LAB — PROCALCITONIN: Procalcitonin: <0.1 ng/mL

## 2024-12-14 MED ORDER — FUROSEMIDE 10 MG/ML IJ SOLN
60.0000 mg | Freq: Once | INTRAMUSCULAR | Status: AC
  Administered 2024-12-14: 60 mg via INTRAVENOUS
  Filled 2024-12-14: qty 6

## 2024-12-14 NOTE — TOC Progression Note (Signed)
 Transition of Care [facility]) - Progression Note    Patient Details  Name: KEIDAN AUMILLER MRN: [id number] Date of Birth: 1941/03/27  Transition of Care [facility]) CM/SW Contact  [name], RN Phone Number: 12/14/2024, 12:12 PM  Clinical Narrative:    Continues on HFO2. Watch for home oxygen need.  Continued medical workup.  CM will continue to follow.    Expected Discharge Plan:  (TBD) Barriers to Discharge: Continued Medical Work up               Expected Discharge Plan and Services       Living arrangements for the past 2 months: Single Family Home                                       Social Drivers of Health (SDOH) Interventions SDOH Screenings   Food Insecurity: No Food Insecurity (12/06/2024)  Housing: Low Risk (12/06/2024)  Transportation Needs: No Transportation Needs (12/06/2024)  Utilities: Not At Risk (12/06/2024)  Depression (PHQ2-9): Low Risk (09/22/2022)  Social Connections: Moderately Isolated (12/06/2024)  Tobacco Use: Medium Risk (12/06/2024)    Readmission Risk Interventions    11/22/2024    2:11 PM  Readmission Risk Prevention Plan  Post Dischage Appt Complete  Medication Screening Complete  Transportation Screening Complete

## 2024-12-14 NOTE — Progress Notes (Signed)
 "  NAME:  Brandon [name], MRN:  [id number], DOB:  1941-06-06, LOS: 8 ADMISSION DATE:  12/06/2024, CONSULTATION DATE:  12/14/24 REFERRING MD:  Dr. Marcene, CHIEF COMPLAINT:  COPD exacerbation   History of Present Illness:   Brandon [name] is an 84 y/o M with a PMH significant for HTN, HLD, AAA w/ repair, Grade II COPD (FEV1 1.59 L, 55% predicted) who presents with shortness of breath, cough and acute hypoxic respiratory failure. Pulmonology consulted for evaluation and management of the latter.  He has a history of COPD, grade two, with multiple admissions for exacerbations. Shortness of breath started suddenly this morning around 1:30 AM. A similar episode occurred two weeks ago, but he thought he was improving until the current episode.  He denies using home oxygen and was not discharged with oxygen after previous hospitalizations. He experiences shortness of breath sometimes when lying flat and has noticed swelling in his feet and legs, which is worse than usual. No fever, chills, night sweats, or coughing up sputum or blood.  He uses an inhaler, Breztri , two puffs in the morning and two puffs in the evening, and rinses his mouth after use. He also has a nebulizer at home.  He quit smoking cigarettes 13 years ago. Pertinent  Medical History   Past Medical History:  Diagnosis Date   AAA (abdominal aortic aneurysm)    COPD (chronic obstructive pulmonary disease) (HCC)    DIC (disseminated intravascular coagulation) 09/09/2012   Hyperlipidemia    Hypertension    Leg wound, left 12/12/2013   Peripheral vascular disease    Respiratory failure, post-operative 09/09/2012   Shortness of breath    Significant Hospital Events: Including procedures, antibiotic start and stop dates in addition to other pertinent events   /7 - 1/14 admitted for acute hypoxic respiratory failure secondary to COPD exacerbation.  Was able to be weaned down to room air at the time of the discharge. 1/22 presented due  to worsening shortness of breath especially when lying down.  Admitted to the hospitalist service.  PCCM was consulted.  Lactic acid elevated. 1/23 echocardiogram EF 55 to 60%, no WMA, no valvular abnormality, RV normal. Interim History / Subjective:   Says he is doing well on 60% and 30 of flow.  No other overnight events.  Objective    Blood pressure (!) 109/58, pulse 61, temperature (!) 97.4 F (36.3 C), temperature source Oral, resp. rate (!) 24, height 5' 11 (1.803 m), weight 118.2 kg, SpO2 (!) 89%.    FiO2 (%):  [60 %-65 %] 60 %   Intake/Output Summary (Last 24 hours) at 12/14/2024 1217 Last data filed at 12/14/2024 0600 Gross per 24 hour  Intake --  Output 2500 ml  Net -2500 ml   Filed Weights   12/11/24 0500 12/12/24 0500 12/13/24 0500  Weight: 113.9 kg 116.9 kg 118.2 kg    Examination: General: Elderly male not in respiratory distress. Lungs: Clear. Heart: regular rate rhythm, no murmur appreciated.  Abdomen: non tender, non distended. Normal BS.  Neuro: axox 3.  Hard of hearing..  Moving all extremities Left greater than right leg pitting edema.      Latest Ref Rng & Units 10/16/2024    3:27 PM  PFT Results  FVC-Pre L 2.85   FVC-Predicted Pre % 69   FVC-Post L 2.69   FVC-Predicted Post % 66   Pre FEV1/FVC % % 56   Post FEV1/FCV % % 58   FEV1-Pre L 1.59   FEV1-Predicted Pre %  55   FEV1-Post L 1.57   DLCO uncorrected ml/min/mmHg 14.09   DLCO UNC% % 57   DLCO corrected ml/min/mmHg 14.09   DLCO COR %Predicted % 57   DLVA Predicted % 82     LABS    PULMONARY No results for input(s): PHART, PCO2ART, PO2ART, HCO3, TCO2, O2SAT in the last 168 hours.  Invalid input(s): PCO2, PO2  CBC Recent Labs  Lab 12/11/24 0447 12/12/24 0323 12/14/24 0613  HGB 13.6 13.2 13.9  HCT 40.1 39.0 40.3  WBC 18.7* 16.0* 14.5*  PLT 258 241 249    COAGULATION No results for input(s): INR in the last 168 hours.  CARDIAC  No results for input(s):  TROPONINI in the last 168 hours. Recent Labs  Lab 12/12/24 0323 12/14/24 0613  PROBNP 101.0 144.0     CHEMISTRY Recent Labs  Lab 12/10/24 0204 12/11/24 0447 12/12/24 0323 12/13/24 0334 12/14/24 0613  NA 134* 134* 133* 132* 134*  K 4.6 4.9 5.5* 5.0 4.6  CL 98 98 98 97* 97*  CO2 28 29 29 29 26   GLUCOSE 159* 179* 214* 227* 192*  BUN 30* 31* 33* 31* 30*  CREATININE 1.07 0.93 0.97 1.01 0.89  CALCIUM  8.4* 8.5* 8.5* 8.5* 7.9*  MG 2.5* 2.8* 2.8* 2.9* 2.6*   Estimated Creatinine Clearance: 85.2 mL/min (by C-G formula based on SCr of 0.89 mg/dL).   LIVER Recent Labs  Lab 12/09/24 0550 12/10/24 0204 12/11/24 0447 12/12/24 0323 12/13/24 0334  AST 22 22 15  14* 11*  ALT 84* 82* 68* 58* 51*  ALKPHOS 83 72 73 67 67  BILITOT 0.4 0.5 0.4 0.4 0.5  PROT 5.8* 5.9* 5.6* 5.3* 5.4*  ALBUMIN  2.9* 3.0* 3.0* 2.8* 3.1*     INFECTIOUS Recent Labs  Lab 12/08/24 0533 12/12/24 0323 12/14/24 0613  LATICACIDVEN 1.6  --   --   PROCALCITON  --  <0.10 <0.10     ENDOCRINE CBG (last 3)  Recent Labs    12/12/24 1229 12/13/24 0811 12/14/24 0900  GLUCAP 212* 165* 248*   Imaging: - Chest x-ray with bibasilar infiltrates. CT chest June 2026 done during previous admission: Showed emphysematous changes without any PE. PFT 2025 moderate obstruction with moderate reduction in DLCO, no BD response Echo moderate LVH, EF normal, bubble study negative.  Significant labs during this hospitalization: - Procalcitonin negative, proBNP normal,    Resolved problem list   Assessment and Plan  BAseline COPD Gold stge 2: No home oxygen use or recent smoking Acute exacerbation of COPD  Hypoxemic respiratory failure: ?  Bibasilar pneumonia:  Likely multifactorial cause for severe hypoxemia which is slowly improving.  Based on previous experience on the patient the patient did have resolution of hypoxemia after treatment for COPD.  - Continue DuoNebs scheduled and as needed, budesonide  nebs  scheduled.  -Continue hypertonic nebs. -Continue antibiotics to cover for pneumonia - Solu-Medrol  once daily. - Continue hold off on doing CT chest with PE protocol for now.  Oxygenation is slowly improving.  Since patient is slowly improving every day we will hold off on CT PE for now.  If not improving will get CT PE.  He has had a CT PE done during last admission in June 2026.  Then negative for PE. - Goal SpO2 88-92%. - Lasix  stopped.  Pulmonary will continue to follow.  Total care time: 40 minutes   Care time was exclusive of separately billable procedures and treating other patients.  Care was necessary to treat or prevent  imminent or life-threatening deterioration.   Care was time spent personally by me on the following activities: development of treatment plan with patient and/or surrogate as well as nursing, discussions with consultants, evaluation of patient's response to treatment, examination of patient, obtaining history from patient or surrogate, ordering and performing treatments and interventions, ordering and review of laboratory studies, ordering and review of radiographic studies and pulse oximetry.   Sammi JONETTA Fredericks, MD Pulmonary, Critical Care and Sleep Attending.   12/14/2024, 12:20 PM  "

## 2024-12-14 NOTE — Progress Notes (Signed)
 [facility] Progress Note Patient: Brandon [name] FMW:[id number] DOB: 1941/07/12  DOA: 12/06/2024 DOS: the patient was seen and examined on 12/14/2024  Brief Summary: Patient with PMH of COPD, HTN, HLD, AAA, depression, obesity presents to the hospital with complaints of progressively worsening cough and shortness of breath as well as confusion.  Significant events: 1/7 - 1/14 admitted for acute hypoxic respiratory failure secondary to COPD exacerbation.  Was able to be weaned down to room air at the time of the discharge. 1/22 presented due to worsening shortness of breath especially when lying down.  Admitted to the hospitalist service.  PCCM was consulted.  Lactic acid elevated.  Being treated for acute hypoxic respiratory failure secondary to COPD and pneumonia. 1/23 echocardiogram EF 55 to 60%, no WMA, no valvular abnormality, RV normal. 1/24 echo bubble study negative for any shunt. 1/25 switch to oral antibiotics  Consults: PCCM   Assessment and plan:  Acute hypoxic respiratory failure. Secondary to COPD exacerbation as well as acute on chronic diastolic CHF. Community-acquired pneumonia. Patient was able to be weaned down to room air during his last admission when he was presented to the hospital with COPD exacerbation. Currently on heated high flow oxygen Being treated with empiric antibiotic finish course Blood cultures negative.  MRSA PCR negative. Procalcitonin is negative. Respiratory pathogen panel negative. On IV Solu-Medrol  along with supplemental oxygen and nebulizer treatments, as needed Lasix , he does have some orthopnea, PCCM following.   Echocardiogram stable with EF 60%, no wall motion abnormality, RV pressures normal, no shunt on bubble study. Encouraged sitting in chair using I-S flutter valve for pulmonary toiletry, sat in chair for 6 hours on 12/12/2024, moderate improvement, oxygen demand has come down.  Encouraged to continue doing it.  SpO2: 91 % O2  Flow Rate (L/min): 30 L/min FiO2 (%): 60 %   Acute on chronic HFpEF.  EF 55 to 60% Being diuresed on a as needed basis, has developed some orthopnea on 12/14/2024 trial of Lasix .  Oropharyngeal candidiasis. On Diflucan . Most likely due to his recent use of steroid. Most likely will worsen with ongoing use of antibiotics and steroids.  History of SDH as well as ICH. No focal deficit. Intermittent confusion noted by family in the setting of hypoxia.  HTN. On amlodipine . Given significant lower extremity edema would recommend to discontinue this medication upon discharge.  History of CAD. PAD AAA with repair. Currently no chest pain. Echocardiogram no wall motion abnormality. EKG nonischemic. Continue aspirin   Leukocytosis. This is mostly steroid-induced. For now we will monitor.  Lactic acidosis. Likely due to increased work of breathing.  Resolved.  LFT elevation. AST ALT ratio 1 is to 2.  AST normalized.  Hard of hearing. Noted.  Obesity Class 2 Body mass index is 36.77 kg/m.  Placing the pt at higher risk of poor outcomes.  Code Status: Full Code     DVT Prophylaxis: heparin  injection 5,000 Units Start: 12/06/24 1400 Place TED hose Start: 12/06/24 0813 SCDs Start: 12/06/24 0646     Physical exam. Vitals:   12/14/24 0258 12/14/24 0400 12/14/24 0832 12/14/24 0907  BP:  130/67 102/65 (!) 122/58  Pulse:  (!) 57 69 67  Resp:  20 (!) 23 18  Temp:  (!) 97.5 F (36.4 C) (!) 96.2 F (35.7 C) 97.7 F (36.5 C)  TempSrc:  Oral Axillary Oral  SpO2: 95% 92% 93% 91%  Weight:      Height:       Awake Alert, No new  F.N deficits, Normal affect [place].AT,PERRAL Supple Neck, No JVD,   Symmetrical Chest wall movement, moderate air movement bilaterally, no significant wheezing, does have crackles on 12/14/2024 RRR,No Gallops, Rubs or new Murmurs,  +ve B.Sounds, Abd Soft, No tenderness,   No Cyanosis, Clubbing or edema    Subjective: Patient in bed, appears  comfortable, denies any headache, no fever, no chest pain or pressure, improved shortness of breath , no abdominal pain. No new focal weakness.   Family Communication: Discussed with patient's sister at bedside on 12/13/2024  Disposition Plan: Status is: Inpatient Remains inpatient appropriate because: Monitor for improvement in respiratory status.  Still on heated high flow.   Planned Discharge Destination: TBD.  SNF recommended by PT. Diet: Diet Order             Diet Heart Fluid consistency: Thin  Diet effective now                    Data Review:   Patient Lines/Drains/Airways Status     Active Line/Drains/Airways     Name Placement date Placement time Site Days   Peripheral IV 12/12/24 20 G 1.88 Right;Anterior Forearm 12/12/24  2243  Forearm  2             Inpatient Medications  Scheduled Meds:  acidophilus  1 capsule Oral TID with meals   aspirin   81 mg Oral Daily   benzonatate   100 mg Oral TID   budesonide  (PULMICORT ) nebulizer solution  0.25 mg Nebulization BID   citalopram   20 mg Oral Daily   dextromethorphan   30 mg Oral BID   fluconazole   200 mg Oral Daily   furosemide   60 mg Intravenous Once   guaiFENesin   600 mg Oral BID   heparin   5,000 Units Subcutaneous Q8H   ipratropium-albuterol   3 mL Nebulization Q6H   methylPREDNISolone  (SOLU-MEDROL ) injection  40 mg Intravenous Q12H   pantoprazole   40 mg Oral Daily   senna-docusate  2 tablet Oral BID   sodium chloride  flush  3 mL Intravenous Q12H   Continuous Infusions: PRN Meds:.acetaminophen  **OR** acetaminophen , albuterol , bisacodyl , ondansetron  **OR** ondansetron  (ZOFRAN ) IV, oxyCODONE , traZODone   DVT Prophylaxis  heparin  injection 5,000 Units Start: 12/06/24 1400 Place TED hose Start: 12/06/24 0813 SCDs Start: 12/06/24 0646     Recent Labs  Lab 12/09/24 0550 12/10/24 0204 12/11/24 0447 12/12/24 0323 12/14/24 0613  WBC 18.6* 19.0* 18.7* 16.0* 14.5*  HGB 13.6 13.6 13.6 13.2 13.9  HCT  38.5* 39.4 40.1 39.0 40.3  PLT 258 257 258 241 249  MCV 94.1 95.6 97.1 97.3 96.2  MCH 33.3 33.0 32.9 32.9 33.2  MCHC 35.3 34.5 33.9 33.8 34.5  RDW 12.6 12.7 12.6 12.6 12.6  LYMPHSABS  --   --   --   --  0.4*  MONOABS  --   --   --   --  0.4  EOSABS  --   --   --   --  0.0  BASOSABS  --   --   --   --  0.0    Recent Labs  Lab 12/08/24 0533 12/08/24 0533 12/09/24 0550 12/10/24 0204 12/11/24 0447 12/12/24 0323 12/13/24 0334 12/14/24 0613  NA 134*  --  134* 134* 134* 133* 132* 134*  K 4.7  --  4.2 4.6 4.9 5.5* 5.0 4.6  CL 100  --  98 98 98 98 97* 97*  CO2 25  --  26 28 29 29 29 26   ANIONGAP 9  --  10 9 8 7 6 11   GLUCOSE 127*  --  160* 159* 179* 214* 227* 192*  BUN 23  --  24* 30* 31* 33* 31* 30*  CREATININE 0.77  --  0.75 1.07 0.93 0.97 1.01 0.89  AST 48*  --  22 22 15  14* 11*  --   ALT 102*  --  84* 82* 68* 58* 51*  --   ALKPHOS 85  --  83 72 73 67 67  --   BILITOT 0.5  --  0.4 0.5 0.4 0.4 0.5  --   ALBUMIN  2.9*  --  2.9* 3.0* 3.0* 2.8* 3.1*  --   CRP  --   --   --   --   --  <0.5  --  <0.5  PROCALCITON  --   --   --   --   --  <0.10  --  <0.10  LATICACIDVEN 1.6  --   --   --   --   --   --   --   MG  --    < > 2.3 2.5* 2.8* 2.8* 2.9* 2.6*  CALCIUM  8.5*  --  8.5* 8.4* 8.5* 8.5* 8.5* 7.9*   < > = values in this interval not displayed.      Recent Labs  Lab 12/08/24 0533 12/09/24 0550 12/10/24 0204 12/11/24 0447 12/12/24 0323 12/13/24 0334 12/14/24 0613  CRP  --   --   --   --  <0.5  --  <0.5  PROCALCITON  --   --   --   --  <0.10  --  <0.10  LATICACIDVEN 1.6  --   --   --   --   --   --   MG  --    < > 2.5* 2.8* 2.8* 2.9* 2.6*  CALCIUM  8.5*   < > 8.4* 8.5* 8.5* 8.5* 7.9*   < > = values in this interval not displayed.    --------------------------------------------------------------------------------------------------------------- Lab Results  Component Value Date   CHOL 141 02/03/2023   HDL 40 02/03/2023   LDLCALC 79 02/03/2023   TRIG 123 02/03/2023    CHOLHDL 4.7 12/05/2015    No results found for: HGBA1C No results for input(s): TSH, T4TOTAL, FREET4, T3FREE, THYROIDAB in the last 72 hours. No results for input(s): VITAMINB12, FOLATE, FERRITIN, TIBC, IRON, RETICCTPCT in the last 72 hours. ------------------------------------------------------------------------------------------------------------------ Cardiac Enzymes No results for input(s): CKMB, TROPONINI, MYOGLOBIN in the last 168 hours.  Invalid input(s): CK  Micro Results Recent Results (from the past 240 hours)  Culture, blood (Routine X 2) w Reflex to ID Panel     Status: None   Collection Time: 12/06/24  5:48 AM   Specimen: BLOOD  Result Value Ref Range Status   Specimen Description BLOOD LEFT ANTECUBITAL  Final   Special Requests   Final    BOTTLES DRAWN AEROBIC AND ANAEROBIC Blood Culture results may not be optimal due to an inadequate volume of blood received in culture bottles   Culture   Final    NO GROWTH 5 DAYS Performed at [facility], [address] [place], [place] [address]    Report Status 12/11/2024 FINAL  Final  Culture, blood (Routine X 2) w Reflex to ID Panel     Status: None   Collection Time: 12/06/24  5:53 AM   Specimen: BLOOD  Result Value Ref Range Status   Specimen Description BLOOD RIGHT ANTECUBITAL  Final   Special Requests  Final    BOTTLES DRAWN AEROBIC AND ANAEROBIC Blood Culture adequate volume   Culture   Final    NO GROWTH 5 DAYS Performed at [facility], [address], [place], [place] [address]    Report Status 12/11/2024 FINAL  Final  Respiratory (~20 pathogens) panel by PCR     Status: None   Collection Time: 12/06/24 10:10 AM   Specimen: Nasal Mucosa; Respiratory  Result Value Ref Range Status   Adenovirus NOT DETECTED NOT DETECTED Final   Coronavirus 229E NOT DETECTED NOT DETECTED Final    Comment: (NOTE) The Coronavirus on the Respiratory Panel, DOES NOT test for the novel   Coronavirus (2019 nCoV)    Coronavirus HKU1 NOT DETECTED NOT DETECTED Final   Coronavirus NL63 NOT DETECTED NOT DETECTED Final   Coronavirus OC43 NOT DETECTED NOT DETECTED Final   Metapneumovirus NOT DETECTED NOT DETECTED Final   Rhinovirus / Enterovirus NOT DETECTED NOT DETECTED Final   Influenza A NOT DETECTED NOT DETECTED Final   Influenza B NOT DETECTED NOT DETECTED Final   Parainfluenza Virus 1 NOT DETECTED NOT DETECTED Final   Parainfluenza Virus 2 NOT DETECTED NOT DETECTED Final   Parainfluenza Virus 3 NOT DETECTED NOT DETECTED Final   Parainfluenza Virus 4 NOT DETECTED NOT DETECTED Final   Respiratory Syncytial Virus NOT DETECTED NOT DETECTED Final   Bordetella pertussis NOT DETECTED NOT DETECTED Final   Bordetella Parapertussis NOT DETECTED NOT DETECTED Final   Chlamydophila pneumoniae NOT DETECTED NOT DETECTED Final   Mycoplasma pneumoniae NOT DETECTED NOT DETECTED Final    Comment: Performed at [facility], [address], [place], [place] [address]  MRSA Next Gen by PCR, Nasal     Status: None   Collection Time: 12/06/24 10:10 AM   Specimen: Nasal Mucosa; Nasal Swab  Result Value Ref Range Status   MRSA by PCR Next Gen NOT DETECTED NOT DETECTED Final    Comment: (NOTE) The GeneXpert MRSA Assay (FDA approved for NASAL specimens only), is one component of a comprehensive MRSA colonization surveillance program. It is not intended to diagnose MRSA infection nor to guide or monitor treatment for MRSA infections. Test performance is not FDA approved in patients less than 52 years old. Performed at [facility], [address], [place], [place] [address]   Expectorated Sputum Assessment w Gram Stain, Rflx to Resp Cult     Status: None   Collection Time: 12/09/24  5:50 AM   Specimen: Expectorated Sputum  Result Value Ref Range Status   Specimen Description EXPECTORATED SPUTUM  Final   Special Requests NONE  Final   Sputum evaluation   Final    THIS SPECIMEN  IS ACCEPTABLE FOR SPUTUM CULTURE Performed at [facility], [address] [place], [place] [address]    Report Status 12/09/2024 FINAL  Final  Culture, Respiratory w Gram Stain     Status: None   Collection Time: 12/09/24  5:50 AM  Result Value Ref Range Status   Specimen Description EXPECTORATED SPUTUM  Final   Special Requests NONE Reflexed from [id number]  Final   Gram Stain   Final    FEW WBC PRESENT, PREDOMINANTLY PMN FEW GRAM POSITIVE COCCI RARE BUDDING YEAST SEEN    Culture   Final    FEW Normal respiratory flora-no Staph aureus or Pseudomonas seen Performed at [facility], [address] [place], [place] [address]    Report Status 12/11/2024 FINAL  Final  MRSA Next Gen by PCR, Nasal     Status: None   Collection Time: 12/12/24  5:59 AM   Specimen: Nasal Mucosa; Nasal Swab  Result Value Ref Range Status   MRSA by PCR Next Gen NOT DETECTED NOT DETECTED Final    Comment: (NOTE) The GeneXpert MRSA Assay (FDA approved for NASAL specimens only), is one component of a comprehensive MRSA colonization surveillance program. It is not intended to diagnose MRSA infection nor to guide or monitor treatment for MRSA infections. Test performance is not FDA approved in patients less than 23 years old. Performed at [facility], [address] [place], [place] [address]     Radiology Reports  DG Chest Port 1 View Result Date: 12/14/2024 EXAM: 1 VIEW XRAY OF THE CHEST 12/14/2024 06:55:00 AM COMPARISON: Portable chest 12/12/2024. CLINICAL HISTORY: Shortness of breath. FINDINGS: LUNGS AND PLEURA: Small pleural effusions are again noted. Bibasilar atelectasis or consolidation. Above the lung bases, the remaining lungs are clear. Overall aeration seems unchanged. No pneumothorax. HEART AND MEDIASTINUM: Stable cardiomegaly. Stable mediastinal widening and aortic tortuosity and atherosclerosis. No vascular congestion is seen. BONES AND SOFT TISSUES: No acute osseous  abnormality. IMPRESSION: 1. Small pleural effusions and bibasilar atelectasis or consolidation, unchanged. 2. Stable cardiomegaly. No vascular congestion. Electronically signed by: Francis Quam MD 12/14/2024 07:27 AM EST RP Workstation: [id number]      Signature  -   Lavada Stank M.D on 12/14/2024 at 10:27 AM   -  To page go to [email or web address]

## 2024-12-15 DIAGNOSIS — J9601 Acute respiratory failure with hypoxia: Secondary | ICD-10-CM | POA: Diagnosis not present

## 2024-12-15 LAB — CBC WITH DIFFERENTIAL/PLATELET
Abs Immature Granulocytes: 0.22 10*3/uL — ABNORMAL HIGH (ref 0.00–0.07)
Basophils Absolute: 0 10*3/uL (ref 0.0–0.1)
Basophils Relative: 0 %
Eosinophils Absolute: 0 10*3/uL (ref 0.0–0.5)
Eosinophils Relative: 0 %
HCT: 39.8 % (ref 39.0–52.0)
Hemoglobin: 13.5 g/dL (ref 13.0–17.0)
Immature Granulocytes: 2 %
Lymphocytes Relative: 3 %
Lymphs Abs: 0.4 10*3/uL — ABNORMAL LOW (ref 0.7–4.0)
MCH: 32.8 pg (ref 26.0–34.0)
MCHC: 33.9 g/dL (ref 30.0–36.0)
MCV: 96.6 fL (ref 80.0–100.0)
Monocytes Absolute: 0.4 10*3/uL (ref 0.1–1.0)
Monocytes Relative: 3 %
Neutro Abs: 14.1 10*3/uL — ABNORMAL HIGH (ref 1.7–7.7)
Neutrophils Relative %: 92 %
Platelets: 251 10*3/uL (ref 150–400)
RBC: 4.12 MIL/uL — ABNORMAL LOW (ref 4.22–5.81)
RDW: 12.7 % (ref 11.5–15.5)
WBC: 15 10*3/uL — ABNORMAL HIGH (ref 4.0–10.5)
nRBC: 0 % (ref 0.0–0.2)

## 2024-12-15 LAB — BASIC METABOLIC PANEL WITH GFR
Anion gap: 8 (ref 5–15)
BUN: 33 mg/dL — ABNORMAL HIGH (ref 8–23)
CO2: 30 mmol/L (ref 22–32)
Calcium: 8.4 mg/dL — ABNORMAL LOW (ref 8.9–10.3)
Chloride: 94 mmol/L — ABNORMAL LOW (ref 98–111)
Creatinine, Ser: 0.97 mg/dL (ref 0.61–1.24)
GFR, Estimated: >60 mL/min
Glucose, Bld: 244 mg/dL — ABNORMAL HIGH (ref 70–99)
Potassium: 4.9 mmol/L (ref 3.5–5.1)
Sodium: 133 mmol/L — ABNORMAL LOW (ref 135–145)

## 2024-12-15 LAB — PHOSPHORUS: Phosphorus: 3.5 mg/dL (ref 2.5–4.6)

## 2024-12-15 LAB — GLUCOSE, CAPILLARY: Glucose-Capillary: 195 mg/dL — ABNORMAL HIGH (ref 70–99)

## 2024-12-15 LAB — MAGNESIUM: Magnesium: 2.5 mg/dL — ABNORMAL HIGH (ref 1.7–2.4)

## 2024-12-15 MED ORDER — REVEFENACIN 175 MCG/3ML IN SOLN
175.0000 ug | Freq: Every day | RESPIRATORY_TRACT | Status: AC
  Administered 2024-12-15 – 2024-12-21 (×7): 175 ug via RESPIRATORY_TRACT
  Filled 2024-12-15 (×8): qty 3

## 2024-12-15 MED ORDER — ARFORMOTEROL TARTRATE 15 MCG/2ML IN NEBU
15.0000 ug | INHALATION_SOLUTION | Freq: Two times a day (BID) | RESPIRATORY_TRACT | Status: AC
  Administered 2024-12-15 – 2024-12-21 (×13): 15 ug via RESPIRATORY_TRACT
  Filled 2024-12-15 (×13): qty 2

## 2024-12-15 MED ORDER — BUDESONIDE 0.5 MG/2ML IN SUSP
0.5000 mg | Freq: Two times a day (BID) | RESPIRATORY_TRACT | Status: AC
  Administered 2024-12-15 – 2024-12-21 (×13): 0.5 mg via RESPIRATORY_TRACT
  Filled 2024-12-15 (×13): qty 2

## 2024-12-15 MED ORDER — FUROSEMIDE 10 MG/ML IJ SOLN
60.0000 mg | Freq: Once | INTRAMUSCULAR | Status: AC
  Administered 2024-12-15: 60 mg via INTRAVENOUS
  Filled 2024-12-15: qty 6

## 2024-12-15 NOTE — Progress Notes (Signed)
 Physical Therapy Treatment Patient Details Name: Brandon [name] MRN: [id number] DOB: 1941-02-05 Today's Date: 12/15/2024   History of Present Illness 84 y.o male presents to [facility] on 1/22 with sudden onset SOB. Currently on 30L HHFNC. PMH includes: HTN, HLD, AAA s/p repair 2013, COPD, and PVD.    PT Comments  Pt tolerates greater repetition of short distance laps in room with walker. Pt gradually decreasing O2 needs but remains SOB throughout activity. Multiple 3-4 minute seated rest breaks taken to manage SOB. Pt demonstrates improved carryover of hand placement for transfers and navigates tight spaces in room without assist. Standing balance activities completed in front of chair. Pt tolerates standing marches and standing SLR well with support of walker. Pt reliant on walker for balance at this time. Pt would benefit from continued PT services focused on bed mobility, balance, transfers, and progressing gait. <3hrs rehab recommended at this time, however, family prefers [facility] PT and pt likely to progress with improved respiratory status.      If plan is discharge home, recommend the following: A little help with walking and/or transfers;A little help with bathing/dressing/bathroom;Assistance with cooking/housework;Assist for transportation;Help with stairs or ramp for entrance;Direct supervision/assist for medications management   Can travel by private vehicle     No (Oxygen requirements)  Equipment Recommendations  Rolling walker (2 wheels);BSC/3in1    Recommendations for Other Services       Precautions / Restrictions Precautions Precautions: Fall Recall of Precautions/Restrictions: Intact Precaution/Restrictions Comments: Monitor O2, on 30L HHFNC Restrictions Weight Bearing Restrictions Per Provider Order: No     Mobility  Bed Mobility               General bed mobility comments: Pt seated in recliner upon arrival.    Transfers Overall transfer level: Needs  assistance Equipment used: Rolling walker (2 wheels) Transfers: Sit to/from Stand Sit to Stand: Contact guard assist           General transfer comment: Continues to demonstrate improved hand placement. Good eccentric control when returning to sit. Quickly becomes SOB with activity.    Ambulation/Gait Ambulation/Gait assistance: Contact guard assist Gait Distance (Feet): 10 Feet (10' fwd/bkwd x5 laps) Assistive device: Rolling walker (2 wheels) Gait Pattern/deviations: Step-through pattern, Decreased stride length, Trunk flexed, Narrow base of support   Gait velocity interpretation: <1.31 ft/sec, indicative of household ambulator   General Gait Details: Pt tolerating increased reps ambulating an increased distance. 4 minute seated rest break taken after 3 laps and then completed 2 additional. Manages walker well, no LOB with forward or backward stepping. Further ambulation limited by HHFNC O2 attachment to wall.   Stairs             Wheelchair Mobility     Tilt Bed    Modified Rankin (Stroke Patients Only)       Balance Overall balance assessment: Needs assistance Sitting-balance support: No upper extremity supported, Feet supported Sitting balance-Leahy Scale: Good Sitting balance - Comments: No sitting balance concerns. Sitting forward with trunk unsupported in recliner.   Standing balance support: Bilateral upper extremity supported, During functional activity, Reliant on assistive device for balance Standing balance-Leahy Scale: Poor Standing balance comment: Pt reliant on walker andCGA to maintain balance in standing. No overt LOB occurs during session. Improvements in balance with multidirectional stepping.                            Communication Communication Communication: Impaired Factors Affecting  Communication: Hearing impaired  Cognition Arousal: Alert Behavior During Therapy: WFL for tasks assessed/performed   PT - Cognitive  impairments: No apparent impairments                         Following commands: Intact      Cueing Cueing Techniques: Verbal cues, Tactile cues, Visual cues  Exercises Other Exercises Other Exercises: Standing marches 2x10 with brief standing rest break Other Exercises: SLS w/ forward and lateral SLR w/ support of walker 1x6 each leg    General Comments General comments (skin integrity, edema, etc.): VSS throughout. No new skin abnormalities noted.      Pertinent Vitals/Pain Pain Assessment Pain Assessment: No/denies pain    Home Living                          Prior Function            PT Goals (current goals can now be found in the care plan section) Acute Rehab PT Goals Patient Stated Goal: Get well, improve strength and endurance again PT Goal Formulation: With patient/family Time For Goal Achievement: 12/21/24 Potential to Achieve Goals: Good Progress towards PT goals: Progressing toward goals    Frequency    Min 2X/week      PT Plan      Co-evaluation              AM-PAC PT 6 Clicks Mobility   Outcome Measure  Help needed turning from your back to your side while in a flat bed without using bedrails?: A Little Help needed moving from lying on your back to sitting on the side of a flat bed without using bedrails?: A Little Help needed moving to and from a bed to a chair (including a wheelchair)?: A Little Help needed standing up from a chair using your arms (e.g., wheelchair or bedside chair)?: A Little Help needed to walk in hospital room?: A Little Help needed climbing 3-5 steps with a railing? : Total 6 Click Score: 16    End of Session Equipment Utilized During Treatment: Oxygen Activity Tolerance: Patient limited by fatigue (Pt limited by SOB) Patient left: in chair;with call bell/phone within reach Nurse Communication: Mobility status PT Visit Diagnosis: Unsteadiness on feet (R26.81);Muscle weakness (generalized)  (M62.81)     Time: 8644-8573 PT Time Calculation (min) (ACUTE ONLY): 31 min  Charges:    $Gait Training: 8-22 mins $Therapeutic Activity: 8-22 mins PT General Charges $$ ACUTE PT VISIT: 1 Visit                     Sabra Morel, PT, DPT  Acute Rehabilitation Services         Office: [phone]      Sabra MARLA Morel 12/15/2024, 3:57 PM

## 2024-12-15 NOTE — Progress Notes (Signed)
 "  NAME:  Brandon [name], MRN:  [id number], DOB:  1941/10/06, LOS: 9 ADMISSION DATE:  12/06/2024, CONSULTATION DATE:  12/15/24 REFERRING MD:  Dr. Marcene, CHIEF COMPLAINT:  COPD exacerbation   History of Present Illness:   Brandon [name] is an 84 y/o M with a PMH significant for HTN, HLD, AAA w/ repair, Grade II COPD (FEV1 1.59 L, 55% predicted) who presents with shortness of breath, cough and acute hypoxic respiratory failure. Pulmonology consulted for evaluation and management of the latter.  He has a history of COPD, grade two, with multiple admissions for exacerbations. Shortness of breath started suddenly this morning around 1:30 AM. A similar episode occurred two weeks ago, but he thought he was improving until the current episode.  He denies using home oxygen and was not discharged with oxygen after previous hospitalizations. He experiences shortness of breath sometimes when lying flat and has noticed swelling in his feet and legs, which is worse than usual. No fever, chills, night sweats, or coughing up sputum or blood.  He uses an inhaler, Breztri , two puffs in the morning and two puffs in the evening, and rinses his mouth after use. He also has a nebulizer at home.  He quit smoking cigarettes 13 years ago. Pertinent  Medical History   Past Medical History:  Diagnosis Date   AAA (abdominal aortic aneurysm)    COPD (chronic obstructive pulmonary disease) (HCC)    DIC (disseminated intravascular coagulation) 09/09/2012   Hyperlipidemia    Hypertension    Leg wound, left 12/12/2013   Peripheral vascular disease    Respiratory failure, post-operative 09/09/2012   Shortness of breath    Significant Hospital Events: Including procedures, antibiotic start and stop dates in addition to other pertinent events   1/7 - 1/14 admitted for acute hypoxic respiratory failure secondary to COPD exacerbation.  Was able to be weaned down to room air at the time of the discharge. 1/22 presented due  to worsening shortness of breath especially when lying down.  Admitted to the hospitalist service.  PCCM was consulted.  Lactic acid elevated. 1/23 echocardiogram EF 55 to 60%, no WMA, no valvular abnormality, RV normal. Interim History / Subjective:   States breathing better Remains on 30 L / 60% HHFNC Diuresed 1.5 L  Objective    Blood pressure 119/71, pulse 68, temperature 98 F (36.7 C), temperature source Oral, resp. rate 20, height 5' 11 (1.803 m), weight 118.2 kg, SpO2 90%.    FiO2 (%):  [60 %] 60 %   Intake/Output Summary (Last 24 hours) at 12/15/2024 1440 Last data filed at 12/15/2024 1400 Gross per 24 hour  Intake 480 ml  Output 1950 ml  Net -1470 ml   Filed Weights   12/11/24 0500 12/12/24 0500 12/13/24 0500  Weight: 113.9 kg 116.9 kg 118.2 kg    Examination: General: Elderly obese male, out of bed to chair, no distress Lungs: The breath sounds bilateral, no accessory muscle use S1-S2 regular, no murmur Abdomen: non tender, non distended. Normal BS.  Neuro: axox 3.  Hard of hearing..  Moving all extremities Left greater than right leg 1+  pitting edema.      Latest Ref Rng & Units 10/16/2024    3:27 PM  PFT Results  FVC-Pre L 2.85   FVC-Predicted Pre % 69   FVC-Post L 2.69   FVC-Predicted Post % 66   Pre FEV1/FVC % % 56   Post FEV1/FCV % % 58   FEV1-Pre L 1.59  FEV1-Predicted Pre % 55   FEV1-Post L 1.57   DLCO uncorrected ml/min/mmHg 14.09   DLCO UNC% % 57   DLCO corrected ml/min/mmHg 14.09   DLCO COR %Predicted % 57   DLVA Predicted % 82     LABS  Mild leukocytosis, negative procalcitonin, low BNP  PULMONARY No results for input(s): PHART, PCO2ART, PO2ART, HCO3, TCO2, O2SAT in the last 168 hours.  Invalid input(s): PCO2, PO2  CBC Recent Labs  Lab 12/12/24 0323 12/14/24 0613 12/15/24 0346  HGB 13.2 13.9 13.5  HCT 39.0 40.3 39.8  WBC 16.0* 14.5* 15.0*  PLT 241 249 251    COAGULATION No results for input(s): INR in  the last 168 hours.  CARDIAC  No results for input(s): TROPONINI in the last 168 hours. Recent Labs  Lab 12/12/24 0323 12/14/24 0613  PROBNP 101.0 144.0     CHEMISTRY Recent Labs  Lab 12/11/24 0447 12/12/24 0323 12/13/24 0334 12/14/24 0613 12/15/24 0346  NA 134* 133* 132* 134* 133*  K 4.9 5.5* 5.0 4.6 4.9  CL 98 98 97* 97* 94*  CO2 29 29 29 26 30   GLUCOSE 179* 214* 227* 192* 244*  BUN 31* 33* 31* 30* 33*  CREATININE 0.93 0.97 1.01 0.89 0.97  CALCIUM  8.5* 8.5* 8.5* 7.9* 8.4*  MG 2.8* 2.8* 2.9* 2.6* 2.5*  PHOS  --   --   --   --  3.5   Estimated Creatinine Clearance: 78.1 mL/min (by C-G formula based on SCr of 0.97 mg/dL).   LIVER Recent Labs  Lab 12/09/24 0550 12/10/24 0204 12/11/24 0447 12/12/24 0323 12/13/24 0334  AST 22 22 15  14* 11*  ALT 84* 82* 68* 58* 51*  ALKPHOS 83 72 73 67 67  BILITOT 0.4 0.5 0.4 0.4 0.5  PROT 5.8* 5.9* 5.6* 5.3* 5.4*  ALBUMIN  2.9* 3.0* 3.0* 2.8* 3.1*     INFECTIOUS Recent Labs  Lab 12/12/24 0323 12/14/24 0613  PROCALCITON <0.10 <0.10     ENDOCRINE CBG (last 3)  Recent Labs    12/13/24 0811 12/14/24 0900 12/15/24 0813  GLUCAP 165* 248* 195*   Imaging: - Chest x-ray with bibasilar infiltrates. CT chest June 2026 done during previous admission: Showed emphysematous changes without any PE. PFT 2025 moderate obstruction with moderate reduction in DLCO, no BD response Echo moderate LVH, EF normal, bubble study negative.  Significant labs during this hospitalization: - Procalcitonin negative, proBNP normal,    Resolved problem list   Assessment and Plan  BAseline COPD Gold stge 2: No home oxygen use or recent smoking Acute exacerbation of COPD  Acute hypoxemic respiratory failure: ?  Bibasilar pneumonia:  Severe hypoxemia out of proportion to pulmonary infiltrates, no evidence of severe PAH or cor pulmonale, CT angiogram chest was -1/7, bubble study negative for shunt.   based on previous experience on the  patient the patient did have resolution of hypoxemia after treatment for COPD.  -Budesonide , add Brovana  and Yupelri , DC Atrovent  -Decrease Solu-Medrol  40 daily , hopefully this will help his sugars  - Goal SpO2 88-92%, can transition to HFNC once FiO2 down to 50%, is down to 30 L - Pedal edema attributed to amlodipine , would diurese intermittently to maintain negative balance  Pulmonary will continue to follow.  Harden Staff MD. DEBE. [facility] Pager : [phone]  If no response to pager , please call [phone] until 7 pm After 7:00 pm call Elink  [phone]      12/15/2024, 2:40 PM  "

## 2024-12-15 NOTE — Progress Notes (Signed)
 [facility] Progress Note Patient: Brandon [name] FMW:[id number] DOB: 07/04/1941  DOA: 12/06/2024 DOS: the patient was seen and examined on 12/15/2024  Brief Summary: Patient with PMH of COPD, HTN, HLD, AAA, depression, obesity presents to the hospital with complaints of progressively worsening cough and shortness of breath as well as confusion.  Significant events: 1/7 - 1/14 admitted for acute hypoxic respiratory failure secondary to COPD exacerbation.  Was able to be weaned down to room air at the time of the discharge. 1/22 presented due to worsening shortness of breath especially when lying down.  Admitted to the hospitalist service.  PCCM was consulted.  Lactic acid elevated.  Being treated for acute hypoxic respiratory failure secondary to COPD and pneumonia. 1/23 echocardiogram EF 55 to 60%, no WMA, no valvular abnormality, RV normal. 1/24 echo bubble study negative for any shunt. 1/25 switch to oral antibiotics  Consults: PCCM   Assessment and plan:  Acute hypoxic respiratory failure. Secondary to COPD exacerbation as well as acute on chronic diastolic CHF. Community-acquired pneumonia. Patient was able to be weaned down to room air during his last admission when he was presented to the hospital with COPD exacerbation. Currently on heated high flow oxygen Being treated with empiric antibiotic finish course Blood cultures negative.  MRSA PCR negative. Procalcitonin is negative. Respiratory pathogen panel negative. On IV Solu-Medrol  along with supplemental oxygen and nebulizer treatments, as needed Lasix , he does have some orthopnea, PCCM following.   Echocardiogram stable with EF 60%, no wall motion abnormality, RV pressures normal, no shunt on bubble study. Encouraged sitting in chair using I-S flutter valve for pulmonary toiletry, sat in chair for 6 hours on 12/12/2024, moderate improvement, oxygen demand has come down.  Encouraged to continue doing it.  SpO2: 93 % O2  Flow Rate (L/min): 30 L/min FiO2 (%): 60 %   Acute on chronic HFpEF.  EF 55 to 60% Being diuresed on a as needed basis, has developed some orthopnea on 12/14/2024 trial of Lasix .  Oropharyngeal candidiasis. On Diflucan . Most likely due to his recent use of steroid. Most likely will worsen with ongoing use of antibiotics and steroids.  History of SDH as well as ICH. No focal deficit. Intermittent confusion noted by family in the setting of hypoxia.  HTN. On amlodipine . Given significant lower extremity edema would recommend to discontinue this medication upon discharge.  History of CAD. PAD AAA with repair. Currently no chest pain. Echocardiogram no wall motion abnormality. EKG nonischemic. Continue aspirin   Leukocytosis. This is mostly steroid-induced. For now we will monitor.  Lactic acidosis. Likely due to increased work of breathing.  Resolved.  LFT elevation. AST ALT ratio 1 is to 2.  AST normalized.  Hard of hearing. Noted.  Obesity Class 2 Body mass index is 36.77 kg/m.  Placing the pt at higher risk of poor outcomes.  Code Status: Full Code     DVT Prophylaxis: heparin  injection 5,000 Units Start: 12/06/24 1400 Place TED hose Start: 12/06/24 0813 SCDs Start: 12/06/24 0646     Physical exam. Vitals:   12/15/24 0342 12/15/24 0400 12/15/24 0738 12/15/24 0841  BP:  126/62 122/69   Pulse: (!) 59 66 64   Resp: 17 17 (!) 22   Temp:  98 F (36.7 C) 97.6 F (36.4 C)   TempSrc:  Oral Oral   SpO2: 96% 93% 90% 93%  Weight:      Height:       Awake Alert, No new F.N deficits, Normal affect [place].AT,PERRAL Supple  Neck, No JVD,   Symmetrical Chest wall movement, moderate air movement bilaterally, no significant wheezing, mild but positive crackles RRR,No Gallops, Rubs or new Murmurs,  +ve B.Sounds, Abd Soft, No tenderness,   No Cyanosis, Clubbing or edema    Subjective: Patient in bed, appears comfortable, denies any headache, no fever, no chest pain  or pressure, improved orthopnea and shortness of breath , no abdominal pain. No focal weakness.   Family Communication: Discussed with patient's sister at bedside on 12/13/2024  Disposition Plan: Status is: Inpatient Remains inpatient appropriate because: Monitor for improvement in respiratory status.  Still on heated high flow.   Planned Discharge Destination: TBD.  SNF recommended by PT. Diet: Diet Order             Diet Heart Fluid consistency: Thin  Diet effective now                    Data Review:   Patient Lines/Drains/Airways Status     Active Line/Drains/Airways     Name Placement date Placement time Site Days   Peripheral IV 12/12/24 20 G 1.88 Right;Anterior Forearm 12/12/24  2243  Forearm  3             Inpatient Medications  Scheduled Meds:  acidophilus  1 capsule Oral TID with meals   aspirin   81 mg Oral Daily   benzonatate   100 mg Oral TID   budesonide  (PULMICORT ) nebulizer solution  0.25 mg Nebulization BID   citalopram   20 mg Oral Daily   dextromethorphan   30 mg Oral BID   fluconazole   200 mg Oral Daily   furosemide   60 mg Intravenous Once   guaiFENesin   600 mg Oral BID   heparin   5,000 Units Subcutaneous Q8H   ipratropium-albuterol   3 mL Nebulization Q6H   methylPREDNISolone  (SOLU-MEDROL ) injection  40 mg Intravenous Q12H   pantoprazole   40 mg Oral Daily   senna-docusate  2 tablet Oral BID   sodium chloride  flush  3 mL Intravenous Q12H   Continuous Infusions: PRN Meds:.acetaminophen  **OR** acetaminophen , albuterol , bisacodyl , ondansetron  **OR** ondansetron  (ZOFRAN ) IV, oxyCODONE , traZODone   DVT Prophylaxis  heparin  injection 5,000 Units Start: 12/06/24 1400 Place TED hose Start: 12/06/24 0813 SCDs Start: 12/06/24 0646     Recent Labs  Lab 12/10/24 0204 12/11/24 0447 12/12/24 0323 12/14/24 0613 12/15/24 0346  WBC 19.0* 18.7* 16.0* 14.5* 15.0*  HGB 13.6 13.6 13.2 13.9 13.5  HCT 39.4 40.1 39.0 40.3 39.8  PLT 257 258 241 249  251  MCV 95.6 97.1 97.3 96.2 96.6  MCH 33.0 32.9 32.9 33.2 32.8  MCHC 34.5 33.9 33.8 34.5 33.9  RDW 12.7 12.6 12.6 12.6 12.7  LYMPHSABS  --   --   --  0.4* 0.4*  MONOABS  --   --   --  0.4 0.4  EOSABS  --   --   --  0.0 0.0  BASOSABS  --   --   --  0.0 0.0    Recent Labs  Lab 12/09/24 0550 12/10/24 0204 12/11/24 0447 12/12/24 0323 12/13/24 0334 12/14/24 0613 12/15/24 0346  NA 134* 134* 134* 133* 132* 134* 133*  K 4.2 4.6 4.9 5.5* 5.0 4.6 4.9  CL 98 98 98 98 97* 97* 94*  CO2 26 28 29 29 29 26 30   ANIONGAP 10 9 8 7 6 11 8   GLUCOSE 160* 159* 179* 214* 227* 192* 244*  BUN 24* 30* 31* 33* 31* 30* 33*  CREATININE 0.75 1.07  0.93 0.97 1.01 0.89 0.97  AST 22 22 15  14* 11*  --   --   ALT 84* 82* 68* 58* 51*  --   --   ALKPHOS 83 72 73 67 67  --   --   BILITOT 0.4 0.5 0.4 0.4 0.5  --   --   ALBUMIN  2.9* 3.0* 3.0* 2.8* 3.1*  --   --   CRP  --   --   --  <0.5  --  <0.5  --   PROCALCITON  --   --   --  <0.10  --  <0.10  --   MG 2.3 2.5* 2.8* 2.8* 2.9* 2.6* 2.5*  PHOS  --   --   --   --   --   --  3.5  CALCIUM  8.5* 8.4* 8.5* 8.5* 8.5* 7.9* 8.4*      Recent Labs  Lab 12/11/24 0447 12/12/24 0323 12/13/24 0334 12/14/24 0613 12/15/24 0346  CRP  --  <0.5  --  <0.5  --   PROCALCITON  --  <0.10  --  <0.10  --   MG 2.8* 2.8* 2.9* 2.6* 2.5*  CALCIUM  8.5* 8.5* 8.5* 7.9* 8.4*    --------------------------------------------------------------------------------------------------------------- Lab Results  Component Value Date   CHOL 141 02/03/2023   HDL 40 02/03/2023   LDLCALC 79 02/03/2023   TRIG 123 02/03/2023   CHOLHDL 4.7 12/05/2015    No results found for: HGBA1C No results for input(s): TSH, T4TOTAL, FREET4, T3FREE, THYROIDAB in the last 72 hours. No results for input(s): VITAMINB12, FOLATE, FERRITIN, TIBC, IRON, RETICCTPCT in the last 72  hours. ------------------------------------------------------------------------------------------------------------------ Cardiac Enzymes No results for input(s): CKMB, TROPONINI, MYOGLOBIN in the last 168 hours.  Invalid input(s): CK  Micro Results Recent Results (from the past 240 hours)  Culture, blood (Routine X 2) w Reflex to ID Panel     Status: None   Collection Time: 12/06/24  5:48 AM   Specimen: BLOOD  Result Value Ref Range Status   Specimen Description BLOOD LEFT ANTECUBITAL  Final   Special Requests   Final    BOTTLES DRAWN AEROBIC AND ANAEROBIC Blood Culture results may not be optimal due to an inadequate volume of blood received in culture bottles   Culture   Final    NO GROWTH 5 DAYS Performed at [facility], [address], [place], [place] [address]    Report Status 12/11/2024 FINAL  Final  Culture, blood (Routine X 2) w Reflex to ID Panel     Status: None   Collection Time: 12/06/24  5:53 AM   Specimen: BLOOD  Result Value Ref Range Status   Specimen Description BLOOD RIGHT ANTECUBITAL  Final   Special Requests   Final    BOTTLES DRAWN AEROBIC AND ANAEROBIC Blood Culture adequate volume   Culture   Final    NO GROWTH 5 DAYS Performed at [facility], [address] [place], [place] [address]    Report Status 12/11/2024 FINAL  Final  Respiratory (~20 pathogens) panel by PCR     Status: None   Collection Time: 12/06/24 10:10 AM   Specimen: Nasal Mucosa; Respiratory  Result Value Ref Range Status   Adenovirus NOT DETECTED NOT DETECTED Final   Coronavirus 229E NOT DETECTED NOT DETECTED Final    Comment: (NOTE) The Coronavirus on the Respiratory Panel, DOES NOT test for the novel  Coronavirus (2019 nCoV)    Coronavirus HKU1 NOT DETECTED NOT DETECTED Final  Coronavirus NL63 NOT DETECTED NOT DETECTED Final   Coronavirus OC43 NOT DETECTED NOT DETECTED Final   Metapneumovirus NOT DETECTED NOT DETECTED Final   Rhinovirus / Enterovirus NOT  DETECTED NOT DETECTED Final   Influenza A NOT DETECTED NOT DETECTED Final   Influenza B NOT DETECTED NOT DETECTED Final   Parainfluenza Virus 1 NOT DETECTED NOT DETECTED Final   Parainfluenza Virus 2 NOT DETECTED NOT DETECTED Final   Parainfluenza Virus 3 NOT DETECTED NOT DETECTED Final   Parainfluenza Virus 4 NOT DETECTED NOT DETECTED Final   Respiratory Syncytial Virus NOT DETECTED NOT DETECTED Final   Bordetella pertussis NOT DETECTED NOT DETECTED Final   Bordetella Parapertussis NOT DETECTED NOT DETECTED Final   Chlamydophila pneumoniae NOT DETECTED NOT DETECTED Final   Mycoplasma pneumoniae NOT DETECTED NOT DETECTED Final    Comment: Performed at [facility], [address] [place], [place] [address]  MRSA Next Gen by PCR, Nasal     Status: None   Collection Time: 12/06/24 10:10 AM   Specimen: Nasal Mucosa; Nasal Swab  Result Value Ref Range Status   MRSA by PCR Next Gen NOT DETECTED NOT DETECTED Final    Comment: (NOTE) The GeneXpert MRSA Assay (FDA approved for NASAL specimens only), is one component of a comprehensive MRSA colonization surveillance program. It is not intended to diagnose MRSA infection nor to guide or monitor treatment for MRSA infections. Test performance is not FDA approved in patients less than 84 years old. Performed at [facility], [address] [place], [place] [address]   Expectorated Sputum Assessment w Gram Stain, Rflx to Resp Cult     Status: None   Collection Time: 12/09/24  5:50 AM   Specimen: Expectorated Sputum  Result Value Ref Range Status   Specimen Description EXPECTORATED SPUTUM  Final   Special Requests NONE  Final   Sputum evaluation   Final    THIS SPECIMEN IS ACCEPTABLE FOR SPUTUM CULTURE Performed at [facility], [address] [place], [place] [address]    Report Status 12/09/2024 FINAL  Final  Culture, Respiratory w Gram Stain     Status: None   Collection Time: 12/09/24  5:50 AM  Result Value Ref  Range Status   Specimen Description EXPECTORATED SPUTUM  Final   Special Requests NONE Reflexed from [id number]  Final   Gram Stain   Final    FEW WBC PRESENT, PREDOMINANTLY PMN FEW GRAM POSITIVE COCCI RARE BUDDING YEAST SEEN    Culture   Final    FEW Normal respiratory flora-no Staph aureus or Pseudomonas seen Performed at [facility], [address] [place], [place] [address]    Report Status 12/11/2024 FINAL  Final  MRSA Next Gen by PCR, Nasal     Status: None   Collection Time: 12/12/24  5:59 AM   Specimen: Nasal Mucosa; Nasal Swab  Result Value Ref Range Status   MRSA by PCR Next Gen NOT DETECTED NOT DETECTED Final    Comment: (NOTE) The GeneXpert MRSA Assay (FDA approved for NASAL specimens only), is one component of a comprehensive MRSA colonization surveillance program. It is not intended to diagnose MRSA infection nor to guide or monitor treatment for MRSA infections. Test performance is not FDA approved in patients less than 95 years old. Performed at [facility] Lab, [address] [place], [place] [address]     Radiology Reports  DG Chest Watson 1 View Result Date: 12/14/2024 EXAM: 1  VIEW XRAY OF THE CHEST 12/14/2024 06:55:00 AM COMPARISON: Portable chest 12/12/2024. CLINICAL HISTORY: Shortness of breath. FINDINGS: LUNGS AND PLEURA: Small pleural effusions are again noted. Bibasilar atelectasis or consolidation. Above the lung bases, the remaining lungs are clear. Overall aeration seems unchanged. No pneumothorax. HEART AND MEDIASTINUM: Stable cardiomegaly. Stable mediastinal widening and aortic tortuosity and atherosclerosis. No vascular congestion is seen. BONES AND SOFT TISSUES: No acute osseous abnormality. IMPRESSION: 1. Small pleural effusions and bibasilar atelectasis or consolidation, unchanged. 2. Stable cardiomegaly. No vascular congestion. Electronically signed by: Francis Quam MD 12/14/2024 07:27 AM EST RP Workstation: [id number]      Signature  -    Lavada Stank M.D on 12/15/2024 at 10:10 AM   -  To page go to [email or web address]

## 2024-12-16 ENCOUNTER — Inpatient Hospital Stay (HOSPITAL_COMMUNITY)

## 2024-12-16 DIAGNOSIS — J9601 Acute respiratory failure with hypoxia: Secondary | ICD-10-CM | POA: Diagnosis not present

## 2024-12-16 LAB — COMPREHENSIVE METABOLIC PANEL WITH GFR
ALT: 40 U/L (ref 0–44)
AST: 13 U/L — ABNORMAL LOW (ref 15–41)
Albumin: 3.2 g/dL — ABNORMAL LOW (ref 3.5–5.0)
Alkaline Phosphatase: 65 U/L (ref 38–126)
Anion gap: 12 (ref 5–15)
BUN: 39 mg/dL — ABNORMAL HIGH (ref 8–23)
CO2: 26 mmol/L (ref 22–32)
Calcium: 8.5 mg/dL — ABNORMAL LOW (ref 8.9–10.3)
Chloride: 93 mmol/L — ABNORMAL LOW (ref 98–111)
Creatinine, Ser: 0.92 mg/dL (ref 0.61–1.24)
GFR, Estimated: >60 mL/min
Glucose, Bld: 252 mg/dL — ABNORMAL HIGH (ref 70–99)
Potassium: 5 mmol/L (ref 3.5–5.1)
Sodium: 130 mmol/L — ABNORMAL LOW (ref 135–145)
Total Bilirubin: 0.7 mg/dL (ref 0.0–1.2)
Total Protein: 5.5 g/dL — ABNORMAL LOW (ref 6.5–8.1)

## 2024-12-16 LAB — PRO BRAIN NATRIURETIC PEPTIDE: Pro Brain Natriuretic Peptide: 172 pg/mL

## 2024-12-16 LAB — GLUCOSE, CAPILLARY: Glucose-Capillary: 235 mg/dL — ABNORMAL HIGH (ref 70–99)

## 2024-12-16 MED ORDER — LACTATED RINGERS IV SOLN: INTRAVENOUS | Status: AC

## 2024-12-16 MED ORDER — FUROSEMIDE 10 MG/ML IJ SOLN
40.0000 mg | Freq: Once | INTRAMUSCULAR | Status: AC
  Administered 2024-12-16: 40 mg via INTRAVENOUS
  Filled 2024-12-16: qty 4

## 2024-12-16 MED ORDER — SODIUM ZIRCONIUM CYCLOSILICATE 10 G PO PACK
10.0000 g | PACK | Freq: Once | ORAL | Status: AC
  Administered 2024-12-16: 10 g via ORAL
  Filled 2024-12-16: qty 1

## 2024-12-16 NOTE — Plan of Care (Signed)
   Problem: Clinical Measurements: Goal: Diagnostic test results will improve Outcome: Progressing Goal: Respiratory complications will improve Outcome: Progressing

## 2024-12-16 NOTE — Plan of Care (Signed)

## 2024-12-16 NOTE — Progress Notes (Signed)
 [facility] Progress Note Patient: Brandon [name] FMW:[id number] DOB: 08-09-1941  DOA: 12/06/2024 DOS: the patient was seen and examined on 12/16/2024  Brief Summary: Patient with PMH of COPD, HTN, HLD, AAA, depression, obesity presents to the hospital with complaints of progressively worsening cough and shortness of breath as well as confusion.  Significant events: 1/7 - 1/14 admitted for acute hypoxic respiratory failure secondary to COPD exacerbation.  Was able to be weaned down to room air at the time of the discharge. 1/22 presented due to worsening shortness of breath especially when lying down.  Admitted to the hospitalist service.  PCCM was consulted.  Lactic acid elevated.  Being treated for acute hypoxic respiratory failure secondary to COPD and pneumonia. 1/23 echocardiogram EF 55 to 60%, no WMA, no valvular abnormality, RV normal. 1/24 echo bubble study negative for any shunt. 1/25 switch to oral antibiotics  Consults: PCCM    Subjective: Patient in bed, appears comfortable, denies any headache, no fever, no chest pain or pressure, improved orthopnea and shortness of breath , no abdominal pain. No focal weakness.   Assessment and plan:  Acute hypoxic respiratory failure. Secondary to COPD exacerbation as well as acute on chronic diastolic CHF. Community-acquired pneumonia. Patient was able to be weaned down to room air during his last admission when he was presented to the hospital with COPD exacerbation. Currently on heated high flow oxygen Being treated with empiric antibiotic finish course Blood cultures negative.  MRSA PCR negative. Procalcitonin is negative. Respiratory pathogen panel negative. On IV Solu-Medrol  along with supplemental oxygen and nebulizer treatments, as needed Lasix , he does have some orthopnea, PCCM following.   Echocardiogram stable with EF 60%, no wall motion abnormality, RV pressures normal, no shunt on bubble study. Encouraged sitting in  chair using I-S flutter valve for pulmonary toiletry, sat in chair for 6 hours on 12/12/2024, moderate improvement, oxygen demand has come down.  Encouraged to continue doing it.  SpO2: 91 % O2 Flow Rate (L/min): 40 L/min FiO2 (%): (S) 60 %   Acute on chronic HFpEF.  EF 55 to 60% Being diuresed on a as needed basis, has developed some orthopnea on 12/14/2024 trial of Lasix .  Oropharyngeal candidiasis. On Diflucan . Most likely due to his recent use of steroid. Most likely will worsen with ongoing use of antibiotics and steroids.  History of SDH as well as ICH. No focal deficit. Intermittent confusion noted by family in the setting of hypoxia.  HTN. On amlodipine . Given significant lower extremity edema would recommend to discontinue this medication upon discharge.  History of CAD. PAD AAA with repair. Currently no chest pain. Echocardiogram no wall motion abnormality. EKG nonischemic. Continue aspirin   Leukocytosis. This is mostly steroid-induced. For now we will monitor.  Lactic acidosis. Likely due to increased work of breathing.  Resolved.  LFT elevation. AST ALT ratio 1 is to 2.  AST normalized.  Hard of hearing. Noted.  Obesity Class 2 Body mass index is 36.77 kg/m.  Placing the pt at higher risk of poor outcomes.  Code Status: Full Code     DVT Prophylaxis: heparin  injection 5,000 Units Start: 12/06/24 1400 Place TED hose Start: 12/06/24 0813 SCDs Start: 12/06/24 0646     Physical exam. Vitals:   12/15/24 0841 12/15/24 1151 12/15/24 1510 12/15/24 1602  BP:  119/71  98/81  Pulse:  68  77  Resp:  20  17  Temp:  98 F (36.7 C)  98.8 F (37.1 C)  TempSrc:  Oral  SpO2: 93% 90% 90% 91%  Weight:      Height:       Awake Alert, No new F.N deficits, Normal affect [place].AT,PERRAL Supple Neck, No JVD,   Symmetrical Chest wall movement, moderate air movement bilaterally, no significant wheezing, mild but positive crackles RRR,No Gallops, Rubs or new  Murmurs,  +ve B.Sounds, Abd Soft, No tenderness,   No Cyanosis, Clubbing or edema   Family Communication: Discussed with patient's sister at bedside on 12/13/2024  Disposition Plan: Status is: Inpatient Remains inpatient appropriate because: Monitor for improvement in respiratory status.  Still on heated high flow.   Planned Discharge Destination: TBD.  SNF recommended by PT. Diet: Diet Order             Diet Heart Fluid consistency: Thin  Diet effective now                    Data Review:   Patient Lines/Drains/Airways Status     Active Line/Drains/Airways     Name Placement date Placement time Site Days   Peripheral IV 12/12/24 20 G 1.88 Right;Anterior Forearm 12/12/24  2243  Forearm  4             Inpatient Medications  Scheduled Meds:  acidophilus  1 capsule Oral TID with meals   arformoterol   15 mcg Nebulization BID   aspirin   81 mg Oral Daily   benzonatate   100 mg Oral TID   budesonide  (PULMICORT ) nebulizer solution  0.5 mg Nebulization BID   citalopram   20 mg Oral Daily   dextromethorphan   30 mg Oral BID   fluconazole   200 mg Oral Daily   guaiFENesin   600 mg Oral BID   heparin   5,000 Units Subcutaneous Q8H   methylPREDNISolone  (SOLU-MEDROL ) injection  40 mg Intravenous Q12H   pantoprazole   40 mg Oral Daily   revefenacin   175 mcg Nebulization Daily   senna-docusate  2 tablet Oral BID   sodium chloride  flush  3 mL Intravenous Q12H   sodium zirconium cyclosilicate   10 g Oral Once   Continuous Infusions: PRN Meds:.acetaminophen  **OR** acetaminophen , albuterol , bisacodyl , ondansetron  **OR** ondansetron  (ZOFRAN ) IV, oxyCODONE , traZODone   DVT Prophylaxis  heparin  injection 5,000 Units Start: 12/06/24 1400 Place TED hose Start: 12/06/24 0813 SCDs Start: 12/06/24 0646     Recent Labs  Lab 12/10/24 0204 12/11/24 0447 12/12/24 0323 12/14/24 0613 12/15/24 0346  WBC 19.0* 18.7* 16.0* 14.5* 15.0*  HGB 13.6 13.6 13.2 13.9 13.5  HCT 39.4 40.1 39.0  40.3 39.8  PLT 257 258 241 249 251  MCV 95.6 97.1 97.3 96.2 96.6  MCH 33.0 32.9 32.9 33.2 32.8  MCHC 34.5 33.9 33.8 34.5 33.9  RDW 12.7 12.6 12.6 12.6 12.7  LYMPHSABS  --   --   --  0.4* 0.4*  MONOABS  --   --   --  0.4 0.4  EOSABS  --   --   --  0.0 0.0  BASOSABS  --   --   --  0.0 0.0    Recent Labs  Lab 12/10/24 0204 12/11/24 0447 12/12/24 0323 12/13/24 0334 12/14/24 0613 12/15/24 0346  NA 134* 134* 133* 132* 134* 133*  K 4.6 4.9 5.5* 5.0 4.6 4.9  CL 98 98 98 97* 97* 94*  CO2 28 29 29 29 26 30   ANIONGAP 9 8 7 6 11 8   GLUCOSE 159* 179* 214* 227* 192* 244*  BUN 30* 31* 33* 31* 30* 33*  CREATININE 1.07 0.93 0.97 1.01  0.89 0.97  AST 22 15 14* 11*  --   --   ALT 82* 68* 58* 51*  --   --   ALKPHOS 72 73 67 67  --   --   BILITOT 0.5 0.4 0.4 0.5  --   --   ALBUMIN  3.0* 3.0* 2.8* 3.1*  --   --   CRP  --   --  <0.5  --  <0.5  --   PROCALCITON  --   --  <0.10  --  <0.10  --   MG 2.5* 2.8* 2.8* 2.9* 2.6* 2.5*  PHOS  --   --   --   --   --  3.5  CALCIUM  8.4* 8.5* 8.5* 8.5* 7.9* 8.4*      Recent Labs  Lab 12/11/24 0447 12/12/24 0323 12/13/24 0334 12/14/24 0613 12/15/24 0346  CRP  --  <0.5  --  <0.5  --   PROCALCITON  --  <0.10  --  <0.10  --   MG 2.8* 2.8* 2.9* 2.6* 2.5*  CALCIUM  8.5* 8.5* 8.5* 7.9* 8.4*    --------------------------------------------------------------------------------------------------------------- Lab Results  Component Value Date   CHOL 141 02/03/2023   HDL 40 02/03/2023   LDLCALC 79 02/03/2023   TRIG 123 02/03/2023   CHOLHDL 4.7 12/05/2015    No results found for: HGBA1C No results for input(s): TSH, T4TOTAL, FREET4, T3FREE, THYROIDAB in the last 72 hours. No results for input(s): VITAMINB12, FOLATE, FERRITIN, TIBC, IRON, RETICCTPCT in the last 72 hours. ------------------------------------------------------------------------------------------------------------------ Cardiac Enzymes No results for input(s):  CKMB, TROPONINI, MYOGLOBIN in the last 168 hours.  Invalid input(s): CK  Micro Results Recent Results (from the past 240 hours)  Respiratory (~20 pathogens) panel by PCR     Status: None   Collection Time: 12/06/24 10:10 AM   Specimen: Nasal Mucosa; Respiratory  Result Value Ref Range Status   Adenovirus NOT DETECTED NOT DETECTED Final   Coronavirus 229E NOT DETECTED NOT DETECTED Final    Comment: (NOTE) The Coronavirus on the Respiratory Panel, DOES NOT test for the novel  Coronavirus (2019 nCoV)    Coronavirus HKU1 NOT DETECTED NOT DETECTED Final   Coronavirus NL63 NOT DETECTED NOT DETECTED Final   Coronavirus OC43 NOT DETECTED NOT DETECTED Final   Metapneumovirus NOT DETECTED NOT DETECTED Final   Rhinovirus / Enterovirus NOT DETECTED NOT DETECTED Final   Influenza A NOT DETECTED NOT DETECTED Final   Influenza B NOT DETECTED NOT DETECTED Final   Parainfluenza Virus 1 NOT DETECTED NOT DETECTED Final   Parainfluenza Virus 2 NOT DETECTED NOT DETECTED Final   Parainfluenza Virus 3 NOT DETECTED NOT DETECTED Final   Parainfluenza Virus 4 NOT DETECTED NOT DETECTED Final   Respiratory Syncytial Virus NOT DETECTED NOT DETECTED Final   Bordetella pertussis NOT DETECTED NOT DETECTED Final   Bordetella Parapertussis NOT DETECTED NOT DETECTED Final   Chlamydophila pneumoniae NOT DETECTED NOT DETECTED Final   Mycoplasma pneumoniae NOT DETECTED NOT DETECTED Final    Comment: Performed at [facility], [address] [place], [place] [address]  MRSA Next Gen by PCR, Nasal     Status: None   Collection Time: 12/06/24 10:10 AM   Specimen: Nasal Mucosa; Nasal Swab  Result Value Ref Range Status   MRSA by PCR Next Gen NOT DETECTED NOT DETECTED Final    Comment: (NOTE) The GeneXpert MRSA Assay (FDA approved for NASAL specimens only), is one component of a comprehensive MRSA colonization surveillance program. It is not intended  to diagnose MRSA infection nor to guide or monitor  treatment for MRSA infections. Test performance is not FDA approved in patients less than 49 years old. Performed at [facility], [address], [place], [place] [address]   Expectorated Sputum Assessment w Gram Stain, Rflx to Resp Cult     Status: None   Collection Time: 12/09/24  5:50 AM   Specimen: Expectorated Sputum  Result Value Ref Range Status   Specimen Description EXPECTORATED SPUTUM  Final   Special Requests NONE  Final   Sputum evaluation   Final    THIS SPECIMEN IS ACCEPTABLE FOR SPUTUM CULTURE Performed at [facility], [address] [place], [place] [address]    Report Status 12/09/2024 FINAL  Final  Culture, Respiratory w Gram Stain     Status: None   Collection Time: 12/09/24  5:50 AM  Result Value Ref Range Status   Specimen Description EXPECTORATED SPUTUM  Final   Special Requests NONE Reflexed from [id number]  Final   Gram Stain   Final    FEW WBC PRESENT, PREDOMINANTLY PMN FEW GRAM POSITIVE COCCI RARE BUDDING YEAST SEEN    Culture   Final    FEW Normal respiratory flora-no Staph aureus or Pseudomonas seen Performed at [facility], [address], [place], [place] [address]    Report Status 12/11/2024 FINAL  Final  MRSA Next Gen by PCR, Nasal     Status: None   Collection Time: 12/12/24  5:59 AM   Specimen: Nasal Mucosa; Nasal Swab  Result Value Ref Range Status   MRSA by PCR Next Gen NOT DETECTED NOT DETECTED Final    Comment: (NOTE) The GeneXpert MRSA Assay (FDA approved for NASAL specimens only), is one component of a comprehensive MRSA colonization surveillance program. It is not intended to diagnose MRSA infection nor to guide or monitor treatment for MRSA infections. Test performance is not FDA approved in patients less than 28 years old. Performed at [facility], [address] [place], [place] [address]     Radiology Reports  DG Chest Port 1 View Result Date: 12/16/2024 EXAM: 1 VIEW XRAY OF THE CHEST 12/16/2024  06:26:00 AM COMPARISON: 12/14/2024 CLINICAL HISTORY: Shortness of breath. FINDINGS: LUNGS AND PLEURA: Small bilateral pleural effusions. Bilateral lower lobe atelectasis. No pneumothorax. HEART AND MEDIASTINUM: Stable cardiac enlargement. BONES AND SOFT TISSUES: No acute osseous abnormality. IMPRESSION: 1. Unchanged small bilateral pleural effusions and bilateral lower lobe atelectasis. 2. Stable cardiac enlargement. Electronically signed by: Waddell Calk MD 12/16/2024 06:30 AM EST RP Workstation: [id number]      Signature  -   Lavada Stank M.D on 12/16/2024 at 9:04 AM   -  To page go to [email or web address]

## 2024-12-17 DIAGNOSIS — J9601 Acute respiratory failure with hypoxia: Secondary | ICD-10-CM | POA: Diagnosis not present

## 2024-12-17 LAB — COMPREHENSIVE METABOLIC PANEL WITH GFR
ALT: 40 U/L (ref 0–44)
AST: 13 U/L — ABNORMAL LOW (ref 15–41)
Albumin: 3.3 g/dL — ABNORMAL LOW (ref 3.5–5.0)
Alkaline Phosphatase: 66 U/L (ref 38–126)
Anion gap: 13 (ref 5–15)
BUN: 45 mg/dL — ABNORMAL HIGH (ref 8–23)
CO2: 29 mmol/L (ref 22–32)
Calcium: 8.4 mg/dL — ABNORMAL LOW (ref 8.9–10.3)
Chloride: 90 mmol/L — ABNORMAL LOW (ref 98–111)
Creatinine, Ser: 1.1 mg/dL (ref 0.61–1.24)
GFR, Estimated: >60 mL/min
Glucose, Bld: 189 mg/dL — ABNORMAL HIGH (ref 70–99)
Potassium: 4.5 mmol/L (ref 3.5–5.1)
Sodium: 132 mmol/L — ABNORMAL LOW (ref 135–145)
Total Bilirubin: 0.7 mg/dL (ref 0.0–1.2)
Total Protein: 5.7 g/dL — ABNORMAL LOW (ref 6.5–8.1)

## 2024-12-17 LAB — CBC WITH DIFFERENTIAL/PLATELET
Abs Immature Granulocytes: 0.26 10*3/uL — ABNORMAL HIGH (ref 0.00–0.07)
Basophils Absolute: 0 10*3/uL (ref 0.0–0.1)
Basophils Relative: 0 %
Eosinophils Absolute: 0 10*3/uL (ref 0.0–0.5)
Eosinophils Relative: 0 %
HCT: 42.2 % (ref 39.0–52.0)
Hemoglobin: 14.9 g/dL (ref 13.0–17.0)
Immature Granulocytes: 1 %
Lymphocytes Relative: 3 %
Lymphs Abs: 0.5 10*3/uL — ABNORMAL LOW (ref 0.7–4.0)
MCH: 33.6 pg (ref 26.0–34.0)
MCHC: 35.3 g/dL (ref 30.0–36.0)
MCV: 95 fL (ref 80.0–100.0)
Monocytes Absolute: 0.7 10*3/uL (ref 0.1–1.0)
Monocytes Relative: 4 %
Neutro Abs: 17.4 10*3/uL — ABNORMAL HIGH (ref 1.7–7.7)
Neutrophils Relative %: 92 %
Platelets: 276 10*3/uL (ref 150–400)
RBC: 4.44 MIL/uL (ref 4.22–5.81)
RDW: 12.7 % (ref 11.5–15.5)
WBC: 18.9 10*3/uL — ABNORMAL HIGH (ref 4.0–10.5)
nRBC: 0 % (ref 0.0–0.2)

## 2024-12-17 LAB — C-REACTIVE PROTEIN: CRP: <0.5 mg/dL

## 2024-12-17 LAB — MAGNESIUM: Magnesium: 2.6 mg/dL — ABNORMAL HIGH (ref 1.7–2.4)

## 2024-12-17 LAB — PROCALCITONIN: Procalcitonin: 0.1 ng/mL

## 2024-12-17 LAB — PRO BRAIN NATRIURETIC PEPTIDE: Pro Brain Natriuretic Peptide: 152 pg/mL

## 2024-12-17 LAB — GLUCOSE, CAPILLARY: Glucose-Capillary: 171 mg/dL — ABNORMAL HIGH (ref 70–99)

## 2024-12-17 LAB — PHOSPHORUS: Phosphorus: 4.1 mg/dL (ref 2.5–4.6)

## 2024-12-17 MED ORDER — METHYLPREDNISOLONE SODIUM SUCC 40 MG IJ SOLR
40.0000 mg | Freq: Every day | INTRAMUSCULAR | Status: DC
  Administered 2024-12-18 – 2024-12-21 (×4): 40 mg via INTRAVENOUS
  Filled 2024-12-17 (×4): qty 1

## 2024-12-17 MED ORDER — FUROSEMIDE 10 MG/ML IJ SOLN
40.0000 mg | Freq: Once | INTRAMUSCULAR | Status: AC
  Administered 2024-12-17: 40 mg via INTRAVENOUS
  Filled 2024-12-17: qty 4

## 2024-12-17 NOTE — Plan of Care (Signed)
   Problem: Health Behavior/Discharge Planning: Goal: Ability to manage health-related needs will improve Outcome: Progressing   Problem: Clinical Measurements: Goal: Will remain free from infection Outcome: Progressing

## 2024-12-17 NOTE — TOC Progression Note (Signed)
 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 Continued medical workup.  CM will continue to follow.  Expected Discharge Plan:  (TBD) Barriers to Discharge: Continued Medical Work up               Expected Discharge Plan and Services       Living arrangements for the past 2 months: Single Family Home                                       Social Drivers of Health (SDOH) Interventions SDOH Screenings   Food Insecurity: No Food Insecurity (12/06/2024)  Housing: Low Risk (12/06/2024)  Transportation Needs: No Transportation Needs (12/06/2024)  Utilities: Not At Risk (12/06/2024)  Depression (PHQ2-9): Low Risk (09/22/2022)  Social Connections: Moderately Isolated (12/06/2024)  Tobacco Use: Medium Risk (12/06/2024)    Readmission Risk Interventions    11/22/2024    2:11 PM  Readmission Risk Prevention Plan  Post Dischage Appt Complete  Medication Screening Complete  Transportation Screening Complete

## 2024-12-18 DIAGNOSIS — J9601 Acute respiratory failure with hypoxia: Secondary | ICD-10-CM | POA: Diagnosis not present

## 2024-12-18 LAB — CBC WITH DIFFERENTIAL/PLATELET
Abs Immature Granulocytes: 0.41 10*3/uL — ABNORMAL HIGH (ref 0.00–0.07)
Basophils Absolute: 0 10*3/uL (ref 0.0–0.1)
Basophils Relative: 0 %
Eosinophils Absolute: 0 10*3/uL (ref 0.0–0.5)
Eosinophils Relative: 0 %
HCT: 45.3 % (ref 39.0–52.0)
Hemoglobin: 15.7 g/dL (ref 13.0–17.0)
Immature Granulocytes: 2 %
Lymphocytes Relative: 3 %
Lymphs Abs: 0.7 10*3/uL (ref 0.7–4.0)
MCH: 33 pg (ref 26.0–34.0)
MCHC: 34.7 g/dL (ref 30.0–36.0)
MCV: 95.2 fL (ref 80.0–100.0)
Monocytes Absolute: 1.3 10*3/uL — ABNORMAL HIGH (ref 0.1–1.0)
Monocytes Relative: 5 %
Neutro Abs: 21.6 10*3/uL — ABNORMAL HIGH (ref 1.7–7.7)
Neutrophils Relative %: 90 %
Platelets: 294 10*3/uL (ref 150–400)
RBC: 4.76 MIL/uL (ref 4.22–5.81)
RDW: 12.7 % (ref 11.5–15.5)
WBC: 24 10*3/uL — ABNORMAL HIGH (ref 4.0–10.5)
nRBC: 0 % (ref 0.0–0.2)

## 2024-12-18 LAB — COMPREHENSIVE METABOLIC PANEL WITH GFR
ALT: 43 U/L (ref 0–44)
AST: 14 U/L — ABNORMAL LOW (ref 15–41)
Albumin: 3.5 g/dL (ref 3.5–5.0)
Alkaline Phosphatase: 65 U/L (ref 38–126)
Anion gap: 10 (ref 5–15)
BUN: 45 mg/dL — ABNORMAL HIGH (ref 8–23)
CO2: 30 mmol/L (ref 22–32)
Calcium: 9 mg/dL (ref 8.9–10.3)
Chloride: 91 mmol/L — ABNORMAL LOW (ref 98–111)
Creatinine, Ser: 1.09 mg/dL (ref 0.61–1.24)
GFR, Estimated: >60 mL/min
Glucose, Bld: 155 mg/dL — ABNORMAL HIGH (ref 70–99)
Potassium: 4.5 mmol/L (ref 3.5–5.1)
Sodium: 131 mmol/L — ABNORMAL LOW (ref 135–145)
Total Bilirubin: 0.8 mg/dL (ref 0.0–1.2)
Total Protein: 5.9 g/dL — ABNORMAL LOW (ref 6.5–8.1)

## 2024-12-18 LAB — GLUCOSE, CAPILLARY: Glucose-Capillary: 127 mg/dL — ABNORMAL HIGH (ref 70–99)

## 2024-12-18 LAB — PRO BRAIN NATRIURETIC PEPTIDE: Pro Brain Natriuretic Peptide: 122 pg/mL

## 2024-12-18 LAB — C-REACTIVE PROTEIN: CRP: <0.5 mg/dL

## 2024-12-18 LAB — PHOSPHORUS: Phosphorus: 4.3 mg/dL (ref 2.5–4.6)

## 2024-12-18 LAB — MAGNESIUM: Magnesium: 2.8 mg/dL — ABNORMAL HIGH (ref 1.7–2.4)

## 2024-12-18 LAB — PROCALCITONIN: Procalcitonin: 0.12 ng/mL

## 2024-12-18 MED ORDER — FUROSEMIDE 10 MG/ML IJ SOLN
40.0000 mg | Freq: Once | INTRAMUSCULAR | Status: AC
  Administered 2024-12-18: 40 mg via INTRAVENOUS
  Filled 2024-12-18: qty 4

## 2024-12-18 NOTE — Progress Notes (Signed)
 Physical Therapy Treatment Patient Details Name: Brandon [name] MRN: [id number] DOB: 1941/07/11 Today's Date: 12/18/2024   History of Present Illness 84 y.o male presents to [facility] on 1/22 with sudden onset SOB. Currently on 30L HHFNC. PMH includes: HTN, HLD, AAA s/p repair 2013, COPD, and PVD.    PT Comments  Pt received in supine and agreeable to session. Pt with labored breathing at rest, so deferred gait progression. Pt able to walk short distance to the recliner with CGA, but then requires a seated rest break. Pt requires increased time to recover due to DOE, but is able to tolerate additional standing trial with static standing marches. SpO2 drops into 80's with mobility, although poor pleth noted. Pt continues to benefit from PT services to progress toward functional mobility goals.    If plan is discharge home, recommend the following: A little help with walking and/or transfers;A little help with bathing/dressing/bathroom;Assistance with cooking/housework;Assist for transportation;Help with stairs or ramp for entrance;Direct supervision/assist for medications management   Can travel by private vehicle        Equipment Recommendations  Rolling walker (2 wheels);BSC/3in1    Recommendations for Other Services       Precautions / Restrictions Precautions Precautions: Fall Recall of Precautions/Restrictions: Intact Precaution/Restrictions Comments: Monitor O2 Restrictions Weight Bearing Restrictions Per Provider Order: No     Mobility  Bed Mobility Overal bed mobility: Needs Assistance Bed Mobility: Supine to Sit     Supine to sit: Contact guard     General bed mobility comments: increased time due to DOE    Transfers Overall transfer level: Needs assistance Equipment used: Rolling walker (2 wheels) Transfers: Sit to/from Stand Sit to Stand: Min assist, Contact guard assist           General transfer comment: STS from elevated EOB with CGA and from lower recliner  with light min A. Cues for hand placement    Ambulation/Gait Ambulation/Gait assistance: Contact guard assist Gait Distance (Feet): 3 Feet Assistive device: Rolling walker (2 wheels) Gait Pattern/deviations: Step-through pattern, Decreased stride length, Trunk flexed, Narrow base of support       General Gait Details: short distance to the recliner with CGA for safety, but fair stability with RW support. Requires seated rest break once in front of chair   Stairs             Wheelchair Mobility     Tilt Bed    Modified Rankin (Stroke Patients Only)       Balance Overall balance assessment: Needs assistance Sitting-balance support: No upper extremity supported, Feet supported Sitting balance-Leahy Scale: Good     Standing balance support: Bilateral upper extremity supported, During functional activity, Reliant on assistive device for balance Standing balance-Leahy Scale: Poor Standing balance comment: reliant on RW                            Communication Communication Communication: Impaired Factors Affecting Communication: Hearing impaired  Cognition Arousal: Alert Behavior During Therapy: WFL for tasks assessed/performed   PT - Cognitive impairments: No apparent impairments                         Following commands: Intact      Cueing Cueing Techniques: Verbal cues, Tactile cues, Visual cues  Exercises General Exercises - Lower Extremity Hip Flexion/Marching: AROM, 10 reps, Standing, Both    General Comments General comments (skin integrity, edema, etc.): SpO2  intermittently dropping into 80's with mobility, but unstable during mobility      Pertinent Vitals/Pain Pain Assessment Pain Assessment: No/denies pain     PT Goals (current goals can now be found in the care plan section) Acute Rehab PT Goals Patient Stated Goal: Get well, improve strength and endurance again PT Goal Formulation: With patient/family Time For Goal  Achievement: 12/21/24 Progress towards PT goals: Progressing toward goals    Frequency    Min 2X/week       AM-PAC PT 6 Clicks Mobility   Outcome Measure  Help needed turning from your back to your side while in a flat bed without using bedrails?: A Little Help needed moving from lying on your back to sitting on the side of a flat bed without using bedrails?: A Little Help needed moving to and from a bed to a chair (including a wheelchair)?: A Little Help needed standing up from a chair using your arms (e.g., wheelchair or bedside chair)?: A Little Help needed to walk in hospital room?: A Little Help needed climbing 3-5 steps with a railing? : Total 6 Click Score: 16    End of Session Equipment Utilized During Treatment: Oxygen;Gait belt Activity Tolerance: Other (comment) (DOE) Patient left: in chair;with call bell/phone within reach;with family/visitor present Nurse Communication: Mobility status PT Visit Diagnosis: Unsteadiness on feet (R26.81);Muscle weakness (generalized) (M62.81)     Time: 1202-1222 PT Time Calculation (min) (ACUTE ONLY): 20 min  Charges:    $Therapeutic Activity: 8-22 mins PT General Charges $$ ACUTE PT VISIT: 1 Visit                    Darryle George, PTA Acute Rehabilitation Services Secure Chat Preferred  [phone]    Darryle George 12/18/2024, 1:19 PM

## 2024-12-18 NOTE — Plan of Care (Signed)

## 2024-12-18 NOTE — Progress Notes (Signed)
 "  NAME:  Brandon [name], MRN:  [id number], DOB:  [birth year], LOS: 12 ADMISSION DATE:  12/06/2024, CONSULTATION DATE:  12/18/24 REFERRING MD:  Dr. Marcene, CHIEF COMPLAINT:  COPD exacerbation   History of Present Illness:   Brandon [name] is an 84 y/o M with a PMH significant for HTN, HLD, AAA w/ repair, Grade II COPD (FEV1 1.59 L, 55% predicted) who presents with shortness of breath, cough and acute hypoxic respiratory failure. Pulmonology consulted for evaluation and management of the latter.  He has a history of COPD, grade two, with multiple admissions for exacerbations. Shortness of breath started suddenly this morning around 1:30 AM. A similar episode occurred two weeks ago, but he thought he was improving until the current episode.  He denies using home oxygen and was not discharged with oxygen after previous hospitalizations. He experiences shortness of breath sometimes when lying flat and has noticed swelling in his feet and legs, which is worse than usual. No fever, chills, night sweats, or coughing up sputum or blood.  He uses an inhaler, Breztri , two puffs in the morning and two puffs in the evening, and rinses his mouth after use. He also has a nebulizer at home.  He quit smoking cigarettes 13 years ago. Pertinent  Medical History   Past Medical History:  Diagnosis Date   AAA (abdominal aortic aneurysm)    COPD (chronic obstructive pulmonary disease) (HCC)    DIC (disseminated intravascular coagulation) 09/09/2012   Hyperlipidemia    Hypertension    Leg wound, left 12/12/2013   Peripheral vascular disease    Respiratory failure, post-operative 09/09/2012   Shortness of breath    Significant Hospital Events: Including procedures, antibiotic start and stop dates in addition to other pertinent events   1/7 - 1/14 admitted for acute hypoxic respiratory failure secondary to COPD exacerbation.  Was able to be weaned down to room air at the time of the discharge. 1/22 presented  due to worsening shortness of breath especially when lying down.  Admitted to the hospitalist service.  PCCM was consulted.  Lactic acid elevated. 1/23 echocardiogram EF 55 to 60%, no WMA, no valvular abnormality, RV normal. Interim History / Subjective:   Remains on HHFNC 30% / 20 L Diuresed 1.7 L  Objective    Blood pressure 127/68, pulse 73, temperature 98.1 F (36.7 C), temperature source Oral, resp. rate (!) 22, height 5' 11 (1.803 m), weight 114.1 kg, SpO2 94%.    FiO2 (%):  [30 %-33 %] 30 %   Intake/Output Summary (Last 24 hours) at 12/18/2024 1300 Last data filed at 12/18/2024 1030 Gross per 24 hour  Intake --  Output 1475 ml  Net -1475 ml   Filed Weights   12/13/24 0500 12/17/24 0500 12/18/24 0439  Weight: 118.2 kg 115.4 kg 114.1 kg    Examination: General: Elderly obese male,in bed, no distress Lungs: No accessory muscle use, no rhonchi S1-S2 regular, no murmur Abdomen: Soft, obese, nontender Neuro: Hard of hearing..  Alert, interactive, nonfocal 1+ bipedal edema      Latest Ref Rng & Units 10/16/2024    3:27 PM  PFT Results  FVC-Pre L 2.85   FVC-Predicted Pre % 69   FVC-Post L 2.69   FVC-Predicted Post % 66   Pre FEV1/FVC % % 56   Post FEV1/FCV % % 58   FEV1-Pre L 1.59   FEV1-Predicted Pre % 55   FEV1-Post L 1.57   DLCO uncorrected ml/min/mmHg 14.09   DLCO UNC% % 57  DLCO corrected ml/min/mmHg 14.09   DLCO COR %Predicted % 57   DLVA Predicted % 82     LABS   Stable hyponatremia, low BNP low procalcitonin, increased leukocytosis  PULMONARY No results for input(s): PHART, PCO2ART, PO2ART, HCO3, TCO2, O2SAT in the last 168 hours.  Invalid input(s): PCO2, PO2  CBC Recent Labs  Lab 12/15/24 0346 12/17/24 0216 12/18/24 0253  HGB 13.5 14.9 15.7  HCT 39.8 42.2 45.3  WBC 15.0* 18.9* 24.0*  PLT 251 276 294    COAGULATION No results for input(s): INR in the last 168 hours.  CARDIAC  No results for input(s): TROPONINI in  the last 168 hours. Recent Labs  Lab 12/12/24 0323 12/14/24 0613 12/16/24 0909 12/17/24 0216 12/18/24 0253  PROBNP 101.0 144.0 172.0 152.0 122.0     CHEMISTRY Recent Labs  Lab 12/13/24 0334 12/14/24 0613 12/15/24 0346 12/16/24 0909 12/17/24 0216 12/18/24 0253  NA 132* 134* 133* 130* 132* 131*  K 5.0 4.6 4.9 5.0 4.5 4.5  CL 97* 97* 94* 93* 90* 91*  CO2 29 26 30 26 29 30   GLUCOSE 227* 192* 244* 252* 189* 155*  BUN 31* 30* 33* 39* 45* 45*  CREATININE 1.01 0.89 0.97 0.92 1.10 1.09  CALCIUM  8.5* 7.9* 8.4* 8.5* 8.4* 9.0  MG 2.9* 2.6* 2.5*  --  2.6* 2.8*  PHOS  --   --  3.5  --  4.1 4.3   Estimated Creatinine Clearance: 68.3 mL/min (by C-G formula based on SCr of 1.09 mg/dL).   LIVER Recent Labs  Lab 12/12/24 0323 12/13/24 0334 12/16/24 0909 12/17/24 0216 12/18/24 0253  AST 14* 11* 13* 13* 14*  ALT 58* 51* 40 40 43  ALKPHOS 67 67 65 66 65  BILITOT 0.4 0.5 0.7 0.7 0.8  PROT 5.3* 5.4* 5.5* 5.7* 5.9*  ALBUMIN  2.8* 3.1* 3.2* 3.3* 3.5     INFECTIOUS Recent Labs  Lab 12/14/24 0613 12/17/24 0216 12/18/24 0253  PROCALCITON <0.10 0.10 0.12     ENDOCRINE CBG (last 3)  Recent Labs    12/16/24 0808 12/17/24 0736 12/18/24 0821  GLUCAP 235* 171* 127*   Imaging: - Chest x-ray with bibasilar infiltrates. CT chest June 2026 done during previous admission: Showed emphysematous changes without any PE. PFT 2025 moderate obstruction with moderate reduction in DLCO, no BD response Echo moderate LVH, EF normal, bubble study negative.      Resolved problem list   Assessment and Plan  BAseline COPD Gold stge 2: No home oxygen use or recent smoking Acute exacerbation of COPD  Acute hypoxemic respiratory failure: ?  Bibasilar pneumonia:  Severe hypoxemia out of proportion to pulmonary infiltrates, no evidence of severe PAH or cor pulmonale, CT angiogram chest was -1/7, bubble study negative for shunt.  On prior hospitalization, similar hypoxia improved with  treatment of COPD  -Budesonide , add Brovana  and Yupelri , DC Atrovent  - With decreasing Solu-Medrol , his sugars are better controlled, leukocytosis still increasing  - Goal SpO2 88-92%, now likely due to flow is decreased he can transition to salter nasal cannula - Diurese daily with Lasix  until BUN or sodium rises, pedal edema may be related to amlodipine   He is definitely slowly improving  PCCM to follow intermittently  Harden Staff MD. DEBE. [facility] Pager : [phone]  If no response to pager , please call [phone] until 7 pm After 7:00 pm call Elink  [phone]      12/18/2024, 1:00 PM  "

## 2024-12-19 ENCOUNTER — Inpatient Hospital Stay (HOSPITAL_COMMUNITY)

## 2024-12-19 LAB — CBC WITH DIFFERENTIAL/PLATELET
Abs Immature Granulocytes: 0.43 10*3/uL — ABNORMAL HIGH (ref 0.00–0.07)
Basophils Absolute: 0.1 10*3/uL (ref 0.0–0.1)
Basophils Relative: 0 %
Eosinophils Absolute: 0 10*3/uL (ref 0.0–0.5)
Eosinophils Relative: 0 %
HCT: 44.3 % (ref 39.0–52.0)
Hemoglobin: 15.4 g/dL (ref 13.0–17.0)
Immature Granulocytes: 2 %
Lymphocytes Relative: 3 %
Lymphs Abs: 0.7 10*3/uL (ref 0.7–4.0)
MCH: 33.2 pg (ref 26.0–34.0)
MCHC: 34.8 g/dL (ref 30.0–36.0)
MCV: 95.5 fL (ref 80.0–100.0)
Monocytes Absolute: 1.2 10*3/uL — ABNORMAL HIGH (ref 0.1–1.0)
Monocytes Relative: 5 %
Neutro Abs: 20.7 10*3/uL — ABNORMAL HIGH (ref 1.7–7.7)
Neutrophils Relative %: 90 %
Platelets: 285 10*3/uL (ref 150–400)
RBC: 4.64 MIL/uL (ref 4.22–5.81)
RDW: 12.9 % (ref 11.5–15.5)
WBC: 23.1 10*3/uL — ABNORMAL HIGH (ref 4.0–10.5)
nRBC: 0 % (ref 0.0–0.2)

## 2024-12-19 LAB — COMPREHENSIVE METABOLIC PANEL WITH GFR
ALT: 45 U/L — ABNORMAL HIGH (ref 0–44)
AST: 16 U/L (ref 15–41)
Albumin: 3.4 g/dL — ABNORMAL LOW (ref 3.5–5.0)
Alkaline Phosphatase: 62 U/L (ref 38–126)
Anion gap: 9 (ref 5–15)
BUN: 43 mg/dL — ABNORMAL HIGH (ref 8–23)
CO2: 30 mmol/L (ref 22–32)
Calcium: 8.9 mg/dL (ref 8.9–10.3)
Chloride: 91 mmol/L — ABNORMAL LOW (ref 98–111)
Creatinine, Ser: 1.02 mg/dL (ref 0.61–1.24)
GFR, Estimated: >60 mL/min
Glucose, Bld: 145 mg/dL — ABNORMAL HIGH (ref 70–99)
Potassium: 4.2 mmol/L (ref 3.5–5.1)
Sodium: 130 mmol/L — ABNORMAL LOW (ref 135–145)
Total Bilirubin: 0.9 mg/dL (ref 0.0–1.2)
Total Protein: 5.8 g/dL — ABNORMAL LOW (ref 6.5–8.1)

## 2024-12-19 LAB — PRO BRAIN NATRIURETIC PEPTIDE: Pro Brain Natriuretic Peptide: 112 pg/mL

## 2024-12-19 LAB — C-REACTIVE PROTEIN: CRP: <0.5 mg/dL

## 2024-12-19 LAB — MAGNESIUM: Magnesium: 2.8 mg/dL — ABNORMAL HIGH (ref 1.7–2.4)

## 2024-12-19 LAB — GLUCOSE, CAPILLARY: Glucose-Capillary: 123 mg/dL — ABNORMAL HIGH (ref 70–99)

## 2024-12-19 LAB — PROCALCITONIN: Procalcitonin: 0.17 ng/mL

## 2024-12-19 MED ORDER — FUROSEMIDE 10 MG/ML IJ SOLN
60.0000 mg | Freq: Once | INTRAMUSCULAR | Status: AC
  Administered 2024-12-19: 60 mg via INTRAVENOUS
  Filled 2024-12-19: qty 6

## 2024-12-19 MED ORDER — IOHEXOL 350 MG/ML SOLN
75.0000 mL | Freq: Once | INTRAVENOUS | Status: AC | PRN
  Administered 2024-12-19: 75 mL via INTRAVENOUS

## 2024-12-19 MED ORDER — FUROSEMIDE 40 MG PO TABS
40.0000 mg | ORAL_TABLET | Freq: Every day | ORAL | Status: DC
  Administered 2024-12-19: 40 mg via ORAL
  Filled 2024-12-19: qty 1

## 2024-12-19 MED ORDER — FUROSEMIDE 10 MG/ML IJ SOLN
40.0000 mg | Freq: Once | INTRAMUSCULAR | Status: AC
  Administered 2024-12-19: 40 mg via INTRAVENOUS
  Filled 2024-12-19: qty 4

## 2024-12-19 NOTE — Progress Notes (Signed)
 "  NAME:  Brandon [name], MRN:  [id number], DOB:  [birth year], LOS: 13 ADMISSION DATE:  12/06/2024, CONSULTATION DATE:  12/19/24 REFERRING MD:  Dr. Marcene, CHIEF COMPLAINT:  COPD exacerbation   History of Present Illness:   Brandon [name] is an 84 y/o M with a PMH significant for HTN, HLD, AAA w/ repair, Grade II COPD (FEV1 1.59 L, 55% predicted) who presents with shortness of breath, cough and acute hypoxic respiratory failure. Pulmonology consulted for evaluation and management of the latter.  He has a history of COPD, grade two, with multiple admissions for exacerbations. Shortness of breath started suddenly this morning around 1:30 AM. A similar episode occurred two weeks ago, but he thought he was improving until the current episode.  He denies using home oxygen and was not discharged with oxygen after previous hospitalizations. He experiences shortness of breath sometimes when lying flat and has noticed swelling in his feet and legs, which is worse than usual. No fever, chills, night sweats, or coughing up sputum or blood.  He uses an inhaler, Breztri , two puffs in the morning and two puffs in the evening, and rinses his mouth after use. He also has a nebulizer at home.  He quit smoking cigarettes 13 years ago. Pertinent  Medical History   Past Medical History:  Diagnosis Date   AAA (abdominal aortic aneurysm)    COPD (chronic obstructive pulmonary disease) (HCC)    DIC (disseminated intravascular coagulation) 09/09/2012   Hyperlipidemia    Hypertension    Leg wound, left 12/12/2013   Peripheral vascular disease    Respiratory failure, post-operative 09/09/2012   Shortness of breath    Significant Hospital Events: Including procedures, antibiotic start and stop dates in addition to other pertinent events   1/7 - 1/14 admitted for acute hypoxic respiratory failure secondary to COPD exacerbation.  Was able to be weaned down to room air at the time of the discharge. 1/22 presented  due to worsening shortness of breath especially when lying down.  Admitted to the hospitalist service.  PCCM was consulted.  Lactic acid elevated. 1/23 echocardiogram EF 55 to 60%, no WMA, no valvular abnormality, RV normal. 2/3 transition from [facility] to 8 L salter Interim History / Subjective:   He was on 8 L salter nasal cannula Developed back pain and desaturations and after increasing to 15 L was placed back on HHFNC Given 60 of Lasix  IV States breathing better Sister at bedside   Objective    Blood pressure 112/72, pulse 81, temperature (!) 97.5 F (36.4 C), temperature source Oral, resp. rate (!) 25, height 5' 11 (1.803 m), weight 113.6 kg, SpO2 97%.    FiO2 (%):  [40 %] 40 %   Intake/Output Summary (Last 24 hours) at 12/19/2024 1707 Last data filed at 12/19/2024 1443 Gross per 24 hour  Intake 3 ml  Output 2100 ml  Net -2097 ml   Filed Weights   12/17/24 0500 12/18/24 0439 12/19/24 0455  Weight: 115.4 kg 114.1 kg 113.6 kg    Examination: General: Elderly obese male, sitting in chair, Lungs: Decreased breath sounds bilateral, faint scattered rhonchi, no accessory muscle use S1-S2 regular, no murmur Soft obese abdomen Neuro: Hard of hearing..  Alert, interactive, nonfocal 1+ bipedal edema      Latest Ref Rng & Units 10/16/2024    3:27 PM  PFT Results  FVC-Pre L 2.85   FVC-Predicted Pre % 69   FVC-Post L 2.69   FVC-Predicted Post % 66   Pre  FEV1/FVC % % 56   Post FEV1/FCV % % 58   FEV1-Pre L 1.59   FEV1-Predicted Pre % 55   FEV1-Post L 1.57   DLCO uncorrected ml/min/mmHg 14.09   DLCO UNC% % 57   DLCO corrected ml/min/mmHg 14.09   DLCO COR %Predicted % 57   DLVA Predicted % 82     LABS    PULMONARY No results for input(s): PHART, PCO2ART, PO2ART, HCO3, TCO2, O2SAT in the last 168 hours.  Invalid input(s): PCO2, PO2  CBC Recent Labs  Lab 12/17/24 0216 12/18/24 0253 12/19/24 0359  HGB 14.9 15.7 15.4  HCT 42.2 45.3 44.3  WBC 18.9*  24.0* 23.1*  PLT 276 294 285    COAGULATION No results for input(s): INR in the last 168 hours.  CARDIAC  No results for input(s): TROPONINI in the last 168 hours. Recent Labs  Lab 12/14/24 0613 12/16/24 0909 12/17/24 0216 12/18/24 0253 12/19/24 0359  PROBNP 144.0 172.0 152.0 122.0 112.0     CHEMISTRY Recent Labs  Lab 12/14/24 9386 12/15/24 0346 12/16/24 0909 12/17/24 0216 12/18/24 0253 12/19/24 0359  NA 134* 133* 130* 132* 131* 130*  K 4.6 4.9 5.0 4.5 4.5 4.2  CL 97* 94* 93* 90* 91* 91*  CO2 26 30 26 29 30 30   GLUCOSE 192* 244* 252* 189* 155* 145*  BUN 30* 33* 39* 45* 45* 43*  CREATININE 0.89 0.97 0.92 1.10 1.09 1.02  CALCIUM  7.9* 8.4* 8.5* 8.4* 9.0 8.9  MG 2.6* 2.5*  --  2.6* 2.8* 2.8*  PHOS  --  3.5  --  4.1 4.3  --    Estimated Creatinine Clearance: 72.8 mL/min (by C-G formula based on SCr of 1.02 mg/dL).   LIVER Recent Labs  Lab 12/13/24 0334 12/16/24 0909 12/17/24 0216 12/18/24 0253 12/19/24 0359  AST 11* 13* 13* 14* 16  ALT 51* 40 40 43 45*  ALKPHOS 67 65 66 65 62  BILITOT 0.5 0.7 0.7 0.8 0.9  PROT 5.4* 5.5* 5.7* 5.9* 5.8*  ALBUMIN  3.1* 3.2* 3.3* 3.5 3.4*     INFECTIOUS Recent Labs  Lab 12/17/24 0216 12/18/24 0253 12/19/24 0359  PROCALCITON 0.10 0.12 0.17     ENDOCRINE CBG (last 3)  Recent Labs    12/17/24 0736 12/18/24 0821 12/19/24 0751  GLUCAP 171* 127* 123*   Imaging: - Chest x-ray with bibasilar infiltrates. CT chest June 2026 done during previous admission: Showed emphysematous changes without any PE. PFT 2025 moderate obstruction with moderate reduction in DLCO, no BD response Echo moderate LVH, EF normal, bubble study negative.   Labs show low procalcitonin, mild hyponatremia, stable leukocytosis   Resolved problem list   Assessment and Plan  BAseline COPD Gold stge 2: No home oxygen use or recent smoking Acute exacerbation of COPD  Acute hypoxemic respiratory failure: ?  Bibasilar pneumonia:  Severe  hypoxemia out of proportion to pulmonary infiltrates, no evidence of severe PAH or cor pulmonale, CT angiogram chest was -1/7, bubble study negative for shunt.  On prior hospitalization, similar hypoxia improved with treatment of COPD He seems to be improving with diuresis last few days and was able to transition to 8 L salter nasal cannula yesterday but hypoxia worse again today.  He does have some bronchospasm on exam today so likely this is airway related but we have to investigate for other etiologies  - Obtain CT angio chest -Continue daily diuresis - Will initiate BiPAP 12/8 nightly and see if this improves his hypoxia  -Budesonide , add  Brovana  and Yupelri , DC Atrovent  - With decreasing Solu-Medrol , his sugars are better controlled, leukocytosis still increasing  - Goal SpO2 88-92%   PCCM to follow  Harden Staff MD. FCCP. [place] [facility] Pager : [phone]  If no response to pager , please call [phone] until 7 pm After 7:00 pm call Elink  [phone]      12/19/2024, 5:07 PM  "

## 2024-12-19 NOTE — Plan of Care (Signed)
   Problem: Clinical Measurements: Goal: Diagnostic test results will improve Outcome: Progressing Goal: Respiratory complications will improve Outcome: Progressing

## 2024-12-19 NOTE — Progress Notes (Signed)
 [facility] Progress Note Patient: Brandon [name] FMW:[id number] DOB: 29-Jul-1941  DOA: 12/06/2024 DOS: the patient was seen and examined on 12/19/2024  Brief Summary: Patient with PMH of COPD, HTN, HLD, AAA, depression, obesity presents to the hospital with complaints of progressively worsening cough and shortness of breath as well as confusion.  Significant events: 1/7 - 1/14 admitted for acute hypoxic respiratory failure secondary to COPD exacerbation.  Was able to be weaned down to room air at the time of the discharge. 1/22 presented due to worsening shortness of breath especially when lying down.  Admitted to the hospitalist service.  PCCM was consulted.  Lactic acid elevated.  Being treated for acute hypoxic respiratory failure secondary to COPD and pneumonia. 1/23 echocardiogram EF 55 to 60%, no WMA, no valvular abnormality, RV normal. 1/24 echo bubble study negative for any shunt. 1/25 switch to oral antibiotics  Consults: PCCM    Subjective: Patient in bed, appears comfortable, denies any headache, no fever, no chest pain or pressure, no shortness of breath , no abdominal pain. No new focal weakness.   Assessment and plan:  Acute hypoxic respiratory failure. Secondary to COPD exacerbation as well as acute on chronic diastolic CHF. Community-acquired pneumonia. Patient was able to be weaned down to room air during his last admission when he was presented to the hospital with COPD exacerbation. Blood cultures negative.  MRSA PCR negative.  Finished antibiotic treatment, currently on steroid taper Procalcitonin is negative. Respiratory pathogen panel negative. Echocardiogram stable with EF 60%, no wall motion abnormality, RV pressures normal, no shunt on bubble study.   On steroid taper per pulmonary along with supplemental oxygen and nebulizer treatments, as needed Lasix , he has made excellent improvement after diuresis was initiated, PCCM following.  He was originally on 40  L heated flow oxygen, he is down to 8 L now.  Encouraged sitting in chair using I-S flutter valve for pulmonary toiletry, sat in chair for 6 hours on 12/12/2024, moderate improvement, oxygen demand has come down.  Encouraged to continue doing it.  Pulmonary following closely.  SpO2: 98 % O2 Flow Rate (L/min): 8 L/min FiO2 (%): (S) 30 %   Acute on chronic HFpEF.  EF 55 to 60% Being diuresed on a as needed basis, minimal crackles.  Monitor.  Dose Lasix  daily.  Oropharyngeal candidiasis. On Diflucan . Most likely due to his recent use of steroid. Most likely will worsen with ongoing use of antibiotics and steroids.  History of SDH as well as ICH. No focal deficit. Intermittent confusion noted by family in the setting of hypoxia.  HTN. On amlodipine . Given significant lower extremity edema would recommend to discontinue this medication upon discharge.  History of CAD. PAD AAA with repair. Currently no chest pain. Echocardiogram no wall motion abnormality. EKG nonischemic. Continue aspirin   Leukocytosis. This is mostly steroid-induced. For now we will monitor.  Lactic acidosis. Likely due to increased work of breathing.  Resolved.  LFT elevation. AST ALT ratio 1 is to 2.  AST normalized.  Hard of hearing. Noted.  Obesity Class 2 Body mass index is 36.77 kg/m.  Placing the pt at higher risk of poor outcomes.  Code Status: Full Code     DVT Prophylaxis: heparin  injection 5,000 Units Start: 12/06/24 1400 Place TED hose Start: 12/06/24 0813 SCDs Start: 12/06/24 0646     Physical exam. Vitals:   12/19/24 0254 12/19/24 0401 12/19/24 0455 12/19/24 0738  BP:      Pulse:      Resp: ROLLEN)  23     Temp:      TempSrc:  Axillary    SpO2:    98%  Weight:   113.6 kg   Height:       Awake Alert, No new F.N deficits, Normal affect [place].AT,PERRAL Supple Neck, No JVD,   Symmetrical Chest wall movement, moderate air movement bilaterally, no significant wheezing, mild but  positive crackles RRR,No Gallops, Rubs or new Murmurs,  +ve B.Sounds, Abd Soft, No tenderness,   No Cyanosis, Clubbing or edema   Family Communication: Discussed with patient's sister at bedside on 12/13/2024, [phone]: Myra (sister) called on 12/19/2024 at 7:47 AM - updated  Disposition Plan: Status is: Inpatient Remains inpatient appropriate because: Monitor for improvement in respiratory status.  Still on heated high flow.   Planned Discharge Destination: TBD.  SNF recommended by PT.  Diet: Diet Order             Diet Heart Fluid consistency: Thin  Diet effective now                    Data Review:   Patient Lines/Drains/Airways Status     Active Line/Drains/Airways     Name Placement date Placement time Site Days   Peripheral IV 12/12/24 20 G 1.88 Right;Anterior Forearm 12/12/24  2243  Forearm  7             Inpatient Medications  Scheduled Meds:  acidophilus  1 capsule Oral TID with meals   arformoterol   15 mcg Nebulization BID   aspirin   81 mg Oral Daily   benzonatate   100 mg Oral TID   budesonide  (PULMICORT ) nebulizer solution  0.5 mg Nebulization BID   citalopram   20 mg Oral Daily   dextromethorphan   30 mg Oral BID   fluconazole   200 mg Oral Daily   furosemide   40 mg Oral Daily   guaiFENesin   600 mg Oral BID   heparin   5,000 Units Subcutaneous Q8H   methylPREDNISolone  (SOLU-MEDROL ) injection  40 mg Intravenous Daily   pantoprazole   40 mg Oral Daily   revefenacin   175 mcg Nebulization Daily   senna-docusate  2 tablet Oral BID   sodium chloride  flush  3 mL Intravenous Q12H   Continuous Infusions: PRN Meds:.acetaminophen  **OR** acetaminophen , albuterol , bisacodyl , ondansetron  **OR** ondansetron  (ZOFRAN ) IV, oxyCODONE , traZODone   DVT Prophylaxis  heparin  injection 5,000 Units Start: 12/06/24 1400 Place TED hose Start: 12/06/24 0813 SCDs Start: 12/06/24 0646     Recent Labs  Lab 12/14/24 0613 12/15/24 0346 12/17/24 0216 12/18/24 0253  12/19/24 0359  WBC 14.5* 15.0* 18.9* 24.0* 23.1*  HGB 13.9 13.5 14.9 15.7 15.4  HCT 40.3 39.8 42.2 45.3 44.3  PLT 249 251 276 294 285  MCV 96.2 96.6 95.0 95.2 95.5  MCH 33.2 32.8 33.6 33.0 33.2  MCHC 34.5 33.9 35.3 34.7 34.8  RDW 12.6 12.7 12.7 12.7 12.9  LYMPHSABS 0.4* 0.4* 0.5* 0.7 0.7  MONOABS 0.4 0.4 0.7 1.3* 1.2*  EOSABS 0.0 0.0 0.0 0.0 0.0  BASOSABS 0.0 0.0 0.0 0.0 0.1    Recent Labs  Lab 12/13/24 0334 12/14/24 0613 12/15/24 0346 12/16/24 0909 12/17/24 0216 12/18/24 0253 12/19/24 0359  NA 132* 134* 133* 130* 132* 131* 130*  K 5.0 4.6 4.9 5.0 4.5 4.5 4.2  CL 97* 97* 94* 93* 90* 91* 91*  CO2 29 26 30 26 29 30 30   ANIONGAP 6 11 8 12 13 10 9   GLUCOSE 227* 192* 244* 252* 189* 155* 145*  BUN  31* 30* 33* 39* 45* 45* 43*  CREATININE 1.01 0.89 0.97 0.92 1.10 1.09 1.02  AST 11*  --   --  13* 13* 14* 16  ALT 51*  --   --  40 40 43 45*  ALKPHOS 67  --   --  65 66 65 62  BILITOT 0.5  --   --  0.7 0.7 0.8 0.9  ALBUMIN  3.1*  --   --  3.2* 3.3* 3.5 3.4*  CRP  --  <0.5  --   --  <0.5 <0.5 <0.5  PROCALCITON  --  <0.10  --   --  0.10 0.12 0.17  MG 2.9* 2.6* 2.5*  --  2.6* 2.8* 2.8*  PHOS  --   --  3.5  --  4.1 4.3  --   CALCIUM  8.5* 7.9* 8.4* 8.5* 8.4* 9.0 8.9      Recent Labs  Lab 12/14/24 0613 12/15/24 0346 12/16/24 0909 12/17/24 0216 12/18/24 0253 12/19/24 0359  CRP <0.5  --   --  <0.5 <0.5 <0.5  PROCALCITON <0.10  --   --  0.10 0.12 0.17  MG 2.6* 2.5*  --  2.6* 2.8* 2.8*  CALCIUM  7.9* 8.4* 8.5* 8.4* 9.0 8.9    ProBNP    Component Value Date/Time   PROBNP 112.0 12/19/2024 0359   PROBNP 338.7 (H) 09/21/2012 1455     ProBNP (last 3 results) Recent Labs    12/17/24 0216 12/18/24 0253 12/19/24 0359  PROBNP 152.0 122.0 112.0    --------------------------------------------------------------------------------------------------------------- Lab Results  Component Value Date   CHOL 141 02/03/2023   HDL 40 02/03/2023   LDLCALC 79 02/03/2023   TRIG 123  02/03/2023   CHOLHDL 4.7 12/05/2015    No results found for: HGBA1C No results for input(s): TSH, T4TOTAL, FREET4, T3FREE, THYROIDAB in the last 72 hours. No results for input(s): VITAMINB12, FOLATE, FERRITIN, TIBC, IRON, RETICCTPCT in the last 72 hours. ------------------------------------------------------------------------------------------------------------------ Cardiac Enzymes No results for input(s): CKMB, TROPONINI, MYOGLOBIN in the last 168 hours.  Invalid input(s): CK  Micro Results Recent Results (from the past 240 hours)  MRSA Next Gen by PCR, Nasal     Status: None   Collection Time: 12/12/24  5:59 AM   Specimen: Nasal Mucosa; Nasal Swab  Result Value Ref Range Status   MRSA by PCR Next Gen NOT DETECTED NOT DETECTED Final    Comment: (NOTE) The GeneXpert MRSA Assay (FDA approved for NASAL specimens only), is one component of a comprehensive MRSA colonization surveillance program. It is not intended to diagnose MRSA infection nor to guide or monitor treatment for MRSA infections. Test performance is not FDA approved in patients less than 22 years old. Performed at [facility], [address] [place], [place] [address]     Radiology Reports  DG Chest [place] 1 View Result Date: 12/19/2024 CLINICAL DATA:  Shortness of breath. EXAM: PORTABLE CHEST 1 VIEW COMPARISON:  12/16/2024 FINDINGS: The cardio pericardial silhouette is enlarged. Interval improvement in aeration at the left lung base. Vascular congestion persists without overt edema. Probable trace bilateral effusions. Telemetry leads overlie the chest. IMPRESSION: 1. Interval improvement in aeration at the left lung base. 2. Persistent vascular congestion without overt edema. Electronically Signed   By: Camellia Candle M.D.   On: 12/19/2024 06:27       Signature  -   Lavada Stank M.D on 12/19/2024 at 7:46 AM   -  To page go to [email or web address]

## 2024-12-19 NOTE — Progress Notes (Signed)
 RT called to room due to patient having an increased work of breathing.  Patient was on 10 L HFNC and pursed lip breathing upon RT X 2 arrival.  Increased HFNC to 15 L and patient was repositioned in bed with the help of RN at bedside due to complaint of back pain.  RN was going to address the back pain and if patient continues to have increased WOB RT will place patient back on the HHFNC.  Clear/ diminished breath sounds were auscultated bilaterally.  RT will continue to monitor.

## 2024-12-20 LAB — CBC WITH DIFFERENTIAL/PLATELET
Abs Immature Granulocytes: 0.57 10*3/uL — ABNORMAL HIGH (ref 0.00–0.07)
Basophils Absolute: 0.1 10*3/uL (ref 0.0–0.1)
Basophils Relative: 0 %
Eosinophils Absolute: 0 10*3/uL (ref 0.0–0.5)
Eosinophils Relative: 0 %
HCT: 44.1 % (ref 39.0–52.0)
Hemoglobin: 15.7 g/dL (ref 13.0–17.0)
Immature Granulocytes: 3 %
Lymphocytes Relative: 3 %
Lymphs Abs: 0.7 10*3/uL (ref 0.7–4.0)
MCH: 33.2 pg (ref 26.0–34.0)
MCHC: 35.6 g/dL (ref 30.0–36.0)
MCV: 93.2 fL (ref 80.0–100.0)
Monocytes Absolute: 1.2 10*3/uL — ABNORMAL HIGH (ref 0.1–1.0)
Monocytes Relative: 5 %
Neutro Abs: 20.1 10*3/uL — ABNORMAL HIGH (ref 1.7–7.7)
Neutrophils Relative %: 89 %
Platelets: 289 10*3/uL (ref 150–400)
RBC: 4.73 MIL/uL (ref 4.22–5.81)
RDW: 12.8 % (ref 11.5–15.5)
WBC: 22.6 10*3/uL — ABNORMAL HIGH (ref 4.0–10.5)
nRBC: 0 % (ref 0.0–0.2)

## 2024-12-20 LAB — BASIC METABOLIC PANEL WITH GFR
Anion gap: 13 (ref 5–15)
BUN: 41 mg/dL — ABNORMAL HIGH (ref 8–23)
CO2: 25 mmol/L (ref 22–32)
Calcium: 8.3 mg/dL — ABNORMAL LOW (ref 8.9–10.3)
Chloride: 91 mmol/L — ABNORMAL LOW (ref 98–111)
Creatinine, Ser: 1.06 mg/dL (ref 0.61–1.24)
GFR, Estimated: >60 mL/min
Glucose, Bld: 159 mg/dL — ABNORMAL HIGH (ref 70–99)
Potassium: 3.9 mmol/L (ref 3.5–5.1)
Sodium: 129 mmol/L — ABNORMAL LOW (ref 135–145)

## 2024-12-20 LAB — PHOSPHORUS: Phosphorus: 4.5 mg/dL (ref 2.5–4.6)

## 2024-12-20 LAB — GLUCOSE, CAPILLARY: Glucose-Capillary: 138 mg/dL — ABNORMAL HIGH (ref 70–99)

## 2024-12-20 LAB — MAGNESIUM: Magnesium: 2.5 mg/dL — ABNORMAL HIGH (ref 1.7–2.4)

## 2024-12-20 MED ORDER — FUROSEMIDE 10 MG/ML IJ SOLN
40.0000 mg | Freq: Once | INTRAMUSCULAR | Status: AC
  Administered 2024-12-20: 40 mg via INTRAVENOUS
  Filled 2024-12-20: qty 4

## 2024-12-20 NOTE — Progress Notes (Signed)
 Physical Therapy Treatment Patient Details Name: Brandon [name] MRN: [id number] DOB: June 21, 1941 Today's Date: 12/20/2024   History of Present Illness 84 y.o male presents to [facility] on 1/22 with sudden onset SOB. Currently on 30L [facility]. PMH includes: HTN, HLD, AAA s/p repair 2013, COPD, and PVD.    PT Comments  Pt presents with decreased O2 requirements, now on 5L via [place]. Pt demonstrates increased respiratory efforts at rest and with activity, limiting activity tolerance, but reports 3/10 difficulty on DOE scale. Hand placement reviewed with pt during STS due to reaching for walker prior to standing. Pt requires minA with poor hand placement and CGA to stand when pushing from arm rest. Pt tolerates short bouts of ambulation and requires 3-4 minute breaks between tasks. Resistance added to seated exercises and pt tolerates well. Pt would benefit from continued PT services focused on bed mobility, balance, transfers, and progressing gait.     If plan is discharge home, recommend the following: A little help with walking and/or transfers;A little help with bathing/dressing/bathroom;Assistance with cooking/housework;Assist for transportation;Help with stairs or ramp for entrance;Direct supervision/assist for medications management   Can travel by private vehicle     No (O2 requirements)  Equipment Recommendations  Rolling walker (2 wheels);BSC/3in1    Recommendations for Other Services       Precautions / Restrictions Precautions Precautions: Fall Recall of Precautions/Restrictions: Intact Precaution/Restrictions Comments: Monitor O2 Restrictions Weight Bearing Restrictions Per Provider Order: No     Mobility  Bed Mobility               General bed mobility comments: Pt seated in recliner upon arrival.    Transfers Overall transfer level: Needs assistance Equipment used: Rolling walker (2 wheels) Transfers: Sit to/from Stand Sit to Stand: Contact guard assist, Min assist            General transfer comment: Quickly becomes SOB with activity. Increased cues needed for hand placement this session. MinA when hands placed on walker, CGA with correct hand placement on arm rests. STS x4 completed throughout session.    Ambulation/Gait Ambulation/Gait assistance: Contact guard assist Gait Distance (Feet): 5 Feet (fwd/bkwd x3) Assistive device: Rolling walker (2 wheels) Gait Pattern/deviations: Step-through pattern, Decreased stride length, Trunk flexed, Narrow base of support   Gait velocity interpretation: <1.31 ft/sec, indicative of household ambulator   General Gait Details: Pt on 5L O2 this session and ambulation limited due to increased respiratory effort. Pt quick to become SOB and requires 3-4 minute rest breaks between activities. Gait mechanics unchanged but overall poor activity tolerance.   Stairs             Wheelchair Mobility     Tilt Bed    Modified Rankin (Stroke Patients Only)       Balance Overall balance assessment: Needs assistance Sitting-balance support: No upper extremity supported, Feet supported Sitting balance-Leahy Scale: Good Sitting balance - Comments: No sitting balance concerns. Sitting forward with trunk unsupported in recliner.   Standing balance support: Bilateral upper extremity supported, During functional activity, Reliant on assistive device for balance Standing balance-Leahy Scale: Poor Standing balance comment: Pt reliant on walker andCGA to maintain balance in standing. No overt LOB occurs during session.                            Communication Communication Communication: Impaired Factors Affecting Communication: Hearing impaired  Cognition Arousal: Alert Behavior During Therapy: [facility] for tasks assessed/performed  PT - Cognitive impairments: No apparent impairments                         Following commands: Intact      Cueing Cueing Techniques: Verbal cues, Tactile cues,  Visual cues  Exercises General Exercises - Lower Extremity Long Arc Quad: AROM, Both, 10 reps, Seated (with resistance) Hip Flexion/Marching: AROM, 10 reps, Seated, Both (with resistance) Heel Raises: AROM, Both, 10 reps, Seated Other Exercises Other Exercises: Shoulder rolls 1x6 Other Exercises: Scapular retractions 1x10    General Comments General comments (skin integrity, edema, etc.): HR 110s with ambulation. SpO2 92-100% on 5L. No new skin abnormalities noted.      Pertinent Vitals/Pain Pain Assessment Pain Assessment: No/denies pain    Home Living                          Prior Function            PT Goals (current goals can now be found in the care plan section) Acute Rehab PT Goals Patient Stated Goal: Get well, improve strength and endurance again PT Goal Formulation: With patient/family Time For Goal Achievement: 12/21/24 Progress towards PT goals: Progressing toward goals    Frequency    Min 2X/week      PT Plan      Co-evaluation              AM-PAC PT 6 Clicks Mobility   Outcome Measure  Help needed turning from your back to your side while in a flat bed without using bedrails?: A Little Help needed moving from lying on your back to sitting on the side of a flat bed without using bedrails?: A Little Help needed moving to and from a bed to a chair (including a wheelchair)?: A Little Help needed standing up from a chair using your arms (e.g., wheelchair or bedside chair)?: A Little Help needed to walk in hospital room?: A Little Help needed climbing 3-5 steps with a railing? : Total 6 Click Score: 16    End of Session Equipment Utilized During Treatment: Oxygen Activity Tolerance: Other (comment) (DOE) Patient left: in chair;with call bell/phone within reach;with family/visitor present Nurse Communication: Mobility status PT Visit Diagnosis: Unsteadiness on feet (R26.81);Muscle weakness (generalized) (M62.81)     Time:  8657-8586 PT Time Calculation (min) (ACUTE ONLY): 31 min  Charges:    $Gait Training: 8-22 mins $Therapeutic Exercise: 8-22 mins PT General Charges $$ ACUTE PT VISIT: 1 Visit                     Sabra Morel, PT, DPT  Acute Rehabilitation Services         Office: [phone]      Sabra MARLA Morel 12/20/2024, 3:13 PM
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
## 2024-12-20 NOTE — Progress Notes (Signed)
 [facility] Progress Note Patient: Brandon [name] FMW:[id number] DOB: [birth year]  DOA: 12/06/2024 DOS: the patient was seen and examined on 12/20/2024  Brief Summary: Patient with PMH of COPD, HTN, HLD, AAA, depression, obesity presents to the hospital with complaints of progressively worsening cough and shortness of breath as well as confusion.  Significant events: 1/7 - 1/14 admitted for acute hypoxic respiratory failure secondary to COPD exacerbation.  Was able to be weaned down to room air at the time of the discharge. 1/22 presented due to worsening shortness of breath especially when lying down.  Admitted to the hospitalist service.  PCCM was consulted.  Lactic acid elevated.  Being treated for acute hypoxic respiratory failure secondary to COPD and pneumonia. 1/23 echocardiogram EF 55 to 60%, no WMA, no valvular abnormality, RV normal. 1/24 echo bubble study negative for any shunt. 1/25 switch to oral antibiotics  Consults: PCCM    Subjective:   Could not tolerate the BiPAP overnight, he slipped on heated high flow nasal cannula.     Assessment and plan:  Acute hypoxic respiratory failure. COPD exacerbation  acute on chronic diastolic CHF. Community-acquired pneumonia. Patient was able to be weaned down to room air during his last admission when he was presented to the hospital with COPD exacerbation. Blood cultures negative.  MRSA PCR negative.  Finished antibiotic treatment, currently on steroid taper Procalcitonin is negative. Respiratory pathogen panel negative. Echocardiogram stable with EF 60%, no wall motion abnormality, RV pressures normal, no shunt on bubble study. - CTA chest overnight with worsening pneumonia, but otherwise no concerning findings - Improved with IV diuresis, continue with diuresis with close monitoring of blood pressure and renal function - On steroid per renal, desonide, Brovana  and Yupelri  - Treated for 7 days of pneumonia, repeat imaging  overnight as reviewed by pulmonary, no concern for worsening pneumonia, finding most likely to atelectasis. - Wean oxygen as tolerated. - Could not tolerate overnight, so we will discontinue as discussed with pulmonary as discussed with pulmonary, will discontinue  SpO2: 99 % O2 Flow Rate (L/min): (S) 5 L/min FiO2 (%): 40 %   Acute on chronic HFpEF.  EF 55 to 60% Being diuresed on a as needed basis, minimal crackles.  Monitor.  Dose Lasix  daily.  Oropharyngeal candidiasis. On Diflucan . Most likely due to his recent use of steroid. Most likely will worsen with ongoing use of antibiotics and steroids.  History of SDH as well as ICH. No focal deficit. Intermittent confusion noted by family in the setting of hypoxia.  HTN. On amlodipine . Given significant lower extremity edema would recommend to discontinue this medication upon discharge.  History of CAD. PAD AAA with repair. Currently no chest pain. Echocardiogram no wall motion abnormality. EKG nonischemic. Continue aspirin   Leukocytosis. This is mostly steroid-induced. For now we will monitor.  Lactic acidosis. Likely due to increased work of breathing.  Resolved.  LFT elevation. AST ALT ratio 1 is to 2.  AST normalized.  Hard of hearing. Noted.  Obesity Class 2 Body mass index is 36.77 kg/m.  Placing the pt at higher risk of poor outcomes.  Code Status: Full Code     DVT Prophylaxis: heparin  injection 5,000 Units Start: 12/06/24 1400 Place TED hose Start: 12/06/24 0813 SCDs Start: 12/06/24 0646     Physical exam. Vitals:   12/20/24 0656 12/20/24 0734 12/20/24 0800 12/20/24 1200  BP:   (!) 117/56 111/85  Pulse:   75 87  Resp:    (!) 24  Temp:  97.6 F (36.4 C) (!) 97.2 F (36.2 C)  TempSrc:   Oral Oral  SpO2: 97% 97% 94% 99%  Weight:      Height:       Awake Alert, Oriented X 3, No new F.N deficits, Normal affect Diminished air entry at the bases, faint rhonchi, but no respiratory distress if  use of accessory muscles RRR,No Gallops,Rubs or new Murmurs, No Parasternal Heave +ve B.Sounds, Abd Soft, No tenderness, obese  no Cyanosis, Clubbing, +1 edema   Family Communication: None at bedside Disposition Plan: Status is: Inpatient Remains inpatient appropriate because: Monitor for improvement in respiratory status.  Still on heated high flow.   Planned Discharge Destination: TBD.  SNF recommended by PT.  Diet: Diet Order             Diet Heart Fluid consistency: Thin  Diet effective now                    Data Review:   Patient Lines/Drains/Airways Status     Active Line/Drains/Airways     Name Placement date Placement time Site Days   Peripheral IV 12/12/24 20 G 1.88 Right;Anterior Forearm 12/12/24  2243  Forearm  8   Peripheral IV 12/19/24 20 G 1.88 Anterior;Left Forearm 12/19/24  1829  Forearm  1             Inpatient Medications  Scheduled Meds:  acidophilus  1 capsule Oral TID with meals   arformoterol   15 mcg Nebulization BID   aspirin   81 mg Oral Daily   benzonatate   100 mg Oral TID   budesonide  (PULMICORT ) nebulizer solution  0.5 mg Nebulization BID   citalopram   20 mg Oral Daily   dextromethorphan   30 mg Oral BID   guaiFENesin   600 mg Oral BID   heparin   5,000 Units Subcutaneous Q8H   methylPREDNISolone  (SOLU-MEDROL ) injection  40 mg Intravenous Daily   pantoprazole   40 mg Oral Daily   revefenacin   175 mcg Nebulization Daily   senna-docusate  2 tablet Oral BID   sodium chloride  flush  3 mL Intravenous Q12H   Continuous Infusions: PRN Meds:.acetaminophen  **OR** acetaminophen , albuterol , bisacodyl , ondansetron  **OR** ondansetron  (ZOFRAN ) IV, oxyCODONE , traZODone   DVT Prophylaxis  heparin  injection 5,000 Units Start: 12/06/24 1400 Place TED hose Start: 12/06/24 0813 SCDs Start: 12/06/24 0646     Recent Labs  Lab 12/15/24 0346 12/17/24 0216 12/18/24 0253 12/19/24 0359 12/20/24 0331  WBC 15.0* 18.9* 24.0* 23.1* 22.6*  HGB  13.5 14.9 15.7 15.4 15.7  HCT 39.8 42.2 45.3 44.3 44.1  PLT 251 276 294 285 289  MCV 96.6 95.0 95.2 95.5 93.2  MCH 32.8 33.6 33.0 33.2 33.2  MCHC 33.9 35.3 34.7 34.8 35.6  RDW 12.7 12.7 12.7 12.9 12.8  LYMPHSABS 0.4* 0.5* 0.7 0.7 0.7  MONOABS 0.4 0.7 1.3* 1.2* 1.2*  EOSABS 0.0 0.0 0.0 0.0 0.0  BASOSABS 0.0 0.0 0.0 0.1 0.1    Recent Labs  Lab 12/14/24 0613 12/15/24 0346 12/16/24 0909 12/17/24 0216 12/18/24 0253 12/19/24 0359 12/20/24 0331  NA 134* 133* 130* 132* 131* 130* 129*  K 4.6 4.9 5.0 4.5 4.5 4.2 3.9  CL 97* 94* 93* 90* 91* 91* 91*  CO2 26 30 26 29 30 30 25   ANIONGAP 11 8 12 13 10 9 13   GLUCOSE 192* 244* 252* 189* 155* 145* 159*  BUN 30* 33* 39* 45* 45* 43* 41*  CREATININE 0.89 0.97 0.92 1.10 1.09 1.02 1.06  AST  --   --  13* 13* 14* 16  --   ALT  --   --  40 40 43 45*  --   ALKPHOS  --   --  65 66 65 62  --   BILITOT  --   --  0.7 0.7 0.8 0.9  --   ALBUMIN   --   --  3.2* 3.3* 3.5 3.4*  --   CRP <0.5  --   --  <0.5 <0.5 <0.5  --   PROCALCITON <0.10  --   --  0.10 0.12 0.17  --   MG 2.6* 2.5*  --  2.6* 2.8* 2.8* 2.5*  PHOS  --  3.5  --  4.1 4.3  --  4.5  CALCIUM  7.9* 8.4* 8.5* 8.4* 9.0 8.9 8.3*      Recent Labs  Lab 12/14/24 0613 12/15/24 0346 12/16/24 0909 12/17/24 0216 12/18/24 0253 12/19/24 0359 12/20/24 0331  CRP <0.5  --   --  <0.5 <0.5 <0.5  --   PROCALCITON <0.10  --   --  0.10 0.12 0.17  --   MG 2.6* 2.5*  --  2.6* 2.8* 2.8* 2.5*  CALCIUM  7.9* 8.4* 8.5* 8.4* 9.0 8.9 8.3*    ProBNP    Component Value Date/Time   PROBNP 112.0 12/19/2024 0359   PROBNP 338.7 (H) 09/21/2012 1455     ProBNP (last 3 results) Recent Labs    12/17/24 0216 12/18/24 0253 12/19/24 0359  PROBNP 152.0 122.0 112.0    --------------------------------------------------------------------------------------------------------------- Lab Results  Component Value Date   CHOL 141 02/03/2023   HDL 40 02/03/2023   LDLCALC 79 02/03/2023   TRIG 123 02/03/2023   CHOLHDL  4.7 12/05/2015    No results found for: HGBA1C No results for input(s): TSH, T4TOTAL, FREET4, T3FREE, THYROIDAB in the last 72 hours. No results for input(s): VITAMINB12, FOLATE, FERRITIN, TIBC, IRON, RETICCTPCT in the last 72 hours. ------------------------------------------------------------------------------------------------------------------ Cardiac Enzymes No results for input(s): CKMB, TROPONINI, MYOGLOBIN in the last 168 hours.  Invalid input(s): CK  Micro Results Recent Results (from the past 240 hours)  MRSA Next Gen by PCR, Nasal     Status: None   Collection Time: 12/12/24  5:59 AM   Specimen: Nasal Mucosa; Nasal Swab  Result Value Ref Range Status   MRSA by PCR Next Gen NOT DETECTED NOT DETECTED Final    Comment: (NOTE) The GeneXpert MRSA Assay (FDA approved for NASAL specimens only), is one component of a comprehensive MRSA colonization surveillance program. It is not intended to diagnose MRSA infection nor to guide or monitor treatment for MRSA infections. Test performance is not FDA approved in patients less than 66 years old. Performed at [facility], [address], [place], [place] [address]     Radiology Reports  CT Angio Chest Pulmonary Embolism (PE) W or WO Contrast Result Date: 12/19/2024 EXAM: CTA of the Chest with contrast for PE 12/19/2024 11:45:28 PM TECHNIQUE: CTA of the chest was performed without and with the administration of 75 mL iohexol  (OMNIPAQUE ) 350 MG/ML injection. Multiplanar reformatted images are provided for review. MIP images are provided for review. Automated exposure control, iterative reconstruction, and/or weight based adjustment of the mA/kV was utilized to reduce the radiation dose to as low as reasonably achievable. COMPARISON: CT angiogram chest 11/21/2024. CLINICAL HISTORY: Pulmonary embolism (PE) suspected, high probability. FINDINGS: PULMONARY ARTERIES: Pulmonary arteries are adequately  opacified for evaluation. No pulmonary embolism. Main pulmonary artery is normal in caliber. MEDIASTINUM: The heart is mildly enlarged. Extensive atherosclerotic  calcifications are seen throughout the aorta and coronary arteries similar to the prior study. LYMPH NODES: No mediastinal, hilar or axillary lymphadenopathy. LUNGS AND PLEURA: Moderate emphysema present. There is bilateral lower lobe airspace consolidation with additional patchy airspace opacities in the bilateral upper lobes, right greater than left. There is a calcified granuloma in the right upper lobe. No pleural effusion or pneumothorax. UPPER ABDOMEN: There is a small hiatal hernia. SOFT TISSUES AND BONES: No acute bone or soft tissue abnormality. IMPRESSION: 1. No evidence of pulmonary embolism. 2. Bilateral lower lobe airspace consolidation with additional patchy airspace opacities in the bilateral upper lobes, right greater than left. Findings are compatible with multifocal pneumonia. 3. Moderate emphysema. Electronically signed by: Greig Pique MD 12/19/2024 11:56 PM EST RP Workstation: [id number]   DG Chest Port 1 View Result Date: 12/19/2024 CLINICAL DATA:  Shortness of breath. EXAM: PORTABLE CHEST 1 VIEW COMPARISON:  12/16/2024 FINDINGS: The cardio pericardial silhouette is enlarged. Interval improvement in aeration at the left lung base. Vascular congestion persists without overt edema. Probable trace bilateral effusions. Telemetry leads overlie the chest. IMPRESSION: 1. Interval improvement in aeration at the left lung base. 2. Persistent vascular congestion without overt edema. Electronically Signed   By: Camellia Candle M.D.   On: 12/19/2024 06:27       Signature  -   Brayton Lye M.D on 12/20/2024 at 2:51 PM   -  To page go to [email or web address]

## 2024-12-20 NOTE — Progress Notes (Signed)
 "  NAME:  Brandon [name], MRN:  [id number], DOB:  1941/02/02, LOS: 14 ADMISSION DATE:  12/06/2024, CONSULTATION DATE:  12/20/24 REFERRING MD:  Dr. Marcene, CHIEF COMPLAINT:  COPD exacerbation   History of Present Illness:   Brandon [name] is an 84 y/o M with a PMH significant for HTN, HLD, AAA w/ repair, Grade II COPD (FEV1 1.59 L, 55% predicted) who presents with shortness of breath, cough and acute hypoxic respiratory failure. Pulmonology consulted for evaluation and management of the latter.  He has a history of COPD, grade two, with multiple admissions for exacerbations. Shortness of breath started suddenly this morning around 1:30 AM. A similar episode occurred two weeks ago, but he thought he was improving until the current episode.  He denies using home oxygen and was not discharged with oxygen after previous hospitalizations. He experiences shortness of breath sometimes when lying flat and has noticed swelling in his feet and legs, which is worse than usual. No fever, chills, night sweats, or coughing up sputum or blood.  He uses an inhaler, Breztri , two puffs in the morning and two puffs in the evening, and rinses his mouth after use. He also has a nebulizer at home.  He quit smoking cigarettes 13 years ago. Pertinent  Medical History   Past Medical History:  Diagnosis Date   AAA (abdominal aortic aneurysm)    COPD (chronic obstructive pulmonary disease) (HCC)    DIC (disseminated intravascular coagulation) 09/09/2012   Hyperlipidemia    Hypertension    Leg wound, left 12/12/2013   Peripheral vascular disease    Respiratory failure, post-operative 09/09/2012   Shortness of breath    Significant Hospital Events: Including procedures, antibiotic start and stop dates in addition to other pertinent events   1/7 - 1/14 admitted for acute hypoxic respiratory failure secondary to COPD exacerbation.  Was able to be weaned down to room air at the time of the discharge. 1/22 presented  due to worsening shortness of breath especially when lying down.  Admitted to the hospitalist service.  PCCM was consulted.  Lactic acid elevated. 1/23 echocardiogram EF 55 to 60%, no WMA, no valvular abnormality, RV normal. 2/3 transition from [facility] to 8 L [place] 2/4 placed back on HHFNC , CTA neg PE Interim History / Subjective:   Did not tolerate BiPAP overnight. But was able to transition from [facility] back to nasal cannula. Diuresed 2.4 L   Objective    Blood pressure 111/85, pulse 87, temperature (!) 97.2 F (36.2 C), temperature source Oral, resp. rate (!) 24, height 5' 11 (1.803 m), weight 108.4 kg, SpO2 99%.    FiO2 (%):  [40 %] 40 %   Intake/Output Summary (Last 24 hours) at 12/20/2024 1252 Last data filed at 12/19/2024 2157 Gross per 24 hour  Intake --  Output 2075 ml  Net -2075 ml   Filed Weights   12/18/24 0439 12/19/24 0455 12/20/24 0500  Weight: 114.1 kg 113.6 kg 108.4 kg    Examination: General: Elderly obese male, sitting in bed, no distress Lungs: No accessory muscle use, decreased breath sounds bilateral, faint rhonchi S1-S2 regular, no murmur Soft obese abdomen Very Hard of hearing. Alert, interactive, nonfocal 1+ bipedal edema decreased      Latest Ref Rng & Units 10/16/2024    3:27 PM  PFT Results  FVC-Pre L 2.85   FVC-Predicted Pre % 69   FVC-Post L 2.69   FVC-Predicted Post % 66   Pre FEV1/FVC % % 56   Post  FEV1/FCV % % 58   FEV1-Pre L 1.59   FEV1-Predicted Pre % 55   FEV1-Post L 1.57   DLCO uncorrected ml/min/mmHg 14.09   DLCO UNC% % 57   DLCO corrected ml/min/mmHg 14.09   DLCO COR %Predicted % 57   DLVA Predicted % 82     LABS    PULMONARY No results for input(s): PHART, PCO2ART, PO2ART, HCO3, TCO2, O2SAT in the last 168 hours.  Invalid input(s): PCO2, PO2  CBC Recent Labs  Lab 12/18/24 0253 12/19/24 0359 12/20/24 0331  HGB 15.7 15.4 15.7  HCT 45.3 44.3 44.1  WBC 24.0* 23.1* 22.6*  PLT 294 285 289     COAGULATION No results for input(s): INR in the last 168 hours.  CARDIAC  No results for input(s): TROPONINI in the last 168 hours. Recent Labs  Lab 12/14/24 0613 12/16/24 0909 12/17/24 0216 12/18/24 0253 12/19/24 0359  PROBNP 144.0 172.0 152.0 122.0 112.0     CHEMISTRY Recent Labs  Lab 12/15/24 0346 12/16/24 0909 12/17/24 0216 12/18/24 0253 12/19/24 0359 12/20/24 0331  NA 133* 130* 132* 131* 130* 129*  K 4.9 5.0 4.5 4.5 4.2 3.9  CL 94* 93* 90* 91* 91* 91*  CO2 30 26 29 30 30 25   GLUCOSE 244* 252* 189* 155* 145* 159*  BUN 33* 39* 45* 45* 43* 41*  CREATININE 0.97 0.92 1.10 1.09 1.02 1.06  CALCIUM  8.4* 8.5* 8.4* 9.0 8.9 8.3*  MG 2.5*  --  2.6* 2.8* 2.8* 2.5*  PHOS 3.5  --  4.1 4.3  --  4.5   Estimated Creatinine Clearance: 68.4 mL/min (by C-G formula based on SCr of 1.06 mg/dL).   LIVER Recent Labs  Lab 12/16/24 0909 12/17/24 0216 12/18/24 0253 12/19/24 0359  AST 13* 13* 14* 16  ALT 40 40 43 45*  ALKPHOS 65 66 65 62  BILITOT 0.7 0.7 0.8 0.9  PROT 5.5* 5.7* 5.9* 5.8*  ALBUMIN  3.2* 3.3* 3.5 3.4*     INFECTIOUS Recent Labs  Lab 12/17/24 0216 12/18/24 0253 12/19/24 0359  PROCALCITON 0.10 0.12 0.17     ENDOCRINE CBG (last 3)  Recent Labs    12/18/24 0821 12/19/24 0751 12/20/24 0758  GLUCAP 127* 123* 138*   Imaging: - Chest x-ray with bibasilar infiltrates. CT chest June 2026 done during previous admission: Showed emphysematous changes without any PE. PFT 2025 moderate obstruction with moderate reduction in DLCO, no BD response Echo moderate LVH, EF normal, bubble study negative.   Labs show sodium drifting down 129, stable renal function, stable leukocytosis   Resolved problem list   Assessment and Plan  BAseline COPD Gold stge 2: No home oxygen use or recent smoking Acute exacerbation of COPD  Acute hypoxemic respiratory failure: ?  Bibasilar pneumonia:  Severe hypoxemia out of proportion to pulmonary infiltrates, no  evidence of severe PAH or cor pulmonale, CT angiogram chest was -1/7, bubble study negative for shunt.  On prior hospitalization, similar hypoxia improved with treatment of COPD Repeat CTA shows bibasal patchy consolidation versus atelectasis and right upper lobe mild patchy infiltrate  -Doubt this indicates a new HAP , since clinically his hypoxia is improved and procalcitonin is negative.  He has a persistent leukocytosis which is likely steroid related  -Budesonide , add Brovana  and Yupelri , DC Atrovent  - Continue Solu-Medrol  40 daily  - Goal SpO2 88-92% , hypoxia has improved considerably so will defer right heart cath - Diuresis intermittently and treat constipation to avoid abdominal distention further compromising lung function. He has  not tolerated BiPAP so will not pursue anymore. -If worsens clinically, would consider adding antibiotics again   PCCM to follow  Harden Staff MD. FCCP. [facility] Pager : [phone]  If no response to pager , please call [phone] until 7 pm After 7:00 pm call Elink  [phone]      12/20/2024, 12:52 PM  "

## 2024-12-20 NOTE — TOC Progression Note (Addendum)
 Transition of Care [facility]) - Progression Note    Patient Details  Name: CARMIN DIBARTOLO MRN: [id number] Date of Birth: [birth year]  Transition of Care [facility]) CM/SW Contact  [name], RN Phone Number: 12/20/2024, 8:38 AM  Clinical Narrative:     CM spoke with patient this morning regarding SNF vs HH, after concerns presented by family that patient is not strong enough to go home. After discussion, patient is in agreement for STR before home at discharge.   Patient was at [facility] in 2023, prefers to go to this facility again.   ICM will continue to follow.  30: family spoke to CM again and reporting [name] as first choice, then [facility].  Expected Discharge Plan:  (TBD) Barriers to Discharge: Continued Medical Work up               Expected Discharge Plan and Services       Living arrangements for the past 2 months: Single Family Home                                       Social Drivers of Health (SDOH) Interventions SDOH Screenings   Food Insecurity: No Food Insecurity (12/06/2024)  Housing: Low Risk (12/06/2024)  Transportation Needs: No Transportation Needs (12/06/2024)  Utilities: Not At Risk (12/06/2024)  Depression (PHQ2-9): Low Risk (09/22/2022)  Social Connections: Moderately Isolated (12/06/2024)  Tobacco Use: Medium Risk (12/06/2024)    Readmission Risk Interventions    11/22/2024    2:11 PM  Readmission Risk Prevention Plan  Post Dischage Appt Complete  Medication Screening Complete  Transportation Screening Complete

## 2024-12-20 NOTE — Progress Notes (Signed)
 Patient was taken off HHFNC and placed on 11L [place] to see if he was able to tolerate laying down flat before his CT trip. Patient laid supine for 10 minutes without any respiratory distress while maintaining a O2 saturation of 97 to 100%. The oxygen flow rate was also titrated down to 2 L during that time period. RN was at bedside during this trial.

## 2024-12-20 NOTE — Plan of Care (Signed)
?  Problem: Clinical Measurements: ?Goal: Will remain free from infection ?Outcome: Progressing ?Goal: Diagnostic test results will improve ?Outcome: Progressing ?Goal: Respiratory complications will improve ?Outcome: Progressing ?  ?

## 2024-12-20 NOTE — Progress Notes (Signed)
" °   12/20/24 0016  Therapy Vitals  Resp (!) 25  Patient Position (if appropriate) Lying  Oxygen Therapy/Pulse Ox  O2 Device Nasal Cannula (pt unable to tolerate BIPAP, Pt switch to [place])  O2 Therapy Oxygen humidified  O2 Flow Rate (L/min) 3 L/min    "

## 2024-12-21 ENCOUNTER — Inpatient Hospital Stay (HOSPITAL_COMMUNITY)

## 2024-12-21 LAB — CBC
HCT: 43.7 % (ref 39.0–52.0)
Hemoglobin: 15.6 g/dL (ref 13.0–17.0)
MCH: 33.8 pg (ref 26.0–34.0)
MCHC: 35.7 g/dL (ref 30.0–36.0)
MCV: 94.8 fL (ref 80.0–100.0)
Platelets: 236 10*3/uL (ref 150–400)
RBC: 4.61 MIL/uL (ref 4.22–5.81)
RDW: 13.1 % (ref 11.5–15.5)
WBC: 23.5 10*3/uL — ABNORMAL HIGH (ref 4.0–10.5)
nRBC: 0 % (ref 0.0–0.2)

## 2024-12-21 LAB — BASIC METABOLIC PANEL WITH GFR
Anion gap: 11 (ref 5–15)
BUN: 40 mg/dL — ABNORMAL HIGH (ref 8–23)
CO2: 26 mmol/L (ref 22–32)
Calcium: 8.5 mg/dL — ABNORMAL LOW (ref 8.9–10.3)
Chloride: 91 mmol/L — ABNORMAL LOW (ref 98–111)
Creatinine, Ser: 1.12 mg/dL (ref 0.61–1.24)
GFR, Estimated: >60 mL/min
Glucose, Bld: 133 mg/dL — ABNORMAL HIGH (ref 70–99)
Potassium: 3.9 mmol/L (ref 3.5–5.1)
Sodium: 127 mmol/L — ABNORMAL LOW (ref 135–145)

## 2024-12-21 LAB — GLUCOSE, CAPILLARY: Glucose-Capillary: 141 mg/dL — ABNORMAL HIGH (ref 70–99)

## 2024-12-21 MED ORDER — PREDNISONE 20 MG PO TABS: 20.0000 mg | ORAL_TABLET | Freq: Every day | ORAL | Status: AC

## 2024-12-21 MED ORDER — FUROSEMIDE 10 MG/ML IJ SOLN
40.0000 mg | Freq: Once | INTRAMUSCULAR | Status: AC
  Administered 2024-12-21: 40 mg via INTRAVENOUS
  Filled 2024-12-21: qty 4

## 2024-12-21 MED ORDER — POLYETHYLENE GLYCOL 3350 17 G PO PACK
17.0000 g | PACK | Freq: Two times a day (BID) | ORAL | Status: AC
  Administered 2024-12-21 (×2): 17 g via ORAL
  Filled 2024-12-21 (×2): qty 1

## 2024-12-21 MED ORDER — BISACODYL 5 MG PO TBEC
10.0000 mg | DELAYED_RELEASE_TABLET | Freq: Once | ORAL | Status: AC
  Administered 2024-12-21: 10 mg via ORAL
  Filled 2024-12-21: qty 2

## 2024-12-21 MED ORDER — FUROSEMIDE 10 MG/ML IJ SOLN
60.0000 mg | Freq: Once | INTRAMUSCULAR | Status: AC
  Administered 2024-12-21: 60 mg via INTRAVENOUS
  Filled 2024-12-21: qty 6

## 2024-12-21 MED ORDER — PREDNISONE 5 MG PO TABS: 10.0000 mg | ORAL_TABLET | Freq: Every day | ORAL | Status: AC

## 2024-12-21 MED ORDER — PREDNISONE 5 MG PO TABS: 30.0000 mg | ORAL_TABLET | Freq: Every day | ORAL | Status: AC

## 2024-12-21 NOTE — TOC Progression Note (Signed)
 Transition of Care [facility]) - Progression Note    Patient Details  Name: DEVION CHRISCOE MRN: [id number] Date of Birth: August 05, 1941  [facility]) CM/SW Contact  [name], LCSW Phone Number: 12/21/2024, 3:40 PM  Clinical Narrative:    CSW obtained insurance approval for [name] on Monday if medically stable, Ref# [id number], effective 12/22/2024-12/25/2024.   Expected Discharge Plan: SNF Barriers to Discharge: Continued Medical Work up               Expected Discharge Plan and Services       Living arrangements for the past 2 months: Single Family Home                                       Social Drivers of Health (SDOH) Interventions SDOH Screenings   Food Insecurity: No Food Insecurity (12/06/2024)  Housing: Low Risk (12/06/2024)  Transportation Needs: No Transportation Needs (12/06/2024)  Utilities: Not At Risk (12/06/2024)  Depression (PHQ2-9): Low Risk (09/22/2022)  Social Connections: Moderately Isolated (12/06/2024)  Tobacco Use: Medium Risk (12/06/2024)    Readmission Risk Interventions    11/22/2024    2:11 PM  Readmission Risk Prevention Plan  Post Dischage Appt Complete  Medication Screening Complete  Transportation Screening Complete

## 2024-12-21 NOTE — Progress Notes (Signed)
 [facility] Progress Note Patient: Brandon [name] FMW:[id number] DOB: Dec 27, 1940  DOA: 12/06/2024 DOS: the patient was seen and examined on 12/21/2024  Brief Summary: Patient with PMH of COPD, HTN, HLD, AAA, depression, obesity presents to the hospital with complaints of progressively worsening cough and shortness of breath as well as confusion.  Significant events: 1/7 - 1/14 admitted for acute hypoxic respiratory failure secondary to COPD exacerbation.  Was able to be weaned down to room air at the time of the discharge. 1/22 presented due to worsening shortness of breath especially when lying down.  Admitted to the hospitalist service.  PCCM was consulted.  Lactic acid elevated.  Being treated for acute hypoxic respiratory failure secondary to COPD and pneumonia. 1/23 echocardiogram EF 55 to 60%, no WMA, no valvular abnormality, RV normal. 1/24 echo bubble study negative for any shunt. 1/25 switch to oral antibiotics  Consults: PCCM    Subjective:   Reports he is feeling better today, denies any abdominal pain, nausea or vomiting, report dyspnea has improved   Assessment and plan:  Acute hypoxic respiratory failure. COPD exacerbation  acute on chronic diastolic CHF. Community-acquired pneumonia. Patient was able to be weaned down to room air during his last admission when he was presented to the hospital with COPD exacerbation. Blood cultures negative.  MRSA PCR negative.  Finished antibiotic treatment, currently on steroid taper Procalcitonin is negative. Respiratory pathogen panel negative. Echocardiogram stable with EF 60%, no wall motion abnormality, RV pressures normal, no shunt on bubble study. - CTA chest overnight with worsening pneumonia, but otherwise no concerning findings - Improved with IV diuresis, continue with diuresis with close monitoring of blood pressure and renal function - On steroid per renal, desonide, Brovana  and Yupelri  - Treated for 7 days of  pneumonia, repeat imaging overnight as reviewed by pulmonary, no concern for worsening pneumonia, finding most likely to atelectasis. - Wean oxygen as tolerated. - Could not  tolerate BIPAP at nighttime, so it was discontinued as discussed with pulmonary . - He remains with fluctuating oxygen requirement, continue to wean as tolerated . - Improving with IV diuresis, will give 60 mg of IV Lasix  today .  SpO2: 100 % O2 Flow Rate (L/min): 5 L/min FiO2 (%): 40 %   Acute on chronic HFpEF.  EF 55 to 60% Being diuresed on a as needed basis, minimal crackles.  Monitor.  Dose Lasix  daily.  Give 60 mg of IV Lasix  today given stable renal function and blood pressure, but I have discussed with staff to limit his free water intake to 1200 cc given worsening hyponatremia.  Hyponatremia - Will limit free water intake  Oropharyngeal candidiasis. On Diflucan . Most likely due to his recent use of steroid. Most likely will worsen with ongoing use of antibiotics and steroids.  History of SDH as well as ICH. No focal deficit. Intermittent confusion noted by family in the setting of hypoxia.  HTN. On amlodipine . Given significant lower extremity edema would recommend to discontinue this medication upon discharge.  History of CAD. PAD AAA with repair. Currently no chest pain. Echocardiogram no wall motion abnormality. EKG nonischemic. Continue aspirin   Leukocytosis. This is mostly steroid-induced. For now we will monitor.  Lactic acidosis. Likely due to increased work of breathing.  Resolved.  LFT elevation. AST ALT ratio 1 is to 2.  AST normalized.  Hard of hearing. Noted.  Obesity Class 2 Body mass index is 36.77 kg/m.  Placing the pt at higher risk of poor outcomes.  Code  Status: Full Code     DVT Prophylaxis: heparin  injection 5,000 Units Start: 12/06/24 1400 Place TED hose Start: 12/06/24 0813 SCDs Start: 12/06/24 0646     Physical exam. Vitals:   12/20/24 2200  12/21/24 0000 12/21/24 0200 12/21/24 0400  BP:  109/74  133/68  Pulse: 85 76 85 64  Resp: (!) 30     Temp:  (!) 97.4 F (36.3 C)    TempSrc:  Oral  Oral  SpO2: 96% 98% 100% 100%  Weight:      Height:       Awake Alert, Oriented X 3, No new F.N deficits, Normal affect Improved air entry today, no wheezing or rhonchi RRR,No Gallops,Rubs or new Murmurs, No Parasternal Heave +ve B.Sounds, abdomen is obese, but soft, nontender no Cyanosis, Clubbing, trace   Family Communication: None at bedside Disposition Plan: Status is: Inpatient Remains inpatient appropriate because: Monitor for improvement in respiratory status.  Still on heated high flow.   Planned Discharge Destination: TBD.  SNF recommended by PT.  Diet: Diet Order             Diet Heart Fluid consistency: Thin  Diet effective now                    Data Review:   Patient Lines/Drains/Airways Status     Active Line/Drains/Airways     Name Placement date Placement time Site Days   Peripheral IV 12/12/24 20 G 1.88 Right;Anterior Forearm 12/12/24  2243  Forearm  9   Peripheral IV 12/19/24 20 G 1.88 Anterior;Left Forearm 12/19/24  1829  Forearm  2             Inpatient Medications  Scheduled Meds:  acidophilus  1 capsule Oral TID with meals   arformoterol   15 mcg Nebulization BID   aspirin   81 mg Oral Daily   benzonatate   100 mg Oral TID   bisacodyl   10 mg Oral Once   budesonide  (PULMICORT ) nebulizer solution  0.5 mg Nebulization BID   citalopram   20 mg Oral Daily   dextromethorphan   30 mg Oral BID   furosemide   40 mg Intravenous Once   guaiFENesin   600 mg Oral BID   heparin   5,000 Units Subcutaneous Q8H   methylPREDNISolone  (SOLU-MEDROL ) injection  40 mg Intravenous Daily   pantoprazole   40 mg Oral Daily   polyethylene glycol  17 g Oral BID   revefenacin   175 mcg Nebulization Daily   senna-docusate  2 tablet Oral BID   sodium chloride  flush  3 mL Intravenous Q12H   Continuous  Infusions: PRN Meds:.acetaminophen  **OR** acetaminophen , albuterol , bisacodyl , ondansetron  **OR** ondansetron  (ZOFRAN ) IV, oxyCODONE , traZODone   DVT Prophylaxis  heparin  injection 5,000 Units Start: 12/06/24 1400 Place TED hose Start: 12/06/24 0813 SCDs Start: 12/06/24 0646     Recent Labs  Lab 12/15/24 0346 12/17/24 0216 12/18/24 0253 12/19/24 0359 12/20/24 0331  WBC 15.0* 18.9* 24.0* 23.1* 22.6*  HGB 13.5 14.9 15.7 15.4 15.7  HCT 39.8 42.2 45.3 44.3 44.1  PLT 251 276 294 285 289  MCV 96.6 95.0 95.2 95.5 93.2  MCH 32.8 33.6 33.0 33.2 33.2  MCHC 33.9 35.3 34.7 34.8 35.6  RDW 12.7 12.7 12.7 12.9 12.8  LYMPHSABS 0.4* 0.5* 0.7 0.7 0.7  MONOABS 0.4 0.7 1.3* 1.2* 1.2*  EOSABS 0.0 0.0 0.0 0.0 0.0  BASOSABS 0.0 0.0 0.0 0.1 0.1    Recent Labs  Lab 12/14/24 9386 12/15/24 0346 12/16/24 0909 12/17/24 0216 12/18/24 0253 12/19/24  0359 12/20/24 0331  NA 134* 133* 130* 132* 131* 130* 129*  K 4.6 4.9 5.0 4.5 4.5 4.2 3.9  CL 97* 94* 93* 90* 91* 91* 91*  CO2 26 30 26 29 30 30 25   ANIONGAP 11 8 12 13 10 9 13   GLUCOSE 192* 244* 252* 189* 155* 145* 159*  BUN 30* 33* 39* 45* 45* 43* 41*  CREATININE 0.89 0.97 0.92 1.10 1.09 1.02 1.06  AST  --   --  13* 13* 14* 16  --   ALT  --   --  40 40 43 45*  --   ALKPHOS  --   --  65 66 65 62  --   BILITOT  --   --  0.7 0.7 0.8 0.9  --   ALBUMIN   --   --  3.2* 3.3* 3.5 3.4*  --   CRP <0.5  --   --  <0.5 <0.5 <0.5  --   PROCALCITON <0.10  --   --  0.10 0.12 0.17  --   MG 2.6* 2.5*  --  2.6* 2.8* 2.8* 2.5*  PHOS  --  3.5  --  4.1 4.3  --  4.5  CALCIUM  7.9* 8.4* 8.5* 8.4* 9.0 8.9 8.3*      Recent Labs  Lab 12/14/24 0613 12/15/24 0346 12/16/24 0909 12/17/24 0216 12/18/24 0253 12/19/24 0359 12/20/24 0331  CRP <0.5  --   --  <0.5 <0.5 <0.5  --   PROCALCITON <0.10  --   --  0.10 0.12 0.17  --   MG 2.6* 2.5*  --  2.6* 2.8* 2.8* 2.5*  CALCIUM  7.9* 8.4* 8.5* 8.4* 9.0 8.9 8.3*    ProBNP    Component Value Date/Time   PROBNP 112.0  12/19/2024 0359   PROBNP 338.7 (H) 09/21/2012 1455     ProBNP (last 3 results) Recent Labs    12/17/24 0216 12/18/24 0253 12/19/24 0359  PROBNP 152.0 122.0 112.0    --------------------------------------------------------------------------------------------------------------- Lab Results  Component Value Date   CHOL 141 02/03/2023   HDL 40 02/03/2023   LDLCALC 79 02/03/2023   TRIG 123 02/03/2023   CHOLHDL 4.7 12/05/2015    No results found for: HGBA1C No results for input(s): TSH, T4TOTAL, FREET4, T3FREE, THYROIDAB in the last 72 hours. No results for input(s): VITAMINB12, FOLATE, FERRITIN, TIBC, IRON, RETICCTPCT in the last 72 hours. ------------------------------------------------------------------------------------------------------------------ Cardiac Enzymes No results for input(s): CKMB, TROPONINI, MYOGLOBIN in the last 168 hours.  Invalid input(s): CK  Micro Results Recent Results (from the past 240 hours)  MRSA Next Gen by PCR, Nasal     Status: None   Collection Time: 12/12/24  5:59 AM   Specimen: Nasal Mucosa; Nasal Swab  Result Value Ref Range Status   MRSA by PCR Next Gen NOT DETECTED NOT DETECTED Final    Comment: (NOTE) The GeneXpert MRSA Assay (FDA approved for NASAL specimens only), is one component of a comprehensive MRSA colonization surveillance program. It is not intended to diagnose MRSA infection nor to guide or monitor treatment for MRSA infections. Test performance is not FDA approved in patients less than 74 years old. Performed at [facility], [address] [place], [place] [address]     Radiology Reports  CT Angio Chest Pulmonary Embolism (PE) W or WO Contrast Result Date: 12/19/2024 EXAM: CTA of the Chest with contrast for PE 12/19/2024 11:45:28 PM TECHNIQUE: CTA of the chest was performed without and with the administration of 75 mL iohexol  (OMNIPAQUE )  350 MG/ML injection. Multiplanar reformatted  images are provided for review. MIP images are provided for review. Automated exposure control, iterative reconstruction, and/or weight based adjustment of the mA/kV was utilized to reduce the radiation dose to as low as reasonably achievable. COMPARISON: CT angiogram chest 11/21/2024. CLINICAL HISTORY: Pulmonary embolism (PE) suspected, high probability. FINDINGS: PULMONARY ARTERIES: Pulmonary arteries are adequately opacified for evaluation. No pulmonary embolism. Main pulmonary artery is normal in caliber. MEDIASTINUM: The heart is mildly enlarged. Extensive atherosclerotic calcifications are seen throughout the aorta and coronary arteries similar to the prior study. LYMPH NODES: No mediastinal, hilar or axillary lymphadenopathy. LUNGS AND PLEURA: Moderate emphysema present. There is bilateral lower lobe airspace consolidation with additional patchy airspace opacities in the bilateral upper lobes, right greater than left. There is a calcified granuloma in the right upper lobe. No pleural effusion or pneumothorax. UPPER ABDOMEN: There is a small hiatal hernia. SOFT TISSUES AND BONES: No acute bone or soft tissue abnormality. IMPRESSION: 1. No evidence of pulmonary embolism. 2. Bilateral lower lobe airspace consolidation with additional patchy airspace opacities in the bilateral upper lobes, right greater than left. Findings are compatible with multifocal pneumonia. 3. Moderate emphysema. Electronically signed by: Greig Pique MD 12/19/2024 11:56 PM EST RP Workstation: [id number]       Signature  -   Brayton Lye M.D on 12/21/2024 at 6:08 AM   -  To page go to [email or web address]

## 2024-12-21 NOTE — Plan of Care (Signed)
" °  Problem: Education: Goal: Knowledge of General Education information will improve Description: Including pain rating scale, medication(s)/side effects and non-pharmacologic comfort measures 12/21/2024 1501 by Katrinka Avelina MATSU, RN Outcome: Progressing 12/21/2024 1500 by Katrinka Avelina MATSU, RN Outcome: Progressing   Problem: Health Behavior/Discharge Planning: Goal: Ability to manage health-related needs will improve Outcome: Progressing   Problem: Clinical Measurements: Goal: Ability to maintain clinical measurements within normal limits will improve Outcome: Progressing   Problem: Activity: Goal: Risk for activity intolerance will decrease Outcome: Progressing   Problem: Nutrition: Goal: Adequate nutrition will be maintained Outcome: Progressing   Problem: Coping: Goal: Level of anxiety will decrease Outcome: Progressing   Problem: Elimination: Goal: Will not experience complications related to bowel motility Outcome: Progressing Goal: Will not experience complications related to urinary retention Outcome: Progressing  Patient is alert and oriented x3 with HOH noted, better results with left ear of patient. Patient noted dyspnea with exertion, noted patient has a good appetite. Patient is calm, corporative. Patient is able to express needs/wants clearly, expressing appropriate coping management. ROM noted intact and appropriate of both upper and lower extremities. Patient transfers x1 assist from bed to chair. This RN offered to walk with patient; however, patient refused and requested to ambulate later this shift. Nursing will continue to follow patient's progress.  "

## 2024-12-21 NOTE — Progress Notes (Signed)
 "  NAME:  Brandon [name], MRN:  [id number], DOB:  Feb 24, 1941, LOS: 15 ADMISSION DATE:  12/06/2024, CONSULTATION DATE:  12/21/24 REFERRING MD:  Dr. Marcene, CHIEF COMPLAINT:  COPD exacerbation   History of Present Illness:   Brandon [name] is an 84 y/o M with a PMH significant for HTN, HLD, AAA w/ repair, Grade II COPD (FEV1 1.59 L, 55% predicted) who presents with shortness of breath, cough and acute hypoxic respiratory failure. Pulmonology consulted for evaluation and management of the latter.  He has a history of COPD, grade two, with multiple admissions for exacerbations. Shortness of breath started suddenly this morning around 1:30 AM. A similar episode occurred two weeks ago, but he thought he was improving until the current episode.  He denies using home oxygen and was not discharged with oxygen after previous hospitalizations. He experiences shortness of breath sometimes when lying flat and has noticed swelling in his feet and legs, which is worse than usual. No fever, chills, night sweats, or coughing up sputum or blood.  He uses an inhaler, Breztri , two puffs in the morning and two puffs in the evening, and rinses his mouth after use. He also has a nebulizer at home.  He quit smoking cigarettes 13 years ago. Pertinent  Medical History   Past Medical History:  Diagnosis Date   AAA (abdominal aortic aneurysm)    COPD (chronic obstructive pulmonary disease) (HCC)    DIC (disseminated intravascular coagulation) 09/09/2012   Hyperlipidemia    Hypertension    Leg wound, left 12/12/2013   Peripheral vascular disease    Respiratory failure, post-operative 09/09/2012   Shortness of breath    Significant Hospital Events: Including procedures, antibiotic start and stop dates in addition to other pertinent events   1/7 - 1/14 admitted for acute hypoxic respiratory failure secondary to COPD exacerbation.  Was able to be weaned down to room air at the time of the discharge. 1/22 presented  due to worsening shortness of breath especially when lying down.  Admitted to the hospitalist service.  PCCM was consulted.  Lactic acid elevated. 1/23 echocardiogram EF 55 to 60%, no WMA, no valvular abnormality, RV normal. 2/3 transition from [facility] to 8 L salter 2/4 placed back on HHFNC , CTA neg PE 2/6 Weaned and currently on 5L Interim History / Subjective:  Patient feels his breathing is slightly better, still on high oxygen requirement.    Objective    Blood pressure 124/88, pulse 84, temperature 98 F (36.7 C), temperature source Oral, resp. rate (!) 30, height 5' 11 (1.803 m), weight 108.4 kg, SpO2 100%.        Intake/Output Summary (Last 24 hours) at 12/21/2024 1303 Last data filed at 12/20/2024 2301 Gross per 24 hour  Intake 3 ml  Output 800 ml  Net -797 ml   Filed Weights   12/18/24 0439 12/19/24 0455 12/20/24 0500  Weight: 114.1 kg 113.6 kg 108.4 kg    Examination: General: Elderly obese male, sitting in the chair  Lungs:  Increased work of breathing, decreased air entry bilaterally, no wheezing  Heart: RRR, Nl S1, S2  Abdomen : soft, obese, NT, ND  Neuro: Alert and oriented , hard of hearing Very Hard of hearing.  LE: trace lower extremity edema      Latest Ref Rng & Units 10/16/2024    3:27 PM  PFT Results  FVC-Pre L 2.85   FVC-Predicted Pre % 69   FVC-Post L 2.69   FVC-Predicted Post % 66  Pre FEV1/FVC % % 56   Post FEV1/FCV % % 58   FEV1-Pre L 1.59   FEV1-Predicted Pre % 55   FEV1-Post L 1.57   DLCO uncorrected ml/min/mmHg 14.09   DLCO UNC% % 57   DLCO corrected ml/min/mmHg 14.09   DLCO COR %Predicted % 57   DLVA Predicted % 82     LABS    PULMONARY No results for input(s): PHART, PCO2ART, PO2ART, HCO3, TCO2, O2SAT in the last 168 hours.  Invalid input(s): PCO2, PO2  CBC Recent Labs  Lab 12/19/24 0359 12/20/24 0331 12/21/24 0604  HGB 15.4 15.7 15.6  HCT 44.3 44.1 43.7  WBC 23.1* 22.6* 23.5*  PLT 285 289 236     COAGULATION No results for input(s): INR in the last 168 hours.  CARDIAC  No results for input(s): TROPONINI in the last 168 hours. Recent Labs  Lab 12/16/24 0909 12/17/24 0216 12/18/24 0253 12/19/24 0359  PROBNP 172.0 152.0 122.0 112.0     CHEMISTRY Recent Labs  Lab 12/15/24 0346 12/16/24 0909 12/17/24 0216 12/18/24 0253 12/19/24 0359 12/20/24 0331 12/21/24 0604  NA 133*   < > 132* 131* 130* 129* 127*  K 4.9   < > 4.5 4.5 4.2 3.9 3.9  CL 94*   < > 90* 91* 91* 91* 91*  CO2 30   < > 29 30 30 25 26   GLUCOSE 244*   < > 189* 155* 145* 159* 133*  BUN 33*   < > 45* 45* 43* 41* 40*  CREATININE 0.97   < > 1.10 1.09 1.02 1.06 1.12  CALCIUM  8.4*   < > 8.4* 9.0 8.9 8.3* 8.5*  MG 2.5*  --  2.6* 2.8* 2.8* 2.5*  --   PHOS 3.5  --  4.1 4.3  --  4.5  --    < > = values in this interval not displayed.   Estimated Creatinine Clearance: 64.8 mL/min (by C-G formula based on SCr of 1.12 mg/dL).   LIVER Recent Labs  Lab 12/16/24 0909 12/17/24 0216 12/18/24 0253 12/19/24 0359  AST 13* 13* 14* 16  ALT 40 40 43 45*  ALKPHOS 65 66 65 62  BILITOT 0.7 0.7 0.8 0.9  PROT 5.5* 5.7* 5.9* 5.8*  ALBUMIN  3.2* 3.3* 3.5 3.4*     INFECTIOUS Recent Labs  Lab 12/17/24 0216 12/18/24 0253 12/19/24 0359  PROCALCITON 0.10 0.12 0.17     ENDOCRINE CBG (last 3)  Recent Labs    12/19/24 0751 12/20/24 0758 12/21/24 0817  GLUCAP 123* 138* 141*   Imaging: Chest x-ray 12/19/2024 Improved aeration at the left lung base compared to the chest x-ray from 12/16/2024, possible trace pleural effusions bilaterally   CT PE 12/19/2024 Bilateral lower lobe atelectasis versus infiltrates, no evidence of PE  PFTs 2025 with FEV1/FVC ratio of 56% with an FEV1 of 55% consistent with severe obstruction.  DLCO 57% consistent with parenchymal emphysematous lung disease    Echocardiogram 12/07/2024 with normal ejection fraction   Labs showing worsening hyponatremia   Resolved problem list    Assessment and Plan  BAseline COPD Gold stge 2: No home oxygen use or recent smoking Acute exacerbation of COPD  Acute hypoxemic respiratory failure: ?  Bibasilar pneumonia: Severe hypoxemia with minimal infiltrates on CT imaging with underlying baseline severe COPD with recurrent exacerbations.  Also obesity component playing a part.  Patient is improving on steroids.   - Continue LABA/LAMA/ICS as well as as needed albuterol  - Will plan for steroid taper  by 10 mg every 3 days - Goal SpO2 of 88 to 92%, please wean oxygen as tolerated - Intermittent diuresis per primary team -Hold off BiPAP -Pulmonary toilet with out of bed to chair as tolerated, flutter valve and incentive spirometer    PCCM to follow  Zola Herter, MD [facility] Office: [phone]   See Amion for personal pager PCCM on call pager [phone]  until 7pm. Please call Elink 7p-7a. [phone]    "

## 2025-01-15 ENCOUNTER — Ambulatory Visit

## 2025-02-20 ENCOUNTER — Ambulatory Visit

## 2025-02-20 ENCOUNTER — Encounter (HOSPITAL_COMMUNITY)
